# Patient Record
Sex: Female | Born: 1950
Health system: Southern US, Community
[De-identification: ages and names within clinical notes are randomized; demographics above are authoritative.]

## PROBLEM LIST (undated history)

## (undated) DIAGNOSIS — H269 Unspecified cataract: Secondary | ICD-10-CM

## (undated) DIAGNOSIS — F419 Anxiety disorder, unspecified: Secondary | ICD-10-CM

## (undated) DIAGNOSIS — Z860100 Personal history of colon polyps, unspecified: Secondary | ICD-10-CM

## (undated) DIAGNOSIS — H9190 Unspecified hearing loss, unspecified ear: Secondary | ICD-10-CM

## (undated) DIAGNOSIS — H9319 Tinnitus, unspecified ear: Secondary | ICD-10-CM

## (undated) DIAGNOSIS — G4700 Insomnia, unspecified: Secondary | ICD-10-CM

## (undated) DIAGNOSIS — T8859XA Other complications of anesthesia, initial encounter: Secondary | ICD-10-CM

## (undated) DIAGNOSIS — M2540 Effusion, unspecified joint: Secondary | ICD-10-CM

## (undated) DIAGNOSIS — H409 Unspecified glaucoma: Secondary | ICD-10-CM

## (undated) DIAGNOSIS — Z8719 Personal history of other diseases of the digestive system: Secondary | ICD-10-CM

## (undated) DIAGNOSIS — IMO0002 Reserved for concepts with insufficient information to code with codable children: Secondary | ICD-10-CM

## (undated) DIAGNOSIS — K219 Gastro-esophageal reflux disease without esophagitis: Secondary | ICD-10-CM

## (undated) DIAGNOSIS — Z9889 Other specified postprocedural states: Secondary | ICD-10-CM

## (undated) DIAGNOSIS — Z8709 Personal history of other diseases of the respiratory system: Secondary | ICD-10-CM

## (undated) DIAGNOSIS — R42 Dizziness and giddiness: Secondary | ICD-10-CM

## (undated) DIAGNOSIS — T4145XA Adverse effect of unspecified anesthetic, initial encounter: Secondary | ICD-10-CM

## (undated) DIAGNOSIS — M1990 Unspecified osteoarthritis, unspecified site: Secondary | ICD-10-CM

## (undated) DIAGNOSIS — M2550 Pain in unspecified joint: Secondary | ICD-10-CM

## (undated) DIAGNOSIS — D649 Anemia, unspecified: Secondary | ICD-10-CM

## (undated) DIAGNOSIS — R112 Nausea with vomiting, unspecified: Secondary | ICD-10-CM

## (undated) DIAGNOSIS — K5790 Diverticulosis of intestine, part unspecified, without perforation or abscess without bleeding: Secondary | ICD-10-CM

## (undated) DIAGNOSIS — F5104 Psychophysiologic insomnia: Secondary | ICD-10-CM

## (undated) DIAGNOSIS — I639 Cerebral infarction, unspecified: Secondary | ICD-10-CM

## (undated) DIAGNOSIS — K5900 Constipation, unspecified: Secondary | ICD-10-CM

## (undated) DIAGNOSIS — I10 Essential (primary) hypertension: Secondary | ICD-10-CM

## (undated) HISTORY — PX: OTHER SURGICAL HISTORY: SHX169

## (undated) HISTORY — PX: COLONOSCOPY: SHX174

## (undated) HISTORY — PX: ABDOMINAL HYSTERECTOMY: SHX81

## (undated) HISTORY — PX: APPENDECTOMY: SHX54

## (undated) HISTORY — DX: Psychophysiologic insomnia: F51.04

## (undated) HISTORY — PX: TUBAL LIGATION: SHX77

## (undated) HISTORY — DX: Reserved for concepts with insufficient information to code with codable children: IMO0002

## (undated) HISTORY — PX: TONSILLECTOMY: SUR1361

---

## 2002-07-11 ENCOUNTER — Encounter (INDEPENDENT_AMBULATORY_CARE_PROVIDER_SITE_OTHER): Payer: Self-pay | Admitting: Specialist

## 2002-07-11 ENCOUNTER — Ambulatory Visit (HOSPITAL_COMMUNITY): Admission: RE | Admit: 2002-07-11 | Discharge: 2002-07-11 | Payer: Self-pay | Admitting: Gastroenterology

## 2003-11-22 ENCOUNTER — Emergency Department (HOSPITAL_COMMUNITY): Admission: EM | Admit: 2003-11-22 | Discharge: 2003-11-22 | Payer: Self-pay | Admitting: Emergency Medicine

## 2007-01-23 ENCOUNTER — Encounter: Admission: RE | Admit: 2007-01-23 | Discharge: 2007-01-23 | Payer: Self-pay | Admitting: Sports Medicine

## 2008-10-25 ENCOUNTER — Encounter: Admission: RE | Admit: 2008-10-25 | Discharge: 2008-10-25 | Payer: Self-pay | Admitting: Internal Medicine

## 2009-07-29 ENCOUNTER — Emergency Department (HOSPITAL_COMMUNITY): Admission: EM | Admit: 2009-07-29 | Discharge: 2009-07-29 | Payer: Self-pay | Admitting: Emergency Medicine

## 2009-07-29 ENCOUNTER — Emergency Department (HOSPITAL_COMMUNITY): Admission: EM | Admit: 2009-07-29 | Discharge: 2009-07-30 | Payer: Self-pay | Admitting: Emergency Medicine

## 2010-02-15 ENCOUNTER — Emergency Department (HOSPITAL_COMMUNITY): Admission: EM | Admit: 2010-02-15 | Discharge: 2010-02-15 | Payer: Self-pay | Admitting: Emergency Medicine

## 2011-03-19 LAB — POCT I-STAT, CHEM 8
BUN: 12 mg/dL (ref 6–23)
Calcium, Ion: 1.14 mmol/L (ref 1.12–1.32)
Chloride: 106 mEq/L (ref 96–112)
Creatinine, Ser: 1 mg/dL (ref 0.4–1.2)
Glucose, Bld: 90 mg/dL (ref 70–99)
HCT: 39 % (ref 36.0–46.0)
Hemoglobin: 13.3 g/dL (ref 12.0–15.0)
Potassium: 4.1 mEq/L (ref 3.5–5.1)
Sodium: 139 mEq/L (ref 135–145)
TCO2: 28 mmol/L (ref 0–100)

## 2011-04-03 LAB — CBC
HCT: 37.9 % (ref 36.0–46.0)
Hemoglobin: 13 g/dL (ref 12.0–15.0)
MCHC: 34.2 g/dL (ref 30.0–36.0)
MCV: 86.4 fL (ref 78.0–100.0)
Platelets: 112 10*3/uL — ABNORMAL LOW (ref 150–400)
RBC: 4.39 MIL/uL (ref 3.87–5.11)
RDW: 13.1 % (ref 11.5–15.5)
WBC: 6.6 10*3/uL (ref 4.0–10.5)

## 2011-04-03 LAB — COMPREHENSIVE METABOLIC PANEL
ALT: 15 U/L (ref 0–35)
AST: 20 U/L (ref 0–37)
Albumin: 3.3 g/dL — ABNORMAL LOW (ref 3.5–5.2)
Alkaline Phosphatase: 88 U/L (ref 39–117)
BUN: 13 mg/dL (ref 6–23)
CO2: 27 mEq/L (ref 19–32)
Calcium: 9 mg/dL (ref 8.4–10.5)
Chloride: 109 mEq/L (ref 96–112)
Creatinine, Ser: 0.88 mg/dL (ref 0.4–1.2)
GFR calc Af Amer: >60 mL/min (ref >60)
GFR calc non Af Amer: >60 mL/min (ref >60)
Glucose, Bld: 97 mg/dL (ref 70–99)
Potassium: 3.8 mEq/L (ref 3.5–5.1)
Sodium: 141 mEq/L (ref 135–145)
Total Bilirubin: 0.4 mg/dL (ref 0.3–1.2)
Total Protein: 6.6 g/dL (ref 6.0–8.3)

## 2011-04-03 LAB — DIFFERENTIAL
Basophils Absolute: 0 10*3/uL (ref 0.0–0.1)
Basophils Relative: 1 % (ref 0–1)
Eosinophils Absolute: 0.1 10*3/uL (ref 0.0–0.7)
Eosinophils Relative: 2 % (ref 0–5)
Lymphocytes Relative: 32 % (ref 12–46)
Lymphs Abs: 2.1 10*3/uL (ref 0.7–4.0)
Monocytes Absolute: 0.5 10*3/uL (ref 0.1–1.0)
Monocytes Relative: 7 % (ref 3–12)
Neutro Abs: 3.9 10*3/uL (ref 1.7–7.7)
Neutrophils Relative %: 59 % (ref 43–77)

## 2011-04-03 LAB — POCT CARDIAC MARKERS
CKMB, poc: <1 ng/mL — ABNORMAL LOW (ref 1.0–8.0)
Myoglobin, poc: 60.2 ng/mL (ref 12–200)
Troponin i, poc: <0.05 ng/mL (ref 0.00–0.09)

## 2011-05-14 NOTE — Op Note (Signed)
[place]  Patient:    Gabrielle [name], Gabrielle [name] Visit Number: [id number] MRN: [id number]          Service Type: END Location: ENDO Attending Physician:  Charna Elizabeth Dictated by:   Anselmo Rod, M.D. Proc. Date: 07/11/02 Admit Date:  07/11/2002 Discharge Date: 07/11/2002   CC:         Cala Bradford R. Renae Gloss, M.D.   Operative Report  DATE OF BIRTH:  03-13-1951  PROCEDURE PERFORMED:  Colonoscopy with snare polypectomy x 2.  ENDOSCOPIST:  Anselmo Rod, M.D.  INSTRUMENT:  Olympus video colonoscope.  INDICATION FOR PROCEDURE:  A 60 year old African-American female undergoing screening colonoscopy.  The patients father has a history of colon cancer, and sister has had breast cancer.  Rule out colonic polyps.  PREPROCEDURE PREPARATION:  Informed consent was procured from the patient. The patient was fasting for eight hours prior to the procedure and prepped with a bottle of magnesium citrate and a gallon of NuLytely the night prior to the procedure.  PREPROCEDURE PHYSICAL:  VITAL SIGNS:  Stable.  NECK:  Supple.  CHEST:  Clear to auscultation.  HEART:  S1, S2 regular.  ABDOMEN:  Soft with normal bowel sounds.  DESCRIPTION OF THE PROCEDURE:  The patient was placed in the left lateral decubitus position and sedated with 100 mg of Demerol and 15 mg of Versed intravenously.  Once the patient was adequately sedated and maintained on low-flow oxygen and continuous cardiac monitoring, the Olympus video colonoscope was advanced from the rectum to the cecum with difficulty secondary to the patients large body habitus.  A 5 to 6 mm sessile polyp was snared at 10 cm.  Another polyp was snared from the same site which measured about 54 mm in size.  There were a few hyperplastic appearing polyps at 10 cm ablated with the tip of the snare.  Small internal hemorrhoids were seen on retroflexion in the rectum.  The patient tolerated the procedure  well without complication.  The procedure was completed at the cecum.  The appendical orifice and the cecal valve were clearly visualized and photographed.  IMPRESSION: 1. Small nonbleeding internal hemorrhoids. 2. A 5 to 6 mm sessile polyp snared at 10 cm. 3. Small sessile polyp measuring 2 to 4 mm snared at 10 cm as well. 4. A few hyperplastic appearing polyps ablated between 10 to 15 cm.  RECOMMENDATIONS: 1. Avoid all nonsteroidals including aspirin for now. 2. Await pathology results. 3. Outpatient followup in the next 7 to 10 days. Dictated by:   Anselmo Rod, M.D. Attending Physician:  Charna Elizabeth DD:  07/11/02 TD:  07/16/02 Job: [id number]

## 2012-02-05 ENCOUNTER — Telehealth: Payer: Self-pay | Admitting: Internal Medicine

## 2012-02-05 NOTE — Telephone Encounter (Signed)
Abd cramping last night after dinner, better after BM.  Initial stool was normal in color. Cramps continued throughout the night, then this am bright red per rectum About 3 episodes since this am. Has dicyclomine but she hasn't used it  No def hx of colitis.   ? Ischemic event diverticular bleeding, less likely IBD given the quick onset  I have advised ED visit today.  She can use dicyclomine for cramping. She voiced understanding and thanked me for the call.  I will fax note to Dr. Loreta Ave her primary GI MD

## 2012-02-07 NOTE — Telephone Encounter (Addendum)
Faxed note to Dr Kenna Gilbert ofc.

## 2012-06-13 ENCOUNTER — Emergency Department (HOSPITAL_COMMUNITY): Payer: No Typology Code available for payment source

## 2012-06-13 ENCOUNTER — Encounter (HOSPITAL_COMMUNITY): Payer: Self-pay | Admitting: Emergency Medicine

## 2012-06-13 ENCOUNTER — Emergency Department (HOSPITAL_COMMUNITY)
Admission: EM | Admit: 2012-06-13 | Discharge: 2012-06-14 | Disposition: A | Discharge status: Home / Self Care | Payer: No Typology Code available for payment source | Attending: Emergency Medicine | Admitting: Emergency Medicine

## 2012-06-13 DIAGNOSIS — S298XXA Other specified injuries of thorax, initial encounter: Secondary | ICD-10-CM | POA: Insufficient documentation

## 2012-06-13 DIAGNOSIS — S8000XA Contusion of unspecified knee, initial encounter: Secondary | ICD-10-CM | POA: Insufficient documentation

## 2012-06-13 DIAGNOSIS — Y998 Other external cause status: Secondary | ICD-10-CM | POA: Insufficient documentation

## 2012-06-13 DIAGNOSIS — S8001XA Contusion of right knee, initial encounter: Secondary | ICD-10-CM

## 2012-06-13 DIAGNOSIS — S8002XA Contusion of left knee, initial encounter: Secondary | ICD-10-CM

## 2012-06-13 DIAGNOSIS — R0789 Other chest pain: Secondary | ICD-10-CM

## 2012-06-13 DIAGNOSIS — Y93I9 Activity, other involving external motion: Secondary | ICD-10-CM | POA: Insufficient documentation

## 2012-06-13 HISTORY — DX: Essential (primary) hypertension: I10

## 2012-06-13 MED ORDER — CYCLOBENZAPRINE HCL 10 MG PO TABS: 10.0000 mg | ORAL_TABLET | Freq: Two times a day (BID) | ORAL | Status: AC | PRN

## 2012-06-13 MED ORDER — DIAZEPAM 5 MG PO TABS
5.0000 mg | ORAL_TABLET | Freq: Once | ORAL | Status: DC
  Filled 2012-06-13 (×2): qty 1

## 2012-06-13 MED ORDER — HYDROCODONE-ACETAMINOPHEN 5-325 MG PO TABS
1.0000 | ORAL_TABLET | Freq: Once | ORAL | Status: AC
  Administered 2012-06-13: 1 via ORAL
  Filled 2012-06-13 (×2): qty 1

## 2012-06-13 MED ORDER — HYDROCODONE-ACETAMINOPHEN 5-325 MG PO TABS: 1.0000 | ORAL_TABLET | ORAL | Status: AC | PRN
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
## 2012-06-13 NOTE — ED Notes (Signed)
Upon entering room for RN assessment, pt had c-collar on bed. Pt asked who removed c-collar, pt states "i did, i have panic attacks and it was bothering me". Pt informed of risk and benefits of having c-collar in place, pt verbalized understanding and c-collar was placed back on pt neck by RN. Family at bedside and plan of care is updated with verbal understanding.

## 2012-06-13 NOTE — Discharge Instructions (Signed)
Chest Wall Pain Chest wall pain is pain in or around the bones and muscles of your chest. It may take up to 6 weeks to get better. It may take longer if you must stay physically active in your work and activities.  CAUSES  Chest wall pain may happen on its own. However, it may be caused by:  A viral illness like the flu.   Injury.   Coughing.   Exercise.   Arthritis.   Fibromyalgia.   Shingles.  HOME CARE INSTRUCTIONS   Avoid overtiring physical activity. Try not to strain or perform activities that cause pain. This includes any activities using your chest or your abdominal and side muscles, especially if heavy weights are used.   Put ice on the sore area.   Put ice in a plastic bag.   Place a towel between your skin and the bag.   Leave the ice on for 15 to 20 minutes per hour while awake for the first 2 days.   Only take over-the-counter or prescription medicines for pain, discomfort, or fever as directed by your caregiver.  SEEK IMMEDIATE MEDICAL CARE IF:   Your pain increases, or you are very uncomfortable.   You have a fever.   Your chest pain becomes worse.   You have new, unexplained symptoms.   You have nausea or vomiting.   You feel sweaty or lightheaded.   You have a cough with phlegm (sputum), or you cough up blood.  MAKE SURE YOU:   Understand these instructions.   Will watch your condition.   Will get help right away if you are not doing well or get worse.  Document Released: 12/13/2005 Document Revised: 12/02/2011 Document Reviewed: 08/09/2011 [facility] Patient Information 2012 [place], [place].Contusion A contusion is a deep bruise. Contusions are the result of an injury that caused bleeding under the skin. The contusion may turn blue, purple, or yellow. Minor injuries will give you a painless contusion, but more severe contusions may stay painful and swollen for a few weeks.  CAUSES  A contusion is usually caused by a blow, trauma, or direct  force to an area of the body. SYMPTOMS   Swelling and redness of the injured area.   Bruising of the injured area.   Tenderness and soreness of the injured area.   Pain.  DIAGNOSIS  The diagnosis can be made by taking a history and physical exam. An X-ray, CT scan, or MRI may be needed to determine if there were any associated injuries, such as fractures. TREATMENT  Specific treatment will depend on what area of the body was injured. In general, the best treatment for a contusion is resting, icing, elevating, and applying cold compresses to the injured area. Over-the-counter medicines may also be recommended for pain control. Ask your caregiver what the best treatment is for your contusion. HOME CARE INSTRUCTIONS   Put ice on the injured area.   Put ice in a plastic bag.   Place a towel between your skin and the bag.   Leave the ice on for 15 to 20 minutes, 3 to 4 times a day.   Only take over-the-counter or prescription medicines for pain, discomfort, or fever as directed by your caregiver. Your caregiver may recommend avoiding anti-inflammatory medicines (aspirin, ibuprofen, and naproxen) for 48 hours because these medicines may increase bruising.   Rest the injured area.   If possible, elevate the injured area to reduce swelling.  SEEK IMMEDIATE MEDICAL CARE IF:   You have  increased bruising or swelling.   You have pain that is getting worse.   Your swelling or pain is not relieved with medicines.  MAKE SURE YOU:   Understand these instructions.   Will watch your condition.   Will get help right away if you are not doing well or get worse.  Document Released: 09/22/2005 Document Revised: 12/02/2011 Document Reviewed: 10/18/2011 [employer] Patient Information 2012 [place], [place].
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
## 2012-06-16 NOTE — ED Provider Notes (Signed)
Medical screening examination/treatment/procedure(s) were performed by non-physician practitioner and as supervising physician I was immediately available for consultation/collaboration.   Akylah Hascall, MD 06/16/12 1613 

## 2012-11-22 ENCOUNTER — Other Ambulatory Visit: Payer: Self-pay | Admitting: Internal Medicine

## 2012-11-22 DIAGNOSIS — H9319 Tinnitus, unspecified ear: Secondary | ICD-10-CM

## 2012-11-28 ENCOUNTER — Ambulatory Visit
Admission: RE | Admit: 2012-11-28 | Discharge: 2012-11-28 | Disposition: A | Discharge status: Home / Self Care | Payer: BC Managed Care – PPO | Source: Ambulatory Visit | Attending: Internal Medicine | Admitting: Internal Medicine

## 2012-11-28 DIAGNOSIS — H9319 Tinnitus, unspecified ear: Secondary | ICD-10-CM

## 2013-02-19 ENCOUNTER — Other Ambulatory Visit: Payer: Self-pay | Admitting: Otolaryngology

## 2013-02-19 DIAGNOSIS — H9311 Tinnitus, right ear: Secondary | ICD-10-CM

## 2013-02-20 ENCOUNTER — Ambulatory Visit
Admission: RE | Admit: 2013-02-20 | Discharge: 2013-02-20 | Disposition: A | Discharge status: Home / Self Care | Payer: BC Managed Care – PPO | Source: Ambulatory Visit | Attending: Otolaryngology | Admitting: Otolaryngology

## 2013-02-20 DIAGNOSIS — H9311 Tinnitus, right ear: Secondary | ICD-10-CM

## 2013-05-29 ENCOUNTER — Encounter (HOSPITAL_COMMUNITY): Payer: Self-pay | Admitting: Emergency Medicine

## 2013-05-29 ENCOUNTER — Emergency Department (HOSPITAL_COMMUNITY)
Admission: EM | Admit: 2013-05-29 | Discharge: 2013-05-29 | Disposition: A | Discharge status: Home / Self Care | Payer: BC Managed Care – PPO | Attending: Emergency Medicine | Admitting: Emergency Medicine

## 2013-05-29 ENCOUNTER — Emergency Department (HOSPITAL_COMMUNITY): Payer: BC Managed Care – PPO

## 2013-05-29 DIAGNOSIS — I10 Essential (primary) hypertension: Secondary | ICD-10-CM | POA: Insufficient documentation

## 2013-05-29 DIAGNOSIS — Y9289 Other specified places as the place of occurrence of the external cause: Secondary | ICD-10-CM | POA: Insufficient documentation

## 2013-05-29 DIAGNOSIS — M25561 Pain in right knee: Secondary | ICD-10-CM

## 2013-05-29 DIAGNOSIS — X500XXA Overexertion from strenuous movement or load, initial encounter: Secondary | ICD-10-CM | POA: Insufficient documentation

## 2013-05-29 DIAGNOSIS — Z79899 Other long term (current) drug therapy: Secondary | ICD-10-CM | POA: Insufficient documentation

## 2013-05-29 DIAGNOSIS — M129 Arthropathy, unspecified: Secondary | ICD-10-CM | POA: Insufficient documentation

## 2013-05-29 DIAGNOSIS — S8990XA Unspecified injury of unspecified lower leg, initial encounter: Secondary | ICD-10-CM | POA: Insufficient documentation

## 2013-05-29 DIAGNOSIS — Y9389 Activity, other specified: Secondary | ICD-10-CM | POA: Insufficient documentation

## 2013-05-29 DIAGNOSIS — M25559 Pain in unspecified hip: Secondary | ICD-10-CM | POA: Insufficient documentation

## 2013-05-29 DIAGNOSIS — G8929 Other chronic pain: Secondary | ICD-10-CM | POA: Insufficient documentation

## 2013-05-29 MED ORDER — DIAZEPAM 5 MG PO TABS: 5.0000 mg | ORAL_TABLET | Freq: Two times a day (BID) | ORAL | Status: DC

## 2013-05-29 MED ORDER — IBUPROFEN 800 MG PO TABS
800.0000 mg | ORAL_TABLET | Freq: Once | ORAL | Status: AC
  Administered 2013-05-29: 800 mg via ORAL
  Filled 2013-05-29: qty 1

## 2013-05-29 MED ORDER — TRAMADOL HCL 50 MG PO TABS: 50.0000 mg | ORAL_TABLET | Freq: Three times a day (TID) | ORAL | Status: DC | PRN

## 2013-05-29 NOTE — ED Notes (Addendum)
Pt states hx of arthritis in her knees and hips.  C/o right knee and left hip pain x 1 month.  States that she has gotten cortisone shots before.  Denies injury.

## 2013-05-29 NOTE — ED Provider Notes (Signed)
History     CSN: [id number]  Arrival date & time 05/29/13  0803   First MD Initiated Contact with Patient 05/29/13 0831      Chief Complaint  Patient presents with  . Arthritis  . Knee Pain  . Hip Pain    (Consider location/radiation/quality/duration/timing/severity/associated sxs/prior treatment) HPI  Patient presents with concern of pain in her right knee, left hip. She has a lengthy history of chronic pain in both knees, as well as recent in the left hip.  The left hip pain is lateral, superior, worse with ambulation, sore, improved with ibuprofen. The bilateral knee pain has been chronic for years, improved with interval steroid shots. Yesterday, the patient had an episode of the right knee giving way while she was standing.  Since that time she said pain diffusely about the knee, worse with weightbearing or ambulation.  The pain is marginally improved with ibuprofen. No distal dysesthesia or weakness.   Past Medical History  Diagnosis Date  . Hypertension     Past Surgical History  Procedure Laterality Date  . Abdominal hysterectomy      History reviewed. No pertinent family history.  History  Substance Use Topics  . Smoking status: Never Smoker   . Smokeless tobacco: Not on file  . Alcohol Use: No    OB History   Grav Para Term Preterm Abortions TAB SAB Ect Mult Living                  Review of Systems  Constitutional:       Per HPI, otherwise negative  HENT:       Patient has right ear tinnitus, for which she has seen multiple ENT physicians, is currently in process of receiving additional evaluation for this entity  Respiratory:       Per HPI, otherwise negative  Cardiovascular:       Per HPI, otherwise negative  Gastrointestinal: Negative for nausea.  Genitourinary:       No incontinence  Musculoskeletal:       Per HPI, otherwise negative  Skin: Negative.   Neurological: Negative for weakness.    Allergies  Contrast media  Home  Medications   Current Outpatient Rx  Name  Route  Sig  Dispense  Refill  . lisinopril (PRINIVIL,ZESTRIL) 20 MG tablet   Oral   Take 20 mg by mouth daily.           BP 125/61  Pulse 66  Temp(Src) 98.4 F (36.9 C) (Oral)  Resp 16  SpO2 100%  Physical Exam  Nursing note and vitals reviewed. Constitutional: She is oriented to person, place, and time. She appears well-developed and well-nourished. No distress.  HENT:  Head: Normocephalic and atraumatic.  Eyes: Conjunctivae and EOM are normal.  Cardiovascular: Normal rate, regular rhythm, intact distal pulses and normal pulses.   Pulmonary/Chest: Effort normal and breath sounds normal. No stridor. No respiratory distress.  Abdominal: She exhibits no distension.  Musculoskeletal: She exhibits no edema.       Right hip: Normal.       Left hip: She exhibits tenderness and bony tenderness. She exhibits normal range of motion, normal strength, no swelling, no crepitus, no deformity and no laceration.       Right knee: She exhibits decreased range of motion, swelling, effusion and bony tenderness. She exhibits no ecchymosis, no deformity, no laceration, no erythema, normal alignment, no LCL laxity, normal patellar mobility, normal meniscus and no MCL laxity. Tenderness found. Medial joint  line and lateral joint line tenderness noted. No MCL, no LCL and no patellar tendon tenderness noted.       Left knee: Normal.       Right ankle: Normal.       Left ankle: Normal.  Neurological: She is alert and oriented to person, place, and time. No cranial nerve deficit.  Skin: Skin is warm and dry.  Psychiatric: She has a normal mood and affect.    ED Course  Procedures (including critical care time)  Labs Reviewed - No data to display No results found.   No diagnosis found.  We evaluated the x-rays together, interpreted.  No acute fracture, but degenerative changes. MDM  Patient presents with ongoing left hip pain, worsening right knee  pain following the knee giving way yesterday. On exam the patient is neurovascularly intact, though she is tenderness to palpation about the knee and hip.  X-rays are largely reassuring.  The patient has an orthopedist with whom she may follow up. The patient was discharged in stable condition with initiation of an analgesic regimen for a short time while she follows up with her physicians.    Gerhard Munch, MD 05/29/13 [phone]

## 2013-09-21 ENCOUNTER — Other Ambulatory Visit: Payer: Self-pay | Admitting: Sports Medicine

## 2013-09-21 DIAGNOSIS — M25561 Pain in right knee: Secondary | ICD-10-CM

## 2013-09-30 ENCOUNTER — Ambulatory Visit
Admission: RE | Admit: 2013-09-30 | Discharge: 2013-09-30 | Disposition: A | Discharge status: Home / Self Care | Payer: BC Managed Care – PPO | Source: Ambulatory Visit | Attending: Sports Medicine | Admitting: Sports Medicine

## 2013-09-30 DIAGNOSIS — M25561 Pain in right knee: Secondary | ICD-10-CM

## 2013-10-03 LAB — HM COLONOSCOPY

## 2013-10-04 ENCOUNTER — Other Ambulatory Visit: Payer: BC Managed Care – PPO

## 2013-11-27 DIAGNOSIS — H903 Sensorineural hearing loss, bilateral: Secondary | ICD-10-CM | POA: Insufficient documentation

## 2014-01-28 ENCOUNTER — Telehealth: Payer: Self-pay | Admitting: Diagnostic Neuroimaging

## 2014-01-28 NOTE — Telephone Encounter (Signed)
Spoke with patient and she said that she has had the tinnitus surgery(01/04/12 in [place]) did not help with the clicking in the ears-made it worse, now  having a buzzing in her forehead since surgery. What would be his recommendations at this point?  LOV was 01/30/13

## 2014-01-28 NOTE — Telephone Encounter (Signed)
NEEDS TO DISCUSS MRI RESULTS

## 2014-02-12 ENCOUNTER — Emergency Department (HOSPITAL_COMMUNITY)
Admission: EM | Admit: 2014-02-12 | Discharge: 2014-02-12 | Disposition: A | Discharge status: Home / Self Care | Payer: BC Managed Care – PPO | Source: Home / Self Care

## 2014-02-12 ENCOUNTER — Encounter (HOSPITAL_COMMUNITY): Payer: Self-pay | Admitting: Emergency Medicine

## 2014-02-12 DIAGNOSIS — H6980 Other specified disorders of Eustachian tube, unspecified ear: Secondary | ICD-10-CM

## 2014-02-12 DIAGNOSIS — J029 Acute pharyngitis, unspecified: Secondary | ICD-10-CM

## 2014-02-12 DIAGNOSIS — J069 Acute upper respiratory infection, unspecified: Secondary | ICD-10-CM

## 2014-02-12 LAB — POCT RAPID STREP A: Streptococcus, Group A Screen (Direct): NEGATIVE

## 2014-02-12 NOTE — ED Notes (Signed)
pT  HAS  SYMPTOMS  OF  SORETHROAT  WITH  BODY  ACHES  /  CHILLS        FOR SEV  DAYS

## 2014-02-12 NOTE — Discharge Instructions (Signed)
Pharyngitis °Pharyngitis is redness, pain, and swelling (inflammation) of your pharynx.  °CAUSES  °Pharyngitis is usually caused by infection. Most of the time, these infections are from viruses (viral) and are part of a cold. However, sometimes pharyngitis is caused by bacteria (bacterial). Pharyngitis can also be caused by allergies. Viral pharyngitis may be spread from person to person by coughing, sneezing, and personal items or utensils (cups, forks, spoons, toothbrushes). Bacterial pharyngitis may be spread from person to person by more intimate contact, such as kissing.  °SIGNS AND SYMPTOMS  °Symptoms of pharyngitis include:   °· Sore throat.   °· Tiredness (fatigue).   °· Low-grade fever.   °· Headache. °· Joint pain and muscle aches. °· Skin rashes. °· Swollen lymph nodes. °· Plaque-like film on throat or tonsils (often seen with bacterial pharyngitis). °DIAGNOSIS  °Your health care provider will ask you questions about your illness and your symptoms. Your medical history, along with a physical exam, is often all that is needed to diagnose pharyngitis. Sometimes, a rapid strep test is done. Other lab tests may also be done, depending on the suspected cause.  °TREATMENT  °Viral pharyngitis will usually get better in 3 4 days without the use of medicine. Bacterial pharyngitis is treated with medicines that kill germs (antibiotics).  °HOME CARE INSTRUCTIONS  °· Drink enough water and fluids to keep your urine clear or pale yellow.   °· Only take over-the-counter or prescription medicines as directed by your health care provider:   °· If you are prescribed antibiotics, make sure you finish them even if you start to feel better.   °· Do not take aspirin.   °· Get lots of rest.   °· Gargle with 8 oz of salt water (½ tsp of salt per 1 qt of water) as often as every 1 2 hours to soothe your throat.   °· Throat lozenges (if you are not at risk for choking) or sprays may be used to soothe your throat. °SEEK MEDICAL  CARE IF:  °· You have large, tender lumps in your neck. °· You have a rash. °· You cough up green, yellow-brown, or bloody spit. °SEEK IMMEDIATE MEDICAL CARE IF:  °· Your neck becomes stiff. °· You drool or are unable to swallow liquids. °· You vomit or are unable to keep medicines or liquids down. °· You have severe pain that does not go away with the use of recommended medicines. °· You have trouble breathing (not caused by a stuffy nose). °MAKE SURE YOU:  °· Understand these instructions. °· Will watch your condition. °· Will get help right away if you are not doing well or get worse. °Document Released: 12/13/2005 Document Revised: 10/03/2013 Document Reviewed: 08/20/2013 °[employer]® Patient Information ©2014 [employer]. ° °Sore Throat °A sore throat is pain, burning, irritation, or scratchiness of the throat. There is often pain or tenderness when swallowing or talking. A sore throat may be accompanied by other symptoms, such as coughing, sneezing, fever, and swollen neck glands. A sore throat is often the first sign of another sickness, such as a cold, flu, strep throat, or mononucleosis (commonly known as mono). Most sore throats go away without medical treatment. °CAUSES  °The most common causes of a sore throat include: °· A viral infection, such as a cold, flu, or mono. °· A bacterial infection, such as strep throat, tonsillitis, or whooping cough. °· Seasonal allergies. °· Dryness in the air. °· Irritants, such as smoke or pollution. °· Gastroesophageal reflux disease (GERD). °HOME CARE INSTRUCTIONS  °· Only take over-the-counter   medicines as directed by your caregiver.  Drink enough fluids to keep your urine clear or pale yellow.  Rest as needed.  Try using throat sprays, lozenges, or sucking on hard candy to ease any pain (if older than 4 years or as directed).  Sip warm liquids, such as broth, herbal tea, or warm water with honey to relieve pain temporarily. You may also eat or drink cold or  frozen liquids such as frozen ice pops.  Gargle with salt water (mix 1 tsp salt with 8 oz of water).  Do not smoke and avoid secondhand smoke.  Put a cool-mist humidifier in your bedroom at night to moisten the air. You can also turn on a hot shower and sit in the bathroom with the door closed for 5 10 minutes. SEEK IMMEDIATE MEDICAL CARE IF:  You have difficulty breathing.  You are unable to swallow fluids, soft foods, or your saliva.  You have increased swelling in the throat.  Your sore throat does not get better in 7 days.  You have nausea and vomiting.  You have a fever or persistent symptoms for more than 2 3 days.  You have a fever and your symptoms suddenly get worse. MAKE SURE YOU:   Understand these instructions.  Will watch your condition.  Will get help right away if you are not doing well or get worse. Document Released: 01/20/2005 Document Revised: 11/29/2012 Document Reviewed: 08/20/2012 [facility] Patient Information 2014 [place], [place].  Upper Respiratory Infection, Adult An upper respiratory infection (URI) is also sometimes known as the common cold. The upper respiratory tract includes the nose, sinuses, throat, trachea, and bronchi. Bronchi are the airways leading to the lungs. Most people improve within 1 week, but symptoms can last up to 2 weeks. A residual cough may last even longer.  CAUSES Many different viruses can infect the tissues lining the upper respiratory tract. The tissues become irritated and inflamed and often become very moist. Mucus production is also common. A cold is contagious. You can easily spread the virus to others by oral contact. This includes kissing, sharing a glass, coughing, or sneezing. Touching your mouth or nose and then touching a surface, which is then touched by another person, can also spread the virus. SYMPTOMS  Symptoms typically develop 1 to 3 days after you come in contact with a cold virus. Symptoms vary from person to  person. They may include:  Runny nose.  Sneezing.  Nasal congestion.  Sinus irritation.  Sore throat.  Loss of voice (laryngitis).  Cough.  Fatigue.  Muscle aches.  Loss of appetite.  Headache.  Low-grade fever. DIAGNOSIS  You might diagnose your own cold based on familiar symptoms, since most people get a cold 2 to 3 times a year. Your caregiver can confirm this based on your exam. Most importantly, your caregiver can check that your symptoms are not due to another disease such as strep throat, sinusitis, pneumonia, asthma, or epiglottitis. Blood tests, throat tests, and X-rays are not necessary to diagnose a common cold, but they may sometimes be helpful in excluding other more serious diseases. Your caregiver will decide if any further tests are required. RISKS AND COMPLICATIONS  You may be at risk for a more severe case of the common cold if you smoke cigarettes, have chronic heart disease (such as heart failure) or lung disease (such as asthma), or if you have a weakened immune system. The very young and very old are also at risk for more serious infections.  Bacterial sinusitis, middle ear infections, and bacterial pneumonia can complicate the common cold. The common cold can worsen asthma and chronic obstructive pulmonary disease (COPD). Sometimes, these complications can require emergency medical care and may be life-threatening. PREVENTION  The best way to protect against getting a cold is to practice good hygiene. Avoid oral or hand contact with people with cold symptoms. Wash your hands often if contact occurs. There is no clear evidence that vitamin C, vitamin E, echinacea, or exercise reduces the chance of developing a cold. However, it is always recommended to get plenty of rest and practice good nutrition. TREATMENT  Treatment is directed at relieving symptoms. There is no cure. Antibiotics are not effective, because the infection is caused by a virus, not by bacteria.  Treatment may include:  Increased fluid intake. Sports drinks offer valuable electrolytes, sugars, and fluids.  Breathing heated mist or steam (vaporizer or shower).  Eating chicken soup or other clear broths, and maintaining good nutrition.  Getting plenty of rest.  Using gargles or lozenges for comfort.  Controlling fevers with ibuprofen or acetaminophen as directed by your caregiver.  Increasing usage of your inhaler if you have asthma. Zinc gel and zinc lozenges, taken in the first 24 hours of the common cold, can shorten the duration and lessen the severity of symptoms. Pain medicines may help with fever, muscle aches, and throat pain. A variety of non-prescription medicines are available to treat congestion and runny nose. Your caregiver can make recommendations and may suggest nasal or lung inhalers for other symptoms.  HOME CARE INSTRUCTIONS   Only take over-the-counter or prescription medicines for pain, discomfort, or fever as directed by your caregiver.  Use a warm mist humidifier or inhale steam from a shower to increase air moisture. This may keep secretions moist and make it easier to breathe.  Drink enough water and fluids to keep your urine clear or pale yellow.  Rest as needed.  Return to work when your temperature has returned to normal or as your caregiver advises. You may need to stay home longer to avoid infecting others. You can also use a face mask and careful hand washing to prevent spread of the virus. SEEK MEDICAL CARE IF:   After the first few days, you feel you are getting worse rather than better.  You need your caregiver's advice about medicines to control symptoms.  You develop chills, worsening shortness of breath, or brown or red sputum. These may be signs of pneumonia.  You develop yellow or brown nasal discharge or pain in the face, especially when you bend forward. These may be signs of sinusitis.  You develop a fever, swollen neck glands, pain  with swallowing, or white areas in the back of your throat. These may be signs of strep throat. SEEK IMMEDIATE MEDICAL CARE IF:   You have a fever.  You develop severe or persistent headache, ear pain, sinus pain, or chest pain.  You develop wheezing, a prolonged cough, cough up blood, or have a change in your usual mucus (if you have chronic lung disease).  You develop sore muscles or a stiff neck. Document Released: 06/08/2001 Document Revised: 03/06/2012 Document Reviewed: 04/16/2011 [facility] Patient Information 2014 [place], [place].

## 2014-02-12 NOTE — ED Provider Notes (Signed)
CSN: [id number]     Arrival date & time 02/12/14  1238 History   First MD Initiated Contact with Patient 02/12/14 1333     Chief Complaint  Patient presents with  . Sore Throat     (Consider location/radiation/quality/duration/timing/severity/associated sxs/prior Treatment) HPI Comments: 63 y o f with sore throat, body aches, general weakness, PND, stuffy nose. No fever. Not taking meds for sx's    Past Medical History  Diagnosis Date  . Hypertension    Past Surgical History  Procedure Laterality Date  . Abdominal hysterectomy     History reviewed. No pertinent family history. History  Substance Use Topics  . Smoking status: Never Smoker   . Smokeless tobacco: Not on file  . Alcohol Use: No   OB History   Grav Para Term Preterm Abortions TAB SAB Ect Mult Living                 Review of Systems  Constitutional: Positive for activity change and fatigue. Negative for fever.  HENT: Positive for congestion, postnasal drip, rhinorrhea and sore throat. Negative for ear pain.   Respiratory: Positive for cough. Negative for shortness of breath.   Gastrointestinal: Negative.   Genitourinary: Negative.   Musculoskeletal: Negative.   Skin: Negative for rash.      Allergies  Contrast media  Home Medications   Current Outpatient Rx  Name  Route  Sig  Dispense  Refill  . diazepam (VALIUM) 5 MG tablet   Oral   Take 1 tablet (5 mg total) by mouth 2 (two) times daily.   6 tablet   0   . lisinopril (PRINIVIL,ZESTRIL) 20 MG tablet   Oral   Take 20 mg by mouth daily.         . traMADol (ULTRAM) 50 MG tablet   Oral   Take 1 tablet (50 mg total) by mouth every 8 (eight) hours as needed for pain.   15 tablet   0    BP 128/68  Pulse 78  Temp(Src) 98.6 F (37 C) (Oral)  Resp 16  SpO2 100% Physical Exam  Nursing note and vitals reviewed. Constitutional: She is oriented to person, place, and time. She appears well-developed and well-nourished. No distress.  HENT:   L TM retracted R tm obscured with cerumen. Op with minor erythema  Eyes: Conjunctivae and EOM are normal.  Neck: Normal range of motion. Neck supple.  Cardiovascular: Normal rate, regular rhythm and normal heart sounds.   Pulmonary/Chest: Effort normal and breath sounds normal. No respiratory distress. She has no wheezes.  Lymphadenopathy:    She has no cervical adenopathy.  Neurological: She is alert and oriented to person, place, and time. She exhibits normal muscle tone.  Skin: Skin is warm and dry.  Psychiatric: She has a normal mood and affect.    ED Course  Procedures (including critical care time) Labs Review Labs Reviewed  POCT RAPID STREP A ([facility] URG CARE ONLY)   Imaging Review No results found.    MDM   Final diagnoses:  URI (upper respiratory infection)  ETD (eustachian tube dysfunction)  Pharyngitis      OTC meds, ibuprofen, allegra , sudafed PE 10 mg. Fluids, rest.  Janne Napoleon, NP 02/12/14 1357

## 2014-02-12 NOTE — Telephone Encounter (Signed)
MRI from 2014 results were reviewed at that time. If she wants to discuss new issues, may offer follow up visit with me or Jeani Hawking. Otherwise, follow up with ENT and PCP. See notes in [facility] EMR. -VRP

## 2014-02-12 NOTE — Telephone Encounter (Signed)
Left detailed message per Dr. Gladstone Lighter previsous note on vmail that ID's patient's first and last name.

## 2014-02-13 NOTE — ED Provider Notes (Signed)
Medical screening examination/treatment/procedure(s) were performed by resident physician or non-physician practitioner and as supervising physician I was immediately available for consultation/collaboration.   Pauline Good MD.   Billy Fischer, MD 02/13/14 2002

## 2014-02-14 LAB — CULTURE, GROUP A STREP

## 2014-02-26 ENCOUNTER — Other Ambulatory Visit: Payer: Self-pay | Admitting: Physician Assistant

## 2014-02-26 NOTE — H&P (Signed)
TOTAL KNEE ADMISSION H&P  Patient is being admitted for right total knee arthroplasty.  Subjective:  Chief Complaint:right knee pain.  HPI: Gabrielle [name], 63 y.o. female, has a history of pain and functional disability in the right knee due to arthritis and has failed non-surgical conservative treatments for greater than 12 weeks to includeNSAID's and/or analgesics, corticosteriod injections, viscosupplementation injections and activity modification.  Onset of symptoms was gradual, starting 4 years ago with gradually worsening course since that time. The patient noted no past surgery on the right knee(s).  Patient currently rates pain in the right knee(s) at 2 out of 10 with activity. Patient has worsening of pain with activity and weight bearing, pain that interferes with activities of daily living and joint swelling.  Patient has evidence of periarticular osteophytes and joint space narrowing by imaging studies. There is no active infection.  There are no active problems to display for this patient.  Past Medical History  Diagnosis Date  . Hypertension     Past Surgical History  Procedure Laterality Date  . Abdominal hysterectomy       (Not in a hospital admission) Allergies  Allergen Reactions  . Contrast Media [Iodinated Diagnostic Agents] Hives    History  Substance Use Topics  . Smoking status: Never Smoker   . Smokeless tobacco: Not on file  . Alcohol Use: No    No family history on file.   Review of Systems  Constitutional: Negative.   HENT: Positive for hearing loss and tinnitus. Negative for nosebleeds.   Eyes: Negative.   Respiratory: Negative.   Cardiovascular: Negative.   Gastrointestinal: Negative.   Genitourinary: Negative.   Musculoskeletal: Positive for joint pain.  Skin: Negative.   Neurological: Positive for dizziness. Negative for tingling, tremors and headaches.  Endo/Heme/Allergies: Bruises/bleeds easily.  Psychiatric/Behavioral: Positive for  depression. Negative for suicidal ideas. The patient is nervous/anxious and has insomnia.     Objective:  Physical Exam  Constitutional: She is oriented to person, place, and time. She appears well-developed and well-nourished.  HENT:  Head: Normocephalic and atraumatic.  Eyes: EOM are normal. Pupils are equal, round, and reactive to light.  Neck: Normal range of motion. Neck supple.  Cardiovascular: Normal rate and regular rhythm.  Exam reveals no gallop and no friction rub.   No murmur heard. Respiratory: Effort normal and breath sounds normal. No respiratory distress. She has no wheezes. She has no rales.  GI: Soft. Bowel sounds are normal. She exhibits no distension. There is no tenderness.  Musculoskeletal:  antalgic gait on the right where she has a little bit of varus.  She lacks full extension by 5 degrees, relatively abrupt end point.  Flexion to about 100.  Grade IV crepitus patellofemoral joint, a little bit lesser extent medial compartment.  Stable ligaments.  Some atrophy in the leg, not too extreme.  Neurovascularly intact distally.  Neurological: She is alert and oriented to person, place, and time.  Skin: Skin is warm and dry.  Psychiatric: She has a normal mood and affect. Her behavior is normal. Judgment and thought content normal.    Vital signs in last 24 hours: @VSRANGES@  Labs:   There is no height or weight on file to calculate BMI.   Imaging Review Plain radiographs demonstrate severe degenerative joint disease of the right knee(s). The overall alignment ismild varus. The bone quality appears to be fair for age and reported activity level.  Assessment/Plan:  End stage arthritis, right knee   The patient   history, physical examination, clinical judgment of the provider and imaging studies are consistent with end stage degenerative joint disease of the right knee(s) and total knee arthroplasty is deemed medically necessary. The treatment options including  medical management, injection therapy arthroscopy and arthroplasty were discussed at length. The risks and benefits of total knee arthroplasty were presented and reviewed. The risks due to aseptic loosening, infection, stiffness, patella tracking problems, thromboembolic complications and other imponderables were discussed. The patient acknowledged the explanation, agreed to proceed with the plan and consent was signed. Patient is being admitted for inpatient treatment for surgery, pain control, PT, OT, prophylactic antibiotics, VTE prophylaxis, progressive ambulation and ADL's and discharge planning. The patient is planning to be discharged to skilled nursing facility

## 2014-03-04 NOTE — Pre-Procedure Instructions (Signed)
Gabrielle [name]  03/04/2014   Your procedure is scheduled on:  Wed, Mar 18 @ 11:15 AM  Report to [facility] Stay Entrance A  at 8:15 AM.  Call this number if you have problems the morning of surgery: [phone]   Remember:   Do not eat food or drink liquids after midnight.   Take these medicines the morning of surgery with A SIP OF WATER: Alprazolam(Xanax),Omeprazole(Prilosec),and Phenergan(Promethazine-if needed)               Stop taking your Naproxen. No Goody's,BC's,Aspirin,Fish Oil,or any Herbal Medications   Do not wear jewelry, make-up or nail polish.  Do not wear lotions, powders, or perfumes. You may wear deodorant.  Do not shave 48 hours prior to surgery.   Do not bring valuables to the hospital.  [facility] is not responsible                  for any belongings or valuables.               Contacts, dentures or bridgework may not be worn into surgery.  Leave suitcase in the car. After surgery it may be brought to your room.  For patients admitted to the hospital, discharge time is determined by your                treatment team.               Special Instructions:  [place] - Preparing for Surgery  Before surgery, you can play an important role.  Because skin is not sterile, your skin needs to be as free of germs as possible.  You can reduce the number of germs on you skin by washing with CHG (chlorahexidine gluconate) soap before surgery.  CHG is an antiseptic cleaner which kills germs and bonds with the skin to continue killing germs even after washing.  Please DO NOT use if you have an allergy to CHG or antibacterial soaps.  If your skin becomes reddened/irritated stop using the CHG and inform your nurse when you arrive at Short Stay.  Do not shave (including legs and underarms) for at least 48 hours prior to the first CHG shower.  You may shave your face.  Please follow these instructions carefully:   1.  Shower with CHG Soap the night before surgery and the                                 morning of Surgery.  2.  If you choose to wash your hair, wash your hair first as usual with your       normal shampoo.  3.  After you shampoo, rinse your hair and body thoroughly to remove the                      Shampoo.  4.  Use CHG as you would any other liquid soap.  You can apply chg directly       to the skin and wash gently with scrungie or a clean washcloth.  5.  Apply the CHG Soap to your body ONLY FROM THE NECK DOWN.        Do not use on open wounds or open sores.  Avoid contact with your eyes,       ears, mouth and genitals (private parts).  Wash genitals (private parts)  with your normal soap.  6.  Wash thoroughly, paying special attention to the area where your surgery        will be performed.  7.  Thoroughly rinse your body with warm water from the neck down.  8.  DO NOT shower/wash with your normal soap after using and rinsing off       the CHG Soap.  9.  Pat yourself dry with a clean towel.            10.  Wear clean pajamas.            11.  Place clean sheets on your bed the night of your first shower and do not        sleep with pets.  Day of Surgery  Do not apply any lotions/deoderants the morning of surgery.  Please wear clean clothes to the hospital/surgery center.     Please read over the following fact sheets that you were given: Pain Booklet, Coughing and Deep Breathing, Blood Transfusion Information, MRSA Information and Surgical Site Infection Prevention

## 2014-03-05 ENCOUNTER — Encounter (HOSPITAL_COMMUNITY)
Admission: RE | Admit: 2014-03-05 | Discharge: 2014-03-05 | Disposition: A | Discharge status: Home / Self Care | Payer: BC Managed Care – PPO | Source: Ambulatory Visit | Attending: Orthopedic Surgery | Admitting: Orthopedic Surgery

## 2014-03-05 ENCOUNTER — Ambulatory Visit (HOSPITAL_COMMUNITY)
Admission: RE | Admit: 2014-03-05 | Discharge: 2014-03-05 | Disposition: A | Discharge status: Home / Self Care | Payer: BC Managed Care – PPO | Source: Ambulatory Visit | Attending: Physician Assistant | Admitting: Physician Assistant

## 2014-03-05 ENCOUNTER — Encounter (HOSPITAL_COMMUNITY): Payer: Self-pay

## 2014-03-05 DIAGNOSIS — Z87891 Personal history of nicotine dependence: Secondary | ICD-10-CM | POA: Insufficient documentation

## 2014-03-05 DIAGNOSIS — Z01812 Encounter for preprocedural laboratory examination: Secondary | ICD-10-CM | POA: Insufficient documentation

## 2014-03-05 DIAGNOSIS — Z01818 Encounter for other preprocedural examination: Secondary | ICD-10-CM | POA: Insufficient documentation

## 2014-03-05 DIAGNOSIS — I10 Essential (primary) hypertension: Secondary | ICD-10-CM | POA: Insufficient documentation

## 2014-03-05 DIAGNOSIS — J40 Bronchitis, not specified as acute or chronic: Secondary | ICD-10-CM | POA: Insufficient documentation

## 2014-03-05 HISTORY — DX: Dizziness and giddiness: R42

## 2014-03-05 HISTORY — DX: Personal history of other diseases of the digestive system: Z87.19

## 2014-03-05 HISTORY — DX: Effusion, unspecified joint: M25.40

## 2014-03-05 HISTORY — DX: Unspecified glaucoma: H40.9

## 2014-03-05 HISTORY — DX: Gastro-esophageal reflux disease without esophagitis: K21.9

## 2014-03-05 HISTORY — DX: Unspecified hearing loss, unspecified ear: H91.90

## 2014-03-05 HISTORY — DX: Constipation, unspecified: K59.00

## 2014-03-05 HISTORY — DX: Personal history of other diseases of the respiratory system: Z87.09

## 2014-03-05 HISTORY — DX: Anxiety disorder, unspecified: F41.9

## 2014-03-05 HISTORY — DX: Unspecified cataract: H26.9

## 2014-03-05 HISTORY — DX: Personal history of colon polyps, unspecified: Z86.0100

## 2014-03-05 HISTORY — DX: Insomnia, unspecified: G47.00

## 2014-03-05 HISTORY — DX: Pain in unspecified joint: M25.50

## 2014-03-05 HISTORY — DX: Tinnitus, unspecified ear: H93.19

## 2014-03-05 HISTORY — DX: Personal history of colonic polyps: Z86.010

## 2014-03-05 HISTORY — DX: Anemia, unspecified: D64.9

## 2014-03-05 HISTORY — DX: Unspecified osteoarthritis, unspecified site: M19.90

## 2014-03-05 HISTORY — DX: Diverticulosis of intestine, part unspecified, without perforation or abscess without bleeding: K57.90

## 2014-03-05 LAB — CBC WITH DIFFERENTIAL/PLATELET
BASOS PCT: 1 % (ref 0–1)
Basophils Absolute: 0 10*3/uL (ref 0.0–0.1)
EOS ABS: 0.1 10*3/uL (ref 0.0–0.7)
EOS PCT: 2 % (ref 0–5)
HCT: 38.9 % (ref 36.0–46.0)
HEMOGLOBIN: 13.2 g/dL (ref 12.0–15.0)
Lymphocytes Relative: 35 % (ref 12–46)
Lymphs Abs: 2 10*3/uL (ref 0.7–4.0)
MCH: 29.9 pg (ref 26.0–34.0)
MCHC: 33.9 g/dL (ref 30.0–36.0)
MCV: 88 fL (ref 78.0–100.0)
MONO ABS: 0.4 10*3/uL (ref 0.1–1.0)
MONOS PCT: 7 % (ref 3–12)
NEUTROS ABS: 3.2 10*3/uL (ref 1.7–7.7)
Neutrophils Relative %: 56 % (ref 43–77)
Platelets: 113 10*3/uL — ABNORMAL LOW (ref 150–400)
RBC: 4.42 MIL/uL (ref 3.87–5.11)
RDW: 13.4 % (ref 11.5–15.5)
WBC: 5.8 10*3/uL (ref 4.0–10.5)

## 2014-03-05 LAB — TYPE AND SCREEN
ABO/RH(D): O POS
ANTIBODY SCREEN: NEGATIVE

## 2014-03-05 LAB — URINALYSIS, ROUTINE W REFLEX MICROSCOPIC
Bilirubin Urine: NEGATIVE
GLUCOSE, UA: NEGATIVE mg/dL
Hgb urine dipstick: NEGATIVE
Ketones, ur: NEGATIVE mg/dL
Nitrite: NEGATIVE
Protein, ur: NEGATIVE mg/dL
SPECIFIC GRAVITY, URINE: 1.023 (ref 1.005–1.030)
Urobilinogen, UA: 0.2 mg/dL (ref 0.0–1.0)
pH: 5.5 (ref 5.0–8.0)

## 2014-03-05 LAB — SURGICAL PCR SCREEN
MRSA, PCR: NEGATIVE
Staphylococcus aureus: NEGATIVE

## 2014-03-05 LAB — COMPREHENSIVE METABOLIC PANEL
ALBUMIN: 3.3 g/dL — AB (ref 3.5–5.2)
ALT: 43 U/L — ABNORMAL HIGH (ref 0–35)
AST: 32 U/L (ref 0–37)
Alkaline Phosphatase: 121 U/L — ABNORMAL HIGH (ref 39–117)
BUN: 13 mg/dL (ref 6–23)
CALCIUM: 9 mg/dL (ref 8.4–10.5)
CO2: 26 mEq/L (ref 19–32)
CREATININE: 0.82 mg/dL (ref 0.50–1.10)
Chloride: 105 mEq/L (ref 96–112)
GFR calc Af Amer: 87 mL/min — ABNORMAL LOW (ref >90)
GFR calc non Af Amer: 75 mL/min — ABNORMAL LOW (ref >90)
Glucose, Bld: 76 mg/dL (ref 70–99)
Potassium: 4 mEq/L (ref 3.7–5.3)
Sodium: 144 mEq/L (ref 137–147)
TOTAL PROTEIN: 6.6 g/dL (ref 6.0–8.3)
Total Bilirubin: 0.3 mg/dL (ref 0.3–1.2)

## 2014-03-05 LAB — ABO/RH: ABO/RH(D): O POS

## 2014-03-05 LAB — URINE MICROSCOPIC-ADD ON

## 2014-03-05 LAB — PROTIME-INR
INR: 1.03 (ref 0.00–1.49)
PROTHROMBIN TIME: 13.3 s (ref 11.6–15.2)

## 2014-03-05 LAB — APTT: APTT: 32 s (ref 24–37)

## 2014-03-05 MED ORDER — CHLORHEXIDINE GLUCONATE 4 % EX LIQD: 60.0000 mL | Freq: Once | CUTANEOUS | Status: DC

## 2014-03-05 NOTE — Progress Notes (Addendum)
  Pt doesn't have a cardiologist  Denies ever having an echo/stress test/heart cath  Denies EKG or CXR in past yr   Medical Md is Dr. Willey Blade

## 2014-03-06 LAB — URINE CULTURE: Colony Count: 1000

## 2014-03-06 NOTE — Progress Notes (Signed)
Anesthesia Chart Review: Patient is a 63 year old female scheduled for right TKR by Dr. Kathryne Hitch on 03/13/2014. History reviewed and includes hypertension, former smoker, anemia, glaucoma, anxiety, GERD, hearing loss. PCP is Dr. Willey Blade. Preoperative EKG, chest x-ray, and labs noted. Anticipate patient can proceed as planned.  George Hugh [facility] Short Stay Center/Anesthesiology Phone [phone] 03/06/2014 1:45 PM

## 2014-03-12 MED ORDER — CEFAZOLIN SODIUM-DEXTROSE 2-3 GM-% IV SOLR
2.0000 g | INTRAVENOUS | Status: AC
  Administered 2014-03-13: 2 g via INTRAVENOUS

## 2014-03-13 ENCOUNTER — Encounter (HOSPITAL_COMMUNITY): Payer: BC Managed Care – PPO | Admitting: Vascular Surgery

## 2014-03-13 ENCOUNTER — Inpatient Hospital Stay (HOSPITAL_COMMUNITY)
Admission: RE | Admit: 2014-03-13 | Discharge: 2014-03-17 | DRG: 470 | Disposition: A | Discharge status: Home Health | Payer: BC Managed Care – PPO | Source: Ambulatory Visit | Attending: Orthopedic Surgery | Admitting: Orthopedic Surgery

## 2014-03-13 ENCOUNTER — Encounter (HOSPITAL_COMMUNITY)
Admission: RE | Disposition: A | Discharge status: Home Health | Payer: Self-pay | Source: Ambulatory Visit | Attending: Orthopedic Surgery

## 2014-03-13 ENCOUNTER — Ambulatory Visit (HOSPITAL_COMMUNITY): Payer: BC Managed Care – PPO | Admitting: Anesthesiology

## 2014-03-13 ENCOUNTER — Inpatient Hospital Stay (HOSPITAL_COMMUNITY): Payer: BC Managed Care – PPO

## 2014-03-13 ENCOUNTER — Encounter (HOSPITAL_COMMUNITY): Payer: Self-pay | Admitting: *Deleted

## 2014-03-13 DIAGNOSIS — G4700 Insomnia, unspecified: Secondary | ICD-10-CM | POA: Diagnosis present

## 2014-03-13 DIAGNOSIS — Z7982 Long term (current) use of aspirin: Secondary | ICD-10-CM

## 2014-03-13 DIAGNOSIS — Z79899 Other long term (current) drug therapy: Secondary | ICD-10-CM

## 2014-03-13 DIAGNOSIS — M1710 Unilateral primary osteoarthritis, unspecified knee: Principal | ICD-10-CM | POA: Diagnosis present

## 2014-03-13 DIAGNOSIS — M179 Osteoarthritis of knee, unspecified: Secondary | ICD-10-CM | POA: Diagnosis present

## 2014-03-13 DIAGNOSIS — I959 Hypotension, unspecified: Secondary | ICD-10-CM | POA: Diagnosis not present

## 2014-03-13 DIAGNOSIS — H9319 Tinnitus, unspecified ear: Secondary | ICD-10-CM | POA: Diagnosis present

## 2014-03-13 DIAGNOSIS — Z87891 Personal history of nicotine dependence: Secondary | ICD-10-CM

## 2014-03-13 DIAGNOSIS — H9190 Unspecified hearing loss, unspecified ear: Secondary | ICD-10-CM | POA: Diagnosis present

## 2014-03-13 DIAGNOSIS — K219 Gastro-esophageal reflux disease without esophagitis: Secondary | ICD-10-CM | POA: Diagnosis present

## 2014-03-13 DIAGNOSIS — I10 Essential (primary) hypertension: Secondary | ICD-10-CM | POA: Diagnosis present

## 2014-03-13 DIAGNOSIS — F411 Generalized anxiety disorder: Secondary | ICD-10-CM | POA: Diagnosis present

## 2014-03-13 DIAGNOSIS — H269 Unspecified cataract: Secondary | ICD-10-CM | POA: Diagnosis present

## 2014-03-13 HISTORY — DX: Adverse effect of unspecified anesthetic, initial encounter: T41.45XA

## 2014-03-13 HISTORY — DX: Other specified postprocedural states: Z98.890

## 2014-03-13 HISTORY — DX: Other complications of anesthesia, initial encounter: T88.59XA

## 2014-03-13 HISTORY — PX: TOTAL KNEE ARTHROPLASTY: SHX125

## 2014-03-13 HISTORY — DX: Nausea with vomiting, unspecified: R11.2

## 2014-03-13 SURGERY — ARTHROPLASTY, KNEE, TOTAL
Anesthesia: General | Laterality: Right

## 2014-03-13 MED ORDER — METHOCARBAMOL 500 MG PO TABS
500.0000 mg | ORAL_TABLET | Freq: Four times a day (QID) | ORAL | Status: DC | PRN
  Administered 2014-03-13 – 2014-03-15 (×2): 500 mg via ORAL
  Filled 2014-03-13 (×2): qty 1

## 2014-03-13 MED ORDER — ONDANSETRON HCL 4 MG/2ML IJ SOLN
4.0000 mg | Freq: Four times a day (QID) | INTRAMUSCULAR | Status: DC | PRN
  Administered 2014-03-13 – 2014-03-14 (×2): 4 mg via INTRAVENOUS
  Filled 2014-03-13 (×2): qty 2

## 2014-03-13 MED ORDER — DEXTROSE 5 % IV SOLN
500.0000 mg | Freq: Four times a day (QID) | INTRAVENOUS | Status: DC | PRN
  Filled 2014-03-13: qty 5

## 2014-03-13 MED ORDER — METHOCARBAMOL 500 MG PO TABS: 500.0000 mg | ORAL_TABLET | Freq: Four times a day (QID) | ORAL | Status: DC

## 2014-03-13 MED ORDER — ACETAMINOPHEN 325 MG PO TABS: 650.0000 mg | ORAL_TABLET | Freq: Four times a day (QID) | ORAL | Status: DC | PRN

## 2014-03-13 MED ORDER — CELECOXIB 200 MG PO CAPS
200.0000 mg | ORAL_CAPSULE | Freq: Two times a day (BID) | ORAL | Status: DC
  Administered 2014-03-13 – 2014-03-17 (×8): 200 mg via ORAL
  Filled 2014-03-13 (×9): qty 1

## 2014-03-13 MED ORDER — ONDANSETRON HCL 4 MG/2ML IJ SOLN
INTRAMUSCULAR | Status: DC | PRN
  Administered 2014-03-13: 4 mg via INTRAVENOUS

## 2014-03-13 MED ORDER — ASPIRIN EC 325 MG PO TBEC
325.0000 mg | DELAYED_RELEASE_TABLET | Freq: Every day | ORAL | Status: DC
  Administered 2014-03-14 – 2014-03-17 (×4): 325 mg via ORAL
  Filled 2014-03-13 (×5): qty 1

## 2014-03-13 MED ORDER — CEFAZOLIN SODIUM-DEXTROSE 2-3 GM-% IV SOLR
2.0000 g | Freq: Four times a day (QID) | INTRAVENOUS | Status: AC
  Administered 2014-03-13 – 2014-03-14 (×2): 2 g via INTRAVENOUS
  Filled 2014-03-13 (×3): qty 50

## 2014-03-13 MED ORDER — BISACODYL 5 MG PO TBEC: 5.0000 mg | DELAYED_RELEASE_TABLET | Freq: Every day | ORAL | Status: DC | PRN

## 2014-03-13 MED ORDER — DIPHENHYDRAMINE HCL 12.5 MG/5ML PO ELIX
12.5000 mg | ORAL_SOLUTION | ORAL | Status: DC | PRN
  Administered 2014-03-15: 25 mg via ORAL
  Filled 2014-03-13: qty 10

## 2014-03-13 MED ORDER — SUFENTANIL CITRATE 50 MCG/ML IV SOLN
INTRAVENOUS | Status: AC
  Filled 2014-03-13: qty 1

## 2014-03-13 MED ORDER — MIDAZOLAM HCL 2 MG/2ML IJ SOLN
INTRAMUSCULAR | Status: AC
  Filled 2014-03-13: qty 2

## 2014-03-13 MED ORDER — SODIUM CHLORIDE 0.9 % IR SOLN
Status: DC | PRN
  Administered 2014-03-13: 1000 mL

## 2014-03-13 MED ORDER — TRAZODONE HCL 100 MG PO TABS
100.0000 mg | ORAL_TABLET | Freq: Every evening | ORAL | Status: DC | PRN
  Filled 2014-03-13: qty 1

## 2014-03-13 MED ORDER — HYDROCODONE-ACETAMINOPHEN 5-325 MG PO TABS
ORAL_TABLET | ORAL | Status: AC
  Administered 2014-03-13: 2 via ORAL
  Filled 2014-03-13: qty 2

## 2014-03-13 MED ORDER — POTASSIUM CHLORIDE IN NACL 20-0.9 MEQ/L-% IV SOLN
INTRAVENOUS | Status: DC
  Administered 2014-03-13: 23:00:00 via INTRAVENOUS
  Filled 2014-03-13 (×3): qty 1000

## 2014-03-13 MED ORDER — DOCUSATE SODIUM 100 MG PO CAPS
100.0000 mg | ORAL_CAPSULE | Freq: Two times a day (BID) | ORAL | Status: DC
  Administered 2014-03-14 – 2014-03-15 (×4): 100 mg via ORAL
  Filled 2014-03-13 (×9): qty 1

## 2014-03-13 MED ORDER — DEXMEDETOMIDINE HCL IN NACL 200 MCG/50ML IV SOLN
INTRAVENOUS | Status: DC | PRN
  Administered 2014-03-13: .7 ug/kg/h via INTRAVENOUS

## 2014-03-13 MED ORDER — ACETAMINOPHEN 650 MG RE SUPP: 650.0000 mg | Freq: Four times a day (QID) | RECTAL | Status: DC | PRN

## 2014-03-13 MED ORDER — BUPIVACAINE LIPOSOME 1.3 % IJ SUSP
20.0000 mL | Freq: Once | INTRAMUSCULAR | Status: AC
  Administered 2014-03-13: 20 mL
  Filled 2014-03-13: qty 20

## 2014-03-13 MED ORDER — LIDOCAINE HCL (CARDIAC) 20 MG/ML IV SOLN
INTRAVENOUS | Status: DC | PRN
  Administered 2014-03-13: 40 mg via INTRAVENOUS

## 2014-03-13 MED ORDER — SUFENTANIL CITRATE 50 MCG/ML IV SOLN
INTRAVENOUS | Status: DC | PRN
  Administered 2014-03-13: 5 ug via INTRAVENOUS
  Administered 2014-03-13: 20 ug via INTRAVENOUS
  Administered 2014-03-13: 5 ug via INTRAVENOUS

## 2014-03-13 MED ORDER — ASPIRIN EC 325 MG PO TBEC: 325.0000 mg | DELAYED_RELEASE_TABLET | Freq: Every day | ORAL | Status: DC

## 2014-03-13 MED ORDER — SODIUM CHLORIDE 0.9 % IJ SOLN
INTRAMUSCULAR | Status: DC | PRN
  Administered 2014-03-13: 40 mL via INTRAVENOUS

## 2014-03-13 MED ORDER — DEXAMETHASONE 6 MG PO TABS
10.0000 mg | ORAL_TABLET | Freq: Three times a day (TID) | ORAL | Status: AC
  Administered 2014-03-14: 10 mg via ORAL
  Filled 2014-03-13 (×3): qty 1

## 2014-03-13 MED ORDER — HYDROMORPHONE HCL PF 1 MG/ML IJ SOLN
0.5000 mg | INTRAMUSCULAR | Status: DC | PRN
  Administered 2014-03-13 – 2014-03-14 (×9): 1 mg via INTRAVENOUS
  Filled 2014-03-13 (×10): qty 1

## 2014-03-13 MED ORDER — LACTATED RINGERS IV SOLN
INTRAVENOUS | Status: DC | PRN
  Administered 2014-03-13 (×2): via INTRAVENOUS

## 2014-03-13 MED ORDER — 0.9 % SODIUM CHLORIDE (POUR BTL) OPTIME
TOPICAL | Status: DC | PRN
  Administered 2014-03-13: 1000 mL

## 2014-03-13 MED ORDER — LACTATED RINGERS IV SOLN: INTRAVENOUS | Status: DC

## 2014-03-13 MED ORDER — OMEPRAZOLE MAGNESIUM 20 MG PO TBEC: 20.0000 mg | DELAYED_RELEASE_TABLET | ORAL | Status: DC

## 2014-03-13 MED ORDER — ONDANSETRON HCL 4 MG PO TABS: 4.0000 mg | ORAL_TABLET | Freq: Four times a day (QID) | ORAL | Status: DC | PRN

## 2014-03-13 MED ORDER — HYDROCHLOROTHIAZIDE 25 MG PO TABS
25.0000 mg | ORAL_TABLET | Freq: Every day | ORAL | Status: DC
  Filled 2014-03-13 (×5): qty 1

## 2014-03-13 MED ORDER — ZOLPIDEM TARTRATE 5 MG PO TABS
5.0000 mg | ORAL_TABLET | Freq: Every day | ORAL | Status: DC
  Administered 2014-03-13 – 2014-03-16 (×4): 5 mg via ORAL
  Filled 2014-03-13 (×5): qty 1

## 2014-03-13 MED ORDER — PHENOL 1.4 % MT LIQD: 1.0000 | OROMUCOSAL | Status: DC | PRN

## 2014-03-13 MED ORDER — HYDROMORPHONE HCL 4 MG PO TABS: 4.0000 mg | ORAL_TABLET | ORAL | Status: DC | PRN

## 2014-03-13 MED ORDER — MIDAZOLAM HCL 5 MG/5ML IJ SOLN
INTRAMUSCULAR | Status: DC | PRN
  Administered 2014-03-13: 2 mg via INTRAVENOUS

## 2014-03-13 MED ORDER — ALUM HYDROXIDE-MAG TRISILICATE 80-20 MG PO CHEW
1.0000 | CHEWABLE_TABLET | ORAL | Status: DC
  Filled 2014-03-13 (×2): qty 1

## 2014-03-13 MED ORDER — ONDANSETRON HCL 4 MG/2ML IJ SOLN: 4.0000 mg | Freq: Once | INTRAMUSCULAR | Status: DC | PRN

## 2014-03-13 MED ORDER — METOCLOPRAMIDE HCL 10 MG PO TABS: 5.0000 mg | ORAL_TABLET | Freq: Three times a day (TID) | ORAL | Status: DC | PRN

## 2014-03-13 MED ORDER — DEXAMETHASONE SODIUM PHOSPHATE 10 MG/ML IJ SOLN
10.0000 mg | Freq: Three times a day (TID) | INTRAMUSCULAR | Status: AC
  Administered 2014-03-13 – 2014-03-14 (×2): 10 mg via INTRAVENOUS
  Filled 2014-03-13 (×3): qty 1

## 2014-03-13 MED ORDER — DEXMEDETOMIDINE HCL IN NACL 200 MCG/50ML IV SOLN
INTRAVENOUS | Status: AC
  Filled 2014-03-13: qty 50

## 2014-03-13 MED ORDER — METOCLOPRAMIDE HCL 5 MG/ML IJ SOLN
5.0000 mg | Freq: Three times a day (TID) | INTRAMUSCULAR | Status: DC | PRN
  Administered 2014-03-13: 5 mg via INTRAVENOUS
  Filled 2014-03-13: qty 2

## 2014-03-13 MED ORDER — LIDOCAINE HCL (CARDIAC) 20 MG/ML IV SOLN
INTRAVENOUS | Status: AC
  Filled 2014-03-13: qty 5

## 2014-03-13 MED ORDER — MENTHOL 3 MG MT LOZG: 1.0000 | LOZENGE | OROMUCOSAL | Status: DC | PRN

## 2014-03-13 MED ORDER — SODIUM CHLORIDE 0.9 % IJ SOLN
INTRAMUSCULAR | Status: AC
  Filled 2014-03-13: qty 10

## 2014-03-13 MED ORDER — FENTANYL CITRATE 0.05 MG/ML IJ SOLN
INTRAMUSCULAR | Status: AC
  Administered 2014-03-13: 50 ug via INTRAVENOUS
  Filled 2014-03-13: qty 2

## 2014-03-13 MED ORDER — ONDANSETRON HCL 4 MG PO TABS: 4.0000 mg | ORAL_TABLET | Freq: Three times a day (TID) | ORAL | Status: DC | PRN

## 2014-03-13 MED ORDER — PROPOFOL 10 MG/ML IV BOLUS
INTRAVENOUS | Status: DC | PRN
  Administered 2014-03-13: 180 mg via INTRAVENOUS

## 2014-03-13 MED ORDER — ALUM HYDROXIDE-MAG CARBONATE 160-105 MG PO CHEW: 1.0000 | CHEWABLE_TABLET | ORAL | Status: DC

## 2014-03-13 MED ORDER — HYDROCODONE-ACETAMINOPHEN 5-325 MG PO TABS
1.0000 | ORAL_TABLET | ORAL | Status: DC | PRN
  Administered 2014-03-13: 2 via ORAL
  Filled 2014-03-13 (×3): qty 2

## 2014-03-13 MED ORDER — PROPOFOL 10 MG/ML IV BOLUS
INTRAVENOUS | Status: AC
  Filled 2014-03-13: qty 20

## 2014-03-13 MED ORDER — ALPRAZOLAM 0.5 MG PO TABS
1.0000 mg | ORAL_TABLET | Freq: Three times a day (TID) | ORAL | Status: DC | PRN
  Administered 2014-03-13 – 2014-03-15 (×3): 1 mg via ORAL
  Filled 2014-03-13 (×3): qty 2

## 2014-03-13 MED ORDER — METHOCARBAMOL 500 MG PO TABS
ORAL_TABLET | ORAL | Status: AC
  Administered 2014-03-13: 500 mg via ORAL
  Filled 2014-03-13: qty 1

## 2014-03-13 MED ORDER — FENTANYL CITRATE 0.05 MG/ML IJ SOLN
25.0000 ug | INTRAMUSCULAR | Status: DC | PRN
  Administered 2014-03-13: 50 ug via INTRAVENOUS

## 2014-03-13 SURGICAL SUPPLY — 63 items
APL SKNCLS STERI-STRIP NONHPOA (GAUZE/BANDAGES/DRESSINGS) ×1
BANDAGE ELASTIC 6 VELCRO ST LF (GAUZE/BANDAGES/DRESSINGS) ×2 IMPLANT
BANDAGE ESMARK 6X9 LF (GAUZE/BANDAGES/DRESSINGS) ×1 IMPLANT
BENZOIN TINCTURE PRP APPL 2/3 (GAUZE/BANDAGES/DRESSINGS) ×2 IMPLANT
BLADE SAG 18X100X1.27 (BLADE) ×4 IMPLANT
BNDG CMPR 9X6 STRL LF SNTH (GAUZE/BANDAGES/DRESSINGS) ×1
BNDG ESMARK 6X9 LF (GAUZE/BANDAGES/DRESSINGS) ×2
BOWL SMART MIX CTS (DISPOSABLE) ×2 IMPLANT
CEMENT BONE SIMPLEX SPEEDSET (Cement) ×4 IMPLANT
COVER SURGICAL LIGHT HANDLE (MISCELLANEOUS) ×2 IMPLANT
CUFF TOURNIQUET SINGLE 34IN LL (TOURNIQUET CUFF) ×2 IMPLANT
DRAPE EXTREMITY T 121X128X90 (DRAPE) ×2 IMPLANT
DRAPE PROXIMA HALF (DRAPES) ×2 IMPLANT
DRAPE U-SHAPE 47X51 STRL (DRAPES) ×2 IMPLANT
DRSG PAD ABDOMINAL 8X10 ST (GAUZE/BANDAGES/DRESSINGS) ×2 IMPLANT
DURAPREP 26ML APPLICATOR (WOUND CARE) ×4 IMPLANT
ELECT CAUTERY BLADE 6.4 (BLADE) ×3 IMPLANT
ELECT REM PT RETURN 9FT ADLT (ELECTROSURGICAL) ×2
ELECTRODE REM PT RTRN 9FT ADLT (ELECTROSURGICAL) ×1 IMPLANT
EVACUATOR 1/8 PVC DRAIN (DRAIN) ×2 IMPLANT
FACESHIELD LNG OPTICON STERILE (SAFETY) ×4 IMPLANT
GLOVE BIOGEL PI IND STRL 7.0 (GLOVE) ×2 IMPLANT
GLOVE BIOGEL PI INDICATOR 7.0 (GLOVE) ×2
GLOVE ECLIPSE 6.5 STRL STRAW (GLOVE) ×4 IMPLANT
GLOVE ORTHO TXT STRL SZ7.5 (GLOVE) ×2 IMPLANT
GOWN STRL REUS W/ TWL LRG LVL3 (GOWN DISPOSABLE) ×1 IMPLANT
GOWN STRL REUS W/ TWL XL LVL3 (GOWN DISPOSABLE) ×1 IMPLANT
GOWN STRL REUS W/TWL LRG LVL3 (GOWN DISPOSABLE) ×2
GOWN STRL REUS W/TWL XL LVL3 (GOWN DISPOSABLE) ×2
HANDPIECE INTERPULSE COAX TIP (DISPOSABLE) ×2
IMMOBILIZER KNEE 22 UNIV (SOFTGOODS) ×2 IMPLANT
IMMOBILIZER KNEE 24 THIGH 36 (MISCELLANEOUS) IMPLANT
IMMOBILIZER KNEE 24 UNIV (MISCELLANEOUS)
KIT BASIN OR (CUSTOM PROCEDURE TRAY) ×2 IMPLANT
KIT ROOM TURNOVER OR (KITS) ×2 IMPLANT
KNEE/VIT E POLY LINER LEVEL 1B ×1 IMPLANT
MANIFOLD NEPTUNE II (INSTRUMENTS) ×2 IMPLANT
NDL 18GX1X1/2 (RX/OR ONLY) (NEEDLE) ×1 IMPLANT
NDL 25GX 5/8IN NON SAFETY (NEEDLE) ×1 IMPLANT
NEEDLE 18GX1X1/2 (RX/OR ONLY) (NEEDLE) ×2 IMPLANT
NEEDLE 25GX 5/8IN NON SAFETY (NEEDLE) ×2 IMPLANT
NS IRRIG 1000ML POUR BTL (IV SOLUTION) ×2 IMPLANT
PACK TOTAL JOINT (CUSTOM PROCEDURE TRAY) ×2 IMPLANT
PAD ARMBOARD 7.5X6 YLW CONV (MISCELLANEOUS) ×4 IMPLANT
PAD CAST 4YDX4 CTTN HI CHSV (CAST SUPPLIES) ×1 IMPLANT
PADDING CAST COTTON 4X4 STRL (CAST SUPPLIES) ×2
PADDING CAST COTTON 6X4 STRL (CAST SUPPLIES) ×2 IMPLANT
PENCIL BUTTON HOLSTER BLD 10FT (ELECTRODE) ×1 IMPLANT
SET HNDPC FAN SPRY TIP SCT (DISPOSABLE) ×1 IMPLANT
SPONGE GAUZE 4X4 12PLY (GAUZE/BANDAGES/DRESSINGS) ×2 IMPLANT
STRIP CLOSURE SKIN 1/2X4 (GAUZE/BANDAGES/DRESSINGS) ×2 IMPLANT
SUCTION FRAZIER TIP 10 FR DISP (SUCTIONS) ×2 IMPLANT
SUT MNCRL AB 4-0 PS2 18 (SUTURE) ×2 IMPLANT
SUT MON AB 2-0 CT1 36 (SUTURE) ×2 IMPLANT
SUT VIC AB 0 CT1 27 (SUTURE) ×2
SUT VIC AB 0 CT1 27XBRD ANBCTR (SUTURE) ×1 IMPLANT
SUT VIC AB 2-0 CT1 27 (SUTURE) ×2
SUT VIC AB 2-0 CT1 TAPERPNT 27 (SUTURE) ×1 IMPLANT
SYR 50ML LL SCALE MARK (SYRINGE) ×2 IMPLANT
SYR CONTROL 10ML LL (SYRINGE) ×2 IMPLANT
TOWEL OR 17X24 6PK STRL BLUE (TOWEL DISPOSABLE) ×2 IMPLANT
TOWEL OR 17X26 10 PK STRL BLUE (TOWEL DISPOSABLE) ×2 IMPLANT
WATER STERILE IRR 1000ML POUR (IV SOLUTION) ×4 IMPLANT

## 2014-03-13 NOTE — Discharge Instructions (Signed)
Total Knee Replacement °Care After °Refer to this sheet in the next few weeks. These instructions provide you with information on caring for yourself after your procedure. Your caregiver also may give you specific instructions. Your treatment has been planned according to the most current medical practices, but problems sometimes occur. Call your caregiver if you have any problems or questions after your procedure. °HOME CARE INSTRUCTIONS  °Weight bearing as tolerated.  Take Aspirin 1 tab a day for the next 30 days to prevent blood clots.  May change dressing daily starting on Saturday.  May shower on Monday, but do not soak incision.  May apply ice for up to 20 minutes at a time for pain and swelling.  Follow up appointment in our office in two weeks.  ° °· See a physical therapist as directed by your caregiver. °· Take over-the-counter or prescription medicines for pain, discomfort, or fever only as directed by your caregiver. °· Avoid lifting or driving until you are instructed otherwise. °· If you have been sent home with a continuous passive motion machine, use it as directed by your caregiver. °SEEK MEDICAL CARE IF: °· You have difficulty breathing. °· Your wound is red, swollen, or has become increasingly painful. °· You have pus draining from your wound. °· You have a bad smell coming from your wound. °· You have persistent bleeding from your wound. °· Your wound breaks open after sutures (stitches) or staples have been removed. °SEEK IMMEDIATE MEDICAL CARE IF:  °· You have a fever. °· You have a rash. °· You have pain or swelling in your calf or thigh. °· You have shortness of breath or chest pain. °· Your range of motion in your knee is decreasing rather than increasing. °MAKE SURE YOU:  °· Understand these instructions. °· Will watch your condition. °· Will get help right away if you are not doing well or get worse. °Document Released: 07/02/2005 Document Revised: 06/13/2012 Document Reviewed:  02/01/2012 °[employer]® Patient Information ©2014 [employer]. ° °

## 2014-03-13 NOTE — Addendum Note (Signed)
Addendum created 03/13/14 1853 by Izora Gala, CRNA   Modules edited: Anesthesia Medication Administration

## 2014-03-13 NOTE — Transfer of Care (Signed)
Immediate Anesthesia Transfer of Care Note  Patient: Gabrielle [name]  Procedure(s) Performed: Procedure(s): TOTAL KNEE ARTHROPLASTY (Right)  Patient Location: PACU  Anesthesia Type:General  Level of Consciousness: awake, sedated and patient cooperative  Airway & Oxygen Therapy: Patient Spontanous Breathing and Patient connected to nasal cannula oxygen  Post-op Assessment: Report given to PACU RN, Post -op Vital signs reviewed and stable and Patient moving all extremities  Post vital signs: Reviewed and stable  Complications: No apparent anesthesia complications

## 2014-03-13 NOTE — H&P (View-Only) (Signed)
TOTAL KNEE ADMISSION H&P  Patient is being admitted for right total knee arthroplasty.  Subjective:  Chief Complaint:right knee pain.  HPI: Gabrielle [name], 63 y.o. female, has a history of pain and functional disability in the right knee due to arthritis and has failed non-surgical conservative treatments for greater than 12 weeks to includeNSAID's and/or analgesics, corticosteriod injections, viscosupplementation injections and activity modification.  Onset of symptoms was gradual, starting 4 years ago with gradually worsening course since that time. The patient noted no past surgery on the right knee(s).  Patient currently rates pain in the right knee(s) at 2 out of 10 with activity. Patient has worsening of pain with activity and weight bearing, pain that interferes with activities of daily living and joint swelling.  Patient has evidence of periarticular osteophytes and joint space narrowing by imaging studies. There is no active infection.  There are no active problems to display for this patient.  Past Medical History  Diagnosis Date  . Hypertension     Past Surgical History  Procedure Laterality Date  . Abdominal hysterectomy       (Not in a hospital admission) Allergies  Allergen Reactions  . Contrast Media [Iodinated Diagnostic Agents] Hives    History  Substance Use Topics  . Smoking status: Never Smoker   . Smokeless tobacco: Not on file  . Alcohol Use: No    No family history on file.   Review of Systems  Constitutional: Negative.   HENT: Positive for hearing loss and tinnitus. Negative for nosebleeds.   Eyes: Negative.   Respiratory: Negative.   Cardiovascular: Negative.   Gastrointestinal: Negative.   Genitourinary: Negative.   Musculoskeletal: Positive for joint pain.  Skin: Negative.   Neurological: Positive for dizziness. Negative for tingling, tremors and headaches.  Endo/Heme/Allergies: Bruises/bleeds easily.  Psychiatric/Behavioral: Positive for  depression. Negative for suicidal ideas. The patient is nervous/anxious and has insomnia.     Objective:  Physical Exam  Constitutional: She is oriented to person, place, and time. She appears well-developed and well-nourished.  HENT:  Head: Normocephalic and atraumatic.  Eyes: EOM are normal. Pupils are equal, round, and reactive to light.  Neck: Normal range of motion. Neck supple.  Cardiovascular: Normal rate and regular rhythm.  Exam reveals no gallop and no friction rub.   No murmur heard. Respiratory: Effort normal and breath sounds normal. No respiratory distress. She has no wheezes. She has no rales.  GI: Soft. Bowel sounds are normal. She exhibits no distension. There is no tenderness.  Musculoskeletal:  antalgic gait on the right where she has a little bit of varus.  She lacks full extension by 5 degrees, relatively abrupt end point.  Flexion to about 100.  Grade IV crepitus patellofemoral joint, a little bit lesser extent medial compartment.  Stable ligaments.  Some atrophy in the leg, not too extreme.  Neurovascularly intact distally.  Neurological: She is alert and oriented to person, place, and time.  Skin: Skin is warm and dry.  Psychiatric: She has a normal mood and affect. Her behavior is normal. Judgment and thought content normal.    Vital signs in last 24 hours: @VSRANGES @  Labs:   There is no height or weight on file to calculate BMI.   Imaging Review Plain radiographs demonstrate severe degenerative joint disease of the right knee(s). The overall alignment ismild varus. The bone quality appears to be fair for age and reported activity level.  Assessment/Plan:  End stage arthritis, right knee   The patient  history, physical examination, clinical judgment of the provider and imaging studies are consistent with end stage degenerative joint disease of the right knee(s) and total knee arthroplasty is deemed medically necessary. The treatment options including  medical management, injection therapy arthroscopy and arthroplasty were discussed at length. The risks and benefits of total knee arthroplasty were presented and reviewed. The risks due to aseptic loosening, infection, stiffness, patella tracking problems, thromboembolic complications and other imponderables were discussed. The patient acknowledged the explanation, agreed to proceed with the plan and consent was signed. Patient is being admitted for inpatient treatment for surgery, pain control, PT, OT, prophylactic antibiotics, VTE prophylaxis, progressive ambulation and ADL's and discharge planning. The patient is planning to be discharged to skilled nursing facility

## 2014-03-13 NOTE — Anesthesia Postprocedure Evaluation (Signed)
  Anesthesia Post-op Note  Patient: Gabrielle [name]  Procedure(s) Performed: Procedure(s): TOTAL KNEE ARTHROPLASTY (Right)  Patient Location: PACU  Anesthesia Type:General  Level of Consciousness: awake and oriented  Airway and Oxygen Therapy: Patient Spontanous Breathing and Patient connected to nasal cannula oxygen  Post-op Pain: mild  Post-op Assessment: Post-op Vital signs reviewed, Patient's Cardiovascular Status Stable, Respiratory Function Stable, Patent Airway and Pain level controlled  Post-op Vital Signs: stable  Complications: No apparent anesthesia complications

## 2014-03-13 NOTE — Anesthesia Preprocedure Evaluation (Signed)
Anesthesia Evaluation  Patient identified by MRN, date of birth, ID band Patient awake    Reviewed: Allergy & Precautions, H&P , NPO status , Patient's Chart, lab work & pertinent test results  Airway Mallampati: II TM Distance: >3 FB Neck ROM: Full    Dental  (+) Teeth Intact, Dental Advisory Given   Pulmonary former smoker,  breath sounds clear to auscultation        Cardiovascular hypertension, Rhythm:Regular Rate:Normal     Neuro/Psych    GI/Hepatic   Endo/Other    Renal/GU      Musculoskeletal   Abdominal (+) + obese,   Peds  Hematology   Anesthesia Other Findings   Reproductive/Obstetrics                           Anesthesia Physical Anesthesia Plan  ASA: II  Anesthesia Plan: General   Post-op Pain Management:    Induction: Intravenous  Airway Management Planned: LMA  Additional Equipment:   Intra-op Plan:   Post-operative Plan:   Informed Consent: I have reviewed the patients History and Physical, chart, labs and discussed the procedure including the risks, benefits and alternatives for the proposed anesthesia with the patient or authorized representative who has indicated his/her understanding and acceptance.   Dental advisory given  Plan Discussed with: CRNA and Anesthesiologist  Anesthesia Plan Comments: (Htn Anxiety Mild obesity  Plan GA with LMA\  Roberts Gaudy, MD)        Anesthesia Quick Evaluation

## 2014-03-13 NOTE — Progress Notes (Signed)
Orthopedic Tech Progress Note Patient Details:  Gabrielle [name] [birth year] [id number]  CPM Right Knee CPM Right Knee: On Right Knee Flexion (Degrees): 60 Right Knee Extension (Degrees): 0 Additional Comments: Trapeze bar   Cammer, Theodoro Parma 03/13/2014, 2:39 PM

## 2014-03-13 NOTE — Interval H&P Note (Signed)
History and Physical Interval Note:  03/13/2014 8:26 AM  Gabrielle [name]  has presented today for surgery, with the diagnosis of DJD RIGHT KNEE  The various methods of treatment have been discussed with the patient and family. After consideration of risks, benefits and other options for treatment, the patient has consented to  Procedure(s): TOTAL KNEE ARTHROPLASTY (Right) as a surgical intervention .  The patient's history has been reviewed, patient examined, no change in status, stable for surgery.  I have reviewed the patient's chart and labs.  Questions were answered to the patient's satisfaction.     Tiffanee Mcnee F

## 2014-03-13 NOTE — Anesthesia Procedure Notes (Signed)
Procedure Name: LMA Insertion Date/Time: 03/13/2014 11:19 AM Performed by: Izora Gala Pre-anesthesia Checklist: Patient identified, Emergency Drugs available, Suction available and Patient being monitored Patient Re-evaluated:Patient Re-evaluated prior to inductionOxygen Delivery Method: Circle system utilized Preoxygenation: Pre-oxygenation with 100% oxygen Intubation Type: IV induction Ventilation: Mask ventilation without difficulty LMA: LMA inserted LMA Size: 4.0 Number of attempts: 1 Placement Confirmation: positive ETCO2

## 2014-03-13 NOTE — Preoperative (Signed)
Beta Blockers   Reason not to administer Beta Blockers:Not Applicable 

## 2014-03-13 NOTE — Discharge Summary (Addendum)
Patient ID: Gabrielle [name] MRN: [id number] DOB/AGE: 04/02/1951 63 y.o.  Admit date: 03/13/2014 Discharge date: 03/15/2014  Admission Diagnoses:  Active Problems:   DJD (degenerative joint disease) of knee   Discharge Diagnoses:  Same  Past Medical History  Diagnosis Date  . Insomnia     takes Trazodone nightly as needed and Ambien nightly   . Tinnitus     takes HCTZ daily to decrease pressure in ears  . Hearing loss     but doesn't have hearing aids  . Hypertension     hasn't been on meds for the past 38yrs   . History of bronchitis     many many yrs ago  . Dizziness     rarely  . Arthritis   . Joint pain   . Joint swelling   . GERD (gastroesophageal reflux disease)     takes Omeprazole every other day  . Constipation     will occasionally take Milk of Mag  . History of colon polyps   . History of colitis   . Diverticulosis   . Anemia     as a child  . Cataracts, bilateral     immature  . Glaucoma     borderline and no drops required  . Anxiety     takes Xanax daily  . Complication of anesthesia   . PONV (postoperative nausea and vomiting)     Surgeries: Procedure(s): TOTAL KNEE ARTHROPLASTY on 03/13/2014   Consultants:    Discharged Condition: Improved  Hospital Course: Gabrielle [name] is an 63 y.o. female who was admitted 03/13/2014 for operative treatment of<principal problem not specified>. Patient has severe unremitting pain that affects sleep, daily activities, and work/hobbies. After pre-op clearance the patient was taken to the operating room on 03/13/2014 and underwent  Procedure(s): TOTAL KNEE ARTHROPLASTY.    Patient was given perioperative antibiotics:     Anti-infectives   Start     Dose/Rate Route Frequency Ordered Stop   03/13/14 1930  ceFAZolin (ANCEF) IVPB 2 g/50 mL premix     2 g 100 mL/hr over 30 Minutes Intravenous Every 6 hours 03/13/14 1621 03/14/14 0147   03/13/14 0600  ceFAZolin (ANCEF) IVPB 2 g/50 mL premix     2 g 100 mL/hr over  30 Minutes Intravenous On call to O.R. 03/12/14 1349 03/13/14 1146       Patient was given sequential compression devices, early ambulation, and chemoprophylaxis to prevent DVT.  Patient benefited maximally from hospital stay and there were no complications.    Recent vital signs:  Patient Vitals for the past 24 hrs:  BP Temp Temp src Pulse Resp SpO2  03/15/14 0538 96/49 mmHg 98.3 F (36.8 C) Oral 73 16 99 %  03/15/14 0000 - - - - 18 96 %  03/14/14 2148 140/59 mmHg 99.5 F (37.5 C) Oral 91 18 96 %  03/14/14 2000 - - - - 18 98 %  03/14/14 1345 137/59 mmHg 98.7 F (37.1 C) Oral 85 18 100 %  03/14/14 0931 120/51 mmHg 98 F (36.7 C) Oral 70 18 100 %     Recent laboratory studies:   Recent Labs  03/14/14 0635 03/15/14 0449  WBC 8.3 15.5*  HGB 11.2* 9.4*  HCT 33.5* 27.2*  PLT 106* 116*  NA 141 142  K 4.2 4.5  CL 104 104  CO2 26 26  BUN 17 18  CREATININE 0.84 0.97  GLUCOSE 149* 129*  CALCIUM 8.6 8.5     Discharge  Medications:     Medication List         ALPRAZolam 1 MG tablet  Commonly known as:  XANAX  Take 1 mg by mouth 3 (three) times daily as needed (tinnitus).     aspirin EC 325 MG tablet  Take 1 tablet (325 mg total) by mouth daily.     b complex vitamins tablet  Take 1 tablet by mouth daily.     bisacodyl 5 MG EC tablet  Commonly known as:  DULCOLAX  Take 1 tablet (5 mg total) by mouth daily as needed for moderate constipation.     GAVISCON EXTRA STRENGTH 160-105 MG Chew  Generic drug:  Alum Hydroxide-Mag Carbonate  Chew 1 tablet by mouth 2 (two) times a week.     hydrochlorothiazide 25 MG tablet  Commonly known as:  HYDRODIURIL  Take 25 mg by mouth daily.     HYDROmorphone 4 MG tablet  Commonly known as:  DILAUDID  Take 1 tablet (4 mg total) by mouth every 4 (four) hours as needed for severe pain.     ICY HOT EX  Apply 1 application topically daily as needed (knee pain).     methocarbamol 500 MG tablet  Commonly known as:  ROBAXIN  Take  1 tablet (500 mg total) by mouth 4 (four) times daily.     naproxen sodium 220 MG tablet  Commonly known as:  ANAPROX  Take 440 mg by mouth daily as needed (knee pain).     omeprazole 20 MG tablet  Commonly known as:  PRILOSEC OTC  Take 20 mg by mouth every other day.     ondansetron 4 MG tablet  Commonly known as:  ZOFRAN  Take 1 tablet (4 mg total) by mouth every 8 (eight) hours as needed for nausea or vomiting.     promethazine 25 MG tablet  Commonly known as:  PHENERGAN  Take 12.5 mg by mouth every morning.     traZODone 50 MG tablet  Commonly known as:  DESYREL  Take 100 mg by mouth at bedtime as needed for sleep.     Vitamin D3 2000 UNITS Tabs  Take 2,000 Units by mouth daily.     zolpidem 10 MG tablet  Commonly known as:  AMBIEN  Take 20 mg by mouth at bedtime.        Diagnostic Studies: Dg Chest 2 View  03/05/2014   CLINICAL DATA:  Preoperative examination, history of hypertension, former smoker  EXAM: CHEST  2 VIEW  COMPARISON:  DG CHEST 2 VIEW dated 06/13/2012; DG CHEST 1V PORT dated 07/29/2009  FINDINGS: Grossly unchanged cardiac silhouette and mediastinal contours with atherosclerotic plaque within a mildly tortuous thoracic aorta. Evaluation of the retrosternal clear space obscured secondary to overlying soft tissues. The lungs appear mildly hyperexpanded with mild diffuse slightly nodular thickening of the pulmonary interstitium. Grossly unchanged bibasilar opacities, left greater than right, likely atelectasis. There is mild eventration of the right hemidiaphragm. No pleural effusion or pneumothorax. No definite evidence of edema. No acute osseus abnormalities.  IMPRESSION: Mild lung hyperexpansion and bronchitic change without acute cardiopulmonary disease.   Electronically Signed   By: Sandi Mariscal M.D.   On: 03/05/2014 12:32   Dg Knee Right Port  03/13/2014   CLINICAL DATA:  Post total knee arthroplasty  EXAM: PORTABLE RIGHT KNEE - 1-2 VIEW  COMPARISON:  09/30/2013   FINDINGS: Two views of the right knee submitted. There is right knee prosthesis in anatomic alignment. Postsurgical changes with  surgical drain and periarticular soft tissue air.  IMPRESSION: Right knee prosthesis in anatomic alignment.   Electronically Signed   By: Lahoma Crocker M.D.   On: 03/13/2014 15:43    Disposition: 01-Home or Self Care  Discharge Orders   Future Orders Complete By Expires   Call MD / Call 911  As directed    Comments:     If you experience chest pain or shortness of breath, CALL 911 and be transported to the hospital emergency room.  If you develope a fever above 101 F, pus (white drainage) or increased drainage or redness at the wound, or calf pain, call your surgeon's office.   Change dressing  As directed    Comments:     Change dressing on Saturday, then change the dressing daily with sterile 4 x 4 inch gauze dressing and apply TED hose.  You may clean the incision with alcohol prior to redressing.   Constipation Prevention  As directed    Comments:     Drink plenty of fluids.  Prune juice may be helpful.  You may use a stool softener, such as Colace (over the counter) 100 mg twice a day.  Use MiraLax (over the counter) for constipation as needed.   CPM  As directed    Comments:     Continuous passive motion machine (CPM):      Use the CPM from 0- to 60 for 6 hours per day.      You may increase by 10 per day.  You may break it up into 2 or 3 sessions per day.      Use CPM for 2-3 weeks or until you are told to stop.   Diet - low sodium heart healthy  As directed    Discharge instructions  As directed    Comments:     Weight bearing as tolerated.  Take Aspirin 1 tab a day for the next 30 days to prevent blood clots.  May change dressing daily starting on Saturday.  May shower on Monday, but do not soak incision.  May apply ice for up to 20 minutes at a time for pain and swelling.  Follow up appointment in our office in two weeks.   Do not put a pillow under the  knee. Place it under the heel.  As directed    Comments:     Place gray foam under operative heel when in bed or in a chair to work on extension   Increase activity slowly as tolerated  As directed    TED hose  As directed    Comments:     Use stockings (TED hose) for 2 weeks on both leg(s).  You may remove them at night for sleeping.      Follow-up Information   Follow up with [facility] F, MD In 2 weeks.   Specialty:  Orthopedic Surgery   Contact information:   [place] [id number] [phone]       Follow up with [facility]. (Someone will contact you from [facility] with start date and time for physical therapy.)    Contact information:   [address] [place] [address] [phone]        Signed: Larae Grooms 03/15/2014, 8:27 AM

## 2014-03-14 ENCOUNTER — Encounter (HOSPITAL_COMMUNITY): Payer: Self-pay | Admitting: General Practice

## 2014-03-14 LAB — BASIC METABOLIC PANEL
BUN: 17 mg/dL (ref 6–23)
CO2: 26 meq/L (ref 19–32)
Calcium: 8.6 mg/dL (ref 8.4–10.5)
Chloride: 104 mEq/L (ref 96–112)
Creatinine, Ser: 0.84 mg/dL (ref 0.50–1.10)
GFR calc Af Amer: 85 mL/min — ABNORMAL LOW (ref >90)
GFR calc non Af Amer: 73 mL/min — ABNORMAL LOW (ref >90)
GLUCOSE: 149 mg/dL — AB (ref 70–99)
POTASSIUM: 4.2 meq/L (ref 3.7–5.3)
Sodium: 141 mEq/L (ref 137–147)

## 2014-03-14 LAB — CBC
HEMATOCRIT: 33.5 % — AB (ref 36.0–46.0)
HEMOGLOBIN: 11.2 g/dL — AB (ref 12.0–15.0)
MCH: 29.3 pg (ref 26.0–34.0)
MCHC: 33.4 g/dL (ref 30.0–36.0)
MCV: 87.7 fL (ref 78.0–100.0)
Platelets: 106 10*3/uL — ABNORMAL LOW (ref 150–400)
RBC: 3.82 MIL/uL — ABNORMAL LOW (ref 3.87–5.11)
RDW: 13.5 % (ref 11.5–15.5)
WBC: 8.3 10*3/uL (ref 4.0–10.5)

## 2014-03-14 MED ORDER — PANTOPRAZOLE SODIUM 40 MG PO TBEC
40.0000 mg | DELAYED_RELEASE_TABLET | Freq: Every day | ORAL | Status: DC | PRN
  Administered 2014-03-14: 40 mg via ORAL
  Filled 2014-03-14: qty 1

## 2014-03-14 MED ORDER — OXYCODONE-ACETAMINOPHEN 5-325 MG PO TABS
1.0000 | ORAL_TABLET | ORAL | Status: DC | PRN
  Administered 2014-03-14: 2 via ORAL
  Filled 2014-03-14: qty 2

## 2014-03-14 NOTE — Evaluation (Signed)
Physical Therapy Evaluation Patient Details Name: Gabrielle [name] MRN: [id number] DOB: 26-Feb-1951 Today's Date: 03/14/2014 Time: 0940-1009 PT Time Calculation (min): 29 min  PT Assessment / Plan / Recommendation History of Present Illness  Pt s/p R TKA for DJD  Clinical Impression  Pt is s/p TKA resulting in the deficits listed below (see PT Problem List). Pt will benefit from skilled PT to increase their independence and safety with mobility to allow discharge to the venue listed below. Pt will need stair training prior to d/c home as she lives on 2nd floor.  Pt wants to go home and not to rehab.     PT Assessment  Patient needs continued PT services    Follow Up Recommendations  Home health PT    Does the patient have the potential to tolerate intense rehabilitation      Barriers to Discharge Decreased caregiver support;Inaccessible home environment Lives alone on 2nd level    Equipment Recommendations  Rolling walker with 5" wheels;3in1 (PT)    Recommendations for Other Services     Frequency 7X/week    Precautions / Restrictions Precautions Precautions: Knee Required Braces or Orthoses: Knee Immobilizer - Right Knee Immobilizer - Right: On when out of bed or walking Restrictions Weight Bearing Restrictions: Yes RLE Weight Bearing: Weight bearing as tolerated   Pertinent Vitals/Pain 5/10 R knee      Mobility  Bed Mobility Overal bed mobility: Needs Assistance Bed Mobility: Supine to Sit Supine to sit: Min assist Sit to supine: Min assist (for RLE management) General bed mobility comments: cues for use of rail and technique Transfers Overall transfer level: Needs assistance Equipment used: Rolling walker (2 wheeled) Transfers: Sit to/from [facility] Sit to Stand: Min guard Stand pivot transfers: Min guard (bed>BSC) General transfer comment: cues for hand placement Ambulation/Gait Ambulation/Gait assistance: Min guard Ambulation Distance  (Feet): 50 Feet Assistive device: Rolling walker (2 wheeled) Gait Pattern/deviations: Step-to pattern;Decreased step length - left;Antalgic General Gait Details: Cueing for proper gait pattern    Exercises Total Joint Exercises Quad Sets: Strengthening;5 reps;Both Heel Slides: AAROM;Right;5 reps   PT Diagnosis: Difficulty walking  PT Problem List: Decreased strength;Decreased range of motion;Decreased balance;Decreased mobility PT Treatment Interventions: DME instruction;Gait training;Stair training;Functional mobility training;Therapeutic activities;Therapeutic exercise     PT Goals(Current goals can be found in the care plan section) Acute Rehab PT Goals Patient Stated Goal: to go home PT Goal Formulation: With patient Time For Goal Achievement: 03/14/14 Potential to Achieve Goals: Good  Visit Information  Last PT Received On: 03/14/14 Assistance Needed: +1 History of Present Illness: Pt s/p R TKA for DJD       Prior Functioning  Home Living Family/patient expects to be discharged to:: Private residence Living Arrangements: Alone Available Help at Discharge: Family;Available 24 hours/day Type of Home: Apartment Home Access: Stairs to enter [employer] of Steps: flight (~12) Entrance Stairs-Rails: Right Home Layout: One level Home Equipment: Tub bench (Tub bench is her aunt's that she is borrowing) Additional Comments: BSC, RW, CPM have been ordered Prior Function Level of Independence: Independent Comments: occasional use of cane Communication Communication: No difficulties    Cognition  Cognition Arousal/Alertness: Awake/alert Behavior During Therapy: WFL for tasks assessed/performed Overall Cognitive Status: Within Functional Limits for tasks assessed    Extremity/Trunk Assessment Upper Extremity Assessment Upper Extremity Assessment: Overall WFL for tasks assessed Lower Extremity Assessment Lower Extremity Assessment: RLE deficits/detail RLE  Deficits / Details: Limited R knee AROM due to pain and dressing.  Fair(-)  quad set. RLE: Unable to fully assess due to pain Cervical / Trunk Assessment Cervical / Trunk Assessment: Normal   Balance Balance Overall balance assessment: Needs assistance Standing balance-Leahy Scale: Poor  End of Session PT - End of Session Equipment Utilized During Treatment: Gait belt Activity Tolerance: Patient tolerated treatment well Patient left: in chair;with family/visitor present Nurse Communication: Mobility status CPM Right Knee CPM Right Knee: On Right Knee Flexion (Degrees): 45 (pt expressed anxiety and pain at 40 degrees. Slowly increase) Right Knee Extension (Degrees): 0 Additional Comments: trapeze bar  GP     Gabrielle [name] 03/14/2014, 12:38 PM

## 2014-03-14 NOTE — Progress Notes (Signed)
Physical Therapy Treatment Patient Details Name: Gabrielle [name] MRN: [id number] DOB: Sep 11, 1951 Today's Date: 03/14/2014 Time: 1191-4782 PT Time Calculation (min): 28 min  PT Assessment / Plan / Recommendation  History of Present Illness Pt s/p R TKA for DJD   PT Comments   Pt progressing well with gait and mobility.  Follow Up Recommendations  Home health PT     Does the patient have the potential to tolerate intense rehabilitation     Barriers to Discharge        Equipment Recommendations  Rolling walker with 5" wheels;3in1 (PT)    Recommendations for Other Services    Frequency 7X/week   Progress towards PT Goals Progress towards PT goals: Progressing toward goals  Plan Current plan remains appropriate    Precautions / Restrictions Precautions Precautions: Knee Required Braces or Orthoses: Knee Immobilizer - Right Knee Immobilizer - Right: On when out of bed or walking Restrictions RLE Weight Bearing: Weight bearing as tolerated   Pertinent Vitals/Pain Pt reports "not muchpain" after gait    Mobility  Bed Mobility Supine to sit: Min assist Sit to supine: Min assist (for R LE) Transfers Transfers: Sit to/from Stand Sit to Stand: Min guard General transfer comment:  (cues for safety with standing from bed and toilet) Ambulation/Gait Ambulation/Gait assistance: Min guard Ambulation Distance (Feet): 95 Feet Assistive device: Rolling walker (2 wheeled) Gait Pattern/deviations: Decreased step length - right;Decreased step length - left General Gait Details:  (cues for posture and RW placement)    Exercises Total Joint Exercises Ankle Circles/Pumps: AROM;Both;10 reps;Supine Quad Sets: Strengthening;10 reps;Supine Heel Slides: AROM;Right;10 reps;Supine Straight Leg Raises: AAROM;Strengthening;10 reps;Supine   PT Diagnosis:    PT Problem List:   PT Treatment Interventions:     PT Goals (current goals can now be found in the care plan section) Acute Rehab PT  Goals Patient Stated Goal: to go home PT Goal Formulation: With patient Time For Goal Achievement: 03/21/14 Potential to Achieve Goals: Good  Visit Information  Last PT Received On: 03/14/14 Assistance Needed: +1 History of Present Illness: Pt s/p R TKA for DJD    Subjective Data  Subjective: "I don't want to go to rehab. I'll do those stairs." Patient Stated Goal: to go home   Cognition  Cognition Arousal/Alertness: Awake/alert Behavior During Therapy: WFL for tasks assessed/performed Overall Cognitive Status: Within Functional Limits for tasks assessed    Balance     End of Session PT - End of Session Equipment Utilized During Treatment: Gait belt Activity Tolerance: Patient tolerated treatment well Patient left: in bed;with call bell/phone within reach;with nursing/sitter in room Nurse Communication: Mobility status   GP     Kayti Poss LUBECK 03/14/2014, 5:12 PM

## 2014-03-14 NOTE — Progress Notes (Signed)
Subjective: 1 Day Post-Op Procedure(s) (LRB): TOTAL KNEE ARTHROPLASTY (Right) Patient reports pain as 3 on 0-10 scale.  Patient was in a fair amount of pain for the first several hours post-op, but she is now resting comfortably.  Some nausea throughout the night but no vomiting.  Positive flatus, but no bm as of yet.  Some lightheadedness/dizziness, with standing but nothing at rest.  Ready to get up with PT today!  Objective: Vital signs in last 24 hours: Temp:  [97.3 F (36.3 C)-98.2 F (36.8 C)] 98.1 F (36.7 C) (03/19 0500) Pulse Rate:  [56-76] 69 (03/19 0500) Resp:  [12-19] 16 (03/19 0500) BP: (89-168)/(49-74) 118/61 mmHg (03/19 0500) SpO2:  [97 %-100 %] 100 % (03/19 0500)  Intake/Output from previous day: 03/18 0701 - 03/19 0700 In: 1120 [P.O.:120; I.V.:1000] Out: 155 [Drains:155] Intake/Output this shift: Total I/O In: 120 [P.O.:120] Out: 105 [Drains:105]  No results found for this basename: HGB,  in the last 72 hours No results found for this basename: WBC, RBC, HCT, PLT,  in the last 72 hours No results found for this basename: NA, K, CL, CO2, BUN, CREATININE, GLUCOSE, CALCIUM,  in the last 72 hours No results found for this basename: LABPT, INR,  in the last 72 hours  Neurologically intact ABD soft Neurovascular intact Sensation intact distally Intact pulses distally Dorsiflexion/Plantar flexion intact Compartment soft No drainage through ace bandage hemovac drain pulled by me today  Assessment/Plan: 1 Day Post-Op Procedure(s) (LRB): TOTAL KNEE ARTHROPLASTY (Right) Advance diet Up with therapy D/C IV fluids Plan for discharge tomorrow.  Most likely home but possibility of SNF.  Dawes, M. Mendel Ryder 03/14/2014, 6:01 AM

## 2014-03-14 NOTE — Progress Notes (Signed)
Occupational Therapy Evaluation Patient Details Name: Gabrielle [name] MRN: [id number] DOB: 1951-05-14 Today's Date: 03/14/2014 Time: 1110-1208 OT Time Calculation (min): 58 min  OT Assessment / Plan / Recommendation History of present illness Pt s/p R TKA for DJD   Clinical Impression   PTA pt lived alone and was independent in ADLs and mobility (however she used a SPC occasionally to walk). Pt presents with anxiety related to pain and ability to move R knee. Provided pt with encouragement and reinforcement to participate in therapies to improve recovery. Pt has access to tub transfer bench from aunt and education and training was provided with return demonstration of tub transfer using tub transfer bench. Pt with improved confidence for ambulation following practice in the rehab gym and chose to ambulate back to her room. Pt required encouragement and training for use of CPM. Pt would benefit from continued OT to address LB dressing, safety, and ADLs.     OT Assessment  Patient needs continued OT Services    Follow Up Recommendations  Supervision/Assistance - 24 hour       Equipment Recommendations  None recommended by OT       Frequency  Min 2X/week    Precautions / Restrictions Precautions Precautions: Knee Required Braces or Orthoses: Knee Immobilizer - Right Knee Immobilizer - Right: On when out of bed or walking Restrictions Weight Bearing Restrictions: Yes RLE Weight Bearing: Weight bearing as tolerated   Pertinent Vitals/Pain 2/10 at beginning of session, 4.5/10 at end of session; RN notified and administered pain medication.     ADL  Eating/Feeding: Independent Where Assessed - Eating/Feeding: Chair Grooming: Supervision/safety;Set up Where Assessed - Grooming: Supported standing Upper Body Bathing: Supervision/safety;Set up Where Assessed - Upper Body Bathing: Unsupported sitting Lower Body Bathing: Moderate assistance Where Assessed - Lower Body Bathing:  Supported sit to stand Upper Body Dressing: Supervision/safety;Set up Where Assessed - Upper Body Dressing: Unsupported sitting Lower Body Dressing: Maximal assistance Where Assessed - Lower Body Dressing: Supported sit to stand Toilet Transfer: Magazine features editor Method: Sit to Loss adjuster, chartered: Other (comment) (sit<> stand recliner to bed) Tub/Shower Transfer: Min guard Tub/Shower Transfer Method: Therapist, art: IT consultant Used: Gait belt;Rolling walker Transfers/Ambulation Related to ADLs: Pt expresses anxiety but with increased confidence for ambulation following practice with tub transfer in gym. Ambulated back to room with min guard.     OT Diagnosis: Generalized weakness;Acute pain  OT Problem List: Decreased strength;Decreased range of motion;Decreased activity tolerance;Impaired balance (sitting and/or standing);Decreased knowledge of use of DME or AE;Decreased knowledge of precautions;Pain OT Treatment Interventions: Self-care/ADL training;Energy conservation;DME and/or AE instruction;Therapeutic activities;Patient/family education;Balance training   OT Goals(Current goals can be found in the care plan section) Acute Rehab OT Goals Patient Stated Goal: to go home OT Goal Formulation: With patient Time For Goal Achievement: 03/21/14 Potential to Achieve Goals: Good  Visit Information  Last OT Received On: 03/14/14 Assistance Needed: +1 History of Present Illness: Pt s/p R TKA for DJD       Prior Functioning     Home Living Family/patient expects to be discharged to:: Private residence Living Arrangements: Alone Available Help at Discharge: Family;Available 24 hours/day Type of Home: Apartment Home Access: Stairs to enter [employer] of Steps: flight (~12) Entrance Stairs-Rails: Right Home Layout: One level Home Equipment: Tub bench (Tub bench is her aunt's that she is  borrowing) Additional Comments: BSC, RW, CPM have been ordered Prior Function Level of Independence: Independent Comments: occasional  use of cane Communication Communication: No difficulties         Vision/Perception Vision - History Patient Visual Report: No change from baseline   Cognition  Cognition Arousal/Alertness: Awake/alert Behavior During Therapy: WFL for tasks assessed/performed Overall Cognitive Status: Within Functional Limits for tasks assessed    Extremity/Trunk Assessment Upper Extremity Assessment Upper Extremity Assessment: Overall WFL for tasks assessed Lower Extremity Assessment Lower Extremity Assessment: Defer to PT evaluation     Mobility Bed Mobility Overal bed mobility: Needs Assistance Bed Mobility: Sit to Supine Sit to supine: Min assist (for RLE management) Transfers Overall transfer level: Needs assistance Equipment used: Rolling walker (2 wheeled) Transfers: Sit to/from Stand Sit to Stand: Min guard General transfer comment: Pt with increased confidence after practicing a few times.           End of Session OT - End of Session Equipment Utilized During Treatment: Gait belt;Rolling walker Activity Tolerance: Patient tolerated treatment well Patient left: in bed;in CPM;with call bell/phone within reach;with family/visitor present Nurse Communication: Patient requests pain meds CPM Right Knee CPM Right Knee: On Right Knee Flexion (Degrees): 45 (pt expressed anxiety and pain at 40 degrees. Slowly increased to 45 degrees and educated pt on increasing every 15 minutes until reaching 60 as tolerated) Right Knee Extension (Degrees): 0 Additional Comments: trapeze bar       Juluis Rainier [phone] 03/14/2014, 12:11 PM

## 2014-03-14 NOTE — Plan of Care (Signed)
Problem: Consults Goal: Diagnosis- Total Joint Replacement Outcome: Completed/Met Date Met:  03/14/14 Primary Total Knee Right

## 2014-03-14 NOTE — Op Note (Signed)
Gabrielle [name], GERMER NO.:  [id number]  MEDICAL RECORD NO.:  [id number]  LOCATION:  [id number]                        FACILITY:  [facility]  PHYSICIAN:  [name], M.D. DATE OF BIRTH:  [birth year]  DATE OF PROCEDURE:  03/13/2014 DATE OF DISCHARGE:                              OPERATIVE REPORT   PREOPERATIVE DIAGNOSES:  Right knee end-stage degenerative arthritis. Most marked patellofemoral joint.  Loose bodies with flexion contracture.  POSTOPERATIVE DIAGNOSES:  Right knee end-stage degenerative arthritis. Most marked patellofemoral joint.  Loose bodies with flexion contracture.  PROCEDURE:  Right knee modified minimally invasive total knee replacement with Stryker triathlon prosthesis.  Soft tissue balancing. Removal of 2 large and numerous small loose bodies.  Cemented, pegged, posterior stabilized #4 femoral component.  Cemented #5 tibial component.  The 9 mm insert.  Cemented resurfacing medial offset 32-mm patellar component.  SURGEON:  [name], M.D.  ASSISTANT:  Eula Listen, PA, present throughout the entire case, necessary for timely completion of procedure.  ANESTHESIA:  General.  BLOOD LOSS:  Minimal.  SPECIMEN:  None.  CULTURES:  None.  COMPLICATIONS:  None.  DRESSINGS:  Soft compressive, knee immobilizer.  DRAINS:  Hemovac x1.  TOURNIQUET TIME:  50 minutes.  DESCRIPTION OF PROCEDURE:  The patient was brought to the operating room, placed on the operating table in supine position.  After adequate anesthesia had been obtained, tourniquet applied, prepped and draped in usual sterile fashion.  Exsanguinated with elevation of Esmarch. Tourniquet inflated to 350 mmHg.  Straight incision above the patella down to tibial tubercle.  Medial arthrotomy, vastus splitting, preserving quad tendon.  Hemostasis with cautery.  Knee exposed.  Two large loose bodies __________removed.  Numerous smaller ones are removed.  Remnants of menisci,  periarticular spurs, loose bodies removed.  Distal femur exposed.  Flexible intramedullary guide.  An 8-mm resection, 5 degrees of valgus.  Using epicondylar axis, the femur was sized, cut, and fitted for a pegged posterior stabilized #4 component. Proximal tibial resection, extramedullary guide, a 3-degree posterior slope cut.  Sized with #5 component.  Patella exposed, posterior 10 mm removed, drilled, sized, and fitted for a 32-mm component.  Debris cleared throughout the knee in flexion, extension, and in the posterior recess.  Trials put in place.  With the 9 mm insert, I was very pleased with biomechanical __________balance in flexion, extension, patellar tracking.  Tibia was marked for rotation and hand reamed.  All trials removed.  Copious irrigation with a pulse irrigating device.  Cement prepared, placed on all components, firmly seated.  Polyethylene attached to tibia, knee reduced.  Patella __________clamp.  Once cement hardened, the knee was irrigated once again.  Soft tissues injected with Exparel.  Hemovac was placed and brought out through a separate stab wound.  Arthrotomy closed with #1 Vicryl.  Skin and subcutaneous tissue with Vicryl, subcutaneous subcuticular closure.  Margins were injected with Marcaine.  Sterile compressive dressing applied.  Tourniquet deflated and removed.  Knee immobilizer applied.  Anesthesia reversed. Brought to the recovery room.  Tolerated the surgery well.  No complications.     [name], M.D.     DFM/MEDQ  D:  03/13/2014  T:  03/13/2014  Job:  [id number]

## 2014-03-14 NOTE — Care Management Note (Addendum)
CARE MANAGEMENT NOTE 03/14/2014  Patient:  Gabrielle [name], Gabrielle [name]   Account Number:  [id number]  Date Initiated:  03/14/2014  Documentation initiated by:  Ricki Miller  Subjective/Objective Assessment:   63 yr old female s/p right total knee arthroplasty.     Action/Plan:   Case Manager spoke with patient concerning home health and DME needs at discharge.Patient preoperatively setup with [facility], no changes.   Anticipated DC Date:  03/15/2014   Anticipated DC Plan:  [place]  CM consult      [facility] Choice  HOME HEALTH  DURABLE MEDICAL EQUIPMENT   Choice offered to / List presented to:  C-1 Patient   DME arranged  [facility]  3-N-1  CPM      DME agency  [employer]     [place] arranged  HH-2 PT     [place]   Status of service:  Completed, signed off Medicare Important Message given?   (If response is "NO", the following Medicare IM given date fields will be blank) Date Medicare IM given:   Date Additional Medicare IM given:    Discharge Disposition:  [place]

## 2014-03-15 ENCOUNTER — Encounter (HOSPITAL_COMMUNITY): Payer: Self-pay | Admitting: Orthopedic Surgery

## 2014-03-15 LAB — CBC
HCT: 27.2 % — ABNORMAL LOW (ref 36.0–46.0)
HEMOGLOBIN: 9.4 g/dL — AB (ref 12.0–15.0)
MCH: 30.1 pg (ref 26.0–34.0)
MCHC: 34.6 g/dL (ref 30.0–36.0)
MCV: 87.2 fL (ref 78.0–100.0)
Platelets: 116 10*3/uL — ABNORMAL LOW (ref 150–400)
RBC: 3.12 MIL/uL — ABNORMAL LOW (ref 3.87–5.11)
RDW: 13.9 % (ref 11.5–15.5)
WBC: 15.5 10*3/uL — ABNORMAL HIGH (ref 4.0–10.5)

## 2014-03-15 LAB — BASIC METABOLIC PANEL
BUN: 18 mg/dL (ref 6–23)
CO2: 26 mEq/L (ref 19–32)
CREATININE: 0.97 mg/dL (ref 0.50–1.10)
Calcium: 8.5 mg/dL (ref 8.4–10.5)
Chloride: 104 mEq/L (ref 96–112)
GFR calc Af Amer: 71 mL/min — ABNORMAL LOW (ref >90)
GFR, EST NON AFRICAN AMERICAN: 61 mL/min — AB (ref >90)
Glucose, Bld: 129 mg/dL — ABNORMAL HIGH (ref 70–99)
Potassium: 4.5 mEq/L (ref 3.7–5.3)
Sodium: 142 mEq/L (ref 137–147)

## 2014-03-15 MED ORDER — HYDROMORPHONE HCL 2 MG PO TABS
2.0000 mg | ORAL_TABLET | ORAL | Status: DC | PRN
  Administered 2014-03-15 (×3): 4 mg via ORAL
  Administered 2014-03-16 (×2): 2 mg via ORAL
  Administered 2014-03-16: 4 mg via ORAL
  Administered 2014-03-17 (×2): 2 mg via ORAL
  Filled 2014-03-15 (×3): qty 1
  Filled 2014-03-15 (×3): qty 2
  Filled 2014-03-15: qty 1
  Filled 2014-03-15 (×2): qty 2

## 2014-03-15 MED ORDER — ALPRAZOLAM 0.5 MG PO TABS
1.0000 mg | ORAL_TABLET | Freq: Three times a day (TID) | ORAL | Status: DC
  Administered 2014-03-15 – 2014-03-17 (×7): 1 mg via ORAL
  Filled 2014-03-15 (×7): qty 2

## 2014-03-15 MED ORDER — ALPRAZOLAM 0.5 MG PO TABS: 1.0000 mg | ORAL_TABLET | Freq: Two times a day (BID) | ORAL | Status: DC

## 2014-03-15 MED ORDER — SODIUM CHLORIDE 0.9 % IV BOLUS (SEPSIS)
500.0000 mL | Freq: Once | INTRAVENOUS | Status: AC
  Administered 2014-03-15: 500 mL via INTRAVENOUS

## 2014-03-15 NOTE — Progress Notes (Signed)
Brief Nutrition Note   RD drawn to pt for positive MST score   Ht: 5'5" Wt: 211 lb  BMI: 35.3  Diet order: Heart   63 y.o. female, has a history of pain and functional disability in the right knee due to arthritis. Pt had right knee joint replacement surgery on 03/14/14. Pt explained that she has lost about 50 lbs in the last year, but this is due to diet changes. She explained that she uses no salt, and doesn't eat a lot of sugar. She also has a good appetite. Reinforced eating protein at each meal to help heal from her surgery. Pt had no dietary concerns at this time.   Labs and medications reviewed.   No interventions needed at this time.   Avel Peace, [facility] Nutrition Intern    Intern note/chart reviewed. Revisions made.  [place], [place], [place] Pager [phone] After Hours Pager

## 2014-03-15 NOTE — Progress Notes (Signed)
Physical Therapy Treatment Patient Details Name: SHERRISE LIBERTO MRN: [id number] DOB: November 14, 1951 Today's Date: 03/15/2014 Time: 4098-1191 PT Time Calculation (min): 28 min  PT Assessment / Plan / Recommendation  History of Present Illness Pt s/p R TKA for DJD   PT Comments   Pt progressing well with all aspects of mobility.  Follow Up Recommendations  Home health PT     Does the patient have the potential to tolerate intense rehabilitation     Barriers to Discharge        Equipment Recommendations  Rolling walker with 5" wheels;3in1 (PT)    Recommendations for Other Services    Frequency 7X/week   Progress towards PT Goals Progress towards PT goals: Progressing toward goals  Plan Current plan remains appropriate    Precautions / Restrictions Precautions Precautions: Knee Required Braces or Orthoses: Knee Immobilizer - Right Knee Immobilizer - Right: On when out of bed or walking Restrictions Weight Bearing Restrictions: Yes RLE Weight Bearing: Weight bearing as tolerated   Pertinent Vitals/Pain 4/78GNFAO thigh with application of CPM. Nursing notified.    Mobility  Bed Mobility Overal bed mobility: Modified Independent (with HOB elevated) Sit to supine: Min assist (A for R LE) Transfers Overall transfer level: Needs assistance Equipment used: Rolling walker (2 wheeled) Transfers: Sit to/from Stand Sit to Stand: Supervision General transfer comment:  (Cues for safety) Ambulation/Gait Ambulation/Gait assistance: Supervision Ambulation Distance (Feet): 150 Feet Assistive device: Rolling walker (2 wheeled) Gait Pattern/deviations: Decreased step length - right;Step-through pattern;Trunk flexed General Gait Details: Pt with flexed trunk which family reports is longstanding.  Worked on fluency of gait. Stairs: Yes Stairs assistance: Min guard Stair Management: One rail Right;Sideways;Step to pattern Number of Stairs: 6 General stair comments: Practiced on stairs  with 1 rail sideways technique.  Also trained on curb step with RW due to home environment set-up.     Exercises Total Joint Exercises Ankle Circles/Pumps: AROM;Both;10 reps;Supine Quad Sets: Strengthening;10 reps;Supine Short Arc Quad: Strengthening;Right;10 reps;Supine Heel Slides: AROM;Right;10 reps;Supine Straight Leg Raises: AAROM;Strengthening;10 reps;Supine   PT Diagnosis:    PT Problem List:   PT Treatment Interventions:     PT Goals (current goals can now be found in the care plan section) Acute Rehab PT Goals Patient Stated Goal: to go home Potential to Achieve Goals: Good  Visit Information  Last PT Received On: 03/15/14 Assistance Needed: +1 History of Present Illness: Pt s/p R TKA for DJD    Subjective Data  Patient Stated Goal: to go home   Cognition  Cognition Arousal/Alertness: Awake/alert Behavior During Therapy: WFL for tasks assessed/performed Overall Cognitive Status: Within Functional Limits for tasks assessed    Balance  Balance Standing balance-Leahy Scale: Fair  End of Session PT - End of Session Equipment Utilized During Treatment: Gait belt Activity Tolerance: Patient tolerated treatment well Patient left: in bed;in CPM;with call bell/phone within reach (CPM 0-55 degrees) Nurse Communication: Mobility status   GP     Cleaven Demario LUBECK 03/15/2014, 1:46 PM

## 2014-03-15 NOTE — Progress Notes (Signed)
Occupational Therapy Treatment and Discharge Patient Details Name: Gabrielle [name] MRN: [id number] DOB: Aug 12, 1951 Today's Date: 03/15/2014 Time: 5456-2563 OT Time Calculation (min): 15 min  OT Assessment / Plan / Recommendation  History of present illness Pt s/p R TKA for DJD   OT comments  Pt seen today for reinforcement of ADLs to maximize Independence. Pt was overall supervision/setup for transfers and grooming at sink. Education and training provided and reinforced for tub transfer and LB dressing and pt feels that she is prepared enough, with the assistance of her family 24/7. Pt has no further concerns. Acute OT to sign off.   Follow Up Recommendations  Supervision/Assistance - 24 hour       Equipment Recommendations  None recommended by OT          Progress towards OT Goals Progress towards OT goals: Goals met/education completed, patient discharged from [place] All goals met and education completed, patient discharged from OT services    Precautions / Restrictions Precautions Precautions: Knee Required Braces or Orthoses: Knee Immobilizer - Right Knee Immobilizer - Right: On when out of bed or walking Restrictions Weight Bearing Restrictions: Yes RLE Weight Bearing: Weight bearing as tolerated   Pertinent Vitals/Pain No c/o pain.    ADL  Grooming: Supervision/safety;Set up Where Assessed - Grooming: Supported standing Upper Body Bathing: Supervision/safety;Set up Where Assessed - Upper Body Bathing: Supported standing Lower Body Bathing: Minimal assistance Where Assessed - Lower Body Bathing: Supported sit to Lobbyist: Supervision/safety Armed forces technical officer Method: Sit to Loss adjuster, chartered: Raised toilet seat with arms (or 3-in-1 over toilet) Toileting - Clothing Manipulation and Hygiene: Supervision/safety Where Assessed - Toileting Clothing Manipulation and Hygiene: Sit to stand from 3-in-1 or toilet Equipment Used: Gait belt;Rolling  walker      Visit Information  Last OT Received On: 03/15/14 Assistance Needed: +1 History of Present Illness: Pt s/p R TKA for DJD          Cognition  Cognition Arousal/Alertness: Awake/alert Behavior During Therapy: WFL for tasks assessed/performed Overall Cognitive Status: Within Functional Limits for tasks assessed    Mobility  Bed Mobility Overal bed mobility: Modified Independent (with HOB elevated) Transfers Overall transfer level: Needs assistance Equipment used: Rolling walker (2 wheeled) Transfers: Sit to/from Stand Sit to Stand: Supervision General transfer comment: Pt with significantly more confidence today and overall Supervision level          End of Session OT - End of Session Equipment Utilized During Treatment: Gait belt;Rolling walker;Right knee immobilizer Activity Tolerance: Patient tolerated treatment well Patient left: in chair;with call bell/phone within reach;with family/visitor present       Juluis Rainier [phone] 03/15/2014, 12:59 PM

## 2014-03-15 NOTE — Progress Notes (Signed)
Physical Therapy Treatment Patient Details Name: Gabrielle [name] MRN: [id number] DOB: [birth year] Today's Date: 03/15/2014 Time: 3235-5732 PT Time Calculation (min): 31 min  PT Assessment / Plan / Recommendation  History of Present Illness Pt s/p R TKA for DJD   PT Comments   Initiated stair training this treatment.  Pt able to do 4 with MIN/guard.  Con't to recommend home with HHPT.  Follow Up Recommendations  Home health PT     Does the patient have the potential to tolerate intense rehabilitation     Barriers to Discharge        Equipment Recommendations  Rolling walker with 5" wheels;3in1 (PT)    Recommendations for Other Services    Frequency 7X/week   Progress towards PT Goals Progress towards PT goals: Progressing toward goals  Plan Current plan remains appropriate    Precautions / Restrictions Precautions Precautions: Knee Required Braces or Orthoses: Knee Immobilizer - Right Knee Immobilizer - Right: On when out of bed or walking Restrictions Weight Bearing Restrictions: Yes RLE Weight Bearing: Weight bearing as tolerated   Pertinent Vitals/Pain "minimal"    Mobility  Bed Mobility Overal bed mobility: Modified Independent (with HOB elevated) Transfers Overall transfer level: Needs assistance Equipment used: Rolling walker (2 wheeled) Sit to Stand: Supervision Ambulation/Gait Ambulation/Gait assistance: Supervision;Min guard Ambulation Distance (Feet): 150 Feet Assistive device: Rolling walker (2 wheeled) Gait Pattern/deviations: Step-to pattern General Gait Details:  (Cues for posture & increasing step length for smooth gait) Stairs: Yes Stairs assistance: Min guard Stair Management: One rail Right;Step to pattern;Sideways Number of Stairs: 4    Exercises Total Joint Exercises Ankle Circles/Pumps: AROM;Both;10 reps;Supine Quad Sets: Strengthening;10 reps;Supine Short Arc Quad: Strengthening;Right;10 reps;Supine Heel Slides: AROM;Right;10  reps;Supine Straight Leg Raises: AAROM;Strengthening;10 reps;Supine   PT Diagnosis:    PT Problem List:   PT Treatment Interventions:     PT Goals (current goals can now be found in the care plan section) Acute Rehab PT Goals Patient Stated Goal: to go home Potential to Achieve Goals: Good  Visit Information  Last PT Received On: 03/15/14 Assistance Needed: +1 History of Present Illness: Pt s/p R TKA for DJD    Subjective Data  Patient Stated Goal: to go home   Cognition  Cognition Arousal/Alertness: Awake/alert Behavior During Therapy: WFL for tasks assessed/performed Overall Cognitive Status: Within Functional Limits for tasks assessed    Balance  Balance Standing balance-Leahy Scale: Fair  End of Session PT - End of Session Equipment Utilized During Treatment: Gait belt Activity Tolerance: Patient tolerated treatment well Patient left: in chair;with call bell/phone within reach;with family/visitor present Nurse Communication: Mobility status   GP     Michaeljoseph Revolorio LUBECK 03/15/2014, 11:36 AM

## 2014-03-15 NOTE — Progress Notes (Signed)
Subjective: 2 Days Post-Op Procedure(s) (LRB): TOTAL KNEE ARTHROPLASTY (Right) Patient reports pain as 3 on 0-10 scale.  Patient remains a little anxious about ringing and "clicking" in her ears.  This has been an ongoing issue for a while now.  Blood pressure a little low this morning.  Denies any lightheadedness/dizziness.  No nausea/vomiting.  Positive flatus, but no bm as of yet.  Good appetite.  Objective: Vital signs in last 24 hours: Temp:  [98 F (36.7 C)-99.5 F (37.5 C)] 98.3 F (36.8 C) (03/20 0538) Pulse Rate:  [70-91] 73 (03/20 0538) Resp:  [16-18] 16 (03/20 0538) BP: (96-140)/(49-59) 96/49 mmHg (03/20 0538) SpO2:  [96 %-100 %] 99 % (03/20 0538)  Intake/Output from previous day: 03/19 0701 - 03/20 0700 In: 240 [P.O.:240] Out: 350 [Urine:350] Intake/Output this shift:     Recent Labs  03/14/14 0635 03/15/14 0449  HGB 11.2* 9.4*    Recent Labs  03/14/14 0635 03/15/14 0449  WBC 8.3 15.5*  RBC 3.82* 3.12*  HCT 33.5* 27.2*  PLT 106* 116*    Recent Labs  03/14/14 0635 03/15/14 0449  NA 141 142  K 4.2 4.5  CL 104 104  CO2 26 26  BUN 17 18  CREATININE 0.84 0.97  GLUCOSE 149* 129*  CALCIUM 8.6 8.5   No results found for this basename: LABPT, INR,  in the last 72 hours  Neurologically intact ABD soft Neurovascular intact Sensation intact distally Intact pulses distally Dorsiflexion/Plantar flexion intact Compartment soft Scant drainage Dressing changed by me today  Assessment/Plan: 2 Days Post-Op Procedure(s) (LRB): TOTAL KNEE ARTHROPLASTY (Right) Advance diet Up with therapy Changed Xanax to 1mg  TID D/C percocet and resume Dilaudid  IV fluid bolus today Resume HCTZ when appropriate Plan for D/C to home on Sunday with home health PT  [place], M. LINDSEY 03/15/2014, 8:02 AM

## 2014-03-16 DIAGNOSIS — I959 Hypotension, unspecified: Secondary | ICD-10-CM

## 2014-03-16 LAB — BASIC METABOLIC PANEL
BUN: 18 mg/dL (ref 6–23)
CO2: 29 mEq/L (ref 19–32)
CREATININE: 0.88 mg/dL (ref 0.50–1.10)
Calcium: 8.2 mg/dL — ABNORMAL LOW (ref 8.4–10.5)
Chloride: 103 mEq/L (ref 96–112)
GFR, EST AFRICAN AMERICAN: 80 mL/min — AB (ref >90)
GFR, EST NON AFRICAN AMERICAN: 69 mL/min — AB (ref >90)
Glucose, Bld: 95 mg/dL (ref 70–99)
POTASSIUM: 4.1 meq/L (ref 3.7–5.3)
Sodium: 139 mEq/L (ref 137–147)

## 2014-03-16 LAB — CBC
HEMATOCRIT: 25.6 % — AB (ref 36.0–46.0)
Hemoglobin: 8.5 g/dL — ABNORMAL LOW (ref 12.0–15.0)
MCH: 29.3 pg (ref 26.0–34.0)
MCHC: 33.2 g/dL (ref 30.0–36.0)
MCV: 88.3 fL (ref 78.0–100.0)
PLATELETS: 98 10*3/uL — AB (ref 150–400)
RBC: 2.9 MIL/uL — ABNORMAL LOW (ref 3.87–5.11)
RDW: 14.1 % (ref 11.5–15.5)
WBC: 5.4 10*3/uL (ref 4.0–10.5)

## 2014-03-16 MED ORDER — SODIUM CHLORIDE 0.9 % IV BOLUS (SEPSIS)
500.0000 mL | Freq: Once | INTRAVENOUS | Status: AC
  Administered 2014-03-16: 500 mL via INTRAVENOUS

## 2014-03-16 NOTE — Progress Notes (Signed)
Pt BP = 111/46 following 500 cc bolus of NS and Hgb =8.5.  Luna Glasgow, P.A. Notified.  No new orders at this time.

## 2014-03-16 NOTE — Progress Notes (Signed)
On call provider contacted because pt BP= 96/40. Repeat BP = 94/48.  Orders to give 500 cc bolus of NS.  CBC and BMP drawn at 0630.  Patient asymptomatic.  Will continue to monitor patient and VS.

## 2014-03-16 NOTE — Progress Notes (Signed)
Physical Therapy Treatment Patient Details Name: Gabrielle [name] MRN: [id number] DOB: 04/12/1951 Today's Date: 03/16/2014 Time: 1443-1540 PT Time Calculation (min): 25 min  PT Assessment / Plan / Recommendation  History of Present Illness Pt s/p R TKA for DJD   PT Comments   Pt making great progress with mobility. Stair education completed today.  Follow Up Recommendations  Home health PT     Equipment Recommendations  Rolling walker with 5" wheels;3in1 (PT)       Frequency 7X/week   Progress towards PT Goals Progress towards PT goals: Progressing toward goals  Plan Current plan remains appropriate    Precautions / Restrictions Precautions Precautions: Knee Required Braces or Orthoses: Knee Immobilizer - Right Knee Immobilizer - Right: On when out of bed or walking Restrictions RLE Weight Bearing: Weight bearing as tolerated       Mobility  Bed Mobility General bed mobility comments: in recliner before session and to edge of bed for lunch after session. pt to call for nurse tech if needed assist to lie down after eating. Transfers Overall transfer level: Needs assistance Equipment used: Rolling walker (2 wheeled) Transfers: Sit to/from Stand Sit to Stand: Supervision General transfer comment: cues for hand and right leg placement with transfers. Ambulation/Gait Ambulation/Gait assistance: Min guard;Supervision Ambulation Distance (Feet): 500 Feet Assistive device: Rolling walker (2 wheeled) Gait Pattern/deviations: Step-through pattern;Decreased stride length Gait velocity interpretation: at or above normal speed for age/gender General Gait Details: cues for upright posture with gait. Stairs assistance: Min guard Stair Management: One rail Right;Step to pattern;Sideways Number of Stairs: 12 General stair comments: pt able to recall sequence from yesterday's session.     Exercises Total Joint Exercises Ankle Circles/Pumps: AROM;Both;10 reps;Seated Quad Sets:  AROM;Strengthening;Both;10 reps;Seated Heel Slides: AAROM;Strengthening;Right;10 reps;Seated Straight Leg Raises: AAROM;Strengthening;Right;10 reps;Seated Knee Flexion: AROM;Strengthening;Right;10 reps;Seated Goniometric ROM: seated with foot on floor: right knee flexion 60 degrees active, 70 degress AArom     PT Goals (current goals can now be found in the care plan section) Acute Rehab PT Goals Patient Stated Goal: to go home PT Goal Formulation: With patient Time For Goal Achievement: 03/21/14 Potential to Achieve Goals: Good  Visit Information  Last PT Received On: 03/16/14 Assistance Needed: +1 History of Present Illness: Pt s/p R TKA for DJD    Subjective Data  Patient Stated Goal: to go home   Cognition  Cognition Arousal/Alertness: Awake/alert Behavior During Therapy: WFL for tasks assessed/performed Overall Cognitive Status: Within Functional Limits for tasks assessed       End of Session PT - End of Session Equipment Utilized During Treatment: Gait belt Activity Tolerance: Patient tolerated treatment well Patient left: in bed;with call bell/phone within reach;Other (comment) (seated edge of bed eating lunch)   GP     Willow Ora 03/16/2014, 2:07 PM  Willow Ora, PTA Office- [phone]

## 2014-03-16 NOTE — Progress Notes (Signed)
Subjective: 3 Days Post-Op Procedure(s) (LRB): TOTAL KNEE ARTHROPLASTY (Right) Patient reports pain as 3 on 0-10 scale.    Objective: Vital signs in last 24 hours: Temp:  [97.8 F (36.6 C)-98.9 F (37.2 C)] 97.8 F (36.6 C) (03/21 0400) Pulse Rate:  [74-88] 81 (03/21 0744) Resp:  [16-18] 18 (03/21 0744) BP: (94-112)/(40-64) 111/46 mmHg (03/21 0744) SpO2:  [96 %-99 %] 99 % (03/21 0744)  Intake/Output from previous day: 03/20 0701 - 03/21 0700 In: 1200 [P.O.:1200] Out: 2 [Urine:2] Intake/Output this shift:     Recent Labs  03/14/14 0635 03/15/14 0449 03/16/14 0628  HGB 11.2* 9.4* 8.5*    Recent Labs  03/15/14 0449 03/16/14 0628  WBC 15.5* 5.4  RBC 3.12* 2.90*  HCT 27.2* 25.6*  PLT 116* 98*    Recent Labs  03/15/14 0449 03/16/14 0628  NA 142 139  K 4.5 4.1  CL 104 103  CO2 26 29  BUN 18 18  CREATININE 0.97 0.88  GLUCOSE 129* 95  CALCIUM 8.5 8.2*   No results found for this basename: LABPT, INR,  in the last 72 hours  Neurologically intact ABD soft Neurovascular intact Sensation intact distally Intact pulses distally Dorsiflexion/Plantar flexion intact Incision: scant drainage Difficulty with hypotension   Will follow  Given fluid bolus today    Pressure improved Assessment/Plan: 3 Days Post-Op Procedure(s) (LRB): TOTAL KNEE ARTHROPLASTY (Right) Advance diet Up with therapy Plan for discharge tomorrow  Linda Hedges 03/16/2014, 9:53 AM

## 2014-03-16 NOTE — Progress Notes (Signed)
Clinical Education officer, museum (CSW) received referral for SNF placement. Per RN case manager's note patient is going home with home health services. Please reconsult if future social work needs arise. CSW signing off.   Blima Rich, [place] Weekend CSW [phone]

## 2014-03-16 NOTE — Progress Notes (Signed)
Physical Therapy Treatment Patient Details Name: Gabrielle [name] MRN: [id number] DOB: 06/05/1951 Today's Date: 03/16/2014 Time: 0160-1093 PT Time Calculation (min): 25 min  PT Assessment / Plan / Recommendation  History of Present Illness Pt s/p R TKA for DJD   PT Comments   Pt making steady progress. Exercises only this pm session due to pt has been up to bathroom multiple times and just to back to bed.   Follow Up Recommendations  Home health PT     Equipment Recommendations  Rolling walker with 5" wheels;3in1 (PT)       Frequency 7X/week   Progress towards PT Goals Progress towards PT goals: Progressing toward goals  Plan Current plan remains appropriate    Precautions / Restrictions Precautions Precautions: Knee Required Braces or Orthoses: Knee Immobilizer - Right Knee Immobilizer - Right: On when out of bed or walking Restrictions RLE Weight Bearing: Weight bearing as tolerated       Exercises Total Joint Exercises Ankle Circles/Pumps: AROM;Both;10 reps;Supine Quad Sets: AROM;Strengthening;Both;10 reps;Supine Short Arc Quad: AAROM;Strengthening;Right;10 reps;Supine Heel Slides: AAROM;Strengthening;Right;10 reps;Supine Hip ABduction/ADduction: AROM;Strengthening;Right;10 reps;Supine Straight Leg Raises: AAROM;Strengthening;Right;10 reps;Supine Knee Flexion: AROM;Strengthening;Right;10 reps;Seated Goniometric ROM: seated with foot on floor: right knee flexion 60 degrees active, 70 degress AArom     PT Goals (current goals can now be found in the care plan section) Acute Rehab PT Goals Patient Stated Goal: to go home PT Goal Formulation: With patient Time For Goal Achievement: 03/21/14 Potential to Achieve Goals: Good  Visit Information  Last PT Received On: 03/16/14 Assistance Needed: +1 History of Present Illness: Pt s/p R TKA for DJD    Subjective Data  Patient Stated Goal: to go home   Cognition  Cognition Arousal/Alertness: Awake/alert Behavior  During Therapy: WFL for tasks assessed/performed Overall Cognitive Status: Within Functional Limits for tasks assessed    End of Session PT - End of Session Equipment Utilized During Treatment: Gait belt Activity Tolerance: Patient tolerated treatment well Patient left: in bed;in CPM;with call bell/phone within reach;with family/visitor present Nurse Communication: Mobility status;Patient requests pain meds CPM Right Knee CPM Right Knee: On Right Knee Flexion (Degrees): 65 Right Knee Extension (Degrees): 0   GP     Willow Ora 03/16/2014, 3:34 PM  Willow Ora, PTA Office- [phone]

## 2014-03-17 NOTE — Progress Notes (Signed)
Discharge instructions and prescriptions given and explained to patient. Patient denies questions or concerns at this time. IV removed. Vital signs stable. Patient discharged via wheelchair with personal belongings, prescriptions, and discharge packet.

## 2014-03-17 NOTE — Discharge Summary (Signed)
Patient ID: Gabrielle [name] MRN: [id number] DOB/AGE: 63/08/1951 63 y.o.  Admit date: 03/13/2014 Discharge date: 03/17/2014  Admission Diagnoses:  Active Problems:   DJD (degenerative joint disease) of knee   Hypotension, unspecified   Discharge Diagnoses:  Same  Past Medical History  Diagnosis Date  . Insomnia     takes Trazodone nightly as needed and Ambien nightly   . Tinnitus     takes HCTZ daily to decrease pressure in ears  . Hearing loss     but doesn't have hearing aids  . Hypertension     hasn't been on meds for the past 87yrs   . History of bronchitis     many many yrs ago  . Dizziness     rarely  . Arthritis   . Joint pain   . Joint swelling   . GERD (gastroesophageal reflux disease)     takes Omeprazole every other day  . Constipation     will occasionally take Milk of Mag  . History of colon polyps   . History of colitis   . Diverticulosis   . Anemia     as a child  . Cataracts, bilateral     immature  . Glaucoma     borderline and no drops required  . Anxiety     takes Xanax daily  . Complication of anesthesia   . PONV (postoperative nausea and vomiting)     Surgeries: Procedure(s): TOTAL KNEE ARTHROPLASTY on 03/13/2014   Consultants:    Discharged Condition: Improved  Hospital Course: Gabrielle [name] is an 63 y.o. female who was admitted 03/13/2014 for operative treatment of<principal problem not specified>. Patient has severe unremitting pain that affects sleep, daily activities, and work/hobbies. After pre-op clearance the patient was taken to the operating room on 03/13/2014 and underwent  Procedure(s): TOTAL KNEE ARTHROPLASTY.    Patient was given perioperative antibiotics: Anti-infectives   Start     Dose/Rate Route Frequency Ordered Stop   03/13/14 1930  ceFAZolin (ANCEF) IVPB 2 g/50 mL premix     2 g 100 mL/hr over 30 Minutes Intravenous Every 6 hours 03/13/14 1621 03/14/14 0147   03/13/14 0600  ceFAZolin (ANCEF) IVPB 2 g/50 mL premix      2 g 100 mL/hr over 30 Minutes Intravenous On call to O.R. 03/12/14 1349 03/13/14 1146       Patient was given sequential compression devices, early ambulation, and chemoprophylaxis to prevent DVT.  Post op day one and day 2 this patient had difficulty with hypotension.  She received fluid boluses of 500cc of NS each day.  Post op day 3 this patient hypotension has resolved.  She has ambulated well and ascended and descended stairs without difficulty.  Patient benefited maximally from hospital stay and there were no other complications.    Recent vital signs: Patient Vitals for the past 24 hrs:  BP Temp Temp src Pulse Resp SpO2  03/17/14 0539 124/50 mmHg 98.1 F (36.7 C) Oral 77 18 100 %  03/16/14 2034 115/49 mmHg 98.5 F (36.9 C) Oral 88 - 100 %  03/16/14 1715 116/47 mmHg 98.3 F (36.8 C) Oral 99 16 100 %     Recent laboratory studies:  Recent Labs  03/15/14 0449 03/16/14 0628  WBC 15.5* 5.4  HGB 9.4* 8.5*  HCT 27.2* 25.6*  PLT 116* 98*  NA 142 139  K 4.5 4.1  CL 104 103  CO2 26 29  BUN 18 18  CREATININE 0.97 0.88  GLUCOSE 129* 95  CALCIUM 8.5 8.2*     Discharge Medications:     Medication List         ALPRAZolam 1 MG tablet  Commonly known as:  XANAX  Take 1 mg by mouth 3 (three) times daily as needed (tinnitus).     aspirin EC 325 MG tablet  Take 1 tablet (325 mg total) by mouth daily.     b complex vitamins tablet  Take 1 tablet by mouth daily.     bisacodyl 5 MG EC tablet  Commonly known as:  DULCOLAX  Take 1 tablet (5 mg total) by mouth daily as needed for moderate constipation.     GAVISCON EXTRA STRENGTH 160-105 MG Chew  Generic drug:  Alum Hydroxide-Mag Carbonate  Chew 1 tablet by mouth 2 (two) times a week.     hydrochlorothiazide 25 MG tablet  Commonly known as:  HYDRODIURIL  Take 25 mg by mouth daily.     HYDROmorphone 4 MG tablet  Commonly known as:  DILAUDID  Take 1 tablet (4 mg total) by mouth every 4 (four) hours as needed for  severe pain.     ICY HOT EX  Apply 1 application topically daily as needed (knee pain).     methocarbamol 500 MG tablet  Commonly known as:  ROBAXIN  Take 1 tablet (500 mg total) by mouth 4 (four) times daily.     naproxen sodium 220 MG tablet  Commonly known as:  ANAPROX  Take 440 mg by mouth daily as needed (knee pain).     omeprazole 20 MG tablet  Commonly known as:  PRILOSEC OTC  Take 20 mg by mouth every other day.     ondansetron 4 MG tablet  Commonly known as:  ZOFRAN  Take 1 tablet (4 mg total) by mouth every 8 (eight) hours as needed for nausea or vomiting.     promethazine 25 MG tablet  Commonly known as:  PHENERGAN  Take 12.5 mg by mouth every morning.     traZODone 50 MG tablet  Commonly known as:  DESYREL  Take 100 mg by mouth at bedtime as needed for sleep.     Vitamin D3 2000 UNITS Tabs  Take 2,000 Units by mouth daily.     zolpidem 10 MG tablet  Commonly known as:  AMBIEN  Take 20 mg by mouth at bedtime.        Diagnostic Studies: Dg Chest 2 View  03/05/2014   CLINICAL DATA:  Preoperative examination, history of hypertension, former smoker  EXAM: CHEST  2 VIEW  COMPARISON:  DG CHEST 2 VIEW dated 06/13/2012; DG CHEST 1V PORT dated 07/29/2009  FINDINGS: Grossly unchanged cardiac silhouette and mediastinal contours with atherosclerotic plaque within a mildly tortuous thoracic aorta. Evaluation of the retrosternal clear space obscured secondary to overlying soft tissues. The lungs appear mildly hyperexpanded with mild diffuse slightly nodular thickening of the pulmonary interstitium. Grossly unchanged bibasilar opacities, left greater than right, likely atelectasis. There is mild eventration of the right hemidiaphragm. No pleural effusion or pneumothorax. No definite evidence of edema. No acute osseus abnormalities.  IMPRESSION: Mild lung hyperexpansion and bronchitic change without acute cardiopulmonary disease.   Electronically Signed   By: Sandi Mariscal M.D.   On:  03/05/2014 12:32   Dg Knee Right Port  03/13/2014   CLINICAL DATA:  Post total knee arthroplasty  EXAM: PORTABLE RIGHT KNEE - 1-2 VIEW  COMPARISON:  09/30/2013  FINDINGS: Two views of the right knee  submitted. There is right knee prosthesis in anatomic alignment. Postsurgical changes with surgical drain and periarticular soft tissue air.  IMPRESSION: Right knee prosthesis in anatomic alignment.   Electronically Signed   By: Natasha Mead M.D.   On: 03/13/2014 15:43    Disposition: 01-Home or Self Care      Discharge Orders   Future Orders Complete By Expires   Call MD / Call 911  As directed    Comments:     If you experience chest pain or shortness of breath, CALL 911 and be transported to the hospital emergency room.  If you develope a fever above 101 F, pus (white drainage) or increased drainage or redness at the wound, or calf pain, call your surgeon's office.   Call MD / Call 911  As directed    Comments:     If you experience chest pain or shortness of breath, CALL 911 and be transported to the hospital emergency room.  If you develope a fever above 101 F, pus (white drainage) or increased drainage or redness at the wound, or calf pain, call your surgeon's office.   Change dressing  As directed    Comments:     Change dressing on Saturday, then change the dressing daily with sterile 4 x 4 inch gauze dressing and apply TED hose.  You may clean the incision with alcohol prior to redressing.   Change dressing  As directed    Comments:     Change the dressing daily with sterile 4 x 4 inch gauze dressing and apply TED hose.  You may clean the incision with alcohol prior to redressing.   Constipation Prevention  As directed    Comments:     Drink plenty of fluids.  Prune juice may be helpful.  You may use a stool softener, such as Colace (over the counter) 100 mg twice a day.  Use MiraLax (over the counter) for constipation as needed.   Constipation Prevention  As directed    Comments:      Drink plenty of fluids.  Prune juice may be helpful.  You may use a stool softener, such as Colace (over the counter) 100 mg twice a day.  Use MiraLax (over the counter) for constipation as needed.   CPM  As directed    Comments:     Continuous passive motion machine (CPM):      Use the CPM from 0- to 60 for 6 hours per day.      You may increase by 10 per day.  You may break it up into 2 or 3 sessions per day.      Use CPM for 2-3 weeks or until you are told to stop.   CPM  As directed    Comments:     Continuous passive motion machine (CPM):      Use the CPM from 0 to 90 for 6 hours per day.       You may break it up into 2 or 3 sessions per day.      Use CPM for 2 weeks or until you are told to stop.   Diet - low sodium heart healthy  As directed    Diet - low sodium heart healthy  As directed    Discharge instructions  As directed    Comments:     Weight bearing as tolerated.  Take Aspirin 1 tab a day for the next 30 days to prevent blood clots.  May change  dressing daily starting on Saturday.  May shower on Monday, but do not soak incision.  May apply ice for up to 20 minutes at a time for pain and swelling.  Follow up appointment in our office in two weeks.   Discharge instructions  As directed    Comments:     Total Knee Replacement Care After Refer to this sheet in the next few weeks. These discharge instructions provide you with general information on caring for yourself after you leave the hospital. Your caregiver may also give you specific instructions. Your treatment has been planned according to the most current medical practices available, but unavoidable complications sometimes occur. If you have any problems or questions after discharge, please call your caregiver. Regaining a near full range of motion of your knee within the first 3 to 6 weeks after surgery is critical. [place] may resume a normal diet and activities as directed.  Perform exercises as  directed.  Place gray foam block, curve side up under heel at all times except when in CPM or when walking.  DO NOT modify, tear, cut, or change in any way the gray foam block. You will receive physical therapy daily  Take showers instead of baths until informed otherwise.  You may shower on Sunday.  Please wash whole leg including wound with soap and water  Change bandages (dressings)daily It is OK to take over-the-counter tylenol in addition to the oxycodone for pain, discomfort, or fever. Oxycodone is VERY constipating.  Please take stool softener twice a day and laxatives daily until bowels are regular Eat a well-balanced diet.  Avoid lifting or driving until you are instructed otherwise.  Make an appointment to see your caregiver for stitches (suture) or staple removal as directed.  If you have been sent home with a continuous passive motion machine (CPM machine), 0-90 degrees 6 hrs a day   2 hrs a shift SEEK MEDICAL CARE IF: You have swelling of your calf or leg.  You develop shortness of breath or chest pain.  You have redness, swelling, or increasing pain in the wound.  There is pus or any unusual drainage coming from the surgical site.  You notice a bad smell coming from the surgical site or dressing.  The surgical site breaks open after sutures or staples have been removed.  There is persistent bleeding from the suture or staple line.  You are getting worse or are not improving.  You have any other questions or concerns.  SEEK IMMEDIATE MEDICAL CARE IF:  You have a fever.  You develop a rash.  You have difficulty breathing.  You develop any reaction or side effects to medicines given.  Your knee motion is decreasing rather than improving.  MAKE SURE YOU:  Understand these instructions.  Will watch your condition.  Will get help right away if you are not doing well or get worse.   Do not put a pillow under the knee. Place it under the heel.  As directed    Comments:      Place gray foam under operative heel when in bed or in a chair to work on extension   Do not put a pillow under the knee. Place it under the heel.  As directed    Comments:     Place gray foam block, curve side up under heel at all times except when in CPM or when walking.  DO NOT modify, tear, cut, or change in any way the  gray foam block.   Increase activity slowly as tolerated  As directed    Increase activity slowly as tolerated  As directed    TED hose  As directed    Comments:     Use stockings (TED hose) for 2 weeks on both leg(s).  You may remove them at night for sleeping.   TED hose  As directed    Comments:     Use stockings (TED hose) for 2 weeks on both leg(s).  You may remove them at night for sleeping.      Follow-up Information   Follow up with [facility], MD In 2 weeks.   Specialty:  Orthopedic Surgery   Contact information:   [place] [address] [phone]       Follow up with [facility]. (Someone will contact you from [facility] with start date and time for physical therapy.)    Contact information:   [address] [place] [address] [phone]       Follow up with [name], MD On 03/26/2014. (appt time 2pm)    Specialty:  Orthopedic Surgery   Contact information:   [place] [address] [phone]        Signed: Linda Hedges 03/17/2014, 10:35 AM

## 2014-03-17 NOTE — Progress Notes (Signed)
Physical Therapy Treatment Patient Details Name: Gabrielle [name] MRN: [id number] DOB: 1951/11/29 Today's Date: 03/17/2014 Time: 4166-0630 PT Time Calculation (min): 20 min  PT Assessment / Plan / Recommendation  History of Present Illness Pt s/p R TKA for DJD   PT Comments   Overall moving quite well; Able to return demo safe technique for stairs; OK for dc home from PT standpoint  Follow Up Recommendations  Home health PT     Does the patient have the potential to tolerate intense rehabilitation     Barriers to Discharge        Equipment Recommendations  Rolling walker with 5" wheels;3in1 (PT)    Recommendations for Other Services    Frequency 7X/week   Progress towards PT Goals Progress towards PT goals: Progressing toward goals  Plan Current plan remains appropriate    Precautions / Restrictions Precautions Precautions: Knee Required Braces or Orthoses: Knee Immobilizer - Right Knee Immobilizer - Right: On when out of bed or walking Restrictions RLE Weight Bearing: Weight bearing as tolerated   Pertinent Vitals/Pain 6/10 pain with knee flexion patient repositioned for comfort and Optimal knee extension     Mobility  Bed Mobility Overal bed mobility: Modified Independent Transfers Overall transfer level: Needs assistance Equipment used: Rolling walker (2 wheeled) Transfers: Sit to/from Stand Sit to Stand: Supervision General transfer comment: cues for hand and right leg placement with transfers. Ambulation/Gait Ambulation/Gait assistance: Supervision Ambulation Distance (Feet): 500 Feet Assistive device: Rolling walker (2 wheeled) Gait Pattern/deviations: Step-through pattern General Gait Details: cues for upright posture with gait. Stairs: Yes Stairs assistance: Min guard Stair Management: One rail Right;Step to pattern;Sideways Number of Stairs: 12 General stair comments: pt able to recall sequence from yesterday's session. Managed steps well     Exercises Total Joint Exercises Heel Slides: AAROM;Strengthening;Right;10 reps;Supine Straight Leg Raises: AROM;Right;10 reps;Strengthening Goniometric ROM: performing hell slide in bed 55deg; limited by pain; extension: near zero   PT Diagnosis:    PT Problem List:   PT Treatment Interventions:     PT Goals (current goals can now be found in the care plan section) Acute Rehab PT Goals Patient Stated Goal: to go home PT Goal Formulation: With patient Time For Goal Achievement: 03/21/14 Potential to Achieve Goals: Good  Visit Information  Last PT Received On: 03/17/14 Assistance Needed: +1 History of Present Illness: Pt s/p R TKA for DJD    Subjective Data  Subjective: Hopoing to go home today Patient Stated Goal: to go home   Cognition  Cognition Arousal/Alertness: Awake/alert Behavior During Therapy: WFL for tasks assessed/performed Overall Cognitive Status: Within Functional Limits for tasks assessed    Balance     End of Session PT - End of Session Activity Tolerance: Patient tolerated treatment well Patient left: Other (comment) (walking back to room with rehab tech, Larene Beach) Nurse Communication: Mobility status   GP     Roney Marion [facility] 03/17/2014, 11:39 AM Roney Marion, [place] Pager [phone] Office [phone]

## 2014-03-30 ENCOUNTER — Emergency Department (HOSPITAL_COMMUNITY)
Admission: EM | Admit: 2014-03-30 | Discharge: 2014-03-30 | Disposition: A | Discharge status: Home / Self Care | Payer: BC Managed Care – PPO | Attending: Emergency Medicine | Admitting: Emergency Medicine

## 2014-03-30 ENCOUNTER — Encounter (HOSPITAL_COMMUNITY): Payer: Self-pay | Admitting: Emergency Medicine

## 2014-03-30 ENCOUNTER — Emergency Department (HOSPITAL_COMMUNITY): Payer: BC Managed Care – PPO

## 2014-03-30 DIAGNOSIS — Z87891 Personal history of nicotine dependence: Secondary | ICD-10-CM | POA: Insufficient documentation

## 2014-03-30 DIAGNOSIS — Z860100 Personal history of colon polyps, unspecified: Secondary | ICD-10-CM | POA: Insufficient documentation

## 2014-03-30 DIAGNOSIS — H9319 Tinnitus, unspecified ear: Secondary | ICD-10-CM | POA: Insufficient documentation

## 2014-03-30 DIAGNOSIS — Z79899 Other long term (current) drug therapy: Secondary | ICD-10-CM | POA: Insufficient documentation

## 2014-03-30 DIAGNOSIS — Z8709 Personal history of other diseases of the respiratory system: Secondary | ICD-10-CM | POA: Insufficient documentation

## 2014-03-30 DIAGNOSIS — Z862 Personal history of diseases of the blood and blood-forming organs and certain disorders involving the immune mechanism: Secondary | ICD-10-CM | POA: Insufficient documentation

## 2014-03-30 DIAGNOSIS — G4700 Insomnia, unspecified: Secondary | ICD-10-CM | POA: Insufficient documentation

## 2014-03-30 DIAGNOSIS — I10 Essential (primary) hypertension: Secondary | ICD-10-CM | POA: Insufficient documentation

## 2014-03-30 DIAGNOSIS — F411 Generalized anxiety disorder: Secondary | ICD-10-CM | POA: Insufficient documentation

## 2014-03-30 DIAGNOSIS — Z7982 Long term (current) use of aspirin: Secondary | ICD-10-CM | POA: Insufficient documentation

## 2014-03-30 DIAGNOSIS — Z9889 Other specified postprocedural states: Secondary | ICD-10-CM | POA: Insufficient documentation

## 2014-03-30 DIAGNOSIS — R109 Unspecified abdominal pain: Secondary | ICD-10-CM

## 2014-03-30 DIAGNOSIS — M129 Arthropathy, unspecified: Secondary | ICD-10-CM | POA: Insufficient documentation

## 2014-03-30 DIAGNOSIS — K5900 Constipation, unspecified: Secondary | ICD-10-CM | POA: Insufficient documentation

## 2014-03-30 DIAGNOSIS — K219 Gastro-esophageal reflux disease without esophagitis: Secondary | ICD-10-CM | POA: Insufficient documentation

## 2014-03-30 LAB — CBC WITH DIFFERENTIAL/PLATELET
BASOS ABS: 0 10*3/uL (ref 0.0–0.1)
BASOS PCT: 1 % (ref 0–1)
Eosinophils Absolute: 0 10*3/uL (ref 0.0–0.7)
Eosinophils Relative: 0 % (ref 0–5)
HCT: 31.7 % — ABNORMAL LOW (ref 36.0–46.0)
Hemoglobin: 10.4 g/dL — ABNORMAL LOW (ref 12.0–15.0)
Lymphocytes Relative: 37 % (ref 12–46)
Lymphs Abs: 2.2 10*3/uL (ref 0.7–4.0)
MCH: 28.3 pg (ref 26.0–34.0)
MCHC: 32.8 g/dL (ref 30.0–36.0)
MCV: 86.4 fL (ref 78.0–100.0)
Monocytes Absolute: 0.3 10*3/uL (ref 0.1–1.0)
Monocytes Relative: 5 % (ref 3–12)
NEUTROS ABS: 3.5 10*3/uL (ref 1.7–7.7)
NEUTROS PCT: 57 % (ref 43–77)
PLATELETS: 146 10*3/uL — AB (ref 150–400)
RBC: 3.67 MIL/uL — ABNORMAL LOW (ref 3.87–5.11)
RDW: 14.6 % (ref 11.5–15.5)
WBC: 6 10*3/uL (ref 4.0–10.5)

## 2014-03-30 LAB — URINALYSIS, ROUTINE W REFLEX MICROSCOPIC
BILIRUBIN URINE: NEGATIVE
Glucose, UA: NEGATIVE mg/dL
Hgb urine dipstick: NEGATIVE
Ketones, ur: 40 mg/dL — AB
Nitrite: NEGATIVE
Protein, ur: NEGATIVE mg/dL
SPECIFIC GRAVITY, URINE: 1.009 (ref 1.005–1.030)
UROBILINOGEN UA: 0.2 mg/dL (ref 0.0–1.0)
pH: 8 (ref 5.0–8.0)

## 2014-03-30 LAB — COMPREHENSIVE METABOLIC PANEL
ALT: 12 U/L (ref 0–35)
AST: 21 U/L (ref 0–37)
Albumin: 3.2 g/dL — ABNORMAL LOW (ref 3.5–5.2)
Alkaline Phosphatase: 110 U/L (ref 39–117)
BUN: 5 mg/dL — AB (ref 6–23)
CO2: 22 mEq/L (ref 19–32)
Calcium: 8.8 mg/dL (ref 8.4–10.5)
Chloride: 104 mEq/L (ref 96–112)
Creatinine, Ser: 0.75 mg/dL (ref 0.50–1.10)
GFR calc Af Amer: >90 mL/min (ref >90)
GFR calc non Af Amer: 89 mL/min — ABNORMAL LOW (ref >90)
Glucose, Bld: 87 mg/dL (ref 70–99)
Potassium: 3.6 mEq/L — ABNORMAL LOW (ref 3.7–5.3)
SODIUM: 141 meq/L (ref 137–147)
Total Bilirubin: 0.7 mg/dL (ref 0.3–1.2)
Total Protein: 6.9 g/dL (ref 6.0–8.3)

## 2014-03-30 LAB — URINE MICROSCOPIC-ADD ON

## 2014-03-30 MED ORDER — DICYCLOMINE HCL 10 MG PO CAPS
20.0000 mg | ORAL_CAPSULE | Freq: Once | ORAL | Status: AC
  Administered 2014-03-30: 20 mg via ORAL
  Filled 2014-03-30: qty 2

## 2014-03-30 MED ORDER — SODIUM CHLORIDE 0.9 % IV BOLUS (SEPSIS): 1000.0000 mL | Freq: Once | INTRAVENOUS | Status: DC

## 2014-03-30 MED ORDER — GI COCKTAIL ~~LOC~~
30.0000 mL | Freq: Once | ORAL | Status: AC
  Administered 2014-03-30: 30 mL via ORAL
  Filled 2014-03-30: qty 30

## 2014-03-30 MED ORDER — ONDANSETRON HCL 4 MG/2ML IJ SOLN
4.0000 mg | Freq: Once | INTRAMUSCULAR | Status: AC
  Administered 2014-03-30: 4 mg via INTRAVENOUS
  Filled 2014-03-30: qty 2

## 2014-03-30 MED ORDER — DEXTROSE-NACL 5-0.45 % IV SOLN
INTRAVENOUS | Status: DC
  Administered 2014-03-30: 13:00:00 via INTRAVENOUS

## 2014-03-30 MED ORDER — DICYCLOMINE HCL 20 MG PO TABS: 20.0000 mg | ORAL_TABLET | Freq: Two times a day (BID) | ORAL | Status: DC

## 2014-03-30 NOTE — ED Provider Notes (Signed)
CSN: [id number]     Arrival date & time 03/30/14  1200 History   First MD Initiated Contact with Patient 03/30/14 1241     Chief Complaint  Patient presents with  . Abdominal Pain     (Consider location/radiation/quality/duration/timing/severity/associated sxs/prior Treatment) Patient is a 63 y.o. female presenting with abdominal pain.  Abdominal Pain  Pt with recent knee replacement states she got constipated a few days ago due to pain medications. She took some colace and and milk-of-magnesia and has been passing stool for the last several days. She woke up this morning with some cramping diffuse abdominal pain, nausea, dry heaves, and poor PO intake. She feels weak and dehydrated. No bloody or melanic stool.  Past Medical History  Diagnosis Date  . Insomnia     takes Trazodone nightly as needed and Ambien nightly   . Tinnitus     takes HCTZ daily to decrease pressure in ears  . Hearing loss     but doesn't have hearing aids  . Hypertension     hasn't been on meds for the past 53yrs   . History of bronchitis     many many yrs ago  . Dizziness     rarely  . Arthritis   . Joint pain   . Joint swelling   . GERD (gastroesophageal reflux disease)     takes Omeprazole every other day  . Constipation     will occasionally take Milk of Mag  . History of colon polyps   . History of colitis   . Diverticulosis   . Anemia     as a child  . Cataracts, bilateral     immature  . Glaucoma     borderline and no drops required  . Anxiety     takes Xanax daily  . Complication of anesthesia   . PONV (postoperative nausea and vomiting)    Past Surgical History  Procedure Laterality Date  . Abdominal hysterectomy    . Tonsillectomy    . Appendectomy    . Tubal ligation    . Colonoscopy    . Total knee arthroplasty Right 03/13/2014    DR MURPHY  . Total knee arthroplasty Right 03/13/2014    Procedure: TOTAL KNEE ARTHROPLASTY;  Surgeon: [name], MD;  Location: [place];   Service: Orthopedics;  Laterality: Right;   No family history on file. History  Substance Use Topics  . Smoking status: Former Research scientist (life sciences)  . Smokeless tobacco: Never Used     Comment: quit smoking 19yrs ago  . Alcohol Use: No   OB History   Grav Para Term Preterm Abortions TAB SAB Ect Mult Living                 Review of Systems  Gastrointestinal: Positive for abdominal pain.   All other systems reviewed and are negative except as noted in HPI.     Allergies  Contrast media and Hydrocodone  Home Medications   Current Outpatient Rx  Name  Route  Sig  Dispense  Refill  . ALPRAZolam (XANAX) 1 MG tablet   Oral   Take 5 mg by mouth 3 (three) times daily as needed for anxiety or sleep (tinnitus).          . Alum Hydroxide-Mag Carbonate (GAVISCON EXTRA STRENGTH) 160-105 MG CHEW   Oral   Chew 1 tablet by mouth 2 (two) times a week.         Marland Kitchen aspirin EC 325 MG tablet  Oral   Take 1 tablet (325 mg total) by mouth daily.   30 tablet   0   . b complex vitamins tablet   Oral   Take 1 tablet by mouth daily.         . bisacodyl (DULCOLAX) 5 MG EC tablet   Oral   Take 1 tablet (5 mg total) by mouth daily as needed for moderate constipation.   30 tablet   0   . Cholecalciferol (VITAMIN D3) 2000 UNITS TABS   Oral   Take 2,000 Units by mouth daily.         . hydrochlorothiazide (HYDRODIURIL) 25 MG tablet   Oral   Take 25 mg by mouth daily.         Marland Kitchen HYDROmorphone (DILAUDID) 4 MG tablet   Oral   Take 1 tablet (4 mg total) by mouth every 4 (four) hours as needed for severe pain.   60 tablet   0   . Menthol, Topical Analgesic, (ICY HOT EX)   Apply externally   Apply 1 application topically daily as needed (knee pain).         . methocarbamol (ROBAXIN) 500 MG tablet   Oral   Take 1 tablet (500 mg total) by mouth 4 (four) times daily.   90 tablet   0   . naproxen sodium (ANAPROX) 220 MG tablet   Oral   Take 440 mg by mouth daily as needed (knee  pain).         Marland Kitchen omeprazole (PRILOSEC OTC) 20 MG tablet   Oral   Take 20 mg by mouth every other day.         . ondansetron (ZOFRAN) 4 MG tablet   Oral   Take 1 tablet (4 mg total) by mouth every 8 (eight) hours as needed for nausea or vomiting.   40 tablet   0   . promethazine (PHENERGAN) 25 MG tablet   Oral   Take 12.5 mg by mouth every morning.         . traZODone (DESYREL) 50 MG tablet   Oral   Take 100 mg by mouth at bedtime as needed for sleep.          BP 124/68  Pulse 74  Temp(Src) 98.3 F (36.8 C) (Oral)  Resp 18  SpO2 100% Physical Exam  Nursing note and vitals reviewed. Constitutional: She is oriented to person, place, and time. She appears well-developed and well-nourished.  HENT:  Head: Normocephalic and atraumatic.  Eyes: EOM are normal. Pupils are equal, round, and reactive to light.  Neck: Normal range of motion. Neck supple.  Cardiovascular: Normal rate, normal heart sounds and intact distal pulses.   Pulmonary/Chest: Effort normal and breath sounds normal. She has no wheezes. She has no rales.  Abdominal: Bowel sounds are normal. She exhibits no distension. There is no tenderness. There is no rebound and no guarding.  Musculoskeletal: She exhibits no edema and no tenderness.  Healing scar R knee, no signs of infection, decreased ROM due to recent surgery  Neurological: She is alert and oriented to person, place, and time. She has normal strength. No cranial nerve deficit or sensory deficit.  Skin: Skin is warm and dry. No rash noted.  Psychiatric: She has a normal mood and affect.    ED Course  Procedures (including critical care time) Labs Review Labs Reviewed  CBC WITH DIFFERENTIAL - Abnormal; Notable for the following:    RBC 3.67 (*)  Hemoglobin 10.4 (*)    HCT 31.7 (*)    Platelets 146 (*)    All other components within normal limits  URINALYSIS, ROUTINE W REFLEX MICROSCOPIC - Abnormal; Notable for the following:    Ketones, ur  40 (*)    Leukocytes, UA TRACE (*)    All other components within normal limits  COMPREHENSIVE METABOLIC PANEL - Abnormal; Notable for the following:    Potassium 3.6 (*)    BUN 5 (*)    Albumin 3.2 (*)    GFR calc non Af Amer 89 (*)    All other components within normal limits  URINE MICROSCOPIC-ADD ON - Abnormal; Notable for the following:    Squamous Epithelial / LPF FEW (*)    All other components within normal limits   Imaging Review Dg Abd Acute W/chest  03/30/2014   CLINICAL DATA:  Constipation  or small bowel obstruction.  EXAM: ACUTE ABDOMEN SERIES (ABDOMEN 2 VIEW & CHEST 1 VIEW)  COMPARISON:  03/05/2014  FINDINGS: Cardiomediastinal silhouette is stable. No acute infiltrate or pleural effusion. No pulmonary edema. There is nonspecific nonobstructive bowel gas pattern. No free abdominal air. No significant colonic stool.  IMPRESSION: Negative abdominal radiographs.  No acute cardiopulmonary disease.   Electronically Signed   By: Lahoma Crocker M.D.   On: 03/30/2014 14:26     EKG Interpretation None      MDM   Final diagnoses:  Abdominal cramping  Constipation    Note patient states saline exacerbates her tinnitus and requests D5 1/2NS.   4:36 PM Labs and imaging reviewed and unremarkable. Pt's abdomen remains benign, but complaining of cramping pain. Given bentyl and GI cocktail with some improvement and wants to go home. She was advised to avoid narcotic pain medications if possible. Follow up with Ortho as planned, and return to the ER for any other concerns.   Charles B. Karle Starch, MD 03/30/14 [phone]

## 2014-03-30 NOTE — ED Notes (Signed)
Pt reports when belching abd pain decreases.

## 2014-03-30 NOTE — Discharge Instructions (Signed)
Abdominal Pain, Women °Abdominal (stomach, pelvic, or belly) pain can be caused by many things. It is important to tell your doctor: °· The location of the pain. °· Does it come and go or is it present all the time? °· Are there things that start the pain (eating certain foods, exercise)? °· Are there other symptoms associated with the pain (fever, nausea, vomiting, diarrhea)? °All of this is helpful to know when trying to find the cause of the pain. °CAUSES  °· Stomach: virus or bacteria infection, or ulcer. °· Intestine: appendicitis (inflamed appendix), regional ileitis (Crohn's disease), ulcerative colitis (inflamed colon), irritable bowel syndrome, diverticulitis (inflamed diverticulum of the colon), or cancer of the stomach or intestine. °· Gallbladder disease or stones in the gallbladder. °· Kidney disease, kidney stones, or infection. °· Pancreas infection or cancer. °· Fibromyalgia (pain disorder). °· Diseases of the female organs: °· Uterus: fibroid (non-cancerous) tumors or infection. °· Fallopian tubes: infection or tubal pregnancy. °· Ovary: cysts or tumors. °· Pelvic adhesions (scar tissue). °· Endometriosis (uterus lining tissue growing in the pelvis and on the pelvic organs). °· Pelvic congestion syndrome (female organs filling up with blood just before the menstrual period). °· Pain with the menstrual period. °· Pain with ovulation (producing an egg). °· Pain with an IUD (intrauterine device, birth control) in the uterus. °· Cancer of the female organs. °· Functional pain (pain not caused by a disease, may improve without treatment). °· Psychological pain. °· Depression. °DIAGNOSIS  °Your doctor will decide the seriousness of your pain by doing an examination. °· Blood tests. °· X-rays. °· Ultrasound. °· CT scan (computed tomography, special type of X-ray). °· MRI (magnetic resonance imaging). °· Cultures, for infection. °· Barium enema (dye inserted in the large intestine, to better view it with  X-rays). °· Colonoscopy (looking in intestine with a lighted tube). °· Laparoscopy (minor surgery, looking in abdomen with a lighted tube). °· Major abdominal exploratory surgery (looking in abdomen with a large incision). °TREATMENT  °The treatment will depend on the cause of the pain.  °· Many cases can be observed and treated at home. °· Over-the-counter medicines recommended by your caregiver. °· Prescription medicine. °· Antibiotics, for infection. °· Birth control pills, for painful periods or for ovulation pain. °· Hormone treatment, for endometriosis. °· Nerve blocking injections. °· Physical therapy. °· Antidepressants. °· Counseling with a psychologist or psychiatrist. °· Minor or major surgery. °HOME CARE INSTRUCTIONS  °· Do not take laxatives, unless directed by your caregiver. °· Take over-the-counter pain medicine only if ordered by your caregiver. Do not take aspirin because it can cause an upset stomach or bleeding. °· Try a clear liquid diet (broth or water) as ordered by your caregiver. Slowly move to a bland diet, as tolerated, if the pain is related to the stomach or intestine. °· Have a thermometer and take your temperature several times a day, and record it. °· Bed rest and sleep, if it helps the pain. °· Avoid sexual intercourse, if it causes pain. °· Avoid stressful situations. °· Keep your follow-up appointments and tests, as your caregiver orders. °· If the pain does not go away with medicine or surgery, you may try: °· Acupuncture. °· Relaxation exercises (yoga, meditation). °· Group therapy. °· Counseling. °SEEK MEDICAL CARE IF:  °· You notice certain foods cause stomach pain. °· Your home care treatment is not helping your pain. °· You need stronger pain medicine. °· You want your IUD removed. °· You feel faint or   lightheaded.  You develop nausea and vomiting.  You develop a rash.  You are having side effects or an allergy to your medicine. SEEK IMMEDIATE MEDICAL CARE IF:   Your  pain does not go away or gets worse.  You have a fever.  Your pain is felt only in portions of the abdomen. The right side could possibly be appendicitis. The left lower portion of the abdomen could be colitis or diverticulitis.  You are passing blood in your stools (bright red or black tarry stools, with or without vomiting).  You have blood in your urine.  You develop chills, with or without a fever.  You pass out. MAKE SURE YOU:   Understand these instructions.  Will watch your condition.  Will get help right away if you are not doing well or get worse. Document Released: 10/10/2007 Document Revised: 03/06/2012 Document Reviewed: 10/30/2009 [facility] Patient Information 2014 [place], [place].  Constipation, Adult Constipation is when a person has fewer than 3 bowel movements a week; has difficulty having a bowel movement; or has stools that are dry, hard, or larger than normal. As people grow older, constipation is more common. If you try to fix constipation with medicines that make you have a bowel movement (laxatives), the problem may get worse. Long-term laxative use may cause the muscles of the colon to become weak. A low-fiber diet, not taking in enough fluids, and taking certain medicines may make constipation worse. CAUSES   Certain medicines, such as antidepressants, pain medicine, iron supplements, antacids, and water pills.   Certain diseases, such as diabetes, irritable bowel syndrome (IBS), thyroid disease, or depression.   Not drinking enough water.   Not eating enough fiber-rich foods.   Stress or travel.  Lack of physical activity or exercise.  Not going to the restroom when there is the urge to have a bowel movement.  Ignoring the urge to have a bowel movement.  Using laxatives too much. SYMPTOMS   Having fewer than 3 bowel movements a week.   Straining to have a bowel movement.   Having hard, dry, or larger than normal stools.   Feeling  full or bloated.   Pain in the lower abdomen.  Not feeling relief after having a bowel movement. DIAGNOSIS  Your caregiver will take a medical history and perform a physical exam. Further testing may be done for severe constipation. Some tests may include:   A barium enema X-ray to examine your rectum, colon, and sometimes, your small intestine.  A sigmoidoscopy to examine your lower colon.  A colonoscopy to examine your entire colon. TREATMENT  Treatment will depend on the severity of your constipation and what is causing it. Some dietary treatments include drinking more fluids and eating more fiber-rich foods. Lifestyle treatments may include regular exercise. If these diet and lifestyle recommendations do not help, your caregiver may recommend taking over-the-counter laxative medicines to help you have bowel movements. Prescription medicines may be prescribed if over-the-counter medicines do not work.  HOME CARE INSTRUCTIONS   Increase dietary fiber in your diet, such as fruits, vegetables, whole grains, and beans. Limit high-fat and processed sugars in your diet, such as [place] fries, hamburgers, cookies, candies, and soda.   A fiber supplement may be added to your diet if you cannot get enough fiber from foods.   Drink enough fluids to keep your urine clear or pale yellow.   Exercise regularly or as directed by your caregiver.   Go to the restroom when you have  the urge to go. Do not hold it.  Only take medicines as directed by your caregiver. Do not take other medicines for constipation without talking to your caregiver first. [place] IF:   You have bright red blood in your stool.   Your constipation lasts for more than 4 days or gets worse.   You have abdominal or rectal pain.   You have thin, pencil-like stools.  You have unexplained weight loss. MAKE SURE YOU:   Understand these instructions.  Will watch your condition.  Will get help  right away if you are not doing well or get worse. Document Released: 09/10/2004 Document Revised: 03/06/2012 Document Reviewed: 09/24/2013 [facility] Patient Information 2014 [place], [place].

## 2014-03-30 NOTE — ED Notes (Signed)
Pt states abd cramping has decreased.

## 2014-03-30 NOTE — ED Notes (Signed)
Several steri strips left over right knee wound.  No redness, edges approximated.

## 2014-03-30 NOTE — ED Notes (Signed)
Pt discharged 03-18-14 from hospital for right TKR.  Pt had BM's until last weekend.  Pt called [facility] Thursday night to report constipation, was told to take MOM and colace.  Pt had BM after taking MOM.  Took Colace x 3 yesterday.  Started feeling weak, nauseated, dizzy and decreased appetite.  Onset this am pt started having abd cramping.  Pt took Xanax and Robaxin @ 5am.  Pt did not take anything for nausea.  Pt ate several pieces of grapes and pineapple yesterday and few sips of water.

## 2014-03-30 NOTE — ED Notes (Signed)
Pt. Stated, I had a knee replacement 2 weeks and I got impacted a week ago and I finally got out of that by taking colace and MOM but I think Im dehydrated and I have no appetite and I can't take any of my medicines.

## 2014-03-30 NOTE — ED Notes (Signed)
Pt c/o pain at IV site.  Tegadern and tape taken off, replaced tegaderm and tape pain has stopped.  IV infusing.

## 2014-04-08 ENCOUNTER — Ambulatory Visit: Payer: BC Managed Care – PPO | Attending: Orthopedic Surgery | Admitting: Physical Therapy

## 2014-04-08 DIAGNOSIS — IMO0001 Reserved for inherently not codable concepts without codable children: Secondary | ICD-10-CM | POA: Insufficient documentation

## 2014-04-08 DIAGNOSIS — M25669 Stiffness of unspecified knee, not elsewhere classified: Secondary | ICD-10-CM | POA: Insufficient documentation

## 2014-04-08 DIAGNOSIS — M25569 Pain in unspecified knee: Secondary | ICD-10-CM | POA: Insufficient documentation

## 2014-04-15 ENCOUNTER — Ambulatory Visit: Payer: BC Managed Care – PPO | Admitting: Physical Therapy

## 2014-04-18 ENCOUNTER — Ambulatory Visit: Payer: BC Managed Care – PPO | Admitting: Physical Therapy

## 2014-04-22 ENCOUNTER — Ambulatory Visit: Payer: BC Managed Care – PPO | Admitting: Physical Therapy

## 2014-04-25 ENCOUNTER — Ambulatory Visit: Payer: BC Managed Care – PPO | Admitting: Physical Therapy

## 2014-04-29 ENCOUNTER — Ambulatory Visit: Payer: BC Managed Care – PPO | Attending: Internal Medicine | Admitting: Physical Therapy

## 2014-04-29 DIAGNOSIS — M25669 Stiffness of unspecified knee, not elsewhere classified: Secondary | ICD-10-CM | POA: Insufficient documentation

## 2014-04-29 DIAGNOSIS — Z5189 Encounter for other specified aftercare: Secondary | ICD-10-CM | POA: Insufficient documentation

## 2014-04-29 DIAGNOSIS — M25569 Pain in unspecified knee: Secondary | ICD-10-CM | POA: Insufficient documentation

## 2014-05-02 ENCOUNTER — Ambulatory Visit: Payer: BC Managed Care – PPO | Admitting: Physical Therapy

## 2014-05-06 ENCOUNTER — Ambulatory Visit: Payer: BC Managed Care – PPO | Admitting: Physical Therapy

## 2014-05-09 ENCOUNTER — Ambulatory Visit: Payer: BC Managed Care – PPO | Admitting: Physical Therapy

## 2014-05-14 ENCOUNTER — Ambulatory Visit: Payer: BC Managed Care – PPO

## 2014-05-14 ENCOUNTER — Encounter: Payer: BC Managed Care – PPO | Admitting: Physical Therapy

## 2014-05-16 ENCOUNTER — Ambulatory Visit: Payer: BC Managed Care – PPO | Admitting: Physical Therapy

## 2014-05-21 ENCOUNTER — Ambulatory Visit: Payer: BC Managed Care – PPO | Admitting: Physical Therapy

## 2014-05-23 ENCOUNTER — Ambulatory Visit: Payer: BC Managed Care – PPO | Admitting: Physical Therapy

## 2014-05-27 ENCOUNTER — Ambulatory Visit: Payer: BC Managed Care – PPO | Attending: Internal Medicine | Admitting: Physical Therapy

## 2014-05-27 DIAGNOSIS — M25669 Stiffness of unspecified knee, not elsewhere classified: Secondary | ICD-10-CM | POA: Insufficient documentation

## 2014-05-27 DIAGNOSIS — Z5189 Encounter for other specified aftercare: Secondary | ICD-10-CM | POA: Insufficient documentation

## 2014-05-27 DIAGNOSIS — M25569 Pain in unspecified knee: Secondary | ICD-10-CM | POA: Insufficient documentation

## 2014-05-28 ENCOUNTER — Encounter: Payer: BC Managed Care – PPO | Admitting: Physical Therapy

## 2014-05-30 ENCOUNTER — Ambulatory Visit: Payer: BC Managed Care – PPO | Admitting: Physical Therapy

## 2014-06-03 ENCOUNTER — Ambulatory Visit: Payer: BC Managed Care – PPO | Admitting: Physical Therapy

## 2014-06-06 ENCOUNTER — Ambulatory Visit: Payer: BC Managed Care – PPO | Admitting: Physical Therapy

## 2014-06-10 ENCOUNTER — Ambulatory Visit: Payer: BC Managed Care – PPO | Admitting: Physical Therapy

## 2014-06-13 ENCOUNTER — Ambulatory Visit: Payer: BC Managed Care – PPO | Admitting: Physical Therapy

## 2014-06-17 ENCOUNTER — Ambulatory Visit: Payer: BC Managed Care – PPO | Admitting: Physical Therapy

## 2014-06-24 ENCOUNTER — Ambulatory Visit: Payer: BC Managed Care – PPO | Admitting: Physical Therapy

## 2014-07-01 ENCOUNTER — Ambulatory Visit: Payer: BC Managed Care – PPO | Attending: Internal Medicine | Admitting: Physical Therapy

## 2014-07-01 DIAGNOSIS — M25569 Pain in unspecified knee: Secondary | ICD-10-CM | POA: Insufficient documentation

## 2014-07-01 DIAGNOSIS — M25669 Stiffness of unspecified knee, not elsewhere classified: Secondary | ICD-10-CM | POA: Insufficient documentation

## 2014-07-01 DIAGNOSIS — Z5189 Encounter for other specified aftercare: Secondary | ICD-10-CM | POA: Insufficient documentation

## 2014-07-04 ENCOUNTER — Encounter: Payer: BC Managed Care – PPO | Admitting: Rehabilitation

## 2014-07-05 ENCOUNTER — Ambulatory Visit: Payer: BC Managed Care – PPO | Admitting: Physical Therapy

## 2014-07-08 ENCOUNTER — Ambulatory Visit: Payer: BC Managed Care – PPO | Admitting: Physical Therapy

## 2014-07-12 ENCOUNTER — Ambulatory Visit: Payer: BC Managed Care – PPO | Admitting: Physical Therapy

## 2014-07-16 ENCOUNTER — Ambulatory Visit: Payer: BC Managed Care – PPO | Admitting: Physical Therapy

## 2014-07-18 ENCOUNTER — Ambulatory Visit: Payer: BC Managed Care – PPO | Admitting: Physical Therapy

## 2014-07-23 ENCOUNTER — Ambulatory Visit: Payer: BC Managed Care – PPO | Admitting: Physical Therapy

## 2014-07-26 ENCOUNTER — Ambulatory Visit: Payer: BC Managed Care – PPO | Admitting: Physical Therapy

## 2014-09-26 ENCOUNTER — Encounter: Payer: Self-pay | Admitting: Hematology & Oncology

## 2014-09-30 ENCOUNTER — Telehealth: Payer: Self-pay | Admitting: Diagnostic Neuroimaging

## 2014-09-30 ENCOUNTER — Telehealth: Payer: Self-pay | Admitting: *Deleted

## 2014-09-30 NOTE — Telephone Encounter (Signed)
Pt called saw Dr Leta Baptist 2014 for tinnitus still having same problem but also having sleep issues and wants to see Dr Brett Fairy would pt need to f/u with Dr Leta Baptist for tinnitus and then he could refer her to Dr Brett Fairy for sleep issues please call pt dg

## 2014-09-30 NOTE — Telephone Encounter (Signed)
Dr. Brett Fairy there is a letter in your pod from the patient. I told her she would need a referral from either Dr. Tish Frederickson or her ENT.

## 2014-09-30 NOTE — Telephone Encounter (Signed)
Left detailed message: MRI from 2014 results were reviewed at that time. If she wants to discuss new issues, may offer follow up visit with me or Jeani Hawking. Otherwise, follow up with ENT and PCP. See notes in [facility] EMR. -VRP.   Patient can have PCP send new referral re: sleep.

## 2014-09-30 NOTE — Telephone Encounter (Signed)
Patient calling requesting an appointment with Dr Brett Fairy, she is a patient of Dr Leta Baptist, offered follow up with [facility] or NP, patient refused informed patient that she would have to write a letter stating why she request change in doctors. Patient states that she will be in today with the letter to switch doctors.

## 2014-10-02 NOTE — Telephone Encounter (Signed)
I saw patient for tinnitus 1 time in 2014, and she has not followed up with. If she wants a sleep consultation, then she should discuss with her PCP to refer her for evaluation. -VRP

## 2014-10-03 NOTE — Telephone Encounter (Signed)
Returned patient's call. She wanted to know if Dr. Brett Fairy would be able to help her get off sleep meds when she comes to visit with her. Advised patient to speak with who is referring her for sleep (ENT) as far as including patient's goal in referring notes. Patient agreed.

## 2014-10-03 NOTE — Telephone Encounter (Signed)
Patient returning call to [facility], please call and advise.

## 2014-10-03 NOTE — Telephone Encounter (Signed)
Spoke to patient. Went over referral process. Patient says she has had ENT to send referral for sleep.

## 2014-10-03 NOTE — Telephone Encounter (Signed)
Called patient. Left  a detailed message, per Dr. Gladstone Lighter previous note, on patient's vmail that ID's patient first and last name.

## 2014-10-03 NOTE — Telephone Encounter (Signed)
Patient returning call to [facility], please call her back and advise.

## 2014-10-07 ENCOUNTER — Telehealth: Payer: Self-pay | Admitting: Hematology & Oncology

## 2014-10-07 ENCOUNTER — Telehealth: Payer: Self-pay | Admitting: Neurology

## 2014-10-07 NOTE — Telephone Encounter (Signed)
I spoke w NEW PATIENT today to remind them of their appointment with Dr. Ennever. Also, advised them to bring all medication bottles and insurance card information. ° °

## 2014-10-07 NOTE — Telephone Encounter (Signed)
Patient was told to call back today if she hadn't heard from our office.  Patient was trying to get appointment with Dr. Brett Fairy.

## 2014-10-08 ENCOUNTER — Ambulatory Visit: Payer: BC Managed Care – PPO

## 2014-10-08 ENCOUNTER — Encounter: Payer: Self-pay | Admitting: Hematology & Oncology

## 2014-10-08 ENCOUNTER — Ambulatory Visit (HOSPITAL_BASED_OUTPATIENT_CLINIC_OR_DEPARTMENT_OTHER): Payer: BC Managed Care – PPO | Admitting: Hematology & Oncology

## 2014-10-08 ENCOUNTER — Other Ambulatory Visit (HOSPITAL_BASED_OUTPATIENT_CLINIC_OR_DEPARTMENT_OTHER): Payer: BC Managed Care – PPO | Admitting: Lab

## 2014-10-08 VITALS — BP 152/58 | HR 71 | Temp 97.5°F | Resp 14 | Ht 64.0 in | Wt 184.0 lb

## 2014-10-08 DIAGNOSIS — H9313 Tinnitus, bilateral: Secondary | ICD-10-CM

## 2014-10-08 DIAGNOSIS — D696 Thrombocytopenia, unspecified: Secondary | ICD-10-CM

## 2014-10-08 DIAGNOSIS — D693 Immune thrombocytopenic purpura: Secondary | ICD-10-CM

## 2014-10-08 LAB — CBC WITH DIFFERENTIAL (CANCER CENTER ONLY)
BASO#: 0 10*3/uL (ref 0.0–0.2)
BASO%: 0.3 % (ref 0.0–2.0)
EOS ABS: 0.1 10*3/uL (ref 0.0–0.5)
EOS%: 1.1 % (ref 0.0–7.0)
HCT: 40.1 % (ref 34.8–46.6)
HGB: 13.7 g/dL (ref 11.6–15.9)
LYMPH#: 1.7 10*3/uL (ref 0.9–3.3)
LYMPH%: 26.9 % (ref 14.0–48.0)
MCH: 30 pg (ref 26.0–34.0)
MCHC: 34.2 g/dL (ref 32.0–36.0)
MCV: 88 fL (ref 81–101)
MONO#: 0.3 10*3/uL (ref 0.1–0.9)
MONO%: 5.4 % (ref 0.0–13.0)
NEUT#: 4.1 10*3/uL (ref 1.5–6.5)
NEUT%: 66.3 % (ref 39.6–80.0)
RBC: 4.56 10*6/uL (ref 3.70–5.32)
RDW: 13.9 % (ref 11.1–15.7)
WBC: 6.1 10*3/uL (ref 3.9–10.0)

## 2014-10-08 LAB — CHCC SATELLITE - SMEAR

## 2014-10-08 MED ORDER — INFLUENZA VAC SPLIT QUAD 0.5 ML IM SUSY
0.5000 mL | PREFILLED_SYRINGE | Freq: Once | INTRAMUSCULAR | Status: DC
  Filled 2014-10-08: qty 0.5

## 2014-10-08 NOTE — Progress Notes (Signed)
Referral MD  Reason for Referral: Thrombocytopenia   Chief Complaint  Patient presents with  . NEW PATIENT  : My platelets are low.  HPI: Gabrielle [name] is a very charming 63 year old Afro-American female. She is the sister of one of my patients.  She's been having problems with tinnitus. She is seeing different doctors. She's had procedures for this.  She is followed by Dr. Karlton Lemon. Unfortunately, the [facility] has left practice.  Gabrielle [name] has noted that her platelets have been low.  She's had no bleeding or bruising.  She's lost probably 100 pounds. She says when she is nervous and anxious, she does not eat. The tinnitus has made her incredibly anxious.   She had knee surgery on the right knee back in the spring time. She did well. I think that preop, her platelet count was probably 113. Postoperative went down to 98. That went back up to 146. Some of that increase with polyp bullae acute phase reactant and also some iron deficiency.  Going back to 2010, her platelet count was 112,000. She was not anemic or leukopenic.  She's had no abdominal pain. There's been no swollen nodes. She gets her mammograms yearly. She's had no sweats. She's had no leg swelling. She's had no rashes. She's on occasion, she may note some small pinpoint rash on her lower legs. She is not a vegetarian. She's had no change in medications. She does not like taking medications. She does take supplements.                  Past Medical History  Diagnosis Date  . Insomnia     takes Trazodone nightly as needed and Ambien nightly   . Tinnitus     takes HCTZ daily to decrease pressure in ears  . Hearing loss     but doesn't have hearing aids  . Hypertension     hasn't been on meds for the past 89yr   . History of bronchitis     many many yrs ago  . Dizziness     rarely  . Arthritis   . Joint pain   . Joint swelling   . GERD (gastroesophageal reflux disease)     takes Omeprazole every  other day  . Constipation     will occasionally take Milk of Mag  . History of colon polyps   . History of colitis   . Diverticulosis   . Anemia     as a child  . Cataracts, bilateral     immature  . Glaucoma     borderline and no drops required  . Anxiety     takes Xanax daily  . Complication of anesthesia   . PONV (postoperative nausea and vomiting)   :  Past Surgical History  Procedure Laterality Date  . Abdominal hysterectomy    . Tonsillectomy    . Appendectomy    . Tubal ligation    . Colonoscopy    . Total knee arthroplasty Right 03/13/2014    DR MURPHY  . Total knee arthroplasty Right 03/13/2014    Procedure: TOTAL KNEE ARTHROPLASTY;  Surgeon: DNinetta Lights MD;  Location[facility]  Service: Orthopedics;  Laterality: Right;  :  Current outpatient prescriptions:ALPRAZolam (XANAX) 1 MG tablet, Take 5 mg by mouth 2 (two) times daily as needed for anxiety or sleep (tinnitus). , Disp: , Rfl: ;  omeprazole (PRILOSEC OTC) 20 MG tablet, Take 20 mg by mouth as needed. , Disp: , Rfl: ;  polyethylene glycol (MIRALAX) packet, Take 17 g by mouth daily., Disp: , Rfl: ;  promethazine (PHENERGAN) 25 MG tablet, Take 12.5 mg by mouth every morning., Disp: , Rfl:  zolpidem (AMBIEN) 10 MG tablet, Take 10 mg by mouth at bedtime. Takes 1 1/2 tab at bedtime, Disp: , Rfl:  Current facility-administered medications:Influenza vac split quadrivalent PF (FLUARIX) injection 0.5 mL, 0.5 mL, Intramuscular, Once, [name], MD:  . Influenza vac split quadrivalent PF  0.5 mL Intramuscular Once  :  Allergies  Allergen Reactions  . Contrast Media [Iodinated Diagnostic Agents] Hives  . Hydrocodone Other (See Comments)    insomnia  :  No family history on file.:  History   Social History  . Marital Status: Divorced    Spouse Name: N/A    Number of Children: N/A  . Years of Education: N/A   Occupational History  . Not on file.   Social History Main Topics  . Smoking status: Former  Smoker -- 1.00 packs/day for 22 years    Types: Cigarettes    Start date: 03/08/1972    Quit date: 01/08/1994  . Smokeless tobacco: Never Used     Comment: quit smoking 37yr ago  . Alcohol Use: No  . Drug Use: No  . Sexual Activity: Not on file   Other Topics Concern  . Not on file   Social History Narrative  . No narrative on file  :  Pertinent items are noted in HPI.  Exam: _0 @  well-developed and well-nourished African female. Her vital signs show a temperature of 97.5. Pulse 71. Blood pressure 150/50. Weight is 184 pounds. Head exam shows a normal normocephalic atraumatic still. She has no adenopathy in the neck. There is no sclera icterus. There is no intraoral lesions. Thyroid is not palpable. Lungs are clear. Cardiac exam regular in rhythm with no murmurs, rubs or bruits. Abdomen is soft. She has good bowel sounds. She has no fluid wave. There is no palpable liver and spleen tip. Extremities shows no clubbing, cyanosis or edema. She has good range of motion of her joints. There is no joint swelling. Back exam no tenderness over the spine, ribs or hips. Skin exam no rashes, ecchymosis or petechia. Neurological exam is nonfocal.    Recent Labs  10/08/14 1154  WBC 6.1  HGB 13.7  HCT 40.1  PLT 107 Platelet count consistent in citrate*   No results found for this basename: NA, K, CL, CO2, GLUCOSE, BUN, CREATININE, CALCIUM,  in the last 72 hours  Blood smear review: Normochromic and normocytic red blood cells. There is no target cells. There is no nucleated red blood cells. There is no teardrop cells. She has no schistocytes or spherocytes. White cells are normal in morphology and maturation. There is no immature myeloid or lymphoid forms. There is no hypersegmented polys. Platelets are minimally decreased number. Platelets are well granulated. Platelets are somewhat large in size.  Pathology: None     Assessment and Plan: 63year old African female. She is  thrombocytopenia. This obviously has been going on for 5 years. Lab work back in 2010 showed some mild thrombocytopenia.  Her blood smear is relatively benign. I don't see anything on the blood smear to look suspicious.  I have to suspect that she probably has mild thrombocytopenia that is immune-based. This could be mild ITP.  I'll see any significant bone marrow disorder. I don't see any obvious malignant bone marrow problem.  She does not need a bone marrow biopsy.  I don't see that we have to do any scans on her.  Is no risk factors for hepatitis B or hepatitis C or HIV. I don't think these have to be checked.  I think that we can just watch her. I don't see that any medications that she is on would be a problem.  I spent about one hour with she and her brother. I answered all of her questions. I reviewed her lab work with her.  She definitely needs a family doctor. We will see about referring her to a family doctor for regular in general medical problems.

## 2014-10-09 ENCOUNTER — Telehealth: Payer: Self-pay | Admitting: Hematology & Oncology

## 2014-10-09 NOTE — Telephone Encounter (Signed)
Mailed December schedule °

## 2014-10-11 ENCOUNTER — Encounter: Payer: Self-pay | Admitting: Neurology

## 2014-10-11 ENCOUNTER — Ambulatory Visit (INDEPENDENT_AMBULATORY_CARE_PROVIDER_SITE_OTHER): Payer: BC Managed Care – PPO | Admitting: Neurology

## 2014-10-11 VITALS — BP 118/73 | HR 72 | Temp 98.1°F | Resp 12 | Ht 64.0 in | Wt 189.0 lb

## 2014-10-11 DIAGNOSIS — H9311 Tinnitus, right ear: Secondary | ICD-10-CM

## 2014-10-11 DIAGNOSIS — F5104 Psychophysiologic insomnia: Secondary | ICD-10-CM

## 2014-10-11 DIAGNOSIS — F419 Anxiety disorder, unspecified: Secondary | ICD-10-CM

## 2014-10-11 DIAGNOSIS — F19982 Other psychoactive substance use, unspecified with psychoactive substance-induced sleep disorder: Secondary | ICD-10-CM | POA: Insufficient documentation

## 2014-10-11 DIAGNOSIS — M2619 Other specified anomalies of jaw-cranial base relationship: Secondary | ICD-10-CM

## 2014-10-11 DIAGNOSIS — IMO0002 Reserved for concepts with insufficient information to code with codable children: Secondary | ICD-10-CM

## 2014-10-11 DIAGNOSIS — G4700 Insomnia, unspecified: Secondary | ICD-10-CM

## 2014-10-11 DIAGNOSIS — F5105 Insomnia due to other mental disorder: Secondary | ICD-10-CM

## 2014-10-11 DIAGNOSIS — R0683 Snoring: Secondary | ICD-10-CM

## 2014-10-11 HISTORY — DX: Psychophysiologic insomnia: F51.04

## 2014-10-11 HISTORY — DX: Reserved for concepts with insufficient information to code with codable children: IMO0002

## 2014-10-11 NOTE — Progress Notes (Addendum)
Provider:  Larey [name], M D  Referring Provider: Willey Blade, MD Primary Care Physician:  Gabrielle Greenland, MD  Chief Complaint  Patient presents with  . NP Gabrielle [name]    Sleep, ROom 11, alone    HPI:  Gabrielle [name] is a 63 y.o. female, who is seen here as a referral  from Dr. Constance [name], Dr.  Karlton [name] , Dr Laurann [place].     [name] has seen my colleague Dr. Leta [name] for tinnitus, and spoken to him several times by phone in the last year.  She is mainly bothered by tinnitus. She retired meanwhile and has "ringing " in her ear for a long time. 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  The patient reports that she has been using Ambien and Xanax as sleep aids for several years. Over the last 2 years her Ambien use went from 10 mg at night to 20 mg at night and she couldn't medical decision to reduce the Ambien to 1-1/2 tablets just recently by herself. She also takes a second dorsal Xanax 1 mg and a half Ambien of 5 mg at about 3 AM in the morning for the second half of the night. Her medication list does not in include any medications nor to cause insomnia. The insomnia has been chronic and the medications don't work anymore. .  She is using now but recheck receptor medication and Zoloft Ambien and benzodiazepines device at night she takes 1 mg of Xanax at the beginning of the night, with 15 mg Ambien and 1 mg xanax and 5 mg Ambien in AM.    She is certainly addicted at this time, and a reduction in sleep aids lets to insomnia, rebound. She attributes all her insomnia to the tinnitus. She was even taking Dilaudid at one point.   She highly anxious and the tinnitus seems to be what caused the anxiety and the first place she also said that she has lost appetite he lost over 60 pounds. She needs a referral to behavior  therapy, cognitive approach to deal with pulsatile tinnitus and insomnia.      Review of Systems: Out of a complete 14 system review, the patient complains of only the following symptoms, and all other reviewed systems are negative. Insomnia, memory loss, sleep aid and benzodiazepine addiction.    History   Social History  . Marital Status: Divorced    Spouse Name: N/A    Number of Children: N/A  . Years of Education: N/A   Occupational History  . Not on file.   Social History Main Topics  . Smoking status: Former Smoker -- 1.00 packs/day for 22 years    Types: Cigarettes    Start date: 03/08/1972    Quit date: 01/08/1994  . Smokeless tobacco: Never Used     Comment: quit smoking 44yrs ago  . Alcohol Use: No  . Drug Use: No  . Sexual Activity: Not on file   Other Topics Concern  . 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  Past Medical History  Diagnosis Date  . Insomnia     takes Trazodone nightly as needed and Ambien  nightly   . Tinnitus     takes HCTZ daily to decrease pressure in ears  . Hearing loss     but doesn't have hearing aids  . Hypertension     hasn't been on meds for the past 40yrs   . History of bronchitis     many many yrs ago  . Dizziness     rarely  . Arthritis   . Joint pain   . Joint swelling   . GERD (gastroesophageal reflux disease)     takes Omeprazole every other day  . Constipation     will occasionally take Milk of Mag  . History of colon polyps   . History of colitis   . Diverticulosis   . Anemia     as a child  . Cataracts, bilateral     immature  . Glaucoma     borderline and no drops required  . Anxiety     takes Xanax daily  . Complication of anesthesia   . PONV (postoperative nausea and vomiting)     Past Surgical History  Procedure Laterality Date  . Abdominal hysterectomy    .  Tonsillectomy    . Appendectomy    . Tubal ligation    . Colonoscopy    . Total knee arthroplasty Right 03/13/2014    DR MURPHY  . Total knee arthroplasty Right 03/13/2014    Procedure: TOTAL KNEE ARTHROPLASTY;  Surgeon: [name], MD;  Location: [facility];  Service: Orthopedics;  Laterality: Right;    Current Outpatient Prescriptions  Medication Sig Dispense Refill  . ALPRAZolam (XANAX) 1 MG tablet Take 5 mg by mouth 2 (two) times daily as needed for anxiety or sleep (tinnitus).       . cholecalciferol (VITAMIN D) 1000 UNITS tablet Take 2,000 Units by mouth daily.      Marland Kitchen omeprazole (PRILOSEC OTC) 20 MG tablet Take 20 mg by mouth as needed.       . polyethylene glycol (MIRALAX) packet Take 17 g by mouth daily as needed.       . promethazine (PHENERGAN) 25 MG tablet Take 12.5 mg by mouth every morning.      . zolpidem (AMBIEN) 10 MG tablet Take 10 mg by mouth at bedtime. Takes 1 1/2 tab at bedtime       No current facility-administered medications for this visit.    Allergies as of 10/11/2014 - Review Complete 10/11/2014  Allergen Reaction Noted  . Contrast media [iodinated diagnostic agents] Hives 06/13/2012  . Hydrocodone Other (See Comments) 02/27/2014    Vitals: BP 118/73  Pulse 72  Temp(Src) 98.1 F (36.7 C) (Oral)  Resp 12  Ht 5\' 4"  (1.626 m)  Wt 189 lb (85.73 kg)  BMI 32.43 kg/m2 Last Weight:  Wt Readings from Last 1 Encounters:  10/11/14 189 lb (85.73 kg)   Last Height:   Ht Readings from Last 1 Encounters:  10/11/14 5\' 4"  (1.626 m)    Physical exam:  General: The patient is awake, alert and appears not in acute distress. The patient is well groomed. Head: Normocephalic, atraumatic.  Neck is supple. Mallampati  2, neck circumference: 14 inches.  Retrognathia. Lots of phlegm . Cardiovascular:  Regular rate and rhythm , without  murmurs or carotid bruit, and without distended neck veins. Respiratory: Lungs are clear to auscultation. Skin:  Without evidence  of edema, or rash Trunk: BMI is  elevated and patient  has normal posture.  Neurologic exam : The patient  is awake and alert,  Talkative, logorrheic oriented to place and time.   Memory subjective  described as impaired - I believe this is medication related.  There is a normal attention span & concentration ability. Speech is fluent without dysarthria, dysphonia or aphasia. Mood and affect are anxious, slightly agitated.   Cranial nerves: Pupils are equal and briskly reactive to light. Funduscopic exam without evidence of pallor or edema. Extraocular movements  in vertical and horizontal planes intact and without nystagmus.  The patient has a left lazy eye, with hyper adduction.  Visual fields by finger perimetry are intact. Hearing to finger rub intact.  Facial sensation intact to fine touch. Facial motor strength is symmetric and tongue and uvula move midline. Tongue protrusion into either cheek is normal. Shoulder shrug is normal.   Motor exam:   Normal tone, muscle bulk and symmetric  strength in all extremities.  Sensory:  Fine touch, pinprick and vibration were tested in all extremities. Proprioception was normal.  Coordination: Rapid alternating movements in the fingers/hands were normal. Finger-to-nose maneuver normal without evidence of ataxia, dysmetria or tremor.  Gait and station: Patient walks without assistive device and is able unassisted to climb up to the exam table.  Strength within normal limits. Stance is stable and normal. Tandem gait is unfragmented. Romberg testing is  negative   Deep tendon reflexes: in the  upper and lower extremities are symmetric and intact. Babinski maneuver response is  downgoing.   Assessment:  After physical and neurologic examination, review of laboratory studies, imaging, neurophysiology testing and pre-existing records, assessment is that of :  1) the patient clearly stated that her tinnitus, subjective tinnitus with clicking and now  recently with pulsation - have left her unable to sleep and she started medicating for this condition, a secondary Insomnia.  She is now dependent on 2 drugs. "to block out the tinnitus " . 2) She has to be referred to behaviour therapy - this needs a psychiatrist to wean her off the medication and not provoke anxiety, panic or withdraw. 3) She has no need for a sleep neurologist physcian and has an established neurologist in Dr. Leta [name].   She needs a psychiatrist .   Plan:  Treatment plan and additional workup : I can not order a PSG based on the clinical symptoms. Insomnia will prevent finding valid data  In a PSG study. I discussed with her to obtain a PSG on medications, to see if she has an organic reason to wake up at 3-4 AM nightly, if not she will follow up with  The referring physician.  Her long time  PCP left the practice and she is not happy with her current PCP.  She is unwilling to use Paxil or Zoloft as replacements, and i explained that I do not wean patients or maintain medication I have not initiated.         Asencion Partridge Kayleena Eke MD 10/11/2014

## 2014-10-11 NOTE — Patient Instructions (Signed)
insomniaInsomnia Insomnia is frequent trouble falling and/or staying asleep. Insomnia can be a long term problem or a short term problem. Both are common. Insomnia can be a short term problem when the wakefulness is related to a certain stress or worry. Long term insomnia is often related to ongoing stress during waking hours and/or poor sleeping habits. Overtime, sleep deprivation itself can make the problem worse. Every little thing feels more severe because you are overtired and your ability to cope is decreased. CAUSES   Stress, anxiety, and depression.  Poor sleeping habits.  Distractions such as TV in the bedroom.  Naps close to bedtime.  Engaging in emotionally charged conversations before bed.  Technical reading before sleep.  Alcohol and other sedatives. They may make the problem worse. They can hurt normal sleep patterns and normal dream activity.  Stimulants such as caffeine for several hours prior to bedtime.  Pain syndromes and shortness of breath can cause insomnia.  Exercise late at night.  Changing time zones may cause sleeping problems (jet lag). It is sometimes helpful to have someone observe your sleeping patterns. They should look for periods of not breathing during the night (sleep apnea). They should also look to see how long those periods last. If you live alone or observers are uncertain, you can also be observed at a sleep clinic where your sleep patterns will be professionally monitored. Sleep apnea requires a checkup and treatment. Give your caregivers your medical history. Give your caregivers observations your family has made about your sleep.  SYMPTOMS   Not feeling rested in the morning.  Anxiety and restlessness at bedtime.  Difficulty falling and staying asleep. TREATMENT   Your caregiver may prescribe treatment for an underlying medical disorders. Your caregiver can give advice or help if you are using alcohol or other drugs for self-medication.  Treatment of underlying problems will usually eliminate insomnia problems.  Medications can be prescribed for short time use. They are generally not recommended for lengthy use.  Over-the-counter sleep medicines are not recommended for lengthy use. They can be habit forming.  You can promote easier sleeping by making lifestyle changes such as:  Using relaxation techniques that help with breathing and reduce muscle tension.  Exercising earlier in the day.  Changing your diet and the time of your last meal. No night time snacks.  Establish a regular time to go to bed.  Counseling can help with stressful problems and worry.  Soothing music and white noise may be helpful if there are background noises you cannot remove.  Stop tedious detailed work at least one hour before bedtime. HOME CARE INSTRUCTIONS   Keep a diary. Inform your caregiver about your progress. This includes any medication side effects. See your caregiver regularly. Take note of:  Times when you are asleep.  Times when you are awake during the night.  The quality of your sleep.  How you feel the next day. This information will help your caregiver care for you.  Get out of bed if you are still awake after 15 minutes. Read or do some quiet activity. Keep the lights down. Wait until you feel sleepy and go back to bed.  Keep regular sleeping and waking hours. Avoid naps.  Exercise regularly.  Avoid distractions at bedtime. Distractions include watching television or engaging in any intense or detailed activity like attempting to balance the household checkbook.  Develop a bedtime ritual. Keep a familiar routine of bathing, brushing your teeth, climbing into bed at the same  time each night, listening to soothing music. Routines increase the success of falling to sleep faster.  Use relaxation techniques. This can be using breathing and muscle tension release routines. It can also include visualizing peaceful scenes.  You can also help control troubling or intruding thoughts by keeping your mind occupied with boring or repetitive thoughts like the old concept of counting sheep. You can make it more creative like imagining planting one beautiful flower after another in your backyard garden.  During your day, work to eliminate stress. When this is not possible use some of the previous suggestions to help reduce the anxiety that accompanies stressful situations. MAKE SURE YOU:   Understand these instructions.  Will watch your condition.  Will get help right away if you are not doing well or get worse. Document Released: 12/10/2000 Document Revised: 03/06/2012 Document Reviewed: 01/10/2008 [facility] Patient Information 2015 [place], [place]. This information is not intended to replace advice given to you by your health care provider. Make sure you discuss any questions you have with your health care provider.

## 2014-10-28 ENCOUNTER — Ambulatory Visit (HOSPITAL_COMMUNITY): Payer: BC Managed Care – PPO | Admitting: Psychiatry

## 2014-11-05 ENCOUNTER — Ambulatory Visit (INDEPENDENT_AMBULATORY_CARE_PROVIDER_SITE_OTHER): Payer: BC Managed Care – PPO | Admitting: Psychiatry

## 2014-11-05 ENCOUNTER — Encounter (HOSPITAL_COMMUNITY): Payer: Self-pay | Admitting: Psychiatry

## 2014-11-05 VITALS — BP 105/65 | HR 72 | Ht 64.0 in | Wt 193.6 lb

## 2014-11-05 DIAGNOSIS — F411 Generalized anxiety disorder: Secondary | ICD-10-CM

## 2014-11-05 DIAGNOSIS — F329 Major depressive disorder, single episode, unspecified: Secondary | ICD-10-CM

## 2014-11-05 DIAGNOSIS — G4700 Insomnia, unspecified: Secondary | ICD-10-CM

## 2014-11-05 MED ORDER — MIRTAZAPINE 15 MG PO TBDP: 15.0000 mg | ORAL_TABLET | Freq: Every day | ORAL | Status: DC

## 2014-11-05 MED ORDER — CLONAZEPAM 1 MG PO TABS: ORAL_TABLET | ORAL | Status: DC

## 2014-11-05 NOTE — Patient Instructions (Signed)
Stop taking Xanax and Ambien.  Start Klonopin 1 mg half tablet at bedtime and repeat again if needed and other half tablet. Start Remeron 15 mg at bedtime which helps depression and anxiety and insomnia.  Please call us if you have any question or any concern.  I will see her again in 2-3 weeks.

## 2014-11-05 NOTE — Progress Notes (Signed)
[facility] Initial Assessment Note  KATRYNA TSCHIRHART [id number] 63 y.o.  11/05/2014 10:00 AM  Chief Complaint:  I'm taking Xanax and Ambien and I cannot sleep.  I have tinnitus who took over my life.  History of Present Illness:  Patient is 63 year old African-American divorced employed female came for her initial appointment.  She was referred from her primary care physician and her mother who is also a patient in this office.  Patient reported that for past 2 years she has insomnia, anxiety and depression.  She remember having depression and anxiety for a long time but symptoms started to get worse when she developed tinnitus in 2013.   She developed insomnia, anxiety, racing thoughts and depression.  She had tried multiple medication to help him of symptoms but did not get better and her ENT specialist started to giving her Xanax which she initially took only as needed but the past 2 years she's been taking in a higher dose and it is not working.  She is taking Xanax 1 mg which she takes at 7:30 before go to bed and then she takes another one at 2:30 because she wakes up.  She is also taking Ambien and she had tried 25 mg Ambien together but sat next.  Despite taking higher dose of Ambien and Xanax she is only sleeping 2-4 hours.  In March she has right knee replacement and that also causes worsening of her anxiety and depression.  She endorse crying spells, decreased social life, feeling hopeless helpless and worthless.  She is unable to do multitasking and sometime feels burden to other people.  She consider her knee replacement was failure because she still have a lot of pain.  She decided to retire after tinnitus however recently she picked up part-time work to cut she wants to keep her busy.  She admitted difficulty doing her work because she get irritable and angry.  She endorse crying spells and racing thoughts.  She endorsed low self-esteem, sadness, discouragement and some time  and decisiveness.  She is not happy because her daughter who lives close by does not involved in her social life.  Patient admitted loss of appetite and an past 2 years she has lost more than 60 pounds.  She denies any active or passive suicidal thoughts or homicidal thoughts but admitted anhedonia and feeling of hopelessness.  She is angry on her doctors because despite ENT surgery and knee replacement she still have symptoms and she is not getting better.  Patient admitted irritability and anger however denies any violence, aggression.  She denies any paranoia, hallucination, mania or any panic attacks.  She denies any nightmares or any flashback.  She wants to try a different medication to help insomnia, anxiety and depression.  Suicidal Ideation: No Plan Formed: No Patient has means to carry out plan: No  Homicidal Ideation: No Plan Formed: No Patient has means to carry out plan: No  Past Psychiatric History/Hospitalization(s) Patient denies any previous history of psychiatric inpatient treatment.  She was given Paxil by a physician assistant however she took only 2 doses and to stop.  She is taking Xanax and Ambien which is prescribed by her ENT specialist.  Patient denies any history of mania, psychosis, hallucination or any aggressive behavior.  She denies any history of PTSD, obsessive-compulsive thoughts or any panic attacks.  She denies any history of suicidal attempt. Anxiety: Yes Bipolar Disorder: No Depression: Yes Mania: No Psychosis: No Schizophrenia: No Personality Disorder: No Hospitalization  for psychiatric illness: No History of Electroconvulsive Shock Therapy: No Prior Suicide Attempts: No  Medical History; Patient has tinnitus, hearing loss, hypertension, osteoarthritis, GERD, glaucoma, tonsillectomy, right total knee replacement, arthroplasty and appendectomy.  Her primary care physician is Dr. Shirlean Mylar at [facility].  Traumatic brain injury: Patient denies any  history of traumatic brain injury.  Family History; Patient endorse mother has depression.  Her mother is a patient in this office.  Education and Work History; Patient is a high Printmaker.  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  She was retired 2 years ago but recently picked up part-time job.  Psychosocial History; Patient born and raised in [place].  She is divorced from her husband because her husband was involved in extramarital affairs.  She has 2 daughters she raised by herself.  One of her daughter lives in [place] and other daughter lives close by.  Patient lives by herself.  Legal History; Patient denies any legal issues.  History Of Abuse; Patient denies any history of abuse.  Substance Abuse History; Patient denies any history of drinking alcohol or any substance use.   Review of Systems: Psychiatric: Agitation: No Hallucination: No Depressed Mood: Yes Insomnia: Yes Hypersomnia: No Altered Concentration: No Feels Worthless: Yes Grandiose Ideas: No Belief In Special Powers: No New/Increased Substance Abuse: No Compulsions: No  Neurologic: Headache: No Seizure: No Paresthesias: No   Musculoskeletal: Strength & Muscle Tone: within normal limits Gait & Station: unsteady, due to tinnitus and right knee painpatient has difficulty walking Patient leans: N/A   Outpatient Encounter Prescriptions as of 11/05/2014  Medication Sig  . meloxicam (MOBIC) 7.5 MG tablet Take 7.5 mg by mouth daily.  . traMADol (ULTRAM) 50 MG tablet Take by mouth every 6 (six) hours as needed.  . cholecalciferol (VITAMIN D) 1000 UNITS tablet Take 2,000 Units by mouth daily.  . clonazePAM (KLONOPIN) 1 MG tablet Take 1/2 tab at bed time and 1/2 needed again if needed  . mirtazapine (REMERON SOL-TAB) 15 MG disintegrating tablet Take 1 tablet (15 mg total) by mouth at bedtime.  Marland Kitchen omeprazole (PRILOSEC OTC) 20 MG tablet Take 20 mg by mouth as  needed.   . polyethylene glycol (MIRALAX) packet Take 17 g by mouth daily as needed.   . [DISCONTINUED] ALPRAZolam (XANAX) 1 MG tablet Take 5 mg by mouth 2 (two) times daily as needed for anxiety or sleep (tinnitus).   . [DISCONTINUED] promethazine (PHENERGAN) 25 MG tablet Take 12.5 mg by mouth every morning.  . [DISCONTINUED] zolpidem (AMBIEN) 10 MG tablet Take 10 mg by mouth at bedtime. Takes 1 1/2 tab at bedtime    Recent Results (from the past 2160 hour(s))  CBC with Differential [facility])     Status: Abnormal   Collection Time: 10/08/14 11:54 AM  Result Value Ref Range   WBC 6.1 3.9 - 10.0 10e3/uL   RBC 4.56 3.70 - 5.32 10e6/uL   HGB 13.7 11.6 - 15.9 g/dL   HCT 40.1 34.8 - 46.6 %   MCV 88 81 - 101 fL   MCH 30.0 26.0 - 34.0 pg   MCHC 34.2 32.0 - 36.0 g/dL   RDW 13.9 11.1 - 15.7 %   Platelets 107 Platelet count consistent in citrate (L) 145 - 400 10e3/uL   NEUT# 4.1 1.5 - 6.5 10e3/uL   LYMPH# 1.7 0.9 - 3.3 10e3/uL   MONO# 0.3 0.1 - 0.9 10e3/uL   Eosinophils Absolute 0.1 0.0 - 0.5  10e3/uL   BASO# 0.0 0.0 - 0.2 10e3/uL   NEUT% 66.3 39.6 - 80.0 %   LYMPH% 26.9 14.0 - 48.0 %   MONO% 5.4 0.0 - 13.0 %   EOS% 1.1 0.0 - 7.0 %   BASO% 0.3 0.0 - 2.0 %  [facility] - Smear     Status: None   Collection Time: 10/08/14 11:54 AM  Result Value Ref Range   Smear Result Smear Available       Constitutional:  BP 105/65 mmHg  Pulse 72  Ht 5\' 4"  (1.626 m)  Wt 193 lb 9.6 oz (87.816 kg)  BMI 33.21 kg/m2   Mental Status Examination;  Patient is casually dressed and fairly groomed.  She is using wheelchair because she has unsteady gait because of pain and tinnitus.  She is cooperative.  She maintained fair eye contact.  She described her mood sad depressed and anxious.  Her affect is constricted.  She denies any auditory or visual hallucination.  She denies any active or passive suicidal thoughts or homicidal thought.  Her attention concentration is fair.  Her thought process is  slow but logical and goal-directed.  There were no paranoia, delusion or any obsessive thoughts.  Her psychomotor activity is slow.  Her fund of knowledge is average.  There were no tremors or any shakes.  There were no flight of ideas or any loose association.  Her cognition is intact.  She is alert and oriented 3.  Her insight judgment and impulse control is okay.   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 Insomnia     takes Trazodone nightly as needed and Ambien nightly   . Tinnitus     takes HCTZ daily to decrease pressure in ears  . Hearing loss     but doesn't have hearing aids  . Hypertension     hasn't been on meds for the past 51yrs   . History of bronchitis     many many yrs ago  . Dizziness     rarely  . Arthritis   . Joint pain   . Joint swelling   . GERD (gastroesophageal reflux disease)     takes Omeprazole every other day  . Constipation     will occasionally take Milk of Mag  . History of colon polyps   . History of colitis   . Diverticulosis   . Anemia     as a child  . Cataracts, bilateral     immature  . Glaucoma     borderline and no drops required  . Anxiety     takes Xanax daily  . Complication of anesthesia   . PONV (postoperative nausea and vomiting)   . 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 Chronic insomnia 10/11/2014    Axis IV: moderate   Plan:  I review her symptoms, collateral information, current medication and her recent blood work.  Patient has a lot of  symptoms of anxiety and depression and insomnia.  She wants to come off from  Xanax and Ambien however she is very reluctant to try any new medication without any side effects.  I had a long discussion with the medication about the side effects and efficacy.  I recommended to discontinue Ambien and Xanax and tried Klonopin 1 mg half tablet at bedtime and repeat half tablet if needed.  I will start Remeron 15 mg at bedtime to help insomnia, anxiety and depression.  I had a long discussion with the patient about education side effects and benefits.  I discuss metabolic side effects of Remeron.  Recommended to call us back if she has any question or any concern.  I will consider counseling on her next appointment to help coping and social skills.  Follow-up in 2-3 weeks. Time spent 55 minutes.  More than 50% of the time spent in psychoeducation, counseling and coordination of care.  Discuss safety plan that anytime having active suicidal thoughts or homicidal thoughts then patient need to call 911 or go to the local emergency room.   Kaesen Rodriguez T., MD 11/05/2014

## 2014-11-06 ENCOUNTER — Telehealth (HOSPITAL_COMMUNITY): Payer: Self-pay | Admitting: *Deleted

## 2014-11-06 NOTE — Telephone Encounter (Signed)
Patient left VM: Saw Dr. Adele Schilder yesterday 11/05/14.He changed her Xanax and Ambien to Klonopin and Remeron. Took new medications last night for first time. Has some questions about how she is feeling.  Phoned pt at 1451: left message to named VM - Calling her back. Will try again later today or in the am.

## 2014-11-07 ENCOUNTER — Telehealth (HOSPITAL_COMMUNITY): Payer: Self-pay | Admitting: Psychiatry

## 2014-11-07 ENCOUNTER — Telehealth (HOSPITAL_COMMUNITY): Payer: Self-pay

## 2014-11-07 NOTE — Telephone Encounter (Signed)
I returned patient's phone call.  She took Remeron but felt tinnitus and drinking in the year.  She felt groggy next day.  She called the pharmacy and she was told to stop the Remeron.  She is taking Ambien and Xanax.  I recommended to try Klonopin and Ambien which was given on her last appointment.  Patient is also waiting for her saliva test which will be available in 30 days.  She will try Klonopin and will take Ambien if she cannot sleep.  Patient will call us back about the response.

## 2014-11-07 NOTE — Telephone Encounter (Signed)
Had left message with patient 11/11 @ 1452 that call would be returned 11/12 AM. Patient states she did not receive this message Contacted patient 11/12 @ 1015--Patient stated took Klonopin and Remeron as ordered starting on 11/10 - day of appt. Slept poorly with wild dreams all night.Woke with extreme tinnitus, ears rang all day and was feeling drugged and tired. She contacted pharmacist to ask about med side effects and if she could restart her usual Xanax during day and Ambien at night. Pharmacist advised this would be okay. She took half a Xanax at 4:30 pm [on 11/11] and then took her Ambien at usual time. She was able to sleep last night and has less ringing in ears today.  Wanted Dr. Adele Schilder to know what happened. She would like to talk to him about her meds. She is restarting her previous medications until she hears from him.

## 2014-11-07 NOTE — Telephone Encounter (Signed)
Contacted patient 11/12 @ 1015--Patient stated took Klonopin and Remeron as ordered starting on 11/10 - day of appt. Slept poorly with wild dreams all night.Woke with extreme tinnitus, ears rang all day and was feeling drugged and tired. She contacted pharmacist to ask about med side effects and if she could restart her usual Xanax during day and Ambien at night. Pharmacist advised this would be okay. She took half a Xanax at 4:30 pm [on 11/11] and then took her Ambien at usual time. She was able to sleep last night and has less ringing in ears today.   Wanted Dr. Adele Schilder to know what happened. She would like to talk to him about her meds. She is restarting her previous medications until she hears from him.

## 2014-11-13 ENCOUNTER — Telehealth: Payer: Self-pay | Admitting: Hematology & Oncology

## 2014-11-13 NOTE — Telephone Encounter (Signed)
Pt left message had to have appointment after 2 pm. I left her message moved 12-9 to 12-23 at 2:30 pm

## 2014-11-19 ENCOUNTER — Encounter (HOSPITAL_COMMUNITY): Payer: Self-pay | Admitting: Psychiatry

## 2014-11-19 ENCOUNTER — Ambulatory Visit (INDEPENDENT_AMBULATORY_CARE_PROVIDER_SITE_OTHER): Payer: BC Managed Care – PPO | Admitting: Psychiatry

## 2014-11-19 VITALS — BP 127/71 | HR 67 | Ht 64.0 in | Wt 198.8 lb

## 2014-11-19 DIAGNOSIS — F411 Generalized anxiety disorder: Secondary | ICD-10-CM

## 2014-11-19 DIAGNOSIS — G4700 Insomnia, unspecified: Secondary | ICD-10-CM

## 2014-11-19 DIAGNOSIS — F329 Major depressive disorder, single episode, unspecified: Secondary | ICD-10-CM

## 2014-11-19 MED ORDER — ESCITALOPRAM OXALATE 10 MG PO TABS: ORAL_TABLET | ORAL | Status: DC

## 2014-11-19 NOTE — Progress Notes (Signed)
[facility] [phone] Progress Note   Gabrielle [name] [id number] 63 y.o.  11/19/2014 4:19 PM  Chief Complaint: I stopped taking Remeron and Klonopin.  I was having tinnitus with the medication.  I cannot sleep.  I have a lot of anxiety.  History of Present Illness:   Gabrielle [name] is 62 year old African-American divorced employed female  who was seen on November 10 as initial evaluation for the management of insomnia, anxiety and depression.  She was taking Xanax and Ambien and she wanted to come off from the medication.  She was given Remeron and Klonopin however patient developed medication reaction.  Her tinnitus get worst and she called [place] and she was told to stop Remeron but continue Klonopin.  However patient was scared and she stopped both medication.  She started again Xanax 1 mg twice a day and Ambien 25 mg at bedtime.  She wants to come off from the medication but she is very concerned and anxious because she does not want to go through withdrawals .  Despite recommendation to continue Klonopin she was afraid.  She still have crying spells, decreased social life and sometime feeling hopeless and helpless.  She has decrease self-esteem because she does not leave her house unless it is important.  She still have a lot of resentment about her previous treatment for her knee surgery and tinnitus.  She is complaining of a lot of knee pain and requires a walker when she walks.  She started part-time work trying to keep herself busy.  She wants to try a different medication.  Patient denies any paranoia, hallucination, active or passive suicidal thoughts.  She denies any aggression or violence.  She also endorsed not happy with her daughter who lives close by but got involved in her social life.  Patient denies drinking or using any illegal substances.  Suicidal Ideation: No Plan Formed: No Patient has means to carry out plan: No  Homicidal Ideation: No Plan Formed: No Patient has means to carry  out plan: No  Past Psychiatric History/Hospitalization(s) Patient denies any previous history of psychiatric inpatient treatment.  She was given Paxil by a physician assistant however she took only 2 doses and to stop.  She is taking Xanax and Ambien which is prescribed by her ENT specialist.  Patient denies any history of mania, psychosis, hallucination or any aggressive behavior.  She denies any history of PTSD, obsessive-compulsive thoughts or any panic attacks.  She denies any history of suicidal attempt. Anxiety: Yes Bipolar Disorder: No Depression: Yes Mania: No Psychosis: No Schizophrenia: No Personality Disorder: No Hospitalization for psychiatric illness: No History of Electroconvulsive Shock Therapy: No Prior Suicide Attempts: No  Medical History; Patient has tinnitus, hearing loss, hypertension, osteoarthritis, GERD, glaucoma, tonsillectomy, right total knee replacement, arthroplasty and appendectomy.  Her primary care physician is Dr. Shirlean Mylar at [facility].  Psychosocial History; Patient born and raised in [place].  She is divorced from her husband because her husband was involved in extramarital affairs.  She has 2 daughters she raised by herself.  One of her daughter lives in [place] and other daughter lives close by.  Patient lives by herself.  Review of Systems  HENT: Positive for tinnitus.   Musculoskeletal: Positive for joint pain.  Skin: Negative.   Psychiatric/Behavioral: Positive for depression. The patient is nervous/anxious and has insomnia.      Psychiatric: Agitation: No Hallucination: No Depressed Mood: Yes Insomnia: Yes Hypersomnia: No Altered Concentration: No Feels Worthless: Yes Grandiose Ideas:  No Belief In Special Powers: No New/Increased Substance Abuse: No Compulsions: No  Neurologic: Headache: No Seizure: No Paresthesias: No   Musculoskeletal: Strength & Muscle Tone: within normal limits Gait & Station: unsteady,  due to tinnitus and right knee painpatient has difficulty walking Patient leans: N/A   Outpatient Encounter Prescriptions as of 11/19/2014  Medication Sig  . cholecalciferol (VITAMIN D) 1000 UNITS tablet Take 2,000 Units by mouth daily.  . clonazePAM (KLONOPIN) 1 MG tablet Take 1/2 tab at bed time and 1/2 needed again if needed  . escitalopram (LEXAPRO) 10 MG tablet Take 1 tab for 2 weeks and than 2 tab daily  . meloxicam (MOBIC) 7.5 MG tablet Take 7.5 mg by mouth daily.  Marland Kitchen omeprazole (PRILOSEC OTC) 20 MG tablet Take 20 mg by mouth as needed.   . polyethylene glycol (MIRALAX) packet Take 17 g by mouth daily as needed.   . traMADol (ULTRAM) 50 MG tablet Take by mouth every 6 (six) hours as needed.  . [DISCONTINUED] mirtazapine (REMERON SOL-TAB) 15 MG disintegrating tablet Take 1 tablet (15 mg total) by mouth at bedtime.    Recent Results (from the past 2160 hour(s))  CBC with Differential [facility])     Status: Abnormal   Collection Time: 10/08/14 11:54 AM  Result Value Ref Range   WBC 6.1 3.9 - 10.0 10e3/uL   RBC 4.56 3.70 - 5.32 10e6/uL   HGB 13.7 11.6 - 15.9 g/dL   HCT 40.1 34.8 - 46.6 %   MCV 88 81 - 101 fL   MCH 30.0 26.0 - 34.0 pg   MCHC 34.2 32.0 - 36.0 g/dL   RDW 13.9 11.1 - 15.7 %   Platelets 107 Platelet count consistent in citrate (L) 145 - 400 10e3/uL   NEUT# 4.1 1.5 - 6.5 10e3/uL   LYMPH# 1.7 0.9 - 3.3 10e3/uL   MONO# 0.3 0.1 - 0.9 10e3/uL   Eosinophils Absolute 0.1 0.0 - 0.5 10e3/uL   BASO# 0.0 0.0 - 0.2 10e3/uL   NEUT% 66.3 39.6 - 80.0 %   LYMPH% 26.9 14.0 - 48.0 %   MONO% 5.4 0.0 - 13.0 %   EOS% 1.1 0.0 - 7.0 %   BASO% 0.3 0.0 - 2.0 %  [facility] - Smear     Status: None   Collection Time: 10/08/14 11:54 AM  Result Value Ref Range   Smear Result Smear Available       Constitutional:  BP 127/71 mmHg  Pulse 67  Ht 5\' 4"  (1.626 m)  Wt 198 lb 12.8 oz (90.175 kg)  BMI 34.11 kg/m2   Mental Status Examination;  Patient is casually dressed and  fairly groomed.  She is using wheelchair because of pain and tinnitus.  She is cooperative.  She maintained fair eye contact.  She described her mood anxious and her affect is constricted.  She denies any auditory or visual hallucination.  She denies any active or passive suicidal thoughts or homicidal thought.  Her attention concentration is fair.  Her thought process is slow but logical and goal-directed.  There were no paranoia, delusion or any obsessive thoughts.  Her psychomotor activity is slow.  Her fund of knowledge is average.  There were no tremors or any shakes.  There were no flight of ideas or any loose association.  Her cognition is intact.  She is alert and oriented 3.  Her insight judgment and impulse control is okay.   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 Insomnia     takes Trazodone nightly as needed and Ambien nightly   . Tinnitus     takes HCTZ daily to decrease pressure in ears  . Hearing loss     but doesn't have hearing aids  . Hypertension     hasn't been on meds for the past 1yrs   . History of bronchitis     many many yrs ago  . Dizziness     rarely  . Arthritis   . Joint pain   . Joint swelling   . GERD (gastroesophageal reflux disease)     takes Omeprazole every other day  . Constipation     will occasionally take Milk of Mag  . History of colon polyps   . History of colitis   . Diverticulosis   . Anemia     as a child  . Cataracts, bilateral     immature  . Glaucoma     borderline and no drops required  . Anxiety     takes  Xanax daily  . Complication of anesthesia   . PONV (postoperative nausea and vomiting)   . 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 Chronic insomnia 10/11/2014    Axis IV: moderate   Plan:  I had a long discussion with the patient about side effects and benefits of antianxiety medication.  She wants to come off from benzodiazepine.  I recommended to try Klonopin again since she felt some improvement but also developed worsening of tinnitus which could be due to Remeron.  I will discontinue Remeron.  Recommended to take Klonopin half tablet and if she cannot sleep and she can take another tablet.  Discontinue Xanax and Ambien.  We will try Lexapro 5 mg however if she does not have any side effects she can take 10 mg) 10 days.  I also believe she should see a therapist for coping and social skills.  We will schedule appointment with Tharon Aquas in this office.  I recommended to call us back if she has any question or any concern.  I will see her again in 3 weeks.  Time spent 25 minutes.  More than 50% of the time spent in psychoeducation, counseling and coordination of care.  Discuss safety plan that anytime having active suicidal thoughts or homicidal thoughts then patient need to call 911 or go to the local emergency room.   Samiyyah Moffa T., MD 11/19/2014

## 2014-11-29 ENCOUNTER — Other Ambulatory Visit (HOSPITAL_COMMUNITY): Payer: Self-pay | Admitting: Psychiatry

## 2014-12-02 ENCOUNTER — Other Ambulatory Visit (HOSPITAL_COMMUNITY): Payer: Self-pay | Admitting: Psychiatry

## 2014-12-02 NOTE — Telephone Encounter (Signed)
It was discontinued on last visit.

## 2014-12-03 ENCOUNTER — Other Ambulatory Visit: Payer: Self-pay | Admitting: Nurse Practitioner

## 2014-12-03 DIAGNOSIS — D696 Thrombocytopenia, unspecified: Secondary | ICD-10-CM

## 2014-12-04 ENCOUNTER — Other Ambulatory Visit: Payer: BC Managed Care – PPO | Admitting: Lab

## 2014-12-04 ENCOUNTER — Ambulatory Visit (HOSPITAL_BASED_OUTPATIENT_CLINIC_OR_DEPARTMENT_OTHER): Payer: BC Managed Care – PPO | Admitting: Hematology & Oncology

## 2014-12-04 ENCOUNTER — Other Ambulatory Visit (HOSPITAL_BASED_OUTPATIENT_CLINIC_OR_DEPARTMENT_OTHER): Payer: BC Managed Care – PPO | Admitting: Lab

## 2014-12-04 ENCOUNTER — Encounter: Payer: Self-pay | Admitting: Hematology & Oncology

## 2014-12-04 ENCOUNTER — Ambulatory Visit: Payer: BC Managed Care – PPO | Admitting: Hematology & Oncology

## 2014-12-04 ENCOUNTER — Telehealth: Payer: Self-pay | Admitting: Hematology & Oncology

## 2014-12-04 VITALS — BP 140/67 | HR 74 | Temp 98.0°F | Resp 14 | Ht 64.0 in | Wt 201.0 lb

## 2014-12-04 DIAGNOSIS — D696 Thrombocytopenia, unspecified: Secondary | ICD-10-CM

## 2014-12-04 LAB — CBC WITH DIFFERENTIAL (CANCER CENTER ONLY)
BASO#: 0 10*3/uL (ref 0.0–0.2)
BASO%: 0.4 % (ref 0.0–2.0)
EOS%: 1.4 % (ref 0.0–7.0)
Eosinophils Absolute: 0.1 10*3/uL (ref 0.0–0.5)
HCT: 38.8 % (ref 34.8–46.6)
HGB: 12.8 g/dL (ref 11.6–15.9)
LYMPH#: 1.6 10*3/uL (ref 0.9–3.3)
LYMPH%: 30.8 % (ref 14.0–48.0)
MCH: 29.6 pg (ref 26.0–34.0)
MCHC: 33 g/dL (ref 32.0–36.0)
MCV: 90 fL (ref 81–101)
MONO#: 0.3 10*3/uL (ref 0.1–0.9)
MONO%: 5.9 % (ref 0.0–13.0)
NEUT#: 3.1 10*3/uL (ref 1.5–6.5)
NEUT%: 61.5 % (ref 39.6–80.0)
Platelets: 108 10*3/uL — ABNORMAL LOW (ref 145–400)
RBC: 4.32 10*6/uL (ref 3.70–5.32)
RDW: 13.4 % (ref 11.1–15.7)
WBC: 5.1 10*3/uL (ref 3.9–10.0)

## 2014-12-04 LAB — CHCC SATELLITE - SMEAR

## 2014-12-04 NOTE — Progress Notes (Signed)
Hematology and Oncology Follow Up Visit  Gabrielle [name] [id number] 1951-12-11 63 y.o. 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 We saw her back in October. At that point in time, all of her studies came out good. There is no obvious reason for the thrombocytopenia. As such, I thought that she probably has mild immune-based disease.  Her big problem is the tinnitus. This is really cause her a lot of problems. She may see an acupuncturist for this.  She's had no bruising. She's had no bleeding. She's had no change in bowel or bladder habits. She's had no nausea or vomiting. There's been no rashes on her legs. She's had no joint issues.  Overall, her performance status is ECOG 0.    Medications: Current outpatient prescriptions: ALPRAZolam (XANAX) 1 MG tablet, Take 1 mg by mouth 2 (two) times daily., Disp: , Rfl: ;  cholecalciferol (VITAMIN D) 1000 UNITS tablet, Take 2,000 Units by mouth daily., Disp: , Rfl: ;  omeprazole (PRILOSEC OTC) 20 MG tablet, Take 20 mg by mouth as needed. , Disp: , Rfl: ;  polyethylene glycol (MIRALAX) packet, Take 17 g by mouth daily as needed. , Disp: , Rfl:  traMADol (ULTRAM) 50 MG tablet, Take by mouth every 6 (six) hours as needed., Disp: , Rfl: ;  zolpidem (AMBIEN) 10 MG tablet, Take 10 mg by mouth at bedtime as needed for sleep. May take 1 and 1/2 tab = 15 mg, Disp: , Rfl: ;  clonazePAM (KLONOPIN) 1 MG tablet, Take 1/2 tab at bed time and 1/2 needed again if needed (Patient not taking: Reported on 12/04/2014), Disp: 30 tablet, Rfl: 0 escitalopram (LEXAPRO) 10 MG tablet, Take 1 tab for 2 weeks and than 2 tab daily (Patient not taking: Reported on 12/04/2014), Disp: 60 tablet, Rfl: 0;  meloxicam (MOBIC) 7.5 MG tablet, Take 7.5 mg by mouth daily., Disp: , Rfl:   Allergies:  Allergies  Allergen Reactions  . Contrast Media  [Iodinated Diagnostic Agents] Hives  . Hydrocodone Other (See Comments)    insomnia    Past Medical History, Surgical history, Social history, and Family History were reviewed and updated.  Review of Systems: As above  Physical Exam:  height is 5' 4" (1.626 m) and weight is 201 lb (91.173 kg). Her oral temperature is 98 F (36.7 C). Her blood pressure is 140/67 and her pulse is 74. Her respiration is 14.   Well-developed and well-nourished African-American female. Her head and neck exam shows no ocular or oral lesions. She has no palpable cervical or supraclavicular lymph nodes. Lungs are clear. Cardiac exam regular rate and rhythm with a normal S1 and S2. There are no murmurs, rubs or bruits. Abdomen is soft. She has good bowel sounds. There is no fluid wave. There is no palpable liver or spleen tip. Extremities shows no clubbing, cyanosis or edema. She has surgical scar on the left knee from past surgery. Skin exam shows no rashes, ecchymoses or petechia. Neurological exam is nonfocal.  Lab Results  Component Value Date   WBC 5.1 12/04/2014   HGB 12.8 12/04/2014   HCT 38.8 12/04/2014   MCV 90 12/04/2014   PLT 108 Platelet count consistent in citrate* 12/04/2014     Chemistry      Component Value Date/Time   NA 141 03/30/2014 1305   K 3.6* 03/30/2014  1305   CL 104 03/30/2014 1305   CO2 22 03/30/2014 1305   BUN 5* 03/30/2014 1305   CREATININE 0.75 03/30/2014 1305      Component Value Date/Time   CALCIUM 8.8 03/30/2014 1305   ALKPHOS 110 03/30/2014 1305   AST 21 03/30/2014 1305   ALT 12 03/30/2014 1305   BILITOT 0.7 03/30/2014 1305         Impression and Plan: Ms. Kommer is a 63 year old Afro-American female. She has mild thrombocytopenia. She is asymptomatic. Her platelet count has not changed since we first saw her back in October.  Her blood smear is pretty much bland. I didn't do not see anything on the blood smear that would be suggestive of a bone marrow  disorder.  I think we can get her back in 4 months now. I do not see need for a bone marrow biopsy.   [name], MD 12/9/201511:00 AM

## 2014-12-04 NOTE — Telephone Encounter (Signed)
Mailed 4-6 schedule

## 2014-12-09 ENCOUNTER — Telehealth: Payer: Self-pay | Admitting: *Deleted

## 2014-12-09 NOTE — Telephone Encounter (Signed)
Patient called and asked if there was a chance that her tinnitus was related to her low platelet count. I spoke with Dr Marin Olp who doubted any connection. I relayed this to the patient. She will follow through with the suggestions given to her by Dr Marin Olp at her last visit.

## 2014-12-10 ENCOUNTER — Ambulatory Visit (HOSPITAL_COMMUNITY): Payer: Self-pay | Admitting: Psychiatry

## 2014-12-11 ENCOUNTER — Other Ambulatory Visit: Payer: BC Managed Care – PPO | Admitting: Lab

## 2014-12-11 ENCOUNTER — Ambulatory Visit: Payer: BC Managed Care – PPO | Admitting: Hematology & Oncology

## 2014-12-18 ENCOUNTER — Other Ambulatory Visit: Payer: BC Managed Care – PPO | Admitting: Lab

## 2014-12-18 ENCOUNTER — Ambulatory Visit: Payer: BC Managed Care – PPO | Admitting: Hematology & Oncology

## 2014-12-25 ENCOUNTER — Ambulatory Visit (HOSPITAL_COMMUNITY): Payer: Self-pay | Admitting: Clinical

## 2015-01-03 ENCOUNTER — Telehealth (HOSPITAL_COMMUNITY): Payer: Self-pay | Admitting: *Deleted

## 2015-01-03 NOTE — Telephone Encounter (Signed)
Pt called stating her medications were changed around and she needs to speak with Dr. Adele Schilder about them. She was supposed to stop taking Ambien and Xanax and take Lexapro. She has never started the Lexapro because she was afraid too after the reaction she had with Remeron. Was still taking Ambien and Xanax. States she had to sleep. Asking if Lorrin Mais and Bobbye Charleston will work together until she gets something else and if so how much should she take. Does not want to start the Lexapro, feels she does not need antidepressant just something to help her sleep.

## 2015-01-06 ENCOUNTER — Other Ambulatory Visit (HOSPITAL_COMMUNITY): Payer: Self-pay | Admitting: Psychiatry

## 2015-01-06 ENCOUNTER — Telehealth (HOSPITAL_COMMUNITY): Payer: Self-pay | Admitting: Psychiatry

## 2015-01-06 NOTE — Telephone Encounter (Signed)
I returned patient's phone call.  I explained to start Lexapro should which she has not started yet.  Recommended to start Lexapro 5 mg daily and then gradually increase to 10 mg if she is worried about the side effects.  Recommended not to take standing sent Ambien together.  Continue Lexapro and Klonopin and if she cannot sleep then she can take half Ambien.  Patient is scheduled to see on 18th.  Recommended to call us back if she has any further question.

## 2015-01-10 ENCOUNTER — Ambulatory Visit (HOSPITAL_COMMUNITY): Payer: Self-pay | Admitting: Clinical

## 2015-01-10 ENCOUNTER — Telehealth (HOSPITAL_COMMUNITY): Payer: Self-pay

## 2015-01-10 NOTE — Telephone Encounter (Signed)
Patient left a message requesting a call back from clinic nurse or Dr. Adele Schilder.  Message left on patient's voicemail to attempt call back.  Requested patient call clinic today to follow up on any concerns.  Patient's original message left no detail as to why she was requesting call back.

## 2015-01-13 ENCOUNTER — Ambulatory Visit (INDEPENDENT_AMBULATORY_CARE_PROVIDER_SITE_OTHER): Payer: BLUE CROSS/BLUE SHIELD | Admitting: Psychiatry

## 2015-01-13 ENCOUNTER — Other Ambulatory Visit (HOSPITAL_COMMUNITY): Payer: Self-pay | Admitting: Psychiatry

## 2015-01-13 ENCOUNTER — Encounter (HOSPITAL_COMMUNITY): Payer: Self-pay | Admitting: Psychiatry

## 2015-01-13 DIAGNOSIS — F329 Major depressive disorder, single episode, unspecified: Secondary | ICD-10-CM

## 2015-01-13 DIAGNOSIS — F411 Generalized anxiety disorder: Secondary | ICD-10-CM

## 2015-01-13 NOTE — Progress Notes (Signed)
[facility] [phone] Progress Note   Gabrielle [name] [id number] 64 y.o.  01/13/2015 4:43 PM  Chief Complaint:  I have not start taking Lexapro.  I'm afraid to start the medication.    History of Present Illness:  Gabrielle [name] came for her follow-up appointment.  She has not started Lexapro because she is concerned about tinnitus.  She continues to take Ambien and now she wants to try Klonopin for insomnia.  She is not interested in counseling.  She believe most of her depression and anxiety is coming from tinnitus.  She endorse is still crying spells, decreased social life and sometime feeling hopeless and helpless.  We had tried Remeron in the past but she mentioned worsening of tinnitus.  Patient told she wants somebody to be around so she can take the Lexapro just in case if her tinnitus get worse.  She also brought her saliva test which shows that Remeron is appropriate to try however it has been unhelpful.  Patient denies drinking or using any illegal substances.  Patient continued to endorse that she's not happy with her daughter who live close by but does not involve in her social life.  Her appetite is okay.    Suicidal Ideation: No Plan Formed: No Patient has means to carry out plan: No  Homicidal Ideation: No Plan Formed: No Patient has means to carry out plan: No  Past Psychiatric History/Hospitalization(s) Patient denies any previous history of psychiatric inpatient treatment.  She was given Paxil by a physician assistant however she took only 2 doses and to stop.  She is taking Xanax and Ambien which is prescribed by her ENT specialist.  Patient denies any history of mania, psychosis, hallucination or any aggressive behavior.  She denies any history of PTSD, obsessive-compulsive thoughts or any panic attacks.  She denies any history of suicidal attempt. Anxiety: Yes Bipolar Disorder: No Depression: Yes Mania: No Psychosis: No Schizophrenia: No Personality Disorder:  No Hospitalization for psychiatric illness: No History of Electroconvulsive Shock Therapy: No Prior Suicide Attempts: No  Medical History; Patient has tinnitus, hearing loss, hypertension, osteoarthritis, GERD, glaucoma, tonsillectomy, right total knee replacement, arthroplasty and appendectomy.  Her primary care physician is Dr. Shirlean Mylar at [facility].  Psychosocial History; Patient born and raised in [place].  She is divorced from her husband because her husband was involved in extramarital affairs.  She has 2 daughters she raised by herself.  One of her daughter lives in [place] and other daughter lives close by.  Patient lives by herself.  ROS Psychiatric: Agitation: No Hallucination: No Depressed Mood: Yes Insomnia: Yes Hypersomnia: No Altered Concentration: No Feels Worthless: Yes Grandiose Ideas: No Belief In Special Powers: No New/Increased Substance Abuse: No Compulsions: No  Neurologic: Headache: No Seizure: No Paresthesias: No   Musculoskeletal: Strength & Muscle Tone: within normal limits Gait & Station: unsteady, due to tinnitus and right knee painpatient has difficulty walking Patient leans: N/A   Outpatient Encounter Prescriptions as of 01/13/2015  Medication Sig  . ALPRAZolam (XANAX) 1 MG tablet Take 1 mg by mouth 2 (two) times daily.  . cholecalciferol (VITAMIN D) 1000 UNITS tablet Take 2,000 Units by mouth daily.  . clonazePAM (KLONOPIN) 1 MG tablet Take 1/2 tab at bed time and 1/2 needed again if needed (Patient not taking: Reported on 12/04/2014)  . escitalopram (LEXAPRO) 10 MG tablet Take 1 tab for 2 weeks and than 2 tab daily (Patient not taking: Reported on 12/04/2014)  . meloxicam (MOBIC) 7.5  MG tablet Take 7.5 mg by mouth daily.  Marland Kitchen omeprazole (PRILOSEC OTC) 20 MG tablet Take 20 mg by mouth as needed.   . polyethylene glycol (MIRALAX) packet Take 17 g by mouth daily as needed.   . traMADol (ULTRAM) 50 MG tablet Take by mouth every 6  (six) hours as needed.  . zolpidem (AMBIEN) 10 MG tablet Take 10 mg by mouth at bedtime as needed for sleep. May take 1 and 1/2 tab = 15 mg    Recent Results (from the past 2160 hour(s))  CBC with Differential [facility])     Status: Abnormal   Collection Time: 12/04/14 10:08 AM  Result Value Ref Range   WBC 5.1 3.9 - 10.0 10e3/uL   RBC 4.32 3.70 - 5.32 10e6/uL   HGB 12.8 11.6 - 15.9 g/dL   HCT 38.8 34.8 - 46.6 %   MCV 90 81 - 101 fL   MCH 29.6 26.0 - 34.0 pg   MCHC 33.0 32.0 - 36.0 g/dL   RDW 13.4 11.1 - 15.7 %   Platelets 108 Platelet count consistent in citrate (L) 145 - 400 10e3/uL   NEUT# 3.1 1.5 - 6.5 10e3/uL   LYMPH# 1.6 0.9 - 3.3 10e3/uL   MONO# 0.3 0.1 - 0.9 10e3/uL   Eosinophils Absolute 0.1 0.0 - 0.5 10e3/uL   BASO# 0.0 0.0 - 0.2 10e3/uL   NEUT% 61.5 39.6 - 80.0 %   LYMPH% 30.8 14.0 - 48.0 %   MONO% 5.9 0.0 - 13.0 %   EOS% 1.4 0.0 - 7.0 %   BASO% 0.4 0.0 - 2.0 %  [facility] - Smear     Status: None   Collection Time: 12/04/14 10:08 AM  Result Value Ref Range   Smear Result Smear Available       Constitutional:  There were no vitals taken for this visit.   Mental Status Examination;  Patient is casually dressed and fairly groomed.  She is superficially cooperative.  She maintained fair eye contact.  She described her mood anxious and her affect is constricted.  She denies any auditory or visual hallucination.  She denies any active or passive suicidal thoughts or homicidal thought.  Her attention concentration is fair.  Her thought process is slow but logical and goal-directed.  There were no paranoia, delusion or any obsessive thoughts.  Her psychomotor activity is slow.  Her fund of knowledge is average.  There were no tremors or any shakes.  There were no flight of ideas or any loose association.  Her cognition is intact.  She is alert and oriented 3.  Her insight judgment and impulse control is okay.   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 Insomnia     takes Trazodone nightly as needed and Ambien nightly   . Tinnitus     takes HCTZ daily to decrease pressure in ears  . Hearing loss     but doesn't have hearing aids  . Hypertension     hasn't been on meds for the past 85yrs   . History of bronchitis     many many yrs ago  . Dizziness     rarely  . Arthritis   . Joint pain   .  Joint swelling   . GERD (gastroesophageal reflux disease)     takes Omeprazole every other day  . Constipation     will occasionally take Milk of Mag  . History of colon polyps   . History of colitis   . Diverticulosis   . Anemia     as a child  . Cataracts, bilateral     immature  . Glaucoma     borderline and no drops required  . Anxiety     takes Xanax daily  . Complication of anesthesia   . PONV (postoperative nausea and vomiting)   . 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 Chronic insomnia 10/11/2014    Axis IV: moderate   Plan:  I reinforced to try a medication to see the response.  Recommended to take Lexapro 5 mg for at least 2-3 weeks and continue Klonopin half to one tablet at bedtime for insomnia.  Recommended not to take Xanax and Ambien.  One more time I offered counseling but patient declined.  Follow-up in 4 weeks.  Caydin Yeatts T., MD 01/13/2015

## 2015-02-03 ENCOUNTER — Ambulatory Visit (HOSPITAL_COMMUNITY): Payer: Self-pay | Admitting: Psychiatry

## 2015-03-10 ENCOUNTER — Ambulatory Visit: Payer: BC Managed Care – PPO | Admitting: Internal Medicine

## 2015-04-02 ENCOUNTER — Other Ambulatory Visit: Payer: Self-pay

## 2015-04-02 ENCOUNTER — Ambulatory Visit: Payer: Self-pay | Admitting: Family

## 2015-06-02 ENCOUNTER — Encounter: Payer: Self-pay | Admitting: Family Medicine

## 2015-06-02 ENCOUNTER — Ambulatory Visit (INDEPENDENT_AMBULATORY_CARE_PROVIDER_SITE_OTHER): Payer: BLUE CROSS/BLUE SHIELD | Admitting: Family Medicine

## 2015-06-02 VITALS — BP 125/50 | HR 67 | Ht 64.0 in | Wt 200.0 lb

## 2015-06-02 DIAGNOSIS — Z96651 Presence of right artificial knee joint: Secondary | ICD-10-CM

## 2015-06-02 DIAGNOSIS — M170 Bilateral primary osteoarthritis of knee: Secondary | ICD-10-CM | POA: Diagnosis not present

## 2015-06-02 DIAGNOSIS — R269 Unspecified abnormalities of gait and mobility: Secondary | ICD-10-CM | POA: Diagnosis not present

## 2015-06-02 DIAGNOSIS — M533 Sacrococcygeal disorders, not elsewhere classified: Secondary | ICD-10-CM | POA: Diagnosis not present

## 2015-06-02 DIAGNOSIS — G8929 Other chronic pain: Secondary | ICD-10-CM

## 2015-06-03 DIAGNOSIS — M533 Sacrococcygeal disorders, not elsewhere classified: Secondary | ICD-10-CM

## 2015-06-03 DIAGNOSIS — Z96659 Presence of unspecified artificial knee joint: Secondary | ICD-10-CM | POA: Insufficient documentation

## 2015-06-03 DIAGNOSIS — G8929 Other chronic pain: Secondary | ICD-10-CM | POA: Insufficient documentation

## 2015-06-03 DIAGNOSIS — R269 Unspecified abnormalities of gait and mobility: Secondary | ICD-10-CM | POA: Insufficient documentation

## 2015-06-03 NOTE — Assessment & Plan Note (Signed)
She's here today for discussion about both her knees but seems like her right one is the one that is giving her the most pain. I suspect, for watching her gait, it is because her right knee is reporting 75% of her weight when she walks. Her gait is extremely dysfunctional. She has some history of left SI joint dysfunction and that may be contributing as well as her truncal posture.  I spent greater than 50% of our 35 minute office visit in counseling and education about her knee issues, I reviewed her MRI images both before and after her surgery and reviewed her films with her. She seems very unhappy and dis-satisfied and it seems to be making her life pretty miserable. She does not want to return to the care of the surgeon who did her knee replacement.   I think the only thing I would recommend at this point is a very aggressive rehabilitation program to get her right knee to maximal function and to get her gait straightened out so that she's not overworking the right knee. After reviewing her MRI report of her left knee, it looks like at some point she's going to have to have an intervention there as she has severe issues similar to what she had in the right knee prior to knee replacement. She is adamantly opposed to taking any type of pain medicine. I think she would need a very focused rehabilitation program by someone who can focus on the whole picture rather than a single joint. I gave her a recommendation of somebody here in town whom I think could probably do an adequate job  She's going to check with her insurance and see if she can get more physical therapy approved and if so will let me know. I  Also wonder if she would benefit from a corticosteroidinjection into the left knee which might give her some pain relief and allow her to make some progress in her gait retraining. She wasn't initially to excited about that, at the end of our fairly long visit she had just about changed her mind, but I want  her to really be sure about this so I told her if she wants to do that she have to come back for second visit. She also might benefit from some low-dose tramadol which I briefly mentioned but she's adamantly opposed to any type of pain medication. I'll be happy to see her back when necessary.

## 2015-06-03 NOTE — Progress Notes (Signed)
Patient ID: Gabrielle [name], female   DOB: 1951/01/09, 64 y.o.   MRN: [id number]  Gabrielle [name] - 64 y.o. female MRN [id number]  Date of birth: 1951-08-07    SUBJECTIVE:     Right knee pain. Had knee replacement about 15 months ago. Prior to that intervention she had catching, locking and giving way of her knee as well as pain and stiffness. After the knee surgery she had extensive physical therapy, she reports 47 separate PT sessions. Does not feel like she ever totally regained normal use of her knee has it is quite stiff. She also has a lot of discomfort or pain with walking. She's generally unhappy about the outcome of her surgery and says she does not want to consider any other type of surgical or procedural intervention for that knee and likely would not consider anything for her left knee worried ever needed. She wants to know what options she has other than medicine for pain management which she does not want. She brings with her some medical records including an MRI report of her left knee that was done at an outside facility. She recently saw 2 different surgeons about both of her knees, 1 recommended surgical intervention on the left knee and the other one recommended against that. She's quite confused and wants an opinion from a non-surgeon which is why she is here today.  ROS:     She has pain I lateral knees, left hip pain in these areas is long-standing for months to years. Chronic history of ringing in her ears. She has lost quite a bit await that was intentional weight loss in the last couple of years previously weighing greater than 210 pounds by her report. She's noted no numbness or tingling in her legs, no specific leg weakness, no incontinence of bowel or bladder.  PERTINENT  PMH / PSH FH / / SH:  Past Medical, Surgical, Social, and Family History Reviewed & Updated in the EMR.  Pertinent findings include:  History total knee replacement, March 2015, Dr. Melton Krebs triathlon  prosthesis History of chronic tinnitus and chronic insomnia  OBJECTIVE: BP 125/50 mmHg  Pulse 67  Ht 5\' 4"  (1.626 m)  Wt 200 lb (90.719 kg)  BMI 34.31 kg/m2  Physical Exam:  Vital signs are reviewed. GEN.: Well-developed overweight female no acute distress KNEE: Right. Full flexion and extension to about 110. Well-healed midline scar. Calf is soft. Popliteal space is benign. There is no tenderness to palpation around the knee, no ecchymoses, no effusion, no erythema or warmth. The left knee has crepitus on extension, full range of motion. Medial and lateral joint line tenderness present. No effusion noted. GAIT: Very antalgic. She walks with a stooped posture at the hips, places most of her weight on her right leg during stance phase with a shortened swing phase on that side, which causes some hip rotation during the stance phase of her right leg.   IMAGING: I reviewed the images and report of her right knee MRI prior to her surgery in the report of her left knee MRI that was done fairly recently. Also looked at her knee films from October 2014. The plain films showed the prosthesis inappropriate place without any sign of lucency or abnormality I have scanned a copy of the left knee MRI to the chart the right knee MRI is available in the electronic medical record. ASSESSMENT & PLAN:  See problem based charting & AVS for pt instructions.

## 2015-06-27 ENCOUNTER — Telehealth: Payer: Self-pay | Admitting: Internal Medicine

## 2015-06-27 NOTE — Telephone Encounter (Signed)
Call patient regarding the referral for her to an physical therapy practice.

## 2015-07-02 ENCOUNTER — Telehealth: Payer: Self-pay | Admitting: Family Medicine

## 2015-07-02 NOTE — Telephone Encounter (Signed)
Gabrielle [name] called [facility] asking about a referral to PT. I saw her at [facility]. Make sure [name] knows to call me there for any future issues. I had talked w her about PT at [facility]. If [name] needs referral I would be happy to do it. Last we talked [name] was checking w her insurance to see if they covered it---did [name] find out? Referral would be for knee pain, lack of extension and associated gait abnormalities. THANKS! Dorcas Mcmurray

## 2015-07-03 ENCOUNTER — Other Ambulatory Visit: Payer: Self-pay | Admitting: *Deleted

## 2015-07-03 DIAGNOSIS — M25561 Pain in right knee: Secondary | ICD-10-CM

## 2015-07-03 DIAGNOSIS — M25562 Pain in left knee: Principal | ICD-10-CM

## 2015-07-03 NOTE — Telephone Encounter (Signed)
Faxed over the notes and referral to [facility]

## 2015-12-08 ENCOUNTER — Ambulatory Visit: Payer: Self-pay | Admitting: Internal Medicine

## 2015-12-09 ENCOUNTER — Other Ambulatory Visit: Payer: Self-pay | Admitting: Physician Assistant

## 2015-12-09 DIAGNOSIS — S0990XS Unspecified injury of head, sequela: Secondary | ICD-10-CM

## 2015-12-30 ENCOUNTER — Ambulatory Visit: Payer: Non-veteran care

## 2015-12-31 ENCOUNTER — Ambulatory Visit: Payer: No Typology Code available for payment source | Attending: Physician Assistant | Admitting: Physical Therapy

## 2015-12-31 DIAGNOSIS — R262 Difficulty in walking, not elsewhere classified: Secondary | ICD-10-CM | POA: Insufficient documentation

## 2015-12-31 DIAGNOSIS — G8929 Other chronic pain: Secondary | ICD-10-CM | POA: Diagnosis present

## 2015-12-31 DIAGNOSIS — M25662 Stiffness of left knee, not elsewhere classified: Secondary | ICD-10-CM | POA: Diagnosis present

## 2015-12-31 DIAGNOSIS — M6289 Other specified disorders of muscle: Secondary | ICD-10-CM | POA: Diagnosis present

## 2015-12-31 DIAGNOSIS — R29898 Other symptoms and signs involving the musculoskeletal system: Secondary | ICD-10-CM

## 2015-12-31 DIAGNOSIS — M25552 Pain in left hip: Secondary | ICD-10-CM | POA: Insufficient documentation

## 2015-12-31 DIAGNOSIS — M545 Low back pain, unspecified: Secondary | ICD-10-CM

## 2015-12-31 DIAGNOSIS — R269 Unspecified abnormalities of gait and mobility: Secondary | ICD-10-CM | POA: Insufficient documentation

## 2015-12-31 DIAGNOSIS — M25661 Stiffness of right knee, not elsewhere classified: Secondary | ICD-10-CM | POA: Insufficient documentation

## 2015-12-31 DIAGNOSIS — M25562 Pain in left knee: Secondary | ICD-10-CM | POA: Insufficient documentation

## 2016-01-01 NOTE — Therapy (Signed)
[place], [place], [address] Phone: [phone]   Fax:  [phone]  Physical Therapy Evaluation  Patient Details  Name: Gabrielle [name] MRN: [id number] Date of Birth: Aug 22, 1951 Referring Provider: Nori Riis  Encounter Date: 12/31/2015      PT End of Session - 01/01/16 1312    Visit Number 1   Number of Visits 12   Date for PT Re-Evaluation 02/25/16  added 2 weeks to ensure appts. available    PT Start Time 1355   PT Stop Time 1430   PT Time Calculation (min) 35 min   Activity Tolerance Patient tolerated treatment well   Behavior During Therapy WFL for tasks assessed/performed      Past Medical History  Diagnosis Date  . Insomnia     takes Trazodone nightly as needed and Ambien nightly   . Tinnitus     takes HCTZ daily to decrease pressure in ears  . Hearing loss     but doesn't have hearing aids  . Hypertension     hasn't been on meds for the past 34yrs   . History of bronchitis     many many yrs ago  . Dizziness     rarely  . Arthritis   . Joint pain   . Joint swelling   . GERD (gastroesophageal reflux disease)     takes Omeprazole every other day  . Constipation     will occasionally take Milk of Mag  . History of colon polyps   . History of colitis   . Diverticulosis   . Anemia     as a child  . Cataracts, bilateral     immature  . Glaucoma     borderline and no drops required  . Anxiety     takes Xanax daily  . Complication of anesthesia   . PONV (postoperative nausea and vomiting)   . 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 Chronic insomnia 10/11/2014    Past Surgical History  Procedure Laterality Date  . Abdominal hysterectomy    . Tonsillectomy    . Appendectomy    . Tubal ligation    . Colonoscopy    . Total knee arthroplasty Right 03/13/2014    DR MURPHY  . Total knee arthroplasty Right 03/13/2014    Procedure: TOTAL KNEE ARTHROPLASTY;  Surgeon: [name], MD;   Location: [facility];  Service: Orthopedics;  Laterality: Right;  . Tinnitus Right     There were no vitals filed for this visit.  Visit Diagnosis:  Abnormality of gait  Difficulty walking  Left knee pain  Stiffness of right knee  Stiffness of knee joint, left  Left-sided low back pain without sciatica  Hip pain, chronic, left  Weakness of both hips      Subjective Assessment - 12/31/15 1358    Subjective Patient presents as a familiar patient to this clinic. She was initially seen after her TKR in March 2015 for several months.  She reports recently completing 12 visits at another clinic, but was not satisfied with her outcome.  She returns to our clinic for a moderate complexity PT eval with continued stiffness in knee, antalgic gait, weakness in LEs.  She is having bilateral knee pain, L hip/back diff walking even short distances.   She is working part time, sedentary.    Pertinent History TKR Lt. LE 02/2014, SIJ pain, tinnitus, dizziness   Limitations Standing;Walking;House hold activities;Other (comment)  stairs, bed mobility  How long can you walk comfortably? <30 min    Diagnostic tests MRI and XR not avail. at this time.  Recently saw Dr. Marlou Sa for 2nd opinion.    Patient Stated Goals wants to be able to walk longer, do exercise and avoid surgery.    Currently in Pain? Yes   Pain Score 0-No pain  tightness    Pain Location Knee   Pain Orientation Left;Right   Pain Descriptors / Indicators Tightness   Pain Onset More than a month ago   Pain Frequency Intermittent   Aggravating Factors  walking, standing    Pain Relieving Factors cream, massage, stretch, walk   Effect of Pain on Daily Activities frustrating    Multiple Pain Sites Yes   Pain Score 2   Pain Location Buttocks   Pain Orientation Left   Pain Type Chronic pain   Pain Onset More than a month ago   Pain Frequency Intermittent   Aggravating Factors  walking and standing    Pain Relieving Factors sit, rest    Effect of Pain on Daily Activities painful for transitons, standing, limits exercise.             [facility] PT Assessment - 12/31/15 1409    Assessment   Medical Diagnosis knee pain    Referring Provider Nori Riis   Onset Date/Surgical Date 02/27/14   Prior Therapy Yes   Precautions   Precautions None   Restrictions   Weight Bearing Restrictions No   Balance Screen   Has the patient fallen in the past 6 months No   Has the patient had a decrease in activity level because of a fear of falling?  Yes  occ cannot get up sometmes in the AM   Is the patient reluctant to leave their home because of a fear of falling?  No   Home Environment   Living Environment Private residence   Living Arrangements Alone   Type of [place] Access Level entry   Prior Function   Level of Independence Independent   Vocation Part time employment   Cognition   Overall Cognitive Status Within Functional Limits for tasks assessed   Observation/Other Assessments   Focus on Therapeutic Outcomes (FOTO)  66%   Sensation   Light Touch Appears Intact   Coordination   Gross Motor Movements are Fluid and Coordinated Not tested   Single Leg Stance   Comments Pos trendelenburg L hip   Posture/Postural Control   Posture/Postural Control Postural limitations   Postural Limitations Anterior pelvic tilt;Right pelvic obliquity;Flexed trunk;Weight shift right   AROM   Right Hip Extension 10   Left Hip Extension 10   Right Knee Extension 5   Right Knee Flexion 102   Left Knee Extension 0   Left Knee Flexion 124   Strength   Right Hip Flexion 4+/5   Right Hip ABduction 3+/5   Left Hip Flexion 4/5   Left Hip ABduction 2+/5   Right Knee Flexion 4/5   Right Knee Extension 3+/5   Left Knee Flexion 4/5   Left Knee Extension 4+/5   Palpation   Patella mobility painful on L knee   SI assessment  pain with palpation to lateral SI border, into glutes, lateral hip and entire L. lateral thigh   Palpation  comment not well tolerated   Bed Mobility   Bed Mobility --  painful    Ambulation/Gait   Ambulation/Gait Yes   Ambulation/Gait Assistance 6: Modified independent (Device/Increase  time)   Ambulation Distance (Feet) 300 Feet   Gait Pattern Decreased stance time - left;Decreased weight shift to left;Left circumduction;Left hip hike   Gait Comments flexed, antalgic             PT Education - 01/01/16 1312    Education provided Yes   Education Details PT/POC, hip strength and importance for gait, posture, reminded to return to previous knee stretches   Person(s) Educated Patient   Methods Explanation   Comprehension Verbalized understanding          PT Short Term Goals - 01/01/16 1322    PT SHORT TERM GOAL #1   Title Pt will be I with initial HEP for ROM and strength   Time 3   Period Weeks   Status New   PT SHORT TERM GOAL #2   Title Pt will complete balance screen and set goal.    Time 3   Period Weeks   Status New   PT SHORT TERM GOAL #3   Title Pt will report less stiffness (15-25%)  in Rt. knee to ease sitting, transitioning.    Time 3   Period Weeks   Status New           PT Long Term Goals - 01/01/16 1325    PT LONG TERM GOAL #1   Title Pt will be I with more advanced HEP and concepts of RICE, posture and lifting.    Time 8   Period Weeks   Status New   PT LONG TERM GOAL #2   Title Pt will be able to stand/walk for 30 min and report only min pain in L hip, knees.    Time 8   Period Weeks   Status New   PT LONG TERM GOAL #3   Title Pt will be able to report improvement in AM mobility, getting out of bed with min difficulty   Time 8   Period Weeks   Status New   PT LONG TERM GOAL #4   Title Pt will be able to return to gym, pool for community fitness   Time 8   Period Weeks   Status New   PT LONG TERM GOAL #5   Title Pt will improve balance score to ______ (TBA)   Time 8   Period Weeks   Status New   Additional Long Term Goals    Additional Long Term Goals Yes   PT LONG TERM GOAL #6   Title Pt will demo strength in bilateral hips to 4/5 in order to normalize gait.    Time 8   Period Weeks   Status New               Plan - 01/01/16 1314    Clinical Impression Statement Patient presents with limitations in functional mobility due to joint stiffness and hip weakness, pain.  Her Rt. knee ROM will likely not improve much. She does have significant weakness in hip abduction, lacks full spinal extension.  She will benefit from PT to ease pain and stiffness in bilateral knees, but focus will be on gait and strength in hips and core to support knees.    Pt will benefit from skilled therapeutic intervention in order to improve on the following deficits Abnormal gait;Decreased range of motion;Difficulty walking;Increased fascial restricitons;Decreased endurance;Obesity;Pain;Decreased activity tolerance;Decreased balance;Impaired flexibility;Improper body mechanics;Decreased mobility;Decreased strength;Postural dysfunction;Increased edema   Rehab Potential Good   PT Frequency 2x / week   PT Duration 6 weeks  allow 8 weeks to ensure appts.    PT Treatment/Interventions Ultrasound;Neuromuscular re-education;Passive range of motion;Patient/family education;Gait training;Dry needling;Cryotherapy;Electrical Stimulation;Iontophoresis 4mg /ml Dexamethasone;Moist Heat;Balance training;Manual techniques;Therapeutic exercise;Therapeutic activities;Vasopneumatic Device;Functional mobility training;Taping   PT Next Visit Plan establish an HEP: hip strength and knee ROM    PT Home Exercise Plan has previous but not doign consistently.    Consulted and Agree with Plan of Care Patient          G-Codes - 01/24/2016 1531    Functional Assessment Tool Used FOTO   Functional Limitation Mobility: Walking and moving around   Mobility: Walking and Moving Around Current Status [phone]) At least 60 percent but less than 80 percent impaired,  limited or restricted   Mobility: Walking and Moving Around Goal Status [phone]) At least 40 percent but less than 60 percent impaired, limited or restricted       Problem List Patient Active Problem List   Diagnosis Date Noted  . Abnormality of gait 06/03/2015  . S/P TKR (total knee replacement) 06/03/2015  . Chronic left SI joint pain 06/03/2015  . 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 Chronic insomnia 10/11/2014  . Hypotension, unspecified 03/16/2014  . DJD (degenerative joint disease) of knee 03/13/2014    Vonzella Althaus 01/01/2016, 1:32 PM  [facility] [address] [place], [place], [address] Phone: [phone]   Fax:  [phone]  Name: JACQUIE BANDO MRN: [id number] Date of Birth: 14-Jul-1951  Raeford Razor, PT 01/01/2016 1:47 PM Phone: [phone] Fax: [phone]

## 2016-01-08 ENCOUNTER — Ambulatory Visit: Payer: No Typology Code available for payment source | Admitting: Physical Therapy

## 2016-01-08 DIAGNOSIS — M25552 Pain in left hip: Secondary | ICD-10-CM

## 2016-01-08 DIAGNOSIS — M25661 Stiffness of right knee, not elsewhere classified: Secondary | ICD-10-CM

## 2016-01-08 DIAGNOSIS — M25662 Stiffness of left knee, not elsewhere classified: Secondary | ICD-10-CM

## 2016-01-08 DIAGNOSIS — G8929 Other chronic pain: Secondary | ICD-10-CM

## 2016-01-08 DIAGNOSIS — R269 Unspecified abnormalities of gait and mobility: Secondary | ICD-10-CM | POA: Diagnosis not present

## 2016-01-08 DIAGNOSIS — M25562 Pain in left knee: Secondary | ICD-10-CM

## 2016-01-08 DIAGNOSIS — M545 Low back pain, unspecified: Secondary | ICD-10-CM

## 2016-01-08 DIAGNOSIS — R29898 Other symptoms and signs involving the musculoskeletal system: Secondary | ICD-10-CM

## 2016-01-08 NOTE — Therapy (Signed)
[place], [place], [address] Phone: [phone]   Fax:  [phone]  Physical Therapy Treatment  Patient Details  Name: Gabrielle [name] MRN: [id number] Date of Birth: February 24, 1951 Referring Provider: Nori Riis  Encounter Date: 01/08/2016      PT End of Session - 01/08/16 1726    Visit Number 2   Number of Visits 12   Date for PT Re-Evaluation 02/25/16   PT Start Time 1418   PT Stop Time 1518   PT Time Calculation (min) 60 min   Activity Tolerance Patient tolerated treatment well;Patient limited by pain   Behavior During Therapy [facility] for tasks assessed/performed      Past Medical History  Diagnosis Date  . Insomnia     takes Trazodone nightly as needed and Ambien nightly   . Tinnitus     takes HCTZ daily to decrease pressure in ears  . Hearing loss     but doesn't have hearing aids  . Hypertension     hasn't been on meds for the past 50yrs   . History of bronchitis     many many yrs ago  . Dizziness     rarely  . Arthritis   . Joint pain   . Joint swelling   . GERD (gastroesophageal reflux disease)     takes Omeprazole every other day  . Constipation     will occasionally take Milk of Mag  . History of colon polyps   . History of colitis   . Diverticulosis   . Anemia     as a child  . Cataracts, bilateral     immature  . Glaucoma     borderline and no drops required  . Anxiety     takes Xanax daily  . Complication of anesthesia   . PONV (postoperative nausea and vomiting)   . 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 Chronic insomnia 10/11/2014    Past Surgical History  Procedure Laterality Date  . Abdominal hysterectomy    . Tonsillectomy    . Appendectomy    . Tubal ligation    . Colonoscopy    . Total knee arthroplasty Right 03/13/2014    DR MURPHY  . Total knee arthroplasty Right 03/13/2014    Procedure: TOTAL KNEE ARTHROPLASTY;  Surgeon: [name], MD;  Location: [facility];   Service: Orthopedics;  Laterality: Right;  . Tinnitus Right     There were no vitals filed for this visit.  Visit Diagnosis:  Left knee pain  Stiffness of right knee  Stiffness of knee joint, left  Left-sided low back pain without sciatica  Hip pain, chronic, left  Weakness of both hips      Subjective Assessment - 01/08/16 1442    Subjective Sore vs pain hip ,    RT knee no pain , stiff  RT knee.  Lt knee pain.  8/10   Currently in Pain? No/denies   Pain Score 8    Pain Location Knee   Pain Orientation Left   Pain Descriptors / Indicators Aching;Stabbing   Pain Frequency Constant   Aggravating Factors  moving leg   Pain Relieving Factors cream ointment with instrument    Multiple Pain Sites Yes   Pain Score 6   Pain Location Buttocks   Pain Orientation Left   Pain Descriptors / Indicators Aching   Pain Type Chronic pain   Pain Frequency Intermittent   Aggravating Factors  walking standing  Pain Relieving Factors rest                         [facility] Adult PT Treatment/Exercise - 01/08/16 1425    Self-Care   Self-Care --  education knee anatomy,  exercise suggestions, pain control   Lumbar Exercises: Stretches   Single Knee to Chest Stretch 10 seconds;3 reps  AA, care taken to avoid Knee pain LT.   Knee/Hip Exercises: Seated   Heel Slides Limitations 10 towel on floor for LY, supine RT knee, added to home   Knee/Hip Exercises: Supine   Quad Sets 1 set;Both   Short Arc [employer] 1 set;10 reps   Heel Slides 1 set;Right   Moist Heat Therapy   Number Minutes Moist Heat 15 Minutes   Moist Heat Location Knee  LT,Knee buttocks                PT Education - 01/08/16 1726    Education provided Yes   Education Details anatomy, pain relieving techniques   Person(s) Educated Patient   Methods Explanation   Comprehension Verbalized understanding          PT Short Term Goals - 01/08/16 1729    PT SHORT TERM GOAL #1   Title Pt will be  I with initial HEP for ROM and strength   Time 3   Period Weeks   Status On-going   PT SHORT TERM GOAL #2   Title Pt will complete balance screen and set goal.    Time 3   Period Weeks   Status Unable to assess   PT SHORT TERM GOAL #3   Title Pt will report less stiffness (15-25%)  in Rt. knee to ease sitting, transitioning.    Time 3   Period Weeks   Status On-going           PT Long Term Goals - 01/01/16 1325    PT LONG TERM GOAL #1   Title Pt will be I with more advanced HEP and concepts of RICE, posture and lifting.    Time 8   Period Weeks   Status New   PT LONG TERM GOAL #2   Title Pt will be able to stand/walk for 30 min and report only min pain in L hip, knees.    Time 8   Period Weeks   Status New   PT LONG TERM GOAL #3   Title Pt will be able to report improvement in AM mobility, getting out of bed with min difficulty   Time 8   Period Weeks   Status New   PT LONG TERM GOAL #4   Title Pt will be able to return to gym, pool for community fitness   Time 8   Period Weeks   Status New   PT LONG TERM GOAL #5   Title Pt will improve balance score to ______ (TBA)   Time 8   Period Weeks   Status New   Additional Long Term Goals   Additional Long Term Goals Yes   PT LONG TERM GOAL #6   Title Pt will demo strength in bilateral hips to 4/5 in order to normalize gait.    Time 8   Period Weeks   Status New               Plan - 01/08/16 1727    Clinical Impression Statement progress toward home exercise goals.  Due to painful Knee LT,  Level 1 issued.     PT Next Visit Plan reviev L1 exercise, instrument saaist soft tissue work.  (patient's request)  hip strengthening, piriformis stretch.Balance screen   PT Home Exercise Plan L1 knee   Consulted and Agree with Plan of Care Patient        Problem List Patient Active Problem List   Diagnosis Date Noted  . Abnormality of gait 06/03/2015  . S/P TKR (total knee replacement) 06/03/2015  . Chronic  left SI joint pain 06/03/2015  . 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 Chronic insomnia 10/11/2014  . Hypotension, unspecified 03/16/2014  . DJD (degenerative joint disease) of knee 03/13/2014    [facility] 01/08/2016, 5:31 PM  [facility] [address] [place], [place], [address] Phone: [phone]   Fax:  [phone]  Name: Gabrielle BLACKBEAR MRN: [id number] Date of Birth: March 23, 1951    Melvenia Needles, PTA 01/08/2016 5:31 PM Phone: [phone] Fax: [phone]

## 2016-01-12 ENCOUNTER — Ambulatory Visit: Payer: No Typology Code available for payment source | Admitting: Physical Therapy

## 2016-01-15 ENCOUNTER — Ambulatory Visit: Payer: No Typology Code available for payment source | Admitting: Physical Therapy

## 2016-01-15 DIAGNOSIS — M25661 Stiffness of right knee, not elsewhere classified: Secondary | ICD-10-CM

## 2016-01-15 DIAGNOSIS — M545 Low back pain, unspecified: Secondary | ICD-10-CM

## 2016-01-15 DIAGNOSIS — R269 Unspecified abnormalities of gait and mobility: Secondary | ICD-10-CM

## 2016-01-15 DIAGNOSIS — R29898 Other symptoms and signs involving the musculoskeletal system: Secondary | ICD-10-CM

## 2016-01-15 DIAGNOSIS — M25562 Pain in left knee: Secondary | ICD-10-CM

## 2016-01-15 DIAGNOSIS — M25552 Pain in left hip: Secondary | ICD-10-CM

## 2016-01-15 DIAGNOSIS — R262 Difficulty in walking, not elsewhere classified: Secondary | ICD-10-CM

## 2016-01-15 DIAGNOSIS — G8929 Other chronic pain: Secondary | ICD-10-CM

## 2016-01-15 DIAGNOSIS — M25662 Stiffness of left knee, not elsewhere classified: Secondary | ICD-10-CM

## 2016-01-15 NOTE — Therapy (Signed)
[place], [place], [address] Phone: [phone]   Fax:  [phone]  Physical Therapy Treatment  Patient Details  Name: Gabrielle [name] MRN: [id number] Date of Birth: 1951/04/08 Referring Provider: Nori Riis  Encounter Date: 01/15/2016      PT End of Session - 01/15/16 1802    Visit Number 3   Number of Visits 12   Date for PT Re-Evaluation 02/25/16   PT Start Time [id number]   PT Stop Time 1530   PT Time Calculation (min) 72 min   Activity Tolerance Patient tolerated treatment well;Patient limited by pain   Behavior During Therapy [facility] for tasks assessed/performed      Past Medical History  Diagnosis Date  . Insomnia     takes Trazodone nightly as needed and Ambien nightly   . Tinnitus     takes HCTZ daily to decrease pressure in ears  . Hearing loss     but doesn't have hearing aids  . Hypertension     hasn't been on meds for the past 72yrs   . History of bronchitis     many many yrs ago  . Dizziness     rarely  . Arthritis   . Joint pain   . Joint swelling   . GERD (gastroesophageal reflux disease)     takes Omeprazole every other day  . Constipation     will occasionally take Milk of Mag  . History of colon polyps   . History of colitis   . Diverticulosis   . Anemia     as a child  . Cataracts, bilateral     immature  . Glaucoma     borderline and no drops required  . Anxiety     takes Xanax daily  . Complication of anesthesia   . PONV (postoperative nausea and vomiting)   . 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 Chronic insomnia 10/11/2014    Past Surgical History  Procedure Laterality Date  . Abdominal hysterectomy    . Tonsillectomy    . Appendectomy    . Tubal ligation    . Colonoscopy    . Total knee arthroplasty Right 03/13/2014    DR MURPHY  . Total knee arthroplasty Right 03/13/2014    Procedure: TOTAL KNEE ARTHROPLASTY;  Surgeon: [name], MD;  Location: [facility];   Service: Orthopedics;  Laterality: Right;  . Tinnitus Right     There were no vitals filed for this visit.  Visit Diagnosis:  Left knee pain  Stiffness of right knee  Stiffness of knee joint, left  Left-sided low back pain without sciatica  Hip pain, chronic, left  Weakness of both hips  Abnormality of gait  Difficulty walking      Subjective Assessment - 01/15/16 1755    Subjective Pain is exactly the same as last visit.  The things that make it worse and better unchanged.  Brace here.  I have not tried it yet.   Currently in Pain? Yes   Pain Score 8    Pain Location Knee   Pain Orientation Left   Pain Descriptors / Indicators Aching;Stabbing   Pain Frequency Constant   Aggravating Factors  miving leg   Pain Relieving Factors cream ointment, heat   Pain Score 6   Pain Location Buttocks   Pain Orientation Left   Pain Descriptors / Indicators Aching   Pain Frequency Intermittent   Aggravating Factors  walking standing   Pain  Relieving Factors rest                         [facility] Adult PT Treatment/Exercise - 01/15/16 1420    Self-Care   Self-Care --  brace application.Vania Rea PT decided it was wrong for Valgus   Knee/Hip Exercises: Stretches   Other Knee/Hip Stretches Standing hip adductor stretch 3 X 10   Knee/Hip Exercises: Seated   Long Arc Quad Weight 10 lbs.   Long [employer] Limitations 8" off floor to start lifting LT,   Heel Slides Right;10 reps   Hamstring Curl Left;Limitations  blue band    Sit to [employer] 10 reps   Knee/Hip Exercises: Supine   Quad Sets 1 set;10 reps   Short Arc Quad Sets 10 reps   Heel Slides 10 reps  both   Heel Slides Limitations 10 RT 112 degrees   Straight Leg Raises 10 reps   Straight Leg Raises Limitations cues RT for quad set   Knee/Hip Exercises: Sidelying   Hip ABduction AROM;Right;Left;1 set   Moist Heat Therapy   Moist Heat Location Knee;Hip  as previous   Manual Therapy   Manual therapy comments  instrument assist hip, lateral thigh, knee LT to help pain , edema.  Not sure if helpful.                 PT Education - 01/15/16 1801    Education provided Yes   Education Details L2 exercises knee from exercise drawer   Methods Explanation;Demonstration;Verbal cues;Handout   Comprehension Verbalized understanding;Returned demonstration          PT Short Term Goals - 01/15/16 1804    PT SHORT TERM GOAL #1   Title Pt will be I with initial HEP for ROM and strength   Time 3   Period Weeks   Status Achieved   PT SHORT TERM GOAL #2   Title Pt will complete balance screen and set goal.    Time 3   Period Weeks   Status On-going   PT SHORT TERM GOAL #3   Title Pt will report less stiffness (15-25%)  in Rt. knee to ease sitting, transitioning.    Time 3   Period Weeks   Status On-going           PT Long Term Goals - 01/01/16 1325    PT LONG TERM GOAL #1   Title Pt will be I with more advanced HEP and concepts of RICE, posture and lifting.    Time 8   Period Weeks   Status New   PT LONG TERM GOAL #2   Title Pt will be able to stand/walk for 30 min and report only min pain in L hip, knees.    Time 8   Period Weeks   Status New   PT LONG TERM GOAL #3   Title Pt will be able to report improvement in AM mobility, getting out of bed with min difficulty   Time 8   Period Weeks   Status New   PT LONG TERM GOAL #4   Title Pt will be able to return to gym, pool for community fitness   Time 8   Period Weeks   Status New   PT LONG TERM GOAL #5   Title Pt will improve balance score to ______ (TBA)   Time 8   Period Weeks   Status New   Additional Long Term Goals   Additional Long  Term Goals Yes   PT LONG TERM GOAL #6   Title Pt will demo strength in bilateral hips to 4/5 in order to normalize gait.    Time 8   Period Weeks   Status New               Plan - 01/15/16 1802    Clinical Impression Statement Patient here for longer session due to getting  extra help with brace.  Brace was an unloader not for Valgus.  Pain continues.  She has tried to exercise at home.  Progress toward home exercise goals.   PT Next Visit Plan review L2 exercises,  Try SI strengthening. Needs BERG   PT Home Exercise Plan L2   Consulted and Agree with Plan of Care Patient        Problem List Patient Active Problem List   Diagnosis Date Noted  . Abnormality of gait 06/03/2015  . S/P TKR (total knee replacement) 06/03/2015  . Chronic left SI joint pain 06/03/2015  . 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 Chronic insomnia 10/11/2014  . Hypotension, unspecified 03/16/2014  . DJD (degenerative joint disease) of knee 03/13/2014    [facility] 01/15/2016, 6:06 PM  [facility] [address] [place], [place], [address] Phone: [phone]   Fax:  [phone]  Name: Gabrielle [name] MRN: [id number] Date of Birth: Jun 28, 1951    Melvenia Needles, PTA 01/15/2016 6:06 PM Phone: [phone] Fax: [phone]

## 2016-01-15 NOTE — Patient Instructions (Signed)
L2 knee strengthening 10 X each 0 to 5 second holds 3 - 4 X a week.

## 2016-01-19 ENCOUNTER — Ambulatory Visit: Payer: No Typology Code available for payment source | Admitting: Physical Therapy

## 2016-01-19 DIAGNOSIS — M545 Low back pain, unspecified: Secondary | ICD-10-CM

## 2016-01-19 DIAGNOSIS — M25662 Stiffness of left knee, not elsewhere classified: Secondary | ICD-10-CM

## 2016-01-19 DIAGNOSIS — M25562 Pain in left knee: Secondary | ICD-10-CM

## 2016-01-19 DIAGNOSIS — R269 Unspecified abnormalities of gait and mobility: Secondary | ICD-10-CM

## 2016-01-19 DIAGNOSIS — R29898 Other symptoms and signs involving the musculoskeletal system: Secondary | ICD-10-CM

## 2016-01-19 DIAGNOSIS — G8929 Other chronic pain: Secondary | ICD-10-CM

## 2016-01-19 DIAGNOSIS — M25661 Stiffness of right knee, not elsewhere classified: Secondary | ICD-10-CM

## 2016-01-19 DIAGNOSIS — R262 Difficulty in walking, not elsewhere classified: Secondary | ICD-10-CM

## 2016-01-19 DIAGNOSIS — M25552 Pain in left hip: Secondary | ICD-10-CM

## 2016-01-19 NOTE — Therapy (Signed)
[place], [place], [address] Phone: [phone]   Fax:  [phone]  Physical Therapy Treatment  Patient Details  Name: Gabrielle [name] MRN: [id number] Date of Birth: Jan 15, 1951 Referring Provider: Nori Riis  Encounter Date: 01/19/2016      PT End of Session - 01/19/16 1517    Visit Number 4   Number of Visits 12   Date for PT Re-Evaluation 02/25/16   PT Start Time [id number]   PT Stop Time 1525   PT Time Calculation (min) 65 min   Activity Tolerance Patient tolerated treatment well   Behavior During Therapy [facility] for tasks assessed/performed      Past Medical History  Diagnosis Date  . Insomnia     takes Trazodone nightly as needed and Ambien nightly   . Tinnitus     takes HCTZ daily to decrease pressure in ears  . Hearing loss     but doesn't have hearing aids  . Hypertension     hasn't been on meds for the past 68yrs   . History of bronchitis     many many yrs ago  . Dizziness     rarely  . Arthritis   . Joint pain   . Joint swelling   . GERD (gastroesophageal reflux disease)     takes Omeprazole every other day  . Constipation     will occasionally take Milk of Mag  . History of colon polyps   . History of colitis   . Diverticulosis   . Anemia     as a child  . Cataracts, bilateral     immature  . Glaucoma     borderline and no drops required  . Anxiety     takes Xanax daily  . Complication of anesthesia   . PONV (postoperative nausea and vomiting)   . 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 Chronic insomnia 10/11/2014    Past Surgical History  Procedure Laterality Date  . Abdominal hysterectomy    . Tonsillectomy    . Appendectomy    . Tubal ligation    . Colonoscopy    . Total knee arthroplasty Right 03/13/2014    DR MURPHY  . Total knee arthroplasty Right 03/13/2014    Procedure: TOTAL KNEE ARTHROPLASTY;  Surgeon: [name], MD;  Location: [place];  Service: Orthopedics;   Laterality: Right;  . Tinnitus Right     There were no vitals filed for this visit.  Visit Diagnosis:  Left knee pain  Stiffness of right knee  Stiffness of knee joint, left  Left-sided low back pain without sciatica  Hip pain, chronic, left  Weakness of both hips  Abnormality of gait  Difficulty walking      Subjective Assessment - 01/19/16 1423    Subjective Had to wake early and rub my knee down.  Knees and back are tender. Was able to stand and cook for 10-15 min the other day with min to mod knee pain.    Currently in Pain? Yes   Pain Score 6    Pain Location Knee   Pain Orientation Left   Pain Descriptors / Indicators Aching   Pain Type Chronic pain   Pain Onset More than a month ago   Pain Frequency Constant   Pain Score 1   Pain Location Back   Pain Orientation Lower   Pain Descriptors / Indicators Aching   Pain Type Chronic pain   Pain Onset More than  a month ago   Pain Frequency Intermittent            [facility] PT Assessment - 01/19/16 1445    Berg Balance Test   Sit to Stand Able to stand without using hands and stabilize independently   Standing Unsupported Able to stand safely 2 minutes   Sitting with Back Unsupported but Feet Supported on Floor or Stool Able to sit safely and securely 2 minutes   Stand to Sit Sits safely with minimal use of hands   Transfers Able to transfer safely, minor use of hands   Standing Unsupported with Eyes Closed Able to stand 10 seconds safely   Standing Ubsupported with Feet Together Able to place feet together independently and stand 1 minute safely   From Standing, Reach Forward with Outstretched Arm Can reach forward >12 cm safely (5")   From Standing Position, Pick up Object from Floor Able to pick up shoe safely and easily   From Standing Position, Turn to Look Behind Over each Shoulder Looks behind from both sides and weight shifts well   Turn 360 Degrees Able to turn 360 degrees safely in 4 seconds or less    Standing Unsupported, Alternately Place Feet on Step/Stool Able to stand independently and safely and complete 8 steps in 20 seconds   Standing Unsupported, One Foot in Front Able to plae foot ahead of the other independently and hold 30 seconds   Standing on One Leg Able to lift leg independently and hold > 10 seconds   Total Score 54                     [facility] Adult PT Treatment/Exercise - 01/19/16 1434    Self-Care   Self-Care Scar Mobilizations;Other Self-Care Comments   Scar Mobilizations continue   Other Self-Care Comments  needs for muscle support and light weight training to build muscel mass, walking is not enough to strengthen   Lumbar Exercises: Aerobic   Stationary Bike NuStep 6 min level 1  UE and LE    Knee/Hip Exercises: Supine   Bridges Limitations with strap for abd and ball for add    Bridges with [employer] Strengthening;Both;1 set;10 reps  2 sets , less pain with abd and belt   Straight Leg Raises Both;1 set;10 reps   Straight Leg Raise with External Rotation Strengthening;Both;1 set;10 reps   Moist Heat Therapy   Number Minutes Moist Heat 15 Minutes   Moist Heat Location Knee  bilateral   Manual Therapy   Manual therapy comments IASTM and used hands to reduce pain, edema and tenderness in L> Rt. knee.  L knee did not feel tight.  Rt. knee with more ITB tightness.    tenderness resolves a bit with work to [employer]. knee                PT Education - 01/19/16 1517    Education provided Yes   Education Details see self care    Person(s) Educated Patient   Methods Explanation   Comprehension Verbalized understanding          PT Short Term Goals - 01/19/16 1521    PT SHORT TERM GOAL #1   Title Pt will be I with initial HEP for ROM and strength   Status Achieved   PT SHORT TERM GOAL #2   Title Pt will complete balance screen and set goal.    Status Achieved   PT SHORT TERM GOAL #3  Title Pt will report less stiffness (15-25%)  in Rt.  knee to ease sitting, transitioning.    Status On-going           PT Long Term Goals - 01/19/16 1521    PT LONG TERM GOAL #1   Title Pt will be I with more advanced HEP and concepts of RICE, posture and lifting.    Status On-going   PT LONG TERM GOAL #2   Title Pt will be able to stand/walk for 30 min and report only min pain in L hip, knees.    Status On-going   PT LONG TERM GOAL #3   Title Pt will be able to report improvement in AM mobility, getting out of bed with min difficulty   Status On-going   PT LONG TERM GOAL #4   Title Pt will be able to return to gym, pool for community fitness   Status On-going   PT LONG TERM GOAL #5   Title Pt will improve balance score to ______ (TBA)   Status Deferred               Plan - 01/19/16 1518    Clinical Impression Statement Pt reluctant to exercise but able to do a little bit.  Berg score 54/56 due to lack of confidence in reaching and tandem.  Pt reports being nervous about strengthening, fear she may cause damage.  Spent time during treatment explaining that gradual strengthening is safe and the only way to build knee stability.  Unsure if IASTM made a positive impact today.    PT Next Visit Plan review L2 exercises,  Try SI strengthening.    PT Home Exercise Plan L2 knee   Consulted and Agree with Plan of Care Patient        Problem List Patient Active Problem List   Diagnosis Date Noted  . Abnormality of gait 06/03/2015  . S/P TKR (total knee replacement) 06/03/2015  . Chronic left SI joint pain 06/03/2015  . 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 Chronic insomnia 10/11/2014  . Hypotension, unspecified 03/16/2014  . DJD (degenerative joint disease) of knee 03/13/2014    Revel Stellmach 01/19/2016, 3:25 PM  [facility] [address] [place], [place], [address] Phone: [phone]   Fax:  [phone]  Name: Gabrielle [name] MRN: [id number] Date of Birth:  01/20/1951    Raeford Razor, PT 01/19/2016 3:26 PM Phone: [phone] Fax: [phone]

## 2016-01-22 ENCOUNTER — Ambulatory Visit: Payer: No Typology Code available for payment source | Admitting: Physical Therapy

## 2016-01-22 DIAGNOSIS — R29898 Other symptoms and signs involving the musculoskeletal system: Secondary | ICD-10-CM

## 2016-01-22 DIAGNOSIS — G8929 Other chronic pain: Secondary | ICD-10-CM

## 2016-01-22 DIAGNOSIS — M25661 Stiffness of right knee, not elsewhere classified: Secondary | ICD-10-CM

## 2016-01-22 DIAGNOSIS — M25662 Stiffness of left knee, not elsewhere classified: Secondary | ICD-10-CM

## 2016-01-22 DIAGNOSIS — R269 Unspecified abnormalities of gait and mobility: Secondary | ICD-10-CM

## 2016-01-22 DIAGNOSIS — M25562 Pain in left knee: Secondary | ICD-10-CM

## 2016-01-22 DIAGNOSIS — M25552 Pain in left hip: Secondary | ICD-10-CM

## 2016-01-22 DIAGNOSIS — M545 Low back pain, unspecified: Secondary | ICD-10-CM

## 2016-01-22 DIAGNOSIS — R262 Difficulty in walking, not elsewhere classified: Secondary | ICD-10-CM

## 2016-01-22 NOTE — Therapy (Signed)
[place], [place], [address] Phone: [phone]   Fax:  [phone]  Physical Therapy Treatment  Patient Details  Name: NYILA SALTOS MRN: [id number] Date of Birth: [birth year] Referring Provider: Nori Riis  Encounter Date: 01/22/2016      PT End of Session - 01/22/16 1721    Visit Number 5   Number of Visits 12   Date for PT Re-Evaluation 02/25/16   PT Start Time [id number]   PT Stop Time 1717   PT Time Calculation (min) 42 min   Activity Tolerance Patient tolerated treatment well   Behavior During Therapy [facility] for tasks assessed/performed      Past Medical History  Diagnosis Date  . Insomnia     takes Trazodone nightly as needed and Ambien nightly   . Tinnitus     takes HCTZ daily to decrease pressure in ears  . Hearing loss     but doesn't have hearing aids  . Hypertension     hasn't been on meds for the past 71yrs   . History of bronchitis     many many yrs ago  . Dizziness     rarely  . Arthritis   . Joint pain   . Joint swelling   . GERD (gastroesophageal reflux disease)     takes Omeprazole every other day  . Constipation     will occasionally take Milk of Mag  . History of colon polyps   . History of colitis   . Diverticulosis   . Anemia     as a child  . Cataracts, bilateral     immature  . Glaucoma     borderline and no drops required  . Anxiety     takes Xanax daily  . Complication of anesthesia   . PONV (postoperative nausea and vomiting)   . 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 Chronic insomnia 10/11/2014    Past Surgical History  Procedure Laterality Date  . Abdominal hysterectomy    . Tonsillectomy    . Appendectomy    . Tubal ligation    . Colonoscopy    . Total knee arthroplasty Right 03/13/2014    DR MURPHY  . Total knee arthroplasty Right 03/13/2014    Procedure: TOTAL KNEE ARTHROPLASTY;  Surgeon: [name], MD;  Location: [facility];  Service: Orthopedics;   Laterality: Right;  . Tinnitus Right     There were no vitals filed for this visit.  Visit Diagnosis:  Left knee pain  Stiffness of right knee  Stiffness of knee joint, left  Hip pain, chronic, left  Left-sided low back pain without sciatica  Weakness of both hips  Abnormality of gait  Difficulty walking      Subjective Assessment - 01/22/16 1642    Subjective 6/10 today  Massage last visit helped me walk for the rest of the day.                           [facility] Adult PT Treatment/Exercise - 01/22/16 1645    Knee/Hip Exercises: Standing   Knee Flexion 1 set;10 reps   Hip Extension 10 reps;1 set   Forward Step Up Both;1 set;10 reps;Hand Hold: 2;Step Height: 4"   Knee/Hip Exercises: Supine   Bridges with Diona Foley Squeeze AROM;Strengthening;Both;1 set;10 reps   Bridges with [employer] Strengthening  belt 10 x,  breathing cues.   Straight Leg Raises AROM;Both;1 set;10 reps  Straight Leg Raise with External Rotation AROM;Strengthening;Both;1 set;10 reps   Straight Leg Raise with External Rotation Limitations difficult   Manual Therapy   Manual therapy comments IASTM as previous,  tissue softer and much less tender.  toady  Both.                    PT Short Term Goals - 01/19/16 1521    PT SHORT TERM GOAL #1   Title Pt will be I with initial HEP for ROM and strength   Status Achieved   PT SHORT TERM GOAL #2   Title Pt will complete balance screen and set goal.    Status Achieved   PT SHORT TERM GOAL #3   Title Pt will report less stiffness (15-25%)  in Rt. knee to ease sitting, transitioning.    Status On-going           PT Long Term Goals - 01/19/16 1521    PT LONG TERM GOAL #1   Title Pt will be I with more advanced HEP and concepts of RICE, posture and lifting.    Status On-going   PT LONG TERM GOAL #2   Title Pt will be able to stand/walk for 30 min and report only min pain in L hip, knees.    Status On-going   PT LONG TERM GOAL  #3   Title Pt will be able to report improvement in AM mobility, getting out of bed with min difficulty   Status On-going   PT LONG TERM GOAL #4   Title Pt will be able to return to gym, pool for community fitness   Status On-going   PT LONG TERM GOAL #5   Title Pt will improve balance score to ______ (TBA)   Status Deferred               Plan - 01/22/16 1722    Clinical Impression Statement Gait improving, transistions require less time,  Muscles less painful with manual.  Manual very helpful.  Patient is getting her new brace any day now.    PT Next Visit Plan review L2 exercises,  more closed chain   Consulted and Agree with Plan of Care Patient        Problem List Patient Active Problem List   Diagnosis Date Noted  . Abnormality of gait 06/03/2015  . S/P TKR (total knee replacement) 06/03/2015  . Chronic left SI joint pain 06/03/2015  . 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 Chronic insomnia 10/11/2014  . Hypotension, unspecified 03/16/2014  . DJD (degenerative joint disease) of knee 03/13/2014    [facility] 01/22/2016, 5:30 PM  [facility] [address] [place], [place], [address] Phone: [phone]   Fax:  [phone]  Name: OSHEANNA SEDILLOS MRN: [id number] Date of Birth: [birth year]    Melvenia Needles, PTA 01/22/2016 5:30 PM Phone: [phone] Fax: [phone]

## 2016-01-26 ENCOUNTER — Ambulatory Visit: Payer: No Typology Code available for payment source | Admitting: Physical Therapy

## 2016-01-29 ENCOUNTER — Ambulatory Visit: Payer: BLUE CROSS/BLUE SHIELD | Attending: Physician Assistant | Admitting: Physical Therapy

## 2016-01-29 DIAGNOSIS — M25661 Stiffness of right knee, not elsewhere classified: Secondary | ICD-10-CM | POA: Diagnosis present

## 2016-01-29 DIAGNOSIS — M545 Low back pain, unspecified: Secondary | ICD-10-CM

## 2016-01-29 DIAGNOSIS — M6289 Other specified disorders of muscle: Secondary | ICD-10-CM | POA: Diagnosis present

## 2016-01-29 DIAGNOSIS — R269 Unspecified abnormalities of gait and mobility: Secondary | ICD-10-CM | POA: Diagnosis present

## 2016-01-29 DIAGNOSIS — R262 Difficulty in walking, not elsewhere classified: Secondary | ICD-10-CM | POA: Diagnosis present

## 2016-01-29 DIAGNOSIS — M25552 Pain in left hip: Secondary | ICD-10-CM | POA: Insufficient documentation

## 2016-01-29 DIAGNOSIS — M25662 Stiffness of left knee, not elsewhere classified: Secondary | ICD-10-CM | POA: Insufficient documentation

## 2016-01-29 DIAGNOSIS — G8929 Other chronic pain: Secondary | ICD-10-CM | POA: Diagnosis present

## 2016-01-29 DIAGNOSIS — M25562 Pain in left knee: Secondary | ICD-10-CM | POA: Diagnosis present

## 2016-01-29 NOTE — Therapy (Signed)
[place], [place], [address] Phone: [phone]   Fax:  [phone]  Physical Therapy Treatment  Patient Details  Name: Gabrielle [name] MRN: [id number] Date of Birth: 02/26/1951 Referring Provider: Nori Riis  Encounter Date: 01/29/2016      PT End of Session - 01/29/16 1736    Visit Number 6   Number of Visits 12   Date for PT Re-Evaluation 02/25/16   PT Start Time [id number]   PT Stop Time 1518   PT Time Calculation (min) 60 min   Activity Tolerance Patient tolerated treatment well   Behavior During Therapy [facility] for tasks assessed/performed      Past Medical History  Diagnosis Date  . Insomnia     takes Trazodone nightly as needed and Ambien nightly   . Tinnitus     takes HCTZ daily to decrease pressure in ears  . Hearing loss     but doesn't have hearing aids  . Hypertension     hasn't been on meds for the past 41yrs   . History of bronchitis     many many yrs ago  . Dizziness     rarely  . Arthritis   . Joint pain   . Joint swelling   . GERD (gastroesophageal reflux disease)     takes Omeprazole every other day  . Constipation     will occasionally take Milk of Mag  . History of colon polyps   . History of colitis   . Diverticulosis   . Anemia     as a child  . Cataracts, bilateral     immature  . Glaucoma     borderline and no drops required  . Anxiety     takes Xanax daily  . Complication of anesthesia   . PONV (postoperative nausea and vomiting)   . 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 Chronic insomnia 10/11/2014    Past Surgical History  Procedure Laterality Date  . Abdominal hysterectomy    . Tonsillectomy    . Appendectomy    . Tubal ligation    . Colonoscopy    . Total knee arthroplasty Right 03/13/2014    DR MURPHY  . Total knee arthroplasty Right 03/13/2014    Procedure: TOTAL KNEE ARTHROPLASTY;  Surgeon: [name], MD;  Location: [facility];  Service: Orthopedics;   Laterality: Right;  . Tinnitus Right     There were no vitals filed for this visit.  Visit Diagnosis:  Left knee pain  Stiffness of right knee  Stiffness of knee joint, left  Hip pain, chronic, left  Left-sided low back pain without sciatica  Difficulty walking  Abnormality of gait      Subjective Assessment - 01/29/16 1426    Subjective (p) 8-9/10   Currently in Pain? (p) Yes   Pain Score (p) 9    Pain Location (p) Knee   Pain Orientation (p) Left;Right   Pain Descriptors / Indicators (p) Aching;Shooting   Pain Radiating Towards (p) shins   Pain Frequency (p) Constant                         [facility] Adult PT Treatment/Exercise - 01/29/16 1418    Self-Care   Self-Care --  brace info given.She has Valgus so needs unloader to address   Moist Heat Therapy   Number Minutes Moist Heat 15 Minutes   Moist Heat Location Knee  LT hip heat  too   Manual Therapy   Manual therapy comments IASTM both thighs,  knees, hamstring Medial knee.less pain reported                PT Education - 01/29/16 1735    Education provided Yes   Education Details Information for brace, unloader.  Needs something to address Valgus.  Current brace addersses Varus. For RT knee   Person(s) Educated Patient   Methods Explanation   Comprehension Verbalized understanding          PT Short Term Goals - 01/29/16 1741    PT SHORT TERM GOAL #1   Title Pt will be I with initial HEP for ROM and strength   Time 3   Period Weeks   Status Achieved   PT SHORT TERM GOAL #2   Title Pt will complete balance screen and set goal.    Time 3   Period Weeks   Status Achieved   PT SHORT TERM GOAL #3   Title Pt will report less stiffness (15-25%)  in Rt. knee to ease sitting, transitioning.    Baseline stiffness continues some days unchanged, some days less.   Time 3   Period Weeks   Status On-going           PT Long Term Goals - 01/19/16 1521    PT LONG TERM GOAL #1    Title Pt will be I with more advanced HEP and concepts of RICE, posture and lifting.    Status On-going   PT LONG TERM GOAL #2   Title Pt will be able to stand/walk for 30 min and report only min pain in L hip, knees.    Status On-going   PT LONG TERM GOAL #3   Title Pt will be able to report improvement in AM mobility, getting out of bed with min difficulty   Status On-going   PT LONG TERM GOAL #4   Title Pt will be able to return to gym, pool for community fitness   Status On-going   PT LONG TERM GOAL #5   Title Pt will improve balance score to ______ (TBA)   Status Deferred               Plan - 01/29/16 1737    Clinical Impression Statement Pain flare today.  She is hoping brace will help  gait and pain with exercise.  PT 1 X this week due to having too much pain to attend .   PT Next Visit Plan review L2 exercises,  more closed chain  if able.     PT Home Exercise Plan resume L2 as able   Consulted and Agree with Plan of Care Patient        Problem List Patient Active Problem List   Diagnosis Date Noted  . Abnormality of gait 06/03/2015  . S/P TKR (total knee replacement) 06/03/2015  . Chronic left SI joint pain 06/03/2015  . 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 Chronic insomnia 10/11/2014  . Hypotension, unspecified 03/16/2014  . DJD (degenerative joint disease) of knee 03/13/2014    [facility] 01/29/2016, 5:43 PM  [facility] [address] [place], [place], [address] Phone: [phone]   Fax:  [phone]  Name: Gabrielle [name] MRN: [id number] Date of Birth: 1951/09/06    Melvenia Needles, PTA 01/29/2016 5:43 PM Phone: [phone] Fax: [phone]

## 2016-02-03 ENCOUNTER — Ambulatory Visit: Payer: BLUE CROSS/BLUE SHIELD | Admitting: Physical Therapy

## 2016-02-10 ENCOUNTER — Ambulatory Visit: Payer: BLUE CROSS/BLUE SHIELD | Admitting: Physical Therapy

## 2016-02-10 DIAGNOSIS — R269 Unspecified abnormalities of gait and mobility: Secondary | ICD-10-CM

## 2016-02-10 DIAGNOSIS — R262 Difficulty in walking, not elsewhere classified: Secondary | ICD-10-CM

## 2016-02-10 DIAGNOSIS — M25562 Pain in left knee: Secondary | ICD-10-CM

## 2016-02-10 DIAGNOSIS — M25552 Pain in left hip: Secondary | ICD-10-CM

## 2016-02-10 DIAGNOSIS — M25662 Stiffness of left knee, not elsewhere classified: Secondary | ICD-10-CM

## 2016-02-10 DIAGNOSIS — R29898 Other symptoms and signs involving the musculoskeletal system: Secondary | ICD-10-CM

## 2016-02-10 DIAGNOSIS — M25661 Stiffness of right knee, not elsewhere classified: Secondary | ICD-10-CM

## 2016-02-10 DIAGNOSIS — M545 Low back pain, unspecified: Secondary | ICD-10-CM

## 2016-02-10 DIAGNOSIS — G8929 Other chronic pain: Secondary | ICD-10-CM

## 2016-02-10 NOTE — Therapy (Signed)
[place], [place], [address] Phone: [phone]   Fax:  [phone]  Physical Therapy Treatment  Patient Details  Name: Gabrielle [name] MRN: [id number] Date of Birth: Oct 10, 1951 Referring Provider: Nori Riis  Encounter Date: 02/10/2016      PT End of Session - 02/10/16 1428    Visit Number 7   Number of Visits 12   Date for PT Re-Evaluation 02/25/16   PT Start Time 9371   PT Stop Time 1515   PT Time Calculation (min) 53 min   Activity Tolerance Patient tolerated treatment well   Behavior During Therapy [facility] for tasks assessed/performed      Past Medical History  Diagnosis Date  . Insomnia     takes Trazodone nightly as needed and Ambien nightly   . Tinnitus     takes HCTZ daily to decrease pressure in ears  . Hearing loss     but doesn't have hearing aids  . Hypertension     hasn't been on meds for the past 48yr   . History of bronchitis     many many yrs ago  . Dizziness     rarely  . Arthritis   . Joint pain   . Joint swelling   . GERD (gastroesophageal reflux disease)     takes Omeprazole every other day  . Constipation     will occasionally take Milk of Mag  . History of colon polyps   . History of colitis   . Diverticulosis   . Anemia     as a child  . Cataracts, bilateral     immature  . Glaucoma     borderline and no drops required  . Anxiety     takes Xanax daily  . Complication of anesthesia   . PONV (postoperative nausea and vomiting)   . 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 Chronic insomnia 10/11/2014    Past Surgical History  Procedure Laterality Date  . Abdominal hysterectomy    . Tonsillectomy    . Appendectomy    . Tubal ligation    . Colonoscopy    . Total knee arthroplasty Right 03/13/2014    DR MURPHY  . Total knee arthroplasty Right 03/13/2014    Procedure: TOTAL KNEE ARTHROPLASTY;  Surgeon: DNinetta Lights MD;  [facility]  Service: Orthopedics;   Laterality: Right;  . Tinnitus Right     There were no vitals filed for this visit.  Visit Diagnosis:  Stiffness of right knee  Left knee pain  Stiffness of knee joint, left  Hip pain, chronic, left  Left-sided low back pain without sciatica  Difficulty walking  Abnormality of gait  Weakness of both hips      Subjective Assessment - 02/10/16 1425    Subjective [place] them today.  Has not worn brace at all because she does not know how to put it on.     Currently in Pain? Yes   Pain Score 4    Pain Location Knee   Pain Orientation Left   Pain Type Chronic pain   Pain Onset More than a month ago   Pain Frequency Constant   Pain Score 4   Pain Location Back   Pain Orientation Lower            [facility] PT Assessment - 02/10/16 1447    Strength  Right Hip ABduction 3+/5   Left Hip ABduction 3-/5                     [facility] Adult PT Treatment/Exercise - 02/10/16 1434    Knee/Hip Exercises: Supine   Bridges Limitations x 10    Straight Leg Raises Strengthening;Both;1 set;10 reps   Straight Leg Raise with External Rotation Strengthening;Both;1 set;10 reps   Straight Leg Raise with External Rotation Limitations difficult, encouraged abdominal bracing    Knee/Hip Exercises: Sidelying   Hip ABduction Strengthening;Both;1 set;10 reps   Moist Heat Therapy   Number Minutes Moist Heat 15 Minutes   Moist Heat Location Knee  bilat.    Manual Therapy   Manual therapy comments IASTM L knee vastus lateralis, ITB, large knotty areas distally, used manual as well to increase circulation, rolling, friction                PT Education - 02/10/16 1743    Education provided Yes   Education Details knee alignment   Person(s) Educated Patient   Methods Explanation;Demonstration   Comprehension Verbalized understanding;Returned demonstration;Need further instruction          PT  Short Term Goals - 02/10/16 1442    PT SHORT TERM GOAL #1   Title Pt will be I with initial HEP for ROM and strength   Status Achieved   PT SHORT TERM GOAL #2   Title Pt will complete balance screen and set goal.    Status Achieved   PT SHORT TERM GOAL #3   Title Pt will report less stiffness (15-25%)  in Rt. knee to ease sitting, transitioning.    Baseline stiffness continues some days unchanged, some days less.   Status Partially Met           PT Long Term Goals - 02/10/16 1437    PT LONG TERM GOAL #1   Title Pt will be I with more advanced HEP and concepts of RICE, posture and lifting.    Status On-going   PT LONG TERM GOAL #2   Title Pt will be able to stand/walk for 30 min and report only min pain in L hip, knees.    Baseline mod pain    Status On-going   PT LONG TERM GOAL #3   Title Pt will be able to report improvement in AM mobility, getting out of bed with min difficulty   Baseline improving    Status Partially Met   PT LONG TERM GOAL #4   Title Pt will be able to return to gym, pool for community fitness   Status On-going   PT LONG TERM GOAL #5   Title Pt will improve balance score to ______ (TBA)   Status Deferred   PT LONG TERM GOAL #6   Title Pt will demo strength in bilateral hips to 4/5 in order to normalize gait.    Status On-going               Plan - 02/10/16 1758    Clinical Impression Statement PT from [facility] ensures her brace is the correct one for her.  Pt will bring in the info she wrote down from her phone conversationwith him. She will not wear the brace if it causes her pain.  Low pain tolerance and cont with significant hip weakness.  Not willing to lie in prone supported to address extension strengthening.     PT Next Visit Plan Standing ther ex as tolerated.  Cont with IASTM, give ITB stretch    PT Home Exercise Plan resume L2 as able   Consulted and Agree with Plan of Care Patient        Problem List Patient Active Problem List    Diagnosis Date Noted  . Abnormality of gait 06/03/2015  . S/P TKR (total knee replacement) 06/03/2015  . Chronic left SI joint pain 06/03/2015  . 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 Chronic insomnia 10/11/2014  . Hypotension, unspecified 03/16/2014  . DJD (degenerative joint disease) of knee 03/13/2014    PAA,JENNIFER 02/10/2016, 6:03 PM  [facility] [address] [place], [place], [address] Phone: [phone]   Fax:  [phone]  Name: Gabrielle [name] MRN: [id number] Date of Birth: 03-21-1951    Raeford Razor, PT 02/10/2016 6:03 PM Phone: [phone] Fax: [phone]

## 2016-02-12 ENCOUNTER — Ambulatory Visit: Payer: BLUE CROSS/BLUE SHIELD | Admitting: Physical Therapy

## 2016-02-12 DIAGNOSIS — G8929 Other chronic pain: Secondary | ICD-10-CM

## 2016-02-12 DIAGNOSIS — M545 Low back pain, unspecified: Secondary | ICD-10-CM

## 2016-02-12 DIAGNOSIS — M25661 Stiffness of right knee, not elsewhere classified: Secondary | ICD-10-CM

## 2016-02-12 DIAGNOSIS — R269 Unspecified abnormalities of gait and mobility: Secondary | ICD-10-CM

## 2016-02-12 DIAGNOSIS — M25562 Pain in left knee: Secondary | ICD-10-CM

## 2016-02-12 DIAGNOSIS — R262 Difficulty in walking, not elsewhere classified: Secondary | ICD-10-CM

## 2016-02-12 DIAGNOSIS — M25662 Stiffness of left knee, not elsewhere classified: Secondary | ICD-10-CM

## 2016-02-12 DIAGNOSIS — M25552 Pain in left hip: Secondary | ICD-10-CM

## 2016-02-12 DIAGNOSIS — R29898 Other symptoms and signs involving the musculoskeletal system: Secondary | ICD-10-CM

## 2016-02-12 NOTE — Therapy (Signed)
[place], [place], [address] Phone: [phone]   Fax:  [phone]  Physical Therapy Treatment  Patient Details  Name: Gabrielle [name] MRN: [id number] Date of Birth: [birth year] Referring Provider: Nori Riis  Encounter Date: 02/12/2016      PT End of Session - 02/12/16 1550    Visit Number 8   Number of Visits 12   Date for PT Re-Evaluation 02/25/16   PT Start Time 1505   PT Stop Time 1605   PT Time Calculation (min) 60 min   Activity Tolerance Patient tolerated treatment well   Behavior During Therapy [facility] for tasks assessed/performed      Past Medical History  Diagnosis Date  . Insomnia     takes Trazodone nightly as needed and Ambien nightly   . Tinnitus     takes HCTZ daily to decrease pressure in ears  . Hearing loss     but doesn't have hearing aids  . Hypertension     hasn't been on meds for the past 56yr   . History of bronchitis     many many yrs ago  . Dizziness     rarely  . Arthritis   . Joint pain   . Joint swelling   . GERD (gastroesophageal reflux disease)     takes Omeprazole every other day  . Constipation     will occasionally take Milk of Mag  . History of colon polyps   . History of colitis   . Diverticulosis   . Anemia     as a child  . Cataracts, bilateral     immature  . Glaucoma     borderline and no drops required  . Anxiety     takes Xanax daily  . Complication of anesthesia   . PONV (postoperative nausea and vomiting)   . 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 Chronic insomnia 10/11/2014    Past Surgical History  Procedure Laterality Date  . Abdominal hysterectomy    . Tonsillectomy    . Appendectomy    . Tubal ligation    . Colonoscopy    . Total knee arthroplasty Right 03/13/2014    DR MURPHY  . Total knee arthroplasty Right 03/13/2014    Procedure: TOTAL KNEE ARTHROPLASTY;  Surgeon: DNinetta Lights MD;  [facility]  Service: Orthopedics;   Laterality: Right;  . Tinnitus Right     There were no vitals filed for this visit.  Visit Diagnosis:  Stiffness of right knee  Left knee pain  Stiffness of knee joint, left  Hip pain, chronic, left  Left-sided low back pain without sciatica  Difficulty walking  Abnormality of gait  Weakness of both hips      Subjective Assessment - 02/12/16 1504    Subjective Brought brace with directions today.  I need you to scrape             [facility] - 02/12/16 1542    AROM   Right Knee Extension 5   Right Knee Flexion 115   Left Knee Extension 0   Left Knee Flexion 126              PT Short Term Goals - 02/10/16 1442    PT SHORT TERM GOAL #1   Title Pt will be I with initial HEP for ROM and strength   Status Achieved   PT SHORT TERM GOAL #2   Title Pt will complete balance  screen and set goal.    Status Achieved   PT SHORT TERM GOAL #3   Title Pt will report less stiffness (15-25%)  in Rt. knee to ease sitting, transitioning.    Baseline stiffness continues some days unchanged, some days less.   Status Partially Met           PT Long Term Goals - 02/10/16 1437    PT LONG TERM GOAL #1   Title Pt will be I with more advanced HEP and concepts of RICE, posture and lifting.    Status On-going   PT LONG TERM GOAL #2   Title Pt will be able to stand/walk for 30 min and report only min pain in L hip, knees.    Baseline mod pain    Status On-going   PT LONG TERM GOAL #3   Title Pt will be able to report improvement in AM mobility, getting out of bed with min difficulty   Baseline improving    Status Partially Met   PT LONG TERM GOAL #4   Title Pt will be able to return to gym, pool for community fitness   Status On-going   PT LONG TERM GOAL #5   Title Pt will improve balance score to ______ (TBA)   Status Deferred   PT LONG TERM GOAL #6   Title Pt will demo strength in bilateral hips to 4/5 in order to normalize gait.    Status On-going                Plan - 02/12/16 1550    Clinical Impression Statement Patient requested manual today as she always has relief after this treatment.  She increased AROM after this treatment. Worked with her on donning brace for pain relief and unloading L knee. She needs a correction for valgus, which this brace did not provide. She had less knee pain when brace upside down.  [facility].  She has 4 more visits.  She understands the need to exercise to improve her mobility, hopes to get in the pool.     PT Next Visit Plan Standing ther ex as tolerated.  Cont with IASTM, give ITB stretch    PT Home Exercise Plan resume L2 as able   Consulted and Agree with Plan of Care Patient        Problem List Patient Active Problem List   Diagnosis Date Noted  . Abnormality of gait 06/03/2015  . S/P TKR (total knee replacement) 06/03/2015  . Chronic left SI joint pain 06/03/2015  . 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 Chronic insomnia 10/11/2014  . Hypotension, unspecified 03/16/2014  . DJD (degenerative joint disease) of knee 03/13/2014    Gabrielle [name] 02/12/2016, 4:01 PM  [facility] [address] [place], [place], [address] Phone: [phone]   Fax:  [phone]  Name: Gabrielle [name] MRN: [id number] Date of Birth: May 20, 1951

## 2016-02-17 ENCOUNTER — Ambulatory Visit: Payer: BLUE CROSS/BLUE SHIELD | Admitting: Physical Therapy

## 2016-02-17 DIAGNOSIS — R29898 Other symptoms and signs involving the musculoskeletal system: Secondary | ICD-10-CM

## 2016-02-17 DIAGNOSIS — M25552 Pain in left hip: Secondary | ICD-10-CM

## 2016-02-17 DIAGNOSIS — M545 Low back pain, unspecified: Secondary | ICD-10-CM

## 2016-02-17 DIAGNOSIS — M25562 Pain in left knee: Secondary | ICD-10-CM

## 2016-02-17 DIAGNOSIS — M25661 Stiffness of right knee, not elsewhere classified: Secondary | ICD-10-CM

## 2016-02-17 DIAGNOSIS — R269 Unspecified abnormalities of gait and mobility: Secondary | ICD-10-CM

## 2016-02-17 DIAGNOSIS — G8929 Other chronic pain: Secondary | ICD-10-CM

## 2016-02-17 DIAGNOSIS — R262 Difficulty in walking, not elsewhere classified: Secondary | ICD-10-CM

## 2016-02-17 DIAGNOSIS — M25662 Stiffness of left knee, not elsewhere classified: Secondary | ICD-10-CM

## 2016-02-17 NOTE — Addendum Note (Signed)
Addended by: Raeford Razor L on: 02/17/2016 02:33 PM   Modules accepted: Orders

## 2016-02-17 NOTE — Therapy (Signed)
[place], [place], [address] Phone: [phone]   Fax:  [phone]  Physical Therapy Treatment  Patient Details  Name: Gabrielle [name] MRN: [id number] Date of Birth: 05-23-1951 Referring Provider: Nori Riis  Encounter Date: 02/17/2016      PT End of Session - 02/17/16 1358    Visit Number 9   Number of Visits 12   Date for PT Re-Evaluation 02/25/16   PT Start Time 1330   PT Stop Time 1428   PT Time Calculation (min) 58 min   Activity Tolerance Patient tolerated treatment well   Behavior During Therapy [facility] for tasks assessed/performed      Past Medical History  Diagnosis Date  . Insomnia     takes Trazodone nightly as needed and Ambien nightly   . Tinnitus     takes HCTZ daily to decrease pressure in ears  . Hearing loss     but doesn't have hearing aids  . Hypertension     hasn't been on meds for the past 31yr   . History of bronchitis     many many yrs ago  . Dizziness     rarely  . Arthritis   . Joint pain   . Joint swelling   . GERD (gastroesophageal reflux disease)     takes Omeprazole every other day  . Constipation     will occasionally take Milk of Mag  . History of colon polyps   . History of colitis   . Diverticulosis   . Anemia     as a child  . Cataracts, bilateral     immature  . Glaucoma     borderline and no drops required  . Anxiety     takes Xanax daily  . Complication of anesthesia   . PONV (postoperative nausea and vomiting)   . 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 Chronic insomnia 10/11/2014    Past Surgical History  Procedure Laterality Date  . Abdominal hysterectomy    . Tonsillectomy    . Appendectomy    . Tubal ligation    . Colonoscopy    . Total knee arthroplasty Right 03/13/2014    DR MURPHY  . Total knee arthroplasty Right 03/13/2014    Procedure: TOTAL KNEE ARTHROPLASTY;  Surgeon: DNinetta Lights MD;  [facility]  Service: Orthopedics;   Laterality: Right;  . Tinnitus Right     There were no vitals filed for this visit.  Visit Diagnosis:  Stiffness of right knee  Left knee pain  Stiffness of knee joint, left  Hip pain, chronic, left  Left-sided low back pain without sciatica  Difficulty walking  Abnormality of gait  Weakness of both hips      Subjective Assessment - 02/17/16 1337    Subjective I didn't even bring my brace today.  Did a lot of walking today.  Swelling in Rt. LE this weekend and today and i'm not sure why.    Currently in Pain? Yes   Pain Score 5    Pain Location Knee   Pain Orientation Left   Pain Score 8   Pain Location Hip   Pain Orientation Left   Pain Descriptors / Indicators Aching   Pain Type Chronic pain   Pain Radiating Towards back and into L hip    Pain Onset More than a month ago   Pain Frequency Intermittent  [place] Adult PT Treatment/Exercise - 02/17/16 1342    Lumbar Exercises: Aerobic   Stationary Bike NuStep LEs only L6 for  6 min    Lumbar Exercises: Machines for Strengthening   Leg Press 1 plate x 10, 2 sets   pain throughout, but not increased    Lumbar Exercises: Standing   Other Standing Lumbar Exercises standing ITB stretch    Knee/Hip Exercises: Stretches   Active Hamstring Stretch 2 reps;30 seconds   Other Knee/Hip Stretches ITB stretch 2 x 30 sec each    Knee/Hip Exercises: Standing   SLS with Vectors hip flexion, extension and abduction x 10 each.  Pt needs encouragement to continue.  Bnt forward on elbows,on coutnertop to releive back pain    Moist Heat Therapy   Number Minutes Moist Heat 15 Minutes   Moist Heat Location Knee  ice on Rt knee due to swelling, heat on L hip   Manual Therapy   Manual therapy comments retrograde massage to Rt. knee brief, 5 min                 PT Education - 02/17/16 1417    Education provided Yes   Education Details standing hip ther ex   Person(s) Educated Patient    Methods Explanation;Demonstration   Comprehension Verbalized understanding;Returned demonstration          PT Short Term Goals - 02/10/16 1442    PT SHORT TERM GOAL #1   Title Pt will be I with initial HEP for ROM and strength   Status Achieved   PT SHORT TERM GOAL #2   Title Pt will complete balance screen and set goal.    Status Achieved   PT SHORT TERM GOAL #3   Title Pt will report less stiffness (15-25%)  in Rt. knee to ease sitting, transitioning.    Baseline stiffness continues some days unchanged, some days less.   Status Partially Met           PT Long Term Goals - 02/10/16 1437    PT LONG TERM GOAL #1   Title Pt will be I with more advanced HEP and concepts of RICE, posture and lifting.    Status On-going   PT LONG TERM GOAL #2   Title Pt will be able to stand/walk for 30 min and report only min pain in L hip, knees.    Baseline mod pain    Status On-going   PT LONG TERM GOAL #3   Title Pt will be able to report improvement in AM mobility, getting out of bed with min difficulty   Baseline improving    Status Partially Met   PT LONG TERM GOAL #4   Title Pt will be able to return to gym, pool for community fitness   Status On-going   PT LONG TERM GOAL #5   Title Pt will improve balance score to ______ (TBA)   Status Deferred   PT LONG TERM GOAL #6   Title Pt will demo strength in bilateral hips to 4/5 in order to normalize gait.    Status On-going               Plan - 02/17/16 1358    Clinical Impression Statement Pt with low tolerance for standing ex today.  Pain in L hip with standing for Rt. leg SLR.  Spoke with PT from [facility] [place] in primary care clinic who correlated XR with brace findings. She sees him on 3/15  and she will review donning procedures with him.     PT Next Visit Plan Standing ther ex as tolerated.  Cont with IASTM   PT Home Exercise Plan resume L2 as able, gave her ITB and hamstring flexibility today   Consulted and  Agree with Plan of Care Patient        Problem List Patient Active Problem List   Diagnosis Date Noted  . Abnormality of gait 06/03/2015  . S/P TKR (total knee replacement) 06/03/2015  . Chronic left SI joint pain 06/03/2015  . 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 Chronic insomnia 10/11/2014  . Hypotension, unspecified 03/16/2014  . DJD (degenerative joint disease) of knee 03/13/2014    PAA,JENNIFER 02/17/2016, 2:28 PM  [facility] [address] [place], [place], [address] Phone: [phone]   Fax:  [phone]  Name: Gabrielle [name] MRN: [id number] Date of Birth: [birth year]  Raeford Razor, PT 02/17/2016 2:28 PM Phone: [phone] Fax: [phone]

## 2016-02-17 NOTE — Patient Instructions (Signed)
Iliotibial Band Stretch    Stand with right hip _12___ inches from wall. Cross other leg in front and use it and arm for support. Lean toward wall until a stretch is felt on outside of hip near wall. Keep that leg straight. Hold _30___ seconds. Repeat _3___ times. Do __2__ sessions per day.  http://[email or web address].[email or web address]    Hamstring Stretch, Reclined (Strap, Doorframe)    Lengthen bottom leg on floor. Extend top leg along edge of doorframe or press foot up into yoga strap. THEN CROSS L LEG over midline to stretch L lateral hip  Hold for __30 sec __ breaths. Repeat __3__ times each leg. Copyright  VHI. All rights reserved.   Piriformis Stretch    Lying on back, pull right knee toward opposite shoulder. Hold __30__ seconds. Repeat __2-3__ times. Do ___2_ sessions per day.  http://[email or web address].exer.us/258   Copyright  VHI. All rights reserved.

## 2016-02-24 ENCOUNTER — Ambulatory Visit: Payer: BLUE CROSS/BLUE SHIELD | Admitting: Physical Therapy

## 2016-02-25 ENCOUNTER — Ambulatory Visit: Payer: No Typology Code available for payment source | Attending: Physician Assistant | Admitting: Physical Therapy

## 2016-02-25 DIAGNOSIS — M25552 Pain in left hip: Secondary | ICD-10-CM | POA: Diagnosis present

## 2016-02-25 DIAGNOSIS — R262 Difficulty in walking, not elsewhere classified: Secondary | ICD-10-CM | POA: Insufficient documentation

## 2016-02-25 DIAGNOSIS — R269 Unspecified abnormalities of gait and mobility: Secondary | ICD-10-CM | POA: Diagnosis present

## 2016-02-25 DIAGNOSIS — M6289 Other specified disorders of muscle: Secondary | ICD-10-CM | POA: Diagnosis present

## 2016-02-25 DIAGNOSIS — G8929 Other chronic pain: Secondary | ICD-10-CM | POA: Insufficient documentation

## 2016-02-25 DIAGNOSIS — M545 Low back pain: Secondary | ICD-10-CM | POA: Diagnosis present

## 2016-02-25 DIAGNOSIS — M25562 Pain in left knee: Secondary | ICD-10-CM | POA: Insufficient documentation

## 2016-02-25 DIAGNOSIS — M25661 Stiffness of right knee, not elsewhere classified: Secondary | ICD-10-CM | POA: Insufficient documentation

## 2016-02-25 DIAGNOSIS — M25662 Stiffness of left knee, not elsewhere classified: Secondary | ICD-10-CM | POA: Insufficient documentation

## 2016-02-25 NOTE — Therapy (Signed)
[place], [place], [address] Phone: [phone]   Fax:  [phone]  Physical Therapy Treatment  Patient Details  Name: Gabrielle [name] MRN: [id number] Date of Birth: Nov 21, 1951 Referring Provider: Nori Riis  Encounter Date: 02/25/2016      PT End of Session - 02/25/16 1627    Visit Number 10   Number of Visits 12   Date for PT Re-Evaluation 03/05/16   PT Start Time 1630   PT Stop Time 1722   PT Time Calculation (min) 52 min   Activity Tolerance Patient tolerated treatment well   Behavior During Therapy [facility] for tasks assessed/performed      Past Medical History  Diagnosis Date  . Insomnia     takes Trazodone nightly as needed and Ambien nightly   . Tinnitus     takes HCTZ daily to decrease pressure in ears  . Hearing loss     but doesn't have hearing aids  . Hypertension     hasn't been on meds for the past 36yr   . History of bronchitis     many many yrs ago  . Dizziness     rarely  . Arthritis   . Joint pain   . Joint swelling   . GERD (gastroesophageal reflux disease)     takes Omeprazole every other day  . Constipation     will occasionally take Milk of Mag  . History of colon polyps   . History of colitis   . Diverticulosis   . Anemia     as a child  . Cataracts, bilateral     immature  . Glaucoma     borderline and no drops required  . Anxiety     takes Xanax daily  . Complication of anesthesia   . PONV (postoperative nausea and vomiting)   . 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 Chronic insomnia 10/11/2014    Past Surgical History  Procedure Laterality Date  . Abdominal hysterectomy    . Tonsillectomy    . Appendectomy    . Tubal ligation    . Colonoscopy    . Total knee arthroplasty Right 03/13/2014    DR MURPHY  . Total knee arthroplasty Right 03/13/2014    Procedure: TOTAL KNEE ARTHROPLASTY;  Surgeon: DNinetta Lights MD;  [facility]  Service: Orthopedics;   Laterality: Right;  . Tinnitus Right     There were no vitals filed for this visit.  Visit Diagnosis:  Stiffness of right knee  Left knee pain  Stiffness of knee joint, left  Difficulty walking  Abnormality of gait  Hip pain, chronic, left      Subjective Assessment - 02/25/16 1726    Subjective pt reports having more pain today due to having to walk more over the weekend.    Currently in Pain? Yes   Pain Score 8    Pain Location Knee   Pain Orientation Right;Left   Aggravating Factors  prolonged walking   Pain Relieving Factors manual/ heat   Pain Score 8   Pain Orientation Right;Left   Pain Onset More than a month ago   Pain Frequency Intermittent   Aggravating Factors  prolonged walking/ standing                         [facility] Adult PT Treatment/Exercise - 02/25/16 1629    Self-Care   Other Self-Care Comments  how to perform manual  trigger point release to promote decreased muscle tension   Lumbar Exercises: Aerobic   Stationary Bike NuStep LEs only L6 for  6 min    Knee/Hip Exercises: Stretches   Active Hamstring Stretch 2 reps;30 seconds   Other Knee/Hip Stretches ITB stretch 2 x 30 sec each    Moist Heat Therapy   Number Minutes Moist Heat 10 Minutes   Moist Heat Location Knee  bil  in supine   Manual Therapy   Manual Therapy Soft tissue mobilization;Myofascial release   Soft tissue mobilization IASTM of bil distal quads   Myofascial Release manual trigger point release of bil distal quads with multiple points                PT Education - 02/25/16 1719    Education provided Yes   Education Details manual trigger point education   Person(s) Educated Patient   Methods Explanation   Comprehension Verbalized understanding          PT Short Term Goals - 02/10/16 1442    PT SHORT TERM GOAL #1   Title Pt will be I with initial HEP for ROM and strength   Status Achieved   PT SHORT TERM GOAL #2   Title Pt will complete  balance screen and set goal.    Status Achieved   PT SHORT TERM GOAL #3   Title Pt will report less stiffness (15-25%)  in Rt. knee to ease sitting, transitioning.    Baseline stiffness continues some days unchanged, some days less.   Status Partially Met           PT Long Term Goals - 02/10/16 1437    PT LONG TERM GOAL #1   Title Pt will be I with more advanced HEP and concepts of RICE, posture and lifting.    Status On-going   PT LONG TERM GOAL #2   Title Pt will be able to stand/walk for 30 min and report only min pain in L hip, knees.    Baseline mod pain    Status On-going   PT LONG TERM GOAL #3   Title Pt will be able to report improvement in AM mobility, getting out of bed with min difficulty   Baseline improving    Status Partially Met   PT LONG TERM GOAL #4   Title Pt will be able to return to gym, pool for community fitness   Status On-going   PT LONG TERM GOAL #5   Title Pt will improve balance score to ______ (TBA)   Status Deferred   PT LONG TERM GOAL #6   Title Pt will demo strength in bilateral hips to 4/5 in order to normalize gait.    Status On-going               Plan - 02/25/16 1727    Clinical Impression Statement Keita reports she is having more pain in bil knees and hips today rated at 8/10. focused today on decreasing pain and muscle tension inthe bil knees. following manual trigger point release and IASTM of bil distal quads she reported relief of pain and tightness reported at 4/10. Educated how to perform manual trigger point release with tennis ball so she could do it at home.    PT Next Visit Plan Standing ther ex as tolerated.  Cont with IASTM, manual trigger point release?   Consulted and Agree with Plan of Care Patient        Problem List Patient Active  Problem List   Diagnosis Date Noted  . Abnormality of gait 06/03/2015  . S/P TKR (total knee replacement) 06/03/2015  . Chronic left SI joint pain 06/03/2015  . Insomnia due to  substance 10/11/2014  . Chronic insomnia 10/11/2014  . Hypotension, unspecified 03/16/2014  . DJD (degenerative joint disease) of knee 03/13/2014   Starr Lake PT, DPT, LAT, ATC  02/25/2016  5:34 PM     [facility] [address] [place], [place], [address] Phone: [phone]   Fax:  [phone]  Name: DERHONDA EASTLICK MRN: [id number] Date of Birth: 12-08-1951

## 2016-02-26 ENCOUNTER — Encounter: Payer: Self-pay | Admitting: Physical Therapy

## 2016-03-02 ENCOUNTER — Ambulatory Visit: Payer: No Typology Code available for payment source | Admitting: Physical Therapy

## 2016-03-02 DIAGNOSIS — M25662 Stiffness of left knee, not elsewhere classified: Secondary | ICD-10-CM

## 2016-03-02 DIAGNOSIS — M25562 Pain in left knee: Secondary | ICD-10-CM

## 2016-03-02 DIAGNOSIS — R262 Difficulty in walking, not elsewhere classified: Secondary | ICD-10-CM

## 2016-03-02 DIAGNOSIS — G8929 Other chronic pain: Secondary | ICD-10-CM

## 2016-03-02 DIAGNOSIS — M545 Low back pain, unspecified: Secondary | ICD-10-CM

## 2016-03-02 DIAGNOSIS — M25661 Stiffness of right knee, not elsewhere classified: Secondary | ICD-10-CM | POA: Diagnosis not present

## 2016-03-02 DIAGNOSIS — R269 Unspecified abnormalities of gait and mobility: Secondary | ICD-10-CM

## 2016-03-02 DIAGNOSIS — R29898 Other symptoms and signs involving the musculoskeletal system: Secondary | ICD-10-CM

## 2016-03-02 DIAGNOSIS — M25552 Pain in left hip: Secondary | ICD-10-CM

## 2016-03-02 NOTE — Therapy (Signed)
[place], [place], [address] Phone: [phone]   Fax:  [phone]  Physical Therapy Treatment  Patient Details  Name: Gabrielle [name] MRN: [id number] Date of Birth: [birth year] Referring Provider: Nori Riis  Encounter Date: 03/02/2016      PT End of Session - 03/02/16 1423    Visit Number 11   Number of Visits 12   Date for PT Re-Evaluation 03/05/16   PT Start Time 1638   PT Stop Time 1510   PT Time Calculation (min) 48 min   Activity Tolerance Patient tolerated treatment well   Behavior During Therapy [facility] for tasks assessed/performed      Past Medical History  Diagnosis Date  . Insomnia     takes Trazodone nightly as needed and Ambien nightly   . Tinnitus     takes HCTZ daily to decrease pressure in ears  . Hearing loss     but doesn't have hearing aids  . Hypertension     hasn't been on meds for the past 20yr   . History of bronchitis     many many yrs ago  . Dizziness     rarely  . Arthritis   . Joint pain   . Joint swelling   . GERD (gastroesophageal reflux disease)     takes Omeprazole every other day  . Constipation     will occasionally take Milk of Mag  . History of colon polyps   . History of colitis   . Diverticulosis   . Anemia     as a child  . Cataracts, bilateral     immature  . Glaucoma     borderline and no drops required  . Anxiety     takes Xanax daily  . Complication of anesthesia   . PONV (postoperative nausea and vomiting)   . 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 Chronic insomnia 10/11/2014    Past Surgical History  Procedure Laterality Date  . Abdominal hysterectomy    . Tonsillectomy    . Appendectomy    . Tubal ligation    . Colonoscopy    . Total knee arthroplasty Right 03/13/2014    DR MURPHY  . Total knee arthroplasty Right 03/13/2014    Procedure: TOTAL KNEE ARTHROPLASTY;  Surgeon: DNinetta Lights MD;  [facility]  Service: Orthopedics;   Laterality: Right;  . Tinnitus Right     There were no vitals filed for this visit.  Visit Diagnosis:  Stiffness of right knee  Left knee pain  Stiffness of knee joint, left  Difficulty walking  Abnormality of gait  Hip pain, chronic, left  Left-sided low back pain without sciatica  Weakness of both hips      Subjective Assessment - 03/02/16 1424    [facility] trying to get her last appt in before next week.    Currently in Pain? Yes   Pain Score 6    Pain Location Knee   Pain Orientation Right;Left   Pain Descriptors / Indicators Sore   Pain Type Chronic pain   Pain Radiating Towards pain at night into L shin    Pain Onset More than a month ago   Pain Frequency Constant   Pain Score 6   Pain Location Back   Pain Orientation Left   Pain Descriptors / Indicators Aching   Pain Type Chronic pain             [facility] Adult  PT Treatment/Exercise - 03/02/16 1436    Lumbar Exercises: Stretches   Single Knee to Chest Stretch 3 reps;20 seconds   Knee/Hip Exercises: Stretches   Hip Flexor Stretch Left;1 rep   Hip Flexor Stretch Limitations assessed for tightness preventing full trunk ext, pain in L low lumbar    Other Knee/Hip Stretches modified piriformis stretch    Knee/Hip Exercises: Supine   Bridges Limitations x 10   post pelvic tilt x 10    Straight Leg Raise with External Rotation Strengthening;Both;1 set;10 reps   Knee/Hip Exercises: Sidelying   Hip ABduction Strengthening;Both;1 set;10 reps   Clams x 20    Moist Heat Therapy   Number Minutes Moist Heat 10 Minutes   Moist Heat Location Knee   Manual Therapy   Soft tissue mobilization L low lumbar, glutes, lateral hip   IASTM L knee anterior, quads    Myofascial Release stretch off side of  table (QL stretch position)      heat on knees was actually 5 min due to pt had to leave for appt.            PT Education - 03/02/16 1639    Education provided Yes   Education Details L  hip pain, source likely L5-S1, gait    Person(s) Educated Patient   Methods Explanation;Demonstration   Comprehension Verbalized understanding;Returned demonstration          PT Short Term Goals - 02/10/16 1442    PT SHORT TERM GOAL #1   Title Pt will be I with initial HEP for ROM and strength   Status Achieved   PT SHORT TERM GOAL #2   Title Pt will complete balance screen and set goal.    Status Achieved   PT SHORT TERM GOAL #3   Title Pt will report less stiffness (15-25%)  in Rt. knee to ease sitting, transitioning.    Baseline stiffness continues some days unchanged, some days less.   Status Partially Met           PT Long Term Goals - 02/10/16 1437    PT LONG TERM GOAL #1   Title Pt will be I with more advanced HEP and concepts of RICE, posture and lifting.    Status On-going   PT LONG TERM GOAL #2   Title Pt will be able to stand/walk for 30 min and report only min pain in L hip, knees.    Baseline mod pain    Status On-going   PT LONG TERM GOAL #3   Title Pt will be able to report improvement in AM mobility, getting out of bed with min difficulty   Baseline improving    Status Partially Met   PT LONG TERM GOAL #4   Title Pt will be able to return to gym, pool for community fitness   Status On-going   PT LONG TERM GOAL #5   Title Pt will improve balance score to ______ (TBA)   Status Deferred   PT LONG TERM GOAL #6   Title Pt will demo strength in bilateral hips to 4/5 in order to normalize gait.    Status On-going               Plan - 03/02/16 1640    Clinical Impression Statement Patient with significant pain in L hip with palpation to L5 S1 into lateral post hip.  Addressed that today, patient very anxious today, anticipating pain but not actually felling pain with simple bed mobility  for assessing back and hip. Has 1 more visit.  She is tranferring her care to [place] in [place] and will liley ask for more visits to be completed at a later date.   She has made very little progress, will assess goals formally next and last visit.     PT Next Visit Plan review full HEP, FOTO and DC. Goals    PT Home Exercise Plan resume L2 as able, gave her ITB and hamstring flexibility today   Consulted and Agree with Plan of Care Patient        Problem List Patient Active Problem List   Diagnosis Date Noted  . Abnormality of gait 06/03/2015  . S/P TKR (total knee replacement) 06/03/2015  . Chronic left SI joint pain 06/03/2015  . 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 Chronic insomnia 10/11/2014  . Hypotension, unspecified 03/16/2014  . DJD (degenerative joint disease) of knee 03/13/2014    Marguerita Stapp 03/02/2016, 4:44 PM  [facility] [address] [place], [place], [address] Phone: [phone]   Fax:  [phone]  Name: Gabrielle [name] MRN: [id number] Date of Birth: 04-26-1951    Raeford Razor, PT 03/02/2016 4:45 PM Phone: [phone] Fax: [phone]

## 2016-03-03 ENCOUNTER — Ambulatory Visit: Payer: No Typology Code available for payment source | Admitting: Physical Therapy

## 2016-03-04 ENCOUNTER — Encounter: Payer: Self-pay | Admitting: Physical Therapy

## 2016-03-04 ENCOUNTER — Ambulatory Visit: Payer: No Typology Code available for payment source | Admitting: Physical Therapy

## 2016-03-04 DIAGNOSIS — R29898 Other symptoms and signs involving the musculoskeletal system: Secondary | ICD-10-CM

## 2016-03-04 DIAGNOSIS — M545 Low back pain, unspecified: Secondary | ICD-10-CM

## 2016-03-04 DIAGNOSIS — R269 Unspecified abnormalities of gait and mobility: Secondary | ICD-10-CM

## 2016-03-04 DIAGNOSIS — R262 Difficulty in walking, not elsewhere classified: Secondary | ICD-10-CM

## 2016-03-04 DIAGNOSIS — M25552 Pain in left hip: Secondary | ICD-10-CM

## 2016-03-04 DIAGNOSIS — M25661 Stiffness of right knee, not elsewhere classified: Secondary | ICD-10-CM | POA: Diagnosis not present

## 2016-03-04 DIAGNOSIS — G8929 Other chronic pain: Secondary | ICD-10-CM

## 2016-03-04 DIAGNOSIS — M25662 Stiffness of left knee, not elsewhere classified: Secondary | ICD-10-CM

## 2016-03-04 DIAGNOSIS — M25562 Pain in left knee: Secondary | ICD-10-CM

## 2016-03-04 NOTE — Therapy (Signed)
[place], [place], [address] Phone: [phone]   Fax:  [phone]  Physical Therapy Treatment/DIscharge  Patient Details  Name: Gabrielle [name] MRN: [id number] Date of Birth: Dec 15, 1951 Referring Provider: Nori Riis  Encounter Date: 03/04/2016      PT End of Session - 03/04/16 1601    Visit Number 12   Number of Visits 12   PT Start Time 5035   PT Stop Time 1640   PT Time Calculation (min) 51 min   Activity Tolerance Patient tolerated treatment well   Behavior During Therapy [facility] for tasks assessed/performed      Past Medical History  Diagnosis Date  . Insomnia     takes Trazodone nightly as needed and Ambien nightly   . Tinnitus     takes HCTZ daily to decrease pressure in ears  . Hearing loss     but doesn't have hearing aids  . Hypertension     hasn't been on meds for the past 39yr   . History of bronchitis     many many yrs ago  . Dizziness     rarely  . Arthritis   . Joint pain   . Joint swelling   . GERD (gastroesophageal reflux disease)     takes Omeprazole every other day  . Constipation     will occasionally take Milk of Mag  . History of colon polyps   . History of colitis   . Diverticulosis   . Anemia     as a child  . Cataracts, bilateral     immature  . Glaucoma     borderline and no drops required  . Anxiety     takes Xanax daily  . Complication of anesthesia   . PONV (postoperative nausea and vomiting)   . 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 Chronic insomnia 10/11/2014    Past Surgical History  Procedure Laterality Date  . Abdominal hysterectomy    . Tonsillectomy    . Appendectomy    . Tubal ligation    . Colonoscopy    . Total knee arthroplasty Right 03/13/2014    DR MURPHY  . Total knee arthroplasty Right 03/13/2014    Procedure: TOTAL KNEE ARTHROPLASTY;  Surgeon: DNinetta Lights MD;  [facility]  Service: Orthopedics;  Laterality: Right;  .  Tinnitus Right     There were no vitals filed for this visit.  Visit Diagnosis:  Stiffness of right knee  Left knee pain  Stiffness of knee joint, left  Difficulty walking  Abnormality of gait  Hip pain, chronic, left  Left-sided low back pain without sciatica  Weakness of both hips      Subjective Assessment - 03/04/16 1633    Subjective HIp felt alot better after last time.  Was sore but I walked better.    Currently in Pain? Yes   Pain Score 4    Pain Location Knee   Pain Orientation Left            [facility] PT Assessment - 03/04/16 1623    AROM   Right Knee Flexion 115   Left Knee Extension 0   Left Knee Flexion 124   Strength   Right Hip ABduction 3+/5   Left Hip ABduction 3-/5   Right Knee Flexion 4+/5   Right Knee Extension 4+/5   Left Knee Flexion 5/5   Left Knee Extension 4+/5  [place] Adult PT Treatment/Exercise - March 14, 2016 1607    Self-Care   Other Self-Care Comments  self massage, tools for myofacial release of quads and hip    Knee/Hip Exercises: Stretches   Active Hamstring Stretch 2 reps;30 seconds   Other Knee/Hip Stretches ITB stretch 2 x 30 sec each    Knee/Hip Exercises: Supine   Other Supine Knee/Hip Exercises verbal review of HEP: LAQ, SLR hip flexion and stretching into flexion    Moist Heat Therapy   Moist Heat Location Knee  bil  in supine   Manual Therapy   Manual Therapy Soft tissue mobilization;Myofascial release   Soft tissue mobilization IASTM of bil distal quads   Myofascial Release manual trigger point release of bil distal quads with multiple points                PT Education - March 14, 2016 1633    Education provided Yes   Education Details HEP and ROM MMT improvement    Person(s) Educated Patient   Methods Explanation   Comprehension Verbalized understanding;Verbal cues required          PT Short Term Goals - March 14, 2016 1629    PT SHORT TERM GOAL #1   Title Pt will be I with initial HEP for ROM  and strength   Status Achieved   PT SHORT TERM GOAL #2   Title Pt will complete balance screen and set goal.    Status Achieved   PT SHORT TERM GOAL #3   Title Pt will report less stiffness (15-25%)  in Rt. knee to ease sitting, transitioning.    Baseline stiffness continues some days unchanged, some days less.   Status Partially Met           PT Long Term Goals - 03-14-16 1628    PT LONG TERM GOAL #1   Title Pt will be I with more advanced HEP and concepts of RICE, posture and lifting.    Status Achieved   PT LONG TERM GOAL #2   Title Pt will be able to stand/walk for 30 min and report only min pain in L hip, knees.    Status Achieved   PT LONG TERM GOAL #3   Title Pt will be able to report improvement in AM mobility, getting out of bed with min difficulty   Status Achieved   PT LONG TERM GOAL #4   Title Pt will be able to return to gym, pool for community fitness   Status Not Met   PT LONG TERM GOAL #5   Title Pt will improve balance score to ______ (TBA)   Status Deferred               Plan - 03/14/16 1634    Clinical Impression Statement Pt DC today.  Has met most of her goals, but still very weak in hip abduction bilaterally.  She was given info on self massage tools to "roll" out her quads, glutes.  Pt also very sore in bilateral anterior tibialiis.  Pt hopes to cont PT in [place] at the [facility] [place] once she transfers her medical care to the [place].    PT Next Visit Plan DC   PT Home Exercise Plan L2, hamstring, ITB, bridge and hip abd    Consulted and Agree with Plan of Care Patient          G-Codes - 2016/03/14 1638    Functional Assessment Tool Used FOTO   Functional Limitation Mobility: Walking and moving around  Mobility: Walking and Moving Around Current Status [phone]) At least 40 percent but less than 60 percent impaired, limited or restricted   Mobility: Walking and Moving Around Goal Status [phone]) At least 40 percent but less than 60 percent impaired,  limited or restricted   Mobility: Walking and Moving Around Discharge Status [phone]) At least 40 percent but less than 60 percent impaired, limited or restricted      Problem List Patient Active Problem List   Diagnosis Date Noted  . Abnormality of gait 06/03/2015  . S/P TKR (total knee replacement) 06/03/2015  . Chronic left SI joint pain 06/03/2015  . 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 Chronic insomnia 10/11/2014  . Hypotension, unspecified 03/16/2014  . DJD (degenerative joint disease) of knee 03/13/2014    Gabrielle [name] 03/04/2016, 4:40 PM  [facility] [address] [place], [place], [address] Phone: [phone]   Fax:  [phone]  Name: Gabrielle [name] MRN: [id number] Date of Birth: Aug 27, 1951    Raeford Razor, PT 03/04/2016 4:40 PM Phone: [phone] Fax: [phone]  PHYSICAL THERAPY DISCHARGE SUMMARY  Visits from Start of Care: 12  Current functional level related to goals / functional outcomes: See above for goals met   Remaining deficits: AROM, pain, swelling, persistance muscle soreness (diffuse in LEs) and joint stiffness in Rt. Knee, difficulty walking, hip and core weakness  Education / Equipment: HEP, self care   Plan: Patient agrees to discharge.  Patient goals were partially met. Patient is being discharged due to lack of progress.  ?????   [facility] and plateau of progress, pt needs cues for HEP routine.  No stronger in hips however knees improved in strength.    Raeford Razor, PT 03/04/2016 4:46 PM Phone: [phone] Fax: [phone]

## 2016-03-09 ENCOUNTER — Encounter: Payer: Self-pay | Admitting: Physical Therapy

## 2016-03-09 ENCOUNTER — Ambulatory Visit: Payer: No Typology Code available for payment source

## 2016-03-12 DIAGNOSIS — M542 Cervicalgia: Secondary | ICD-10-CM | POA: Insufficient documentation

## 2016-05-25 ENCOUNTER — Ambulatory Visit: Payer: No Typology Code available for payment source | Admitting: Physical Therapy

## 2016-06-01 ENCOUNTER — Ambulatory Visit: Payer: No Typology Code available for payment source | Attending: Family | Admitting: Physical Therapy

## 2016-06-01 DIAGNOSIS — R262 Difficulty in walking, not elsewhere classified: Secondary | ICD-10-CM | POA: Diagnosis present

## 2016-06-01 DIAGNOSIS — M25562 Pain in left knee: Secondary | ICD-10-CM

## 2016-06-01 DIAGNOSIS — M25552 Pain in left hip: Secondary | ICD-10-CM | POA: Diagnosis present

## 2016-06-01 DIAGNOSIS — M545 Low back pain, unspecified: Secondary | ICD-10-CM

## 2016-06-02 NOTE — Therapy (Signed)
[place], [place], [address] Phone: [phone]   Fax:  [phone]  Physical Therapy Evaluation  Patient Details  Name: Gabrielle [name] MRN: [id number] Date of Birth: Oct 01, 1951 Referring Provider: Dante Gang   Encounter Date: 06/01/2016      PT End of Session - 06/01/16 1509    Visit Number 1   Number of Visits 8   Date for PT Re-Evaluation 07/27/16   PT Start Time 0814   PT Stop Time 1500   PT Time Calculation (min) 45 min   Activity Tolerance Patient tolerated treatment well   Behavior During Therapy [facility] for tasks assessed/performed      Past Medical History  Diagnosis Date  . Insomnia     takes Trazodone nightly as needed and Ambien nightly   . Tinnitus     takes HCTZ daily to decrease pressure in ears  . Hearing loss     but doesn't have hearing aids  . Hypertension     hasn't been on meds for the past 5yr   . History of bronchitis     many many yrs ago  . Dizziness     rarely  . Arthritis   . Joint pain   . Joint swelling   . GERD (gastroesophageal reflux disease)     takes Omeprazole every other day  . Constipation     will occasionally take Milk of Mag  . History of colon polyps   . History of colitis   . Diverticulosis   . Anemia     as a child  . Cataracts, bilateral     immature  . Glaucoma     borderline and no drops required  . Anxiety     takes Xanax daily  . Complication of anesthesia   . PONV (postoperative nausea and vomiting)   . 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 Chronic insomnia 10/11/2014    Past Surgical History  Procedure Laterality Date  . Abdominal hysterectomy    . Tonsillectomy    . Appendectomy    . Tubal ligation    . Colonoscopy    . Total knee arthroplasty Right 03/13/2014    DR MURPHY  . Total knee arthroplasty Right 03/13/2014    Procedure: TOTAL KNEE ARTHROPLASTY;  Surgeon: DNinetta Lights MD;  Location: [facility]  Service: Orthopedics;   Laterality: Right;  . Tinnitus Right     There were no vitals filed for this visit.       Subjective Assessment - 06/01/16 1429    Subjective Patient comes in today with left hip pain, left knee pain, and right knee pain. Her script is for the Left IT band syndorme. Patient has had a cortisone shot. It wore out over time.    Pertinent History TKR Lt. LE 02/2014, SIJ pain, tinnitus, dizziness   Limitations Walking;Standing;Sitting   How long can you sit comfortably? 1 hour before it stiffens up    How long can you stand comfortably? > 15 minutes    How long can you walk comfortably? <30 min    Diagnostic tests Nothing recent    Currently in Pain? Yes   Pain Score 7    Pain Location Hip   Pain Orientation Left;Posterior   Pain Descriptors / Indicators Sore   Pain Type Chronic pain   Pain Onset More than a month ago   Pain Frequency Constant   Aggravating Factors  walking, standing, Sit to stand transfer  Pain Relieving Factors rest,    Effect of Pain on Daily Activities difficulty moving with ADL's    Pain Score 6   Pain Location Knee   Pain Orientation Left   Pain Descriptors / Indicators Aching   Pain Type Chronic pain   Pain Onset More than a month ago   Pain Frequency Intermittent   Aggravating Factors  walking/ standing   Pain Relieving Factors rest            [facility] PT Assessment - 06/02/16 0001    Assessment   Medical Diagnosis L hip and knee pain   Onset Date/Surgical Date --  Hip pain started around the same time as the knee sx   Next MD Visit Next month    Prior Therapy y   Precautions   Precautions None   Restrictions   Weight Bearing Restrictions No   Balance Screen   Has the patient fallen in the past 6 months No   [place] residence   Living Arrangements Alone   Type of [place] Access Level entry   Prior Function   Level of Independence Independent   Vocation Part time employment   Cognition    Overall Cognitive Status Within Functional Limits for tasks assessed   Sensation   Light Touch Appears Intact   Coordination   Gross Motor Movements are Fluid and Coordinated Not tested   ROM / Strength   AROM / PROM / Strength AROM;PROM   AROM   Overall AROM Comments pain with active knee and hip flexion   Strength   Overall Strength Comments Hip extension mesured in supine    Right Hip Flexion 4+/5   Right Hip ABduction 4+/5   Left Hip Flexion 3+/5   Left Hip ABduction 4/5   Right Knee Flexion 4+/5   Right Knee Extension 4+/5   Left Knee Flexion 4+/5   Left Knee Extension 3/5   Palpation   Palpation comment significant tenderness to palpation of the lef t gluteal into the left IT band   Bed Mobility   Bed Mobility --  Pt reports difficulty lying on her back    Transfers   Comments Diffiuclty rolling to her stomach    Ambulation/Gait   Gait Comments Flexed trunk; limited hip rotation; limited left hip flexion; limited left single leg stance time; L sided trendelenburg.                   [facility] Adult PT Treatment/Exercise - 06/02/16 0001    Self-Care   Other Self-Care Comments  Educated patient on preogression of home exercises and symptom mangement.    Lumbar Exercises: Stretches   Passive Hamstring Stretch Limitations 3x20sec    Single Knee to Chest Stretch 3 reps;20 seconds   Lower Trunk Rotation Limitations x10    Piriformis Stretch Limitations 2x20sec hold                   PT Short Term Goals - 06/01/16 1528    PT SHORT TERM GOAL #1   Title Pt will be I with initial HEP for ROM and strength   Time 4   Period Weeks   Status New   PT SHORT TERM GOAL #2   Title Patient will increase left hip flexion and abducation strength by 1 grade    Time 4   Period Weeks   Status New   PT SHORT TERM GOAL #3  Title Patient will report 2/10 pain at worst in her hip    Baseline --   Time 4   Period Weeks   Status New   PT SHORT TERM GOAL #4   Title  Patient will report decreased tenderness to palpation in her lower back and IT band   Time 4   Period Weeks   Status New           PT Long Term Goals - 06/01/16 1532    PT LONG TERM GOAL #1   Title Patient will ambulate around the grocery store without report of pain    Time 8   Period Weeks   Status New   PT LONG TERM GOAL #2   Title Patient will stand for > 30 min in order to perfrom household activitys    Time 8   Period Weeks   Status New   PT LONG TERM GOAL #3   Title Patient will bend to pick items off the gorund without increased pain    Time 8   Period Weeks   Status On-going   PT LONG TERM GOAL #4   Title Fraser Din will demsotrate 4+/5 hip flexion and abduction strength in order to improve ability to perfrom IADL's    Time 8   Period Weeks   Status Not Met               Plan - 06/01/16 1511    Clinical Impression Statement Patient is a 65 year old female with left hip and knee pain. She has weakness in her abductors and flexors of her hip. She has pain when walking and standing. She has decreased single leg stability on the left side. She is hesitant to move her left lower extremity. She  recived extesnive education on the improtance of begining to move and strengthen her left lower extremity. She would benefit from skilled therapy to adress the above deficits. She was seen for a  low complexity evaluation.    Rehab Potential Good   PT Frequency 2x / week   PT Duration 6 weeks   PT Treatment/Interventions Ultrasound;Neuromuscular re-education;Passive range of motion;Patient/family education;Gait training;Dry needling;Cryotherapy;Electrical Stimulation;Iontophoresis 66m/ml Dexamethasone;Moist Heat;Balance training;Manual techniques;Therapeutic exercise;Therapeutic activities;Vasopneumatic Device;Functional mobility training;Taping   PT Next Visit Plan side lying IT band roll out; quad sets, SAQ, LAQ, continue heel slides, side sying glam shell if able, standing  ,march; continue with modalieites PRN.    PT Home Exercise Plan piriformis stretch, hamstring stretch, single knee to chest stretch; supine marching; Supine heel slide.    Consulted and Agree with Plan of Care Patient      Patient will benefit from skilled therapeutic intervention in order to improve the following deficits and impairments:  Abnormal gait, Decreased range of motion, Difficulty walking, Increased fascial restricitons, Decreased endurance, Obesity, Pain, Decreased activity tolerance, Decreased balance, Impaired flexibility, Improper body mechanics, Decreased mobility, Decreased strength, Postural dysfunction, Increased edema  Visit Diagnosis: Pain in left knee  Difficulty walking  Left-sided low back pain without sciatica  Pain in left hip      G-Codes - [id number]    Functional Assessment Tool Used FOTO, clinical decision making    Functional Limitation Mobility: Walking and moving around   Mobility: Walking and Moving Around Current Status (([id number] At least 40 percent but less than 60 percent impaired, limited or restricted   Mobility: Walking and Moving Around Goal Status (([id number] At least 20 percent but less than 40 percent impaired, limited or restricted  Problem List Patient Active Problem List   Diagnosis Date Noted  . Abnormality of gait 06/03/2015  . S/P TKR (total knee replacement) 06/03/2015  . Chronic left SI joint pain 06/03/2015  . 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 Chronic insomnia 10/11/2014  . Hypotension, unspecified 03/16/2014  . DJD (degenerative joint disease) of knee 03/13/2014    Carney Living PT DPT  06/02/2016, 2:52 PM  [facility] [address] [place], [place], [address] Phone: [phone]   Fax:  [phone]  Name: Gabrielle [name] MRN: [id number] Date of Birth: May 13, 1951

## 2016-06-03 DIAGNOSIS — H93293 Other abnormal auditory perceptions, bilateral: Secondary | ICD-10-CM | POA: Insufficient documentation

## 2016-07-05 ENCOUNTER — Ambulatory Visit: Payer: No Typology Code available for payment source | Admitting: Physical Therapy

## 2016-07-07 ENCOUNTER — Ambulatory Visit: Payer: No Typology Code available for payment source | Attending: Family | Admitting: Physical Therapy

## 2016-07-07 DIAGNOSIS — M545 Low back pain, unspecified: Secondary | ICD-10-CM

## 2016-07-07 DIAGNOSIS — R262 Difficulty in walking, not elsewhere classified: Secondary | ICD-10-CM | POA: Insufficient documentation

## 2016-07-07 DIAGNOSIS — M25562 Pain in left knee: Secondary | ICD-10-CM | POA: Insufficient documentation

## 2016-07-07 DIAGNOSIS — M25552 Pain in left hip: Secondary | ICD-10-CM | POA: Insufficient documentation

## 2016-07-07 NOTE — Therapy (Signed)
[place], [place], [address] Phone: [phone]   Fax:  [phone]  Physical Therapy Treatment  Patient Details  Name: Gabrielle [name] MRN: [id number] Date of Birth: [birth year] Referring Provider: Dante Gang   Encounter Date: 07/07/2016      PT End of Session - 07/07/16 1531    Visit Number 2   Number of Visits 8   Date for PT Re-Evaluation 07/27/16   Authorization Type [facility]    Authorization Time Period 8 visits   PT Start Time 1335   PT Stop Time 1424   PT Time Calculation (min) 49 min   Activity Tolerance Patient tolerated treatment well   Behavior During Therapy Gabrielle [name] for tasks assessed/performed      Past Medical History  Diagnosis Date  . Insomnia     takes Trazodone nightly as needed and Ambien nightly   . Tinnitus     takes HCTZ daily to decrease pressure in ears  . Hearing loss     but doesn't have hearing aids  . Hypertension     hasn't been on meds for the past 7yr   . History of bronchitis     many many yrs ago  . Dizziness     rarely  . Arthritis   . Joint pain   . Joint swelling   . GERD (gastroesophageal reflux disease)     takes Omeprazole every other day  . Constipation     will occasionally take Milk of Mag  . History of colon polyps   . History of colitis   . Diverticulosis   . Anemia     as a child  . Cataracts, bilateral     immature  . Glaucoma     borderline and no drops required  . Anxiety     takes Xanax daily  . Complication of anesthesia   . PONV (postoperative nausea and vomiting)   . 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 Chronic insomnia 10/11/2014    Past Surgical History  Procedure Laterality Date  . Abdominal hysterectomy    . Tonsillectomy    . Appendectomy    . Tubal ligation    . Colonoscopy    . Total knee arthroplasty Right 03/13/2014    DR MURPHY  . Total knee arthroplasty Right 03/13/2014    Procedure: TOTAL KNEE ARTHROPLASTY;   Surgeon: DNinetta Lights MD;  [facility]  Service: Orthopedics;  Laterality: Right;  . Tinnitus Right     There were no vitals filed for this visit.      Subjective Assessment - 07/07/16 1343    Subjective Patient reports that her left hip felt better after the last visit  The pain was less for a few days. She comes in today with the pain at about the same level.    Pertinent History TKR Lt. LE 02/2014, SIJ pain, tinnitus, dizziness   Limitations Walking;Standing;Sitting   How long can you sit comfortably? 1 hour before it stiffens up    How long can you stand comfortably? > 15 minutes    How long can you walk comfortably? <30 min    Diagnostic tests Nothing recent    Patient Stated Goals wants to be able to walk longer, do exercise and avoid surgery.    Currently in Pain? Yes   Pain Score 7    Pain Location Hip   Pain Orientation Left;Right;Posterior   Pain Descriptors / Indicators Sore   Pain  Type Chronic pain   Pain Onset More than a month ago   Pain Frequency Constant   Aggravating Factors  walking and standing    Effect of Pain on Daily Activities rest   Multiple Pain Sites Yes   Pain Score 6   Pain Location Knee   Pain Orientation Left   Pain Descriptors / Indicators Aching   Pain Onset More than a month ago   Pain Frequency Intermittent   Aggravating Factors  walking/ standing    Pain Relieving Factors rest   Effect of Pain on Daily Activities painful with standing, limits exercise.                          [facility] Adult PT Treatment/Exercise - 07/07/16 0001    Manual Therapy   Manual Therapy Soft tissue mobilization;Myofascial release   Myofascial Release Manual IT band roll out with wand..                 PT Education - 07/07/16 1527    Education provided Yes   Education Details continue with exercises to improve mobility and strength, discussed gait technqiue wqith patient. Updated HEP    Person(s) Educated Patient   Methods  Explanation   Comprehension Returned demonstration;Verbalized understanding          PT Short Term Goals - 06/01/16 1528    PT SHORT TERM GOAL #1   Title Pt will be I with initial HEP for ROM and strength   Time 4   Period Weeks   Status New   PT SHORT TERM GOAL #2   Title Patient will increase left hip flexion and abducation strength by 1 grade    Time 4   Period Weeks   Status New   PT SHORT TERM GOAL #3   Title Patient will report 2/10 pain at worst in her hip    Baseline --   Time 4   Period Weeks   Status New   PT SHORT TERM GOAL #4   Title Patient will report decreased tenderness to palpation in her lower back and IT band   Time 4   Period Weeks   Status New           PT Long Term Goals - 06/01/16 1532    PT LONG TERM GOAL #1   Title Patient will ambulate around the grocery store without report of pain    Time 8   Period Weeks   Status New   PT LONG TERM GOAL #2   Title Patient will stand for > 30 min in order to perfrom household activitys    Time 8   Period Weeks   Status New   PT LONG TERM GOAL #3   Title Patient will bend to pick items off the gorund without increased pain    Time 8   Period Weeks   Status On-going   PT LONG TERM GOAL #4   Title Fraser Din will demsotrate 4+/5 hip flexion and abduction strength in order to improve ability to perfrom IADL's    Time 8   Period Weeks   Status Not Met               Plan - 07/07/16 1532    Clinical Impression Statement Patient 5 min late for her appoitnment. Pain with basic ther-ex today. She has pain in her left hip and her left knee. Added exercises to her HEP. She was encouraged  to continue with exercises at home. She would like to ride a bike around her parking lot. PT advised her this would likely be too much . She was only able to ride about 3 min on a recumbnet bike. Therapy worked on IT band and glut roll out.    Rehab Potential Good   PT Frequency 2x / week   PT Duration 6 weeks   PT  Treatment/Interventions Ultrasound;Neuromuscular re-education;Passive range of motion;Patient/family education;Gait training;Dry needling;Cryotherapy;Electrical Stimulation;Iontophoresis 5m/ml Dexamethasone;Moist Heat;Balance training;Manual techniques;Therapeutic exercise;Therapeutic activities;Vasopneumatic Device;Functional mobility training;Taping   PT Next Visit Plan side lying IT band roll out; quad sets, SAQ, LAQ, continue heel slides, side sying glam shell if able, standing ,march; continue with modalieites PRN.    PT Home Exercise Plan piriformis stretch, hamstring stretch, single knee to chest stretch; supine marching; Supine heel slide.    Consulted and Agree with Plan of Care Patient      Patient will benefit from skilled therapeutic intervention in order to improve the following deficits and impairments:  Abnormal gait, Decreased range of motion, Difficulty walking, Increased fascial restricitons, Decreased endurance, Obesity, Pain, Decreased activity tolerance, Decreased balance, Impaired flexibility, Improper body mechanics, Decreased mobility, Decreased strength, Postural dysfunction, Increased edema  Visit Diagnosis: Pain in left knee  Difficulty walking  Left-sided low back pain without sciatica  Pain in left hip     Problem List Patient Active Problem List   Diagnosis Date Noted  . Abnormality of gait 06/03/2015  . S/P TKR (total knee replacement) 06/03/2015  . Chronic left SI joint pain 06/03/2015  . 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 Chronic insomnia 10/11/2014  . Hypotension, unspecified 03/16/2014  . DJD (degenerative joint disease) of knee 03/13/2014    DCarney Living PT DPT    07/07/2016, 3:37 PM  [facility] [address]Phone: [phone]  Fax:  [phone] Name: Gabrielle HINKLEYMRN: [id number] of Birth: 7June 29, 1952

## 2016-07-12 ENCOUNTER — Encounter: Payer: Self-pay | Admitting: Physical Therapy

## 2016-07-15 ENCOUNTER — Ambulatory Visit: Payer: No Typology Code available for payment source | Admitting: Physical Therapy

## 2016-07-15 DIAGNOSIS — M25552 Pain in left hip: Secondary | ICD-10-CM

## 2016-07-15 DIAGNOSIS — R262 Difficulty in walking, not elsewhere classified: Secondary | ICD-10-CM

## 2016-07-15 DIAGNOSIS — M545 Low back pain, unspecified: Secondary | ICD-10-CM

## 2016-07-15 DIAGNOSIS — M25562 Pain in left knee: Secondary | ICD-10-CM | POA: Diagnosis not present

## 2016-07-16 NOTE — Therapy (Addendum)
[place], [place], [address] Phone: [phone]   Fax:  [phone]  Physical Therapy Treatment  Patient Details  Name: Gabrielle [name] MRN: [id number] Date of Birth: Feb 12, 1951 Referring Provider: Dante Gang   Encounter Date: 07/15/2016      PT End of Session - 07/15/16 1550    Visit Number 3   Number of Visits 9   Date for PT Re-Evaluation 07/27/16   Authorization Type [facility]    Authorization Time Period 8 visits   PT Start Time 4967   PT Stop Time 1630   PT Time Calculation (min) 45 min   Activity Tolerance Patient tolerated treatment well   Behavior During Therapy [facility] for tasks assessed/performed      Past Medical History  Diagnosis Date  . Insomnia     takes Trazodone nightly as needed and Ambien nightly   . Tinnitus     takes HCTZ daily to decrease pressure in ears  . Hearing loss     but doesn't have hearing aids  . Hypertension     hasn't been on meds for the past 57yr   . History of bronchitis     many many yrs ago  . Dizziness     rarely  . Arthritis   . Joint pain   . Joint swelling   . GERD (gastroesophageal reflux disease)     takes Omeprazole every other day  . Constipation     will occasionally take Milk of Mag  . History of colon polyps   . History of colitis   . Diverticulosis   . Anemia     as a child  . Cataracts, bilateral     immature  . Glaucoma     borderline and no drops required  . Anxiety     takes Xanax daily  . Complication of anesthesia   . PONV (postoperative nausea and vomiting)   . 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 Chronic insomnia 10/11/2014    Past Surgical History  Procedure Laterality Date  . Abdominal hysterectomy    . Tonsillectomy    . Appendectomy    . Tubal ligation    . Colonoscopy    . Total knee arthroplasty Right 03/13/2014    DR MURPHY  . Total knee arthroplasty Right 03/13/2014    Procedure: TOTAL KNEE ARTHROPLASTY;   Surgeon: DNinetta Lights MD;  [place]  Service: Orthopedics;  Laterality: Right;  . Tinnitus Right     There were no vitals filed for this visit.      Subjective Assessment - 07/15/16 1553    Subjective Patient had a fall last Saturday. She hit her right arm and her right hip. She feels soreness but she does not feel like she his her knees. She mistepped onto a stool when she was getting into bed. Upon ispection of her knee it was found she had severe pain in her lateral patella and in her lateral knee.    Pertinent History TKR Lt. LE 02/2014, SIJ pain, tinnitus, dizziness   Limitations Walking;Standing;Sitting   How long can you sit comfortably? 1 hour before it stiffens up    How long can you walk comfortably? <30 min    Diagnostic tests Nothing recent    Patient Stated Goals wants to be able to walk longer, do exercise and avoid surgery.    Currently in Pain? Yes   Pain Score 8    Pain Location Knee  Pain Orientation Left   Pain Type Chronic pain   Pain Onset More than a month ago   Pain Frequency Constant   Aggravating Factors  walking / standing    Pain Relieving Factors rest    Effect of Pain on Daily Activities difficulty walking    Multiple Pain Sites No                         [facility] Adult PT Treatment/Exercise - 07/16/16 0001    Lumbar Exercises: Stretches   Passive Hamstring Stretch Limitations 3x20sec    Single Knee to Chest Stretch 3 reps;20 seconds   Lower Trunk Rotation Limitations x10    Piriformis Stretch Limitations 2x20sec hold needed some assitance with stretch 2nd to pain in her knee.    Lumbar Exercises: Aerobic   Stationary Bike x4 min Nu step    Lumbar Exercises: Standing   Other Standing Lumbar Exercises standing march 2x10 with left; hip abduction with left 2x10                 PT Education - 07/15/16 1552    Education provided Yes   Education Details continue with exercises in the pool , if severe lateral pain  continues contact MD   Person(s) Educated Patient   Methods Explanation   Comprehension Verbalized understanding;Returned demonstration          PT Short Term Goals - 06/01/16 1528    PT SHORT TERM GOAL #1   Title Pt will be I with initial HEP for ROM and strength   Time 4   Period Weeks   Status New   PT SHORT TERM GOAL #2   Title Patient will increase left hip flexion and abducation strength by 1 grade    Time 4   Period Weeks   Status New   PT SHORT TERM GOAL #3   Title Patient will report 2/10 pain at worst in her hip    Baseline --   Time 4   Period Weeks   Status New   PT SHORT TERM GOAL #4   Title Patient will report decreased tenderness to palpation in her lower back and IT band   Time 4   Period Weeks   Status New           PT Long Term Goals - 06/01/16 1532    PT LONG TERM GOAL #1   Title Patient will ambulate around the grocery store without report of pain    Time 8   Period Weeks   Status New   PT LONG TERM GOAL #2   Title Patient will stand for > 30 min in order to perfrom household activitys    Time 8   Period Weeks   Status New   PT LONG TERM GOAL #3   Title Patient will bend to pick items off the gorund without increased pain    Time 8   Period Weeks   Status On-going   PT LONG TERM GOAL #4   Title [name] will demsotrate 4+/5 hip flexion and abduction strength in order to improve ability to perfrom IADL's    Time 8   Period Weeks   Status Not Met               Plan - 07/16/16 0751    Clinical Impression Statement Patient was significantly limited today. She had intense pain with simple stretching. Therapy perfromed manual therapy to to reduce inflammation  in the knee. Therapy also added e-stim to the quad to reduce inflammation.  She was advised if pain continued to  go to  doctor to have her knee looked at. Limited treatment today.    Rehab Potential Good   PT Frequency 2x / week   PT Duration 6 weeks   PT Treatment/Interventions  Ultrasound;Neuromuscular re-education;Passive range of motion;Patient/family education;Gait training;Dry needling;Cryotherapy;Electrical Stimulation;Iontophoresis 9m/ml Dexamethasone;Moist Heat;Balance training;Manual techniques;Therapeutic exercise;Therapeutic activities;Vasopneumatic Device;Functional mobility training;Taping   PT Next Visit Plan side lying IT band roll out; quad sets, SAQ, LAQ, continue heel slides, side sying glam shell if able, standing ,march; continue with modalieites PRN.    PT Home Exercise Plan piriformis stretch, hamstring stretch, single knee to chest stretch; supine marching; Supine heel slide.    Consulted and Agree with Plan of Care Patient      Patient will benefit from skilled therapeutic intervention in order to improve the following deficits and impairments:  Abnormal gait, Decreased range of motion, Difficulty walking, Increased fascial restricitons, Decreased endurance, Obesity, Pain, Decreased activity tolerance, Decreased balance, Impaired flexibility, Improper body mechanics, Decreased mobility, Decreased strength, Postural dysfunction, Increased edema  Visit Diagnosis: Pain in left knee  Difficulty walking  Left-sided low back pain without sciatica  Pain in left hip    PHYSICAL THERAPY DISCHARGE SUMMARY  Visits from Start of Care: 3 Current functional level related to goals / functional outcomes: unknown    Remaining deficits: Unknown   Education / Equipment: unknown  Plan: Patient agrees to discharge.  Patient goals were not met. Patient is being discharged due to not returning since the last visit.  ?????      Problem List Patient Active Problem List   Diagnosis Date Noted  . Abnormality of gait 06/03/2015  . S/P TKR (total knee replacement) 06/03/2015  . Chronic left SI joint pain 06/03/2015  . 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 Chronic insomnia 10/11/2014  . Hypotension, unspecified 03/16/2014  . DJD (degenerative joint  disease) of knee 03/13/2014    DCarney LivingPT DPT  07/16/2016, 8:11 AM  [facility]GNe[address] [place] [address]Phone: [phone]  Fax:  [phone] Name: Gabrielle DEARMANMRN: [id number] of Birth: 709-06-1951

## 2016-07-28 ENCOUNTER — Encounter: Payer: Self-pay | Admitting: Physical Therapy

## 2016-11-08 ENCOUNTER — Ambulatory Visit (INDEPENDENT_AMBULATORY_CARE_PROVIDER_SITE_OTHER): Payer: Medicare Other | Admitting: Neurology

## 2016-11-08 ENCOUNTER — Encounter: Payer: Self-pay | Admitting: Neurology

## 2016-11-08 VITALS — BP 148/76 | HR 68 | Ht 64.0 in | Wt 235.0 lb

## 2016-11-08 DIAGNOSIS — H9313 Tinnitus, bilateral: Secondary | ICD-10-CM

## 2016-11-08 DIAGNOSIS — H903 Sensorineural hearing loss, bilateral: Secondary | ICD-10-CM

## 2016-11-08 NOTE — Progress Notes (Signed)
Chart forwarded.  
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
## 2016-11-30 DIAGNOSIS — H6123 Impacted cerumen, bilateral: Secondary | ICD-10-CM | POA: Insufficient documentation

## 2017-01-06 DIAGNOSIS — J00 Acute nasopharyngitis [common cold]: Secondary | ICD-10-CM | POA: Diagnosis not present

## 2017-01-11 DIAGNOSIS — H9313 Tinnitus, bilateral: Secondary | ICD-10-CM | POA: Diagnosis not present

## 2017-01-11 DIAGNOSIS — I10 Essential (primary) hypertension: Secondary | ICD-10-CM | POA: Diagnosis not present

## 2017-01-11 DIAGNOSIS — R002 Palpitations: Secondary | ICD-10-CM | POA: Diagnosis not present

## 2017-01-14 DIAGNOSIS — H9319 Tinnitus, unspecified ear: Secondary | ICD-10-CM | POA: Diagnosis not present

## 2017-01-14 DIAGNOSIS — R002 Palpitations: Secondary | ICD-10-CM | POA: Diagnosis not present

## 2017-01-14 DIAGNOSIS — I10 Essential (primary) hypertension: Secondary | ICD-10-CM | POA: Diagnosis not present

## 2017-01-26 DIAGNOSIS — H43813 Vitreous degeneration, bilateral: Secondary | ICD-10-CM | POA: Diagnosis not present

## 2017-01-26 DIAGNOSIS — H25813 Combined forms of age-related cataract, bilateral: Secondary | ICD-10-CM | POA: Diagnosis not present

## 2017-02-07 DIAGNOSIS — I10 Essential (primary) hypertension: Secondary | ICD-10-CM | POA: Diagnosis not present

## 2017-02-07 DIAGNOSIS — R238 Other skin changes: Secondary | ICD-10-CM | POA: Diagnosis not present

## 2017-02-07 DIAGNOSIS — R21 Rash and other nonspecific skin eruption: Secondary | ICD-10-CM | POA: Diagnosis not present

## 2017-02-21 DIAGNOSIS — I10 Essential (primary) hypertension: Secondary | ICD-10-CM | POA: Diagnosis not present

## 2017-02-21 DIAGNOSIS — A599 Trichomoniasis, unspecified: Secondary | ICD-10-CM | POA: Diagnosis not present

## 2017-02-28 DIAGNOSIS — D696 Thrombocytopenia, unspecified: Secondary | ICD-10-CM | POA: Diagnosis not present

## 2017-02-28 DIAGNOSIS — A599 Trichomoniasis, unspecified: Secondary | ICD-10-CM | POA: Diagnosis not present

## 2017-03-03 ENCOUNTER — Telehealth: Payer: Self-pay | Admitting: *Deleted

## 2017-03-03 NOTE — Telephone Encounter (Signed)
Patient has seen Dr Marin Olp in the past (2015) and is calling the office saying her MD would like her to see Dr Marin Olp again because her platelet count is down.  Patient started a new medication earlier this year which caused multiple side effects including a platelet drop to 85. She has since stopped the medication and her platelets this week were 97. She remembers Dr Marin Olp telling her that there was not a big concern, unless her platelets dropped below 60. She would rather not waste her time with a visit if it's not necessary.   Spoke to Dr Marin Olp. If the other physician feels it's necessary to see patient, he would like patient to come in.   Patient transferred to scheduler to make appointment.

## 2017-03-04 ENCOUNTER — Other Ambulatory Visit: Payer: Self-pay | Admitting: *Deleted

## 2017-03-04 ENCOUNTER — Other Ambulatory Visit (HOSPITAL_BASED_OUTPATIENT_CLINIC_OR_DEPARTMENT_OTHER): Payer: PPO

## 2017-03-04 ENCOUNTER — Ambulatory Visit (HOSPITAL_BASED_OUTPATIENT_CLINIC_OR_DEPARTMENT_OTHER): Payer: PPO | Admitting: Hematology & Oncology

## 2017-03-04 VITALS — BP 141/62 | HR 75 | Temp 98.2°F | Wt 233.0 lb

## 2017-03-04 DIAGNOSIS — D693 Immune thrombocytopenic purpura: Secondary | ICD-10-CM | POA: Diagnosis not present

## 2017-03-04 DIAGNOSIS — D696 Thrombocytopenia, unspecified: Secondary | ICD-10-CM | POA: Diagnosis not present

## 2017-03-04 LAB — CHCC SATELLITE - SMEAR

## 2017-03-04 LAB — CBC WITH DIFFERENTIAL (CANCER CENTER ONLY)
BASO#: 0 10*3/uL (ref 0.0–0.2)
BASO%: 0.3 % (ref 0.0–2.0)
EOS ABS: 0.1 10*3/uL (ref 0.0–0.5)
EOS%: 2 % (ref 0.0–7.0)
HCT: 39.6 % (ref 34.8–46.6)
HGB: 13.1 g/dL (ref 11.6–15.9)
LYMPH#: 2.3 10*3/uL (ref 0.9–3.3)
LYMPH%: 36.1 % (ref 14.0–48.0)
MCH: 29.2 pg (ref 26.0–34.0)
MCHC: 33.1 g/dL (ref 32.0–36.0)
MCV: 88 fL (ref 81–101)
MONO#: 0.5 10*3/uL (ref 0.1–0.9)
MONO%: 7.6 % (ref 0.0–13.0)
NEUT#: 3.5 10*3/uL (ref 1.5–6.5)
NEUT%: 54 % (ref 39.6–80.0)
RBC: 4.49 10*6/uL (ref 3.70–5.32)
RDW: 14 % (ref 11.1–15.7)
WBC: 6.5 10*3/uL (ref 3.9–10.0)

## 2017-03-04 NOTE — Progress Notes (Signed)
Hematology and Oncology Follow Up Visit  Gabrielle [name] [id number] 1951-09-09 66 y.o. 03/04/2017   Principle Diagnosis:   Chronic immune thrombocytopenia-mild  Current Therapy:    Observation     Interim History:  [name] is back for follow-up. Apparently, her family doctor wanted her to be seen again. We last saw her 3 years ago. I thought that she had ITP. I do not think that she needed any follow-up unless she was having bleeding issues or her platelet count was less than 50,000.  She recently hasn't changed her medications. She has had blood work done. Her platelet count was down to 85,000.  She told her family doctor that she did not need to see me. However, her family doctor insisted.  She has not had bleeding. She has had no rashes. She has had no change in bowel or bladder habits.  She was having some tinnitus. She was having some dizziness. She is placed on diltiazem. She is placed on Diamox. The Diamox caused some side effects that she is stopped taking this. She also stopped taking the diltiazem.  There's been no fever. She's had no issues with infections.  Overall, her performance status is ECOG 0.  Medications:  Current Outpatient Prescriptions:  .  acetaZOLAMIDE (DIAMOX) 250 MG tablet, Take 250 mg by mouth 2 (two) times daily., Disp: , Rfl:  .  ALPRAZolam (XANAX) 1 MG tablet, Take 1 mg by mouth 2 (two) times daily., Disp: , Rfl:  .  cholecalciferol (VITAMIN D) 1000 UNITS tablet, Take 2,000 Units by mouth daily. Reported on 06/01/2016, Disp: , Rfl:  .  polyethylene glycol (MIRALAX) packet, Take 17 g by mouth daily as needed. , Disp: , Rfl:  .  zolpidem (AMBIEN) 10 MG tablet, Take 10 mg by mouth at bedtime as needed for sleep. May take 1 and 1/2 tab = 15 mg, Disp: , Rfl:   Allergies:  Allergies  Allergen Reactions  . Contrast Media [Iodinated Diagnostic Agents] Hives  . Hydrocodone Other (See Comments)    insomnia    Past Medical History, Surgical history,  Social history, and Family History were reviewed and updated.  Review of Systems:  As above  Physical Exam:  weight is 233 lb (105.7 kg). Her oral temperature is 98.2 F (36.8 C). Her blood pressure is 141/62 (abnormal) and her pulse is 75.   Wt Readings from Last 3 Encounters:  03/04/17 233 lb (105.7 kg)  11/08/16 235 lb (106.6 kg)  06/02/15 200 lb (90.7 kg)      Well-developed and well-nourished African-American female in no obvious distress. Head and neck exam shows no ocular or oral lesions. There are no palpable cervical or supraclavicular lymph nodes. Lungs are clear bilaterally. Cardiac exam regular rate and rhythm with no murmurs, rubs or bruits. Abdomen is soft. She has good bowel sounds. There is no fluid wave. There is no palpable liver or spleen tip. Extremities shows no clubbing, cyanosis or edema. Neurological exam shows no focal neurological deficits. Skin exam shows no rashes, ecchymoses or petechia.  Lab Results  Component Value Date   WBC 6.5 03/04/2017   HGB 13.1 03/04/2017   HCT 39.6 03/04/2017   MCV 88 03/04/2017   PLT 102 Platelet count consistent in citrate (L) 03/04/2017     Chemistry      Component Value Date/Time   NA 141 03/30/2014 1305   K 3.6 (L) 03/30/2014 1305   CL 104 03/30/2014 1305   CO2 22 03/30/2014 1305  BUN 5 (L) 03/30/2014 1305   CREATININE 0.75 03/30/2014 1305      Component Value Date/Time   CALCIUM 8.8 03/30/2014 1305   ALKPHOS 110 03/30/2014 1305   AST 21 03/30/2014 1305   ALT 12 03/30/2014 1305   BILITOT 0.7 03/30/2014 1305         Impression and Plan: [name] is 66 year old Afro-American female. She has chronic thrombocytopenia. I have to believe that this is mild ITP. In 3 years, her blood count has not changed. She is very comfortable not doing anything. I told her that nothing has to be done for this.  I don't think we have to see her back unless she wants to be seen area and hopefully, her family doctors will not  get so excited about her platelet count. All the have to do is just call me and I will tell them what to do.  It was nice to see [name]. She is very funny. It is always a good time with her.   [name], MD 3/9/20184:49 PM

## 2017-03-29 DIAGNOSIS — Z1231 Encounter for screening mammogram for malignant neoplasm of breast: Secondary | ICD-10-CM | POA: Diagnosis not present

## 2017-03-29 DIAGNOSIS — Z803 Family history of malignant neoplasm of breast: Secondary | ICD-10-CM | POA: Diagnosis not present

## 2017-03-30 DIAGNOSIS — H9313 Tinnitus, bilateral: Secondary | ICD-10-CM | POA: Diagnosis not present

## 2017-03-30 DIAGNOSIS — H6123 Impacted cerumen, bilateral: Secondary | ICD-10-CM | POA: Diagnosis not present

## 2017-03-30 DIAGNOSIS — H918X3 Other specified hearing loss, bilateral: Secondary | ICD-10-CM | POA: Diagnosis not present

## 2017-03-30 DIAGNOSIS — H9113 Presbycusis, bilateral: Secondary | ICD-10-CM | POA: Insufficient documentation

## 2017-04-14 DIAGNOSIS — Z6839 Body mass index (BMI) 39.0-39.9, adult: Secondary | ICD-10-CM | POA: Diagnosis not present

## 2017-04-14 DIAGNOSIS — F411 Generalized anxiety disorder: Secondary | ICD-10-CM | POA: Diagnosis not present

## 2017-04-14 DIAGNOSIS — E785 Hyperlipidemia, unspecified: Secondary | ICD-10-CM | POA: Diagnosis not present

## 2017-04-14 DIAGNOSIS — I10 Essential (primary) hypertension: Secondary | ICD-10-CM | POA: Diagnosis not present

## 2017-04-14 DIAGNOSIS — M545 Low back pain: Secondary | ICD-10-CM | POA: Diagnosis not present

## 2017-05-03 DIAGNOSIS — K219 Gastro-esophageal reflux disease without esophagitis: Secondary | ICD-10-CM | POA: Diagnosis not present

## 2017-05-03 DIAGNOSIS — K5901 Slow transit constipation: Secondary | ICD-10-CM | POA: Diagnosis not present

## 2017-05-03 DIAGNOSIS — Z800 Family history of malignant neoplasm of digestive organs: Secondary | ICD-10-CM | POA: Diagnosis not present

## 2017-05-13 DIAGNOSIS — H9202 Otalgia, left ear: Secondary | ICD-10-CM | POA: Insufficient documentation

## 2017-05-16 DIAGNOSIS — Z6839 Body mass index (BMI) 39.0-39.9, adult: Secondary | ICD-10-CM | POA: Diagnosis not present

## 2017-05-16 DIAGNOSIS — E559 Vitamin D deficiency, unspecified: Secondary | ICD-10-CM | POA: Diagnosis not present

## 2017-05-16 DIAGNOSIS — I10 Essential (primary) hypertension: Secondary | ICD-10-CM | POA: Diagnosis not present

## 2017-05-16 DIAGNOSIS — E785 Hyperlipidemia, unspecified: Secondary | ICD-10-CM | POA: Diagnosis not present

## 2017-05-20 DIAGNOSIS — F411 Generalized anxiety disorder: Secondary | ICD-10-CM | POA: Diagnosis not present

## 2017-05-20 DIAGNOSIS — H9313 Tinnitus, bilateral: Secondary | ICD-10-CM | POA: Diagnosis not present

## 2017-06-02 ENCOUNTER — Other Ambulatory Visit (HOSPITAL_COMMUNITY): Payer: Self-pay | Admitting: Interventional Radiology

## 2017-06-02 DIAGNOSIS — I10 Essential (primary) hypertension: Secondary | ICD-10-CM | POA: Diagnosis not present

## 2017-06-02 DIAGNOSIS — H9319 Tinnitus, unspecified ear: Secondary | ICD-10-CM

## 2017-06-02 DIAGNOSIS — R7309 Other abnormal glucose: Secondary | ICD-10-CM | POA: Diagnosis not present

## 2017-06-02 DIAGNOSIS — E559 Vitamin D deficiency, unspecified: Secondary | ICD-10-CM | POA: Diagnosis not present

## 2017-06-06 DIAGNOSIS — Z0000 Encounter for general adult medical examination without abnormal findings: Secondary | ICD-10-CM | POA: Diagnosis not present

## 2017-06-06 DIAGNOSIS — H9319 Tinnitus, unspecified ear: Secondary | ICD-10-CM | POA: Diagnosis not present

## 2017-06-06 DIAGNOSIS — K219 Gastro-esophageal reflux disease without esophagitis: Secondary | ICD-10-CM | POA: Diagnosis not present

## 2017-06-06 DIAGNOSIS — R3121 Asymptomatic microscopic hematuria: Secondary | ICD-10-CM | POA: Diagnosis not present

## 2017-06-06 DIAGNOSIS — I10 Essential (primary) hypertension: Secondary | ICD-10-CM | POA: Diagnosis not present

## 2017-06-06 DIAGNOSIS — G4700 Insomnia, unspecified: Secondary | ICD-10-CM | POA: Diagnosis not present

## 2017-06-06 DIAGNOSIS — E2839 Other primary ovarian failure: Secondary | ICD-10-CM | POA: Diagnosis not present

## 2017-06-08 ENCOUNTER — Ambulatory Visit (HOSPITAL_COMMUNITY)
Admission: RE | Admit: 2017-06-08 | Discharge: 2017-06-08 | Disposition: A | Discharge status: Home / Self Care | Payer: PPO | Source: Ambulatory Visit | Attending: Interventional Radiology | Admitting: Interventional Radiology

## 2017-06-08 DIAGNOSIS — H9313 Tinnitus, bilateral: Secondary | ICD-10-CM | POA: Diagnosis not present

## 2017-06-08 DIAGNOSIS — H9319 Tinnitus, unspecified ear: Secondary | ICD-10-CM

## 2017-06-08 HISTORY — PX: IR RADIOLOGIST EVAL & MGMT: IMG5224

## 2017-06-13 ENCOUNTER — Encounter (HOSPITAL_COMMUNITY): Payer: Self-pay | Admitting: Interventional Radiology

## 2017-06-17 DIAGNOSIS — N390 Urinary tract infection, site not specified: Secondary | ICD-10-CM | POA: Diagnosis not present

## 2017-06-24 ENCOUNTER — Other Ambulatory Visit (HOSPITAL_COMMUNITY): Payer: Self-pay | Admitting: Interventional Radiology

## 2017-06-24 ENCOUNTER — Telehealth (HOSPITAL_COMMUNITY): Payer: Self-pay | Admitting: Radiology

## 2017-06-24 DIAGNOSIS — H9319 Tinnitus, unspecified ear: Secondary | ICD-10-CM

## 2017-06-24 NOTE — Telephone Encounter (Signed)
Spoke to pt today concerning her questions about having a cerebral angiogram. All questions were answered to her satisfaction. JM

## 2017-06-27 ENCOUNTER — Emergency Department (HOSPITAL_COMMUNITY)
Admission: EM | Admit: 2017-06-27 | Discharge: 2017-06-27 | Disposition: A | Discharge status: Home / Self Care | Payer: PPO | Attending: Emergency Medicine | Admitting: Emergency Medicine

## 2017-06-27 ENCOUNTER — Emergency Department (HOSPITAL_COMMUNITY): Payer: PPO

## 2017-06-27 ENCOUNTER — Encounter (HOSPITAL_COMMUNITY): Payer: Self-pay | Admitting: Emergency Medicine

## 2017-06-27 DIAGNOSIS — R0789 Other chest pain: Secondary | ICD-10-CM | POA: Diagnosis not present

## 2017-06-27 DIAGNOSIS — I10 Essential (primary) hypertension: Secondary | ICD-10-CM | POA: Insufficient documentation

## 2017-06-27 DIAGNOSIS — Z87891 Personal history of nicotine dependence: Secondary | ICD-10-CM | POA: Diagnosis not present

## 2017-06-27 DIAGNOSIS — Z79899 Other long term (current) drug therapy: Secondary | ICD-10-CM | POA: Insufficient documentation

## 2017-06-27 DIAGNOSIS — Z96651 Presence of right artificial knee joint: Secondary | ICD-10-CM | POA: Diagnosis not present

## 2017-06-27 DIAGNOSIS — R079 Chest pain, unspecified: Secondary | ICD-10-CM | POA: Diagnosis not present

## 2017-06-27 LAB — CBC
HEMATOCRIT: 39 % (ref 36.0–46.0)
HEMOGLOBIN: 12.9 g/dL (ref 12.0–15.0)
MCH: 28.4 pg (ref 26.0–34.0)
MCHC: 33.1 g/dL (ref 30.0–36.0)
MCV: 85.9 fL (ref 78.0–100.0)
Platelets: 151 10*3/uL (ref 150–400)
RBC: 4.54 MIL/uL (ref 3.87–5.11)
RDW: 13.7 % (ref 11.5–15.5)
WBC: 6 10*3/uL (ref 4.0–10.5)

## 2017-06-27 LAB — BASIC METABOLIC PANEL
ANION GAP: 5 (ref 5–15)
BUN: 15 mg/dL (ref 6–20)
CALCIUM: 9 mg/dL (ref 8.9–10.3)
CHLORIDE: 107 mmol/L (ref 101–111)
CO2: 28 mmol/L (ref 22–32)
Creatinine, Ser: 0.86 mg/dL (ref 0.44–1.00)
GFR calc non Af Amer: >60 mL/min (ref >60)
GLUCOSE: 97 mg/dL (ref 65–99)
POTASSIUM: 4.4 mmol/L (ref 3.5–5.1)
Sodium: 140 mmol/L (ref 135–145)

## 2017-06-27 LAB — TROPONIN I

## 2017-06-27 LAB — POCT I-STAT TROPONIN I: Troponin i, poc: 0 ng/mL (ref 0.00–0.08)

## 2017-06-27 LAB — D-DIMER, QUANTITATIVE: D-Dimer, Quant: 0.58 ug/mL-FEU — ABNORMAL HIGH (ref 0.00–0.50)

## 2017-06-27 MED ORDER — METHOCARBAMOL 500 MG PO TABS: 500.0000 mg | ORAL_TABLET | Freq: Two times a day (BID) | ORAL | 0 refills | Status: DC

## 2017-06-27 MED ORDER — IBUPROFEN 600 MG PO TABS: 600.0000 mg | ORAL_TABLET | Freq: Four times a day (QID) | ORAL | 0 refills | Status: DC | PRN

## 2017-06-27 MED ORDER — IBUPROFEN 200 MG PO TABS
600.0000 mg | ORAL_TABLET | Freq: Once | ORAL | Status: AC
  Administered 2017-06-27: 600 mg via ORAL
  Filled 2017-06-27: qty 3

## 2017-06-27 NOTE — Discharge Instructions (Signed)
Continue taking your home medications as prescribed. Take your new prescriptions as needed for pain relief.  Follow up with your primary care provider in 1 week for follow up regarding your chest pain. Please return to the Emergency Department if symptoms worsen or new onset of fever, new/worsening chest pain, difficulty breathing, coughing up blood, abdominal pain, vomiting, numbness, weakness, syncope.

## 2017-06-27 NOTE — ED Notes (Signed)
Patient ambulated to and from bathroom at 100%

## 2017-06-27 NOTE — ED Triage Notes (Signed)
Patient c/o mid to right sided chest pain that started yesterday that is sharp and worse when patient takes a deep breath. Patient reports having cough week and half  Ago.

## 2017-06-27 NOTE — ED Provider Notes (Signed)
[facility] DEPT Provider Note   CSN: [id number] Arrival date & time: 06/27/17  0751     History   Chief Complaint Chief Complaint  Patient presents with  . Chest Pain    HPI Gabrielle [name] is a 66 y.o. female.  HPI   Patient is a 66 year old female with history of hypertension, GERD and anemia who presents to the ED with complaint of chest pain, onset yesterday. Patient reports having constant sharp pain to the right side of her chest which she notes it significantly worsened when taking a deep breath. Denies radiation. She notes her pain worsen this morning when she woke up resulting in her coming to the ED for evaluation. She reports having similar episode of chest pain years ago but denies any known etiology. Pt states she thought her CP was due to gas and reports taking OTC mylanta and other meds without relief. Denies fever, chills, headache, lightheadedness, cough, shortness of breath, palpitations, abdominal pain, nausea, vomiting, leg swelling, weakness. Denies any recent travel, hospitalizations or surgeries. Denies hx of CA. Denies personal or family hx of CAD. Denies smoking.   Past Medical History:  Diagnosis Date  . Anemia    as a child  . Anxiety    takes Xanax daily  . Arthritis   . Cataracts, bilateral    immature  . Chronic insomnia 10/11/2014  . Complication of anesthesia   . Constipation    will occasionally take Milk of Mag  . Diverticulosis   . Dizziness    rarely  . GERD (gastroesophageal reflux disease)    takes Omeprazole every other day  . Glaucoma    borderline and no drops required  . Hearing loss    but doesn't have hearing aids  . History of bronchitis    many many yrs ago  . History of colitis   . History of colon polyps   . Hypertension    hasn't been on meds for the past 54yrs   . Insomnia    takes Trazodone nightly as needed and Ambien nightly   . 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 Joint pain   . Joint swelling   . PONV  (postoperative nausea and vomiting)   . Tinnitus    takes HCTZ daily to decrease pressure in ears    Patient Active Problem List   Diagnosis Date Noted  . Abnormality of gait 06/03/2015  . S/P TKR (total knee replacement) 06/03/2015  . Chronic left SI joint pain 06/03/2015  . 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 Chronic insomnia 10/11/2014  . DJD (degenerative joint disease) of knee 03/13/2014    Past Surgical History:  Procedure Laterality Date  . ABDOMINAL HYSTERECTOMY    . APPENDECTOMY    . COLONOSCOPY    . IR RADIOLOGIST EVAL & MGMT  06/08/2017  . tinnitus Right   . TONSILLECTOMY    . TOTAL KNEE ARTHROPLASTY Right 03/13/2014   DR MURPHY  . TOTAL KNEE ARTHROPLASTY Right 03/13/2014   Procedure: TOTAL KNEE ARTHROPLASTY;  Surgeon: [name], MD;  Location: [facility];  Service: Orthopedics;  Laterality: Right;  . TUBAL LIGATION      OB History    No data available       Home Medications    Prior to Admission medications   Medication Sig Start Date End Date Taking? Authorizing Provider  acetaZOLAMIDE (DIAMOX) 250 MG tablet Take 250 mg by mouth 2 (two) times daily.    [provider]  ALPRAZolam (XANAX) 1 MG tablet Take 1 mg by mouth 2 (two) times daily.    [provider]  cholecalciferol (VITAMIN D) 1000 UNITS tablet Take 2,000 Units by mouth daily. Reported on 06/01/2016    [provider]  polyethylene glycol (MIRALAX) packet Take 17 g by mouth daily as needed.     [provider]  zolpidem (AMBIEN) 10 MG tablet Take 10 mg by mouth at bedtime as needed for sleep. May take 1 and 1/2 tab = 15 mg    [provider]    Family History Family History  Problem Relation Age of Onset  . Cancer - Other Father   . Kidney disease Father   . Cancer - Other Sister        breast ca  . Cancer - Other Brother        lymphoma in remission  . Cancer - Other Sister        in remission breast ca    Social History Social History    Substance Use Topics  . Smoking status: Former Smoker    Packs/day: 1.00    Years: 22.00    Types: Cigarettes    Start date: 03/08/1972    Quit date: 01/08/1994  . Smokeless tobacco: Never Used     Comment: quit smoking 48yrs ago  . Alcohol use No     Allergies   Contrast media [iodinated diagnostic agents] and Hydrocodone   Review of Systems Review of Systems  Cardiovascular: Positive for chest pain.  All other systems reviewed and are negative.    Physical Exam Updated Vital Signs BP (!) 144/84   Pulse (!) 116   Temp 97.9 F (36.6 C)   Resp 15   Ht 5\' 4"  (1.626 m)   Wt 105.2 kg (232 lb)   SpO2 100%   BMI 39.82 kg/m   Physical Exam  Constitutional: She is oriented to person, place, and time. She appears well-developed and well-nourished. No distress.  HENT:  Head: Normocephalic and atraumatic.  Mouth/Throat: Oropharynx is clear and moist. No oropharyngeal exudate.  Eyes: Conjunctivae and EOM are normal. Right eye exhibits no discharge. Left eye exhibits no discharge. No scleral icterus.  Neck: Normal range of motion. Neck supple.  Cardiovascular: Normal rate, regular rhythm, normal heart sounds and intact distal pulses.   Pulmonary/Chest: Effort normal and breath sounds normal. No respiratory distress. She has no wheezes. She has no rales. She exhibits no tenderness.  Abdominal: Soft. Bowel sounds are normal. She exhibits no distension and no mass. There is no tenderness. There is no rebound and no guarding.  Musculoskeletal: Normal range of motion. She exhibits no edema.  Neurological: She is alert and oriented to person, place, and time.  Skin: Skin is warm and dry. She is not diaphoretic.  Nursing note and vitals reviewed.    ED Treatments / Results  Labs (all labs ordered are listed, but only abnormal results are displayed) Labs Reviewed  D-DIMER, QUANTITATIVE (NOT AT [facility]) - Abnormal; Notable for the following:       Result Value   D-Dimer, Quant 0.58  (*)    All other components within normal limits  BASIC METABOLIC PANEL  CBC  TROPONIN I  I-STAT TROPOININ, ED  POCT I-STAT TROPONIN I    EKG  EKG Interpretation  Date/Time:  Monday June 27 2017 07:58:56 EDT Ventricular Rate:  61 PR Interval:    QRS Duration: 97 QT Interval:  434 QTC Calculation: 438 R Axis:  17 Text Interpretation:  Sinus rhythm Low voltage, precordial leads Baseline wander in lead(s) V2 since last tracing no significant change Confirmed by Daleen Bo [phone]) on 06/27/2017 8:03:51 AM Also confirmed by Daleen Bo [phone]), editor Radene Gunning [id number])  on 06/27/2017 8:37:15 AM       Radiology Dg Chest 2 View  Result Date: 06/27/2017 CLINICAL DATA:  Right-sided chest pain for 2 days EXAM: CHEST  2 VIEW COMPARISON:  03/30/2014 FINDINGS: Cardiac shadow is within normal limits. Aortic calcifications are noted. The lungs are well aerated bilaterally without focal infiltrate or sizable effusion. No acute bony abnormality is noted. IMPRESSION: No acute abnormality seen. Electronically Signed   By: Inez Catalina M.D.   On: 06/27/2017 08:23    Procedures Procedures (including critical care time)  Medications Ordered in ED Medications - No data to display   Initial Impression / Assessment and Plan / ED Course  I have reviewed the triage vital signs and the nursing notes.  Pertinent labs & imaging results that were available during my care of the patient were reviewed by me and considered in my medical decision making (see chart for details).    Patient presents with pleuritic chest pain that started yesterday and has gradually worsened. Denies fever, shortness of breath, cough. PMH of hypertension, GERD and anemia. Denies personal or family history of cardiac disease. VSS. Exam unremarkable. EKG showed sinus rhythm with no acute ischemic changes. Troponin negative. Labs unremarkable. D-dimer ordered for further evaluation of pleuritic chest pain due to concern for  PE. D-dimer negative with age-adjusted level. Discussed pt with Dr. Kathrynn Humble who also evaluated pt in the ED. Plan to order delta trop and ambulate pt with pulse ox. Delta trop negative. Pt ambulated on RA with O2 sat 100%. I have a low suspicion for ACS, PE, dissection, or other acute cardiac event at this time. Plan to d/c pt home with rx of NSAIDs and muscle relaxant. Advised to f/u with PCP in 1 week. Discussed strict return precautions.   Final Clinical Impressions(s) / ED Diagnoses   Final diagnoses:  Atypical chest pain    New Prescriptions New Prescriptions   No medications on file       Nona Dell, Hershal Coria 06/27/17 [place], [place], MD 06/28/17 Lurena Nida

## 2017-07-01 DIAGNOSIS — N390 Urinary tract infection, site not specified: Secondary | ICD-10-CM | POA: Diagnosis not present

## 2017-07-04 DIAGNOSIS — N390 Urinary tract infection, site not specified: Secondary | ICD-10-CM | POA: Diagnosis not present

## 2017-07-12 ENCOUNTER — Ambulatory Visit (HOSPITAL_COMMUNITY): Admission: RE | Admit: 2017-07-12 | Payer: PPO | Source: Ambulatory Visit

## 2017-07-12 ENCOUNTER — Encounter (HOSPITAL_COMMUNITY): Payer: Self-pay

## 2017-07-19 DIAGNOSIS — N760 Acute vaginitis: Secondary | ICD-10-CM | POA: Diagnosis not present

## 2017-07-19 DIAGNOSIS — R300 Dysuria: Secondary | ICD-10-CM | POA: Diagnosis not present

## 2017-07-27 DIAGNOSIS — M8589 Other specified disorders of bone density and structure, multiple sites: Secondary | ICD-10-CM | POA: Diagnosis not present

## 2017-07-27 DIAGNOSIS — Z780 Asymptomatic menopausal state: Secondary | ICD-10-CM | POA: Diagnosis not present

## 2017-08-31 DIAGNOSIS — H9313 Tinnitus, bilateral: Secondary | ICD-10-CM | POA: Diagnosis not present

## 2017-08-31 DIAGNOSIS — H9113 Presbycusis, bilateral: Secondary | ICD-10-CM | POA: Diagnosis not present

## 2017-09-13 DIAGNOSIS — Z23 Encounter for immunization: Secondary | ICD-10-CM | POA: Diagnosis not present

## 2017-09-13 DIAGNOSIS — G4700 Insomnia, unspecified: Secondary | ICD-10-CM | POA: Diagnosis not present

## 2017-09-13 DIAGNOSIS — I10 Essential (primary) hypertension: Secondary | ICD-10-CM | POA: Diagnosis not present

## 2017-09-13 DIAGNOSIS — M545 Low back pain: Secondary | ICD-10-CM | POA: Diagnosis not present

## 2017-09-13 DIAGNOSIS — E785 Hyperlipidemia, unspecified: Secondary | ICD-10-CM | POA: Diagnosis not present

## 2017-09-13 DIAGNOSIS — Z6839 Body mass index (BMI) 39.0-39.9, adult: Secondary | ICD-10-CM | POA: Diagnosis not present

## 2017-09-16 DIAGNOSIS — N760 Acute vaginitis: Secondary | ICD-10-CM | POA: Diagnosis not present

## 2017-09-21 DIAGNOSIS — L820 Inflamed seborrheic keratosis: Secondary | ICD-10-CM | POA: Diagnosis not present

## 2017-10-25 ENCOUNTER — Telehealth (INDEPENDENT_AMBULATORY_CARE_PROVIDER_SITE_OTHER): Payer: Self-pay | Admitting: Orthopedic Surgery

## 2017-10-25 NOTE — Telephone Encounter (Signed)
Please advise if ok to try to authorize. According to notes in old system you have only seen patient once and that was on 07/10/15. Per notes in system long hx of being treated at [facility]. Does she need OV to get xrays since been over 2 years, or ok to proceed with gel??  Please advise. Thanks.

## 2017-10-25 NOTE — Telephone Encounter (Signed)
Patient called asked if Dr Marlou Sa will see if she can get the gel injection. Patient advised the injection will need to be approved by the insurance company. The number to contact patient is [phone]

## 2017-10-25 NOTE — Telephone Encounter (Signed)
IC patient no answer. LMVM for her advising we would work on getting authorized for her and call her as soon as we knew something about status of BV/prior auth

## 2017-10-25 NOTE — Telephone Encounter (Signed)
Ok for gel?

## 2017-10-27 ENCOUNTER — Telehealth (INDEPENDENT_AMBULATORY_CARE_PROVIDER_SITE_OTHER): Payer: Self-pay

## 2017-10-27 NOTE — Telephone Encounter (Signed)
I have called patient and LMVM for her to call us back to update insurance information. I had tried submitting BV for gel injection and they advised insurance not accurate.

## 2017-11-02 ENCOUNTER — Telehealth (INDEPENDENT_AMBULATORY_CARE_PROVIDER_SITE_OTHER): Payer: Self-pay | Admitting: Orthopedic Surgery

## 2017-11-02 NOTE — Telephone Encounter (Signed)
IC s/w patient and advised unfortunately we did not have any additional time available for new patient slot on Monday afternoon. I apologized for the inconvenience but did schedule her an appointment to see Dr Marlou Sa on Wednesday afternoon.

## 2017-11-02 NOTE — Telephone Encounter (Signed)
Patient called wanting to see if she can be seen at the same time as her brother Gabrielle [name] (2:30p.m.).  She is wanting to see Dr. Marlou Sa for bilateral knee pain.  CB#[phone].  Thank you.

## 2017-11-09 ENCOUNTER — Ambulatory Visit (INDEPENDENT_AMBULATORY_CARE_PROVIDER_SITE_OTHER): Payer: PPO | Admitting: Orthopedic Surgery

## 2017-11-09 ENCOUNTER — Encounter (INDEPENDENT_AMBULATORY_CARE_PROVIDER_SITE_OTHER): Payer: Self-pay | Admitting: Orthopedic Surgery

## 2017-11-09 ENCOUNTER — Ambulatory Visit (INDEPENDENT_AMBULATORY_CARE_PROVIDER_SITE_OTHER): Payer: PPO

## 2017-11-09 DIAGNOSIS — M25561 Pain in right knee: Secondary | ICD-10-CM

## 2017-11-09 DIAGNOSIS — G8929 Other chronic pain: Secondary | ICD-10-CM | POA: Diagnosis not present

## 2017-11-09 DIAGNOSIS — M25562 Pain in left knee: Secondary | ICD-10-CM

## 2017-11-12 NOTE — Progress Notes (Signed)
Office Visit Note   Patient: Gabrielle [name]           Date of Birth: May 01, 1951           MRN: [id number] Visit Date: 11/09/2017 Requested by: Glendale Chard, [address] [place], [place] [address] PCP: Glendale Chard, MD  Subjective: Chief Complaint  Patient presents with  . Knee Pain    bilateral knee pain    HPI: Neoma Laming is a patient with bilateral knee pain.  She would like to get a watertight therapy for her knee pain.  She had right total knee replacement done 3 years ago.  She describes the right knee being stiff.  She states that the left knee is also sore primarily anteriorly.  She is able to go up and down stairs.  Cortisone helps at times.  She is not currently taking any pain medicine for the right or left knee except occasional Tylenol.  She states that anti-inflammatories aggravate her tinnitus.  She denies any fevers or chills.              ROS: All systems reviewed are negative as they relate to the chief complaint within the history of present illness.  Patient denies  fevers or chills.   Assessment & Plan: Visit Diagnoses:  1. Chronic pain of both knees     Plan: Impression is bilateral knee pain with well-functioning and well-appearing right total knee replacement and left knee that has fairly significant lateral facet patellofemoral arthritis.  Plan is water therapy.  Could consider injection if that doesn't help.  Knee replacement another option but she's Pretty good functional knee on the right but reports stiffness.  I will see her back as needed  Follow-Up Instructions: Return if symptoms worsen or fail to improve.   Orders:  Orders Placed This Encounter  Procedures  . XR Knee 1-2 Views Right  . XR KNEE 3 VIEW LEFT   No orders of the defined types were placed in this encounter.     Procedures: No procedures performed   Clinical Data: No additional findings.  Objective: Vital Signs: There were no vitals taken for this  visit.  Physical Exam:   Constitutional: Patient appears well-developed HEENT:  Head: Normocephalic Eyes:EOM are normal Neck: Normal range of motion Cardiovascular: Normal rate Pulmonary/chest: Effort normal Neurologic: Patient is alert Skin: Skin is warm Psychiatric: Patient has normal mood and affect    Ortho Exam: Orthopedic exam demonstrates pretty normal gait alignment palpable pedal pulses intact extensor mechanism on the right knee she has no effusion or warmth in range of motion to about 110 easily.  Collaterals are stable.  Incision well-healed.  No groin pain with internal/external rotation without right-hand side.  On the left she has patellofemoral arthritis and periretinacular tenderness but no effusion.  Collateral crucial ligaments also stable here.  No other masses lymph adenopathy or skin changes noted in the knee region.  Specialty Comments:  No specialty comments available.  Imaging: No results found.   PMFS History: Patient Active Problem List   Diagnosis Date Noted  . Abnormality of gait 06/03/2015  . S/P TKR (total knee replacement) 06/03/2015  . Chronic left SI joint pain 06/03/2015  . 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 Chronic insomnia 10/11/2014  . DJD (degenerative joint disease) of knee 03/13/2014   Past Medical History:  Diagnosis Date  . Anemia    as a child  . Anxiety    takes Xanax daily  .  Arthritis   . Cataracts, bilateral    immature  . Chronic insomnia 10/11/2014  . Complication of anesthesia   . Constipation    will occasionally take Milk of Mag  . Diverticulosis   . Dizziness    rarely  . GERD (gastroesophageal reflux disease)    takes Omeprazole every other day  . Glaucoma    borderline and no drops required  . Hearing loss    but doesn't have hearing aids  . History of bronchitis    many many yrs ago  . History of colitis   . History of colon polyps   . Hypertension    hasn't been on meds for the past 1yrs   .  Insomnia    takes Trazodone nightly as needed and Ambien nightly   . 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 Joint pain   . Joint swelling   . PONV (postoperative nausea and vomiting)   . Tinnitus    takes HCTZ daily to decrease pressure in ears    Family History  Problem Relation Age of Onset  . Cancer - Other Father   . Kidney disease Father   . Cancer - Other Sister        breast ca  . Cancer - Other Brother        lymphoma in remission  . Cancer - Other Sister        in remission breast ca    Past Surgical History:  Procedure Laterality Date  . ABDOMINAL HYSTERECTOMY    . APPENDECTOMY    . COLONOSCOPY    . IR RADIOLOGIST EVAL & MGMT  06/08/2017  . tinnitus Right   . TONSILLECTOMY    . TOTAL KNEE ARTHROPLASTY Right 03/13/2014   DR MURPHY  . TOTAL KNEE ARTHROPLASTY Right 03/13/2014   Performed by [name], MD at [facility]  . TUBAL LIGATION     Social History   Occupational History  . Not on file  Tobacco Use  . Smoking status: Former Smoker    Packs/day: 1.00    Years: 22.00    Pack years: 22.00    Types: Cigarettes    Start date: 03/08/1972    Last attempt to quit: 01/08/1994    Years since quitting: 23.8  . Smokeless tobacco: Never Used  . Tobacco comment: quit smoking 68yrs ago  Substance and Sexual Activity  . Alcohol use: No    Alcohol/week: 0.0 oz  . Drug use: No  . Sexual activity: Not on file

## 2017-11-23 DIAGNOSIS — H9113 Presbycusis, bilateral: Secondary | ICD-10-CM | POA: Diagnosis not present

## 2017-11-23 DIAGNOSIS — H9313 Tinnitus, bilateral: Secondary | ICD-10-CM | POA: Diagnosis not present

## 2017-12-07 DIAGNOSIS — F419 Anxiety disorder, unspecified: Secondary | ICD-10-CM | POA: Diagnosis not present

## 2017-12-07 DIAGNOSIS — D696 Thrombocytopenia, unspecified: Secondary | ICD-10-CM | POA: Diagnosis not present

## 2017-12-07 DIAGNOSIS — G4700 Insomnia, unspecified: Secondary | ICD-10-CM | POA: Diagnosis not present

## 2017-12-07 DIAGNOSIS — R030 Elevated blood-pressure reading, without diagnosis of hypertension: Secondary | ICD-10-CM | POA: Diagnosis not present

## 2017-12-09 DIAGNOSIS — R635 Abnormal weight gain: Secondary | ICD-10-CM | POA: Diagnosis not present

## 2017-12-09 DIAGNOSIS — E559 Vitamin D deficiency, unspecified: Secondary | ICD-10-CM | POA: Diagnosis not present

## 2017-12-09 DIAGNOSIS — N951 Menopausal and female climacteric states: Secondary | ICD-10-CM | POA: Diagnosis not present

## 2017-12-12 DIAGNOSIS — E039 Hypothyroidism, unspecified: Secondary | ICD-10-CM | POA: Diagnosis not present

## 2017-12-12 DIAGNOSIS — Z8679 Personal history of other diseases of the circulatory system: Secondary | ICD-10-CM | POA: Diagnosis not present

## 2017-12-12 DIAGNOSIS — R5383 Other fatigue: Secondary | ICD-10-CM | POA: Diagnosis not present

## 2017-12-12 DIAGNOSIS — R6882 Decreased libido: Secondary | ICD-10-CM | POA: Diagnosis not present

## 2017-12-12 DIAGNOSIS — Z6841 Body Mass Index (BMI) 40.0 and over, adult: Secondary | ICD-10-CM | POA: Diagnosis not present

## 2017-12-12 DIAGNOSIS — M2550 Pain in unspecified joint: Secondary | ICD-10-CM | POA: Diagnosis not present

## 2017-12-14 DIAGNOSIS — I10 Essential (primary) hypertension: Secondary | ICD-10-CM | POA: Diagnosis not present

## 2018-01-18 DIAGNOSIS — D696 Thrombocytopenia, unspecified: Secondary | ICD-10-CM | POA: Diagnosis not present

## 2018-01-18 DIAGNOSIS — I10 Essential (primary) hypertension: Secondary | ICD-10-CM | POA: Diagnosis not present

## 2018-01-20 DIAGNOSIS — S1086XA Insect bite of other specified part of neck, initial encounter: Secondary | ICD-10-CM | POA: Diagnosis not present

## 2018-01-20 DIAGNOSIS — R21 Rash and other nonspecific skin eruption: Secondary | ICD-10-CM | POA: Diagnosis not present

## 2018-02-02 DIAGNOSIS — H43813 Vitreous degeneration, bilateral: Secondary | ICD-10-CM | POA: Diagnosis not present

## 2018-02-02 DIAGNOSIS — H25813 Combined forms of age-related cataract, bilateral: Secondary | ICD-10-CM | POA: Diagnosis not present

## 2018-02-02 DIAGNOSIS — H35033 Hypertensive retinopathy, bilateral: Secondary | ICD-10-CM | POA: Diagnosis not present

## 2018-02-03 DIAGNOSIS — H9113 Presbycusis, bilateral: Secondary | ICD-10-CM | POA: Diagnosis not present

## 2018-02-27 ENCOUNTER — Telehealth: Payer: Self-pay | Admitting: Internal Medicine

## 2018-02-27 NOTE — Telephone Encounter (Signed)
Called patient for clarification. She is requesting to be a new patient of Dr Sharlet Salina. She has been searching for a provider for 6 months and has not been able to find anyone. Her sister, [name], is a current patient of yours and she recommended that she check with you.  Would you be willing to see her to establish care?

## 2018-02-27 NOTE — Telephone Encounter (Signed)
Copied from [place] [id number]. Topic: Appointment Scheduling - Prior Auth Required for Appointment >> Feb 27, 2018  9:12 AM Ahmed Prima L wrote: No appointment has been scheduled. Patient is requesting to be a new patient appointment. Per scheduling protocol, this appointment requires a prior authorization prior to scheduling. She was referred by her cousin, Loyal Gambler  Route to department's [facility] pool.

## 2018-02-28 NOTE — Telephone Encounter (Signed)
Appointment scheduled.

## 2018-02-28 NOTE — Telephone Encounter (Signed)
Fine

## 2018-03-02 DIAGNOSIS — K219 Gastro-esophageal reflux disease without esophagitis: Secondary | ICD-10-CM | POA: Diagnosis not present

## 2018-03-02 DIAGNOSIS — R1013 Epigastric pain: Secondary | ICD-10-CM | POA: Diagnosis not present

## 2018-03-03 LAB — CBC AND DIFFERENTIAL
HCT: 41 (ref 36–46)
Hemoglobin: 13.6 (ref 12.0–16.0)
WBC: 6.6

## 2018-03-03 LAB — HEPATIC FUNCTION PANEL
ALT: 20 (ref 7–35)
AST: 16 (ref 13–35)

## 2018-03-06 ENCOUNTER — Ambulatory Visit (INDEPENDENT_AMBULATORY_CARE_PROVIDER_SITE_OTHER): Payer: PPO | Admitting: Orthopedic Surgery

## 2018-03-14 ENCOUNTER — Encounter: Payer: Self-pay | Admitting: Internal Medicine

## 2018-03-14 ENCOUNTER — Ambulatory Visit (INDEPENDENT_AMBULATORY_CARE_PROVIDER_SITE_OTHER): Payer: PPO | Admitting: Internal Medicine

## 2018-03-14 ENCOUNTER — Telehealth: Payer: Self-pay

## 2018-03-14 VITALS — BP 130/88 | HR 65 | Temp 97.7°F | Ht 64.0 in | Wt 236.0 lb

## 2018-03-14 DIAGNOSIS — B9689 Other specified bacterial agents as the cause of diseases classified elsewhere: Secondary | ICD-10-CM

## 2018-03-14 DIAGNOSIS — H9313 Tinnitus, bilateral: Secondary | ICD-10-CM

## 2018-03-14 DIAGNOSIS — N760 Acute vaginitis: Secondary | ICD-10-CM

## 2018-03-14 DIAGNOSIS — F4312 Post-traumatic stress disorder, chronic: Secondary | ICD-10-CM | POA: Diagnosis not present

## 2018-03-14 DIAGNOSIS — Z0000 Encounter for general adult medical examination without abnormal findings: Secondary | ICD-10-CM | POA: Diagnosis not present

## 2018-03-14 DIAGNOSIS — F5104 Psychophysiologic insomnia: Secondary | ICD-10-CM

## 2018-03-14 DIAGNOSIS — H9319 Tinnitus, unspecified ear: Secondary | ICD-10-CM | POA: Insufficient documentation

## 2018-03-14 DIAGNOSIS — A599 Trichomoniasis, unspecified: Secondary | ICD-10-CM | POA: Insufficient documentation

## 2018-03-14 MED ORDER — METRONIDAZOLE 500 MG PO TABS: 500.0000 mg | ORAL_TABLET | Freq: Three times a day (TID) | ORAL | 0 refills | Status: DC

## 2018-03-14 NOTE — Telephone Encounter (Signed)
Patient stated before she left that the metronidazole was going to be sent in for her and when should she come back for labs

## 2018-03-14 NOTE — Assessment & Plan Note (Signed)
Taking ambien and prescribed by pscyh. Review of [place] controlled substance database without inappropriate fills.

## 2018-03-14 NOTE — Assessment & Plan Note (Signed)
Getting records from old providers as she feels she is up to date on all health screening.

## 2018-03-14 NOTE — Assessment & Plan Note (Signed)
Rx for flagyl today for symptoms which are consistent with bv. She understands if treatment fails she will need to return for evaluation and culture.

## 2018-03-14 NOTE — Assessment & Plan Note (Signed)
Severe and with some hearing loss and hearing aids. This causes her moderate disability in her life. She is aware to avoid loud noises or triggers.

## 2018-03-14 NOTE — Patient Instructions (Signed)
WE will see you back in about 6 months to check on the labs and make any changes if needed. Between now and then we will get you old records and if you can drop off labs from the [place] that would be helpful.

## 2018-03-14 NOTE — Assessment & Plan Note (Signed)
Seeing psych through [place] and private. They rx her xanax and ambien. She has tried and failed ssri in the past and declines trying these again ever.

## 2018-03-14 NOTE — Telephone Encounter (Signed)
Called patient to inform that the metronidazole was sent, but states she uses the gel with the five applicators not the tablets  Patient wanted to ask about esophogeal thrush/conditas in the throat. states that she looked into it and was thinking that this could be what is causing her issues with her throat burning. Patient is wanting to know if she can be tested for this or where could she go to get this tested. Will patient need an appointment.

## 2018-03-14 NOTE — Telephone Encounter (Signed)
Copied from [place]. Topic: Inquiry >> Mar 14, 2018  4:11 PM Oliver Pila B wrote: Reason for CRM: pt called to let her pcp or pcp's nurse call her back, no info disclosed b/c the matter is private but pt states its urgent, contact pt to advise

## 2018-03-14 NOTE — Progress Notes (Signed)
   Subjective:    Patient ID: Gabrielle [name], female    DOB: [birth year], 67 y.o.   MRN: [id number]  HPI The patient is a new 67 YO female coming in for continuation of her medical care including her chronic [facility] ([facility], has taken ssri in the past and did not like how they made her feel, taking xanax bid to tid and ambien for sleep) and her tinnitus (this is debilitating as she cannot do anything and cannot sleep without her meds, this causes her to not leave the house as it affects her hearing, uses hearing aid to help drown out the sound), and her intermittent bv (sometimes gets clear drainage which smells fishy and gets treatment, not sexually active and declines needs for std screening, denies burning with urination).   PMH, [facility], social history reviewed and updated  Review of Systems  Constitutional: Negative.   HENT: Positive for tinnitus.   Eyes: Negative.   Respiratory: Negative for cough, chest tightness and shortness of breath.   Cardiovascular: Negative for chest pain, palpitations and leg swelling.  Gastrointestinal: Negative for abdominal distention, abdominal pain, constipation, diarrhea, nausea and vomiting.  Genitourinary: Positive for vaginal discharge. Negative for decreased urine volume, difficulty urinating, enuresis, frequency, genital sores, menstrual problem, urgency and vaginal pain.  Musculoskeletal: Negative.   Skin: Negative.   Neurological: Negative.   Psychiatric/Behavioral: Negative.       Objective:   Physical Exam  Constitutional: She is oriented to person, place, and time. She appears well-developed and well-nourished.  Overweight  HENT:  Head: Normocephalic and atraumatic.  Eyes: EOM are normal.  Neck: Normal range of motion.  Cardiovascular: Normal rate and regular rhythm.  Pulmonary/Chest: Effort normal and breath sounds normal. No respiratory distress. She has no wheezes. She has no rales.  Abdominal: Soft. Bowel sounds  are normal. She exhibits no distension. There is no tenderness. There is no rebound.  Musculoskeletal: She exhibits no edema.  Neurological: She is alert and oriented to person, place, and time. Coordination normal.  Skin: Skin is warm and dry.  Psychiatric: She has a normal mood and affect.   Vitals:   03/14/18 1310  BP: 138/90  Pulse: 65  Temp: 97.7 F (36.5 C)  TempSrc: Oral  SpO2: 98%  Weight: 236 lb (107 kg)  Height: 5\' 4"  (1.626 m)      Assessment & Plan:

## 2018-03-15 MED ORDER — METRONIDAZOLE 0.75 % VA GEL: 1.0000 | Freq: Two times a day (BID) | VAGINAL | 0 refills | Status: DC

## 2018-03-15 NOTE — Telephone Encounter (Signed)
Sent in applicator metronidazole gel. She should bring in [place] labs since they were just done.

## 2018-03-15 NOTE — Telephone Encounter (Signed)
Patient is also inquiring about getting a bone density test done

## 2018-03-15 NOTE — Telephone Encounter (Signed)
Patient informed of MD response. Stated understanding and that she will be bringing in her lab results from the [place] and wants to make an appointment to talk to Dr. Sharlet Salina about the thrush issues she is having because it has been going on for awhile.

## 2018-03-15 NOTE — Telephone Encounter (Signed)
Patient states that the pills had made her sick in the past so she is paranoid about taking them and since her appetite has not been good and getting sick when eating she would rather have the gel. States she would also like her thyroid Vit D checked because she was told it was low and wants PCP opinion

## 2018-03-15 NOTE — Telephone Encounter (Signed)
The pills are the more recommended therapy with better success rate than gel. She does not have any reason to have thrush as she is not on any high risk medications or immunocompromised.

## 2018-03-16 ENCOUNTER — Encounter: Payer: Self-pay | Admitting: Internal Medicine

## 2018-03-16 DIAGNOSIS — N6321 Unspecified lump in the left breast, upper outer quadrant: Secondary | ICD-10-CM | POA: Diagnosis not present

## 2018-03-16 DIAGNOSIS — N6324 Unspecified lump in the left breast, lower inner quadrant: Secondary | ICD-10-CM | POA: Diagnosis not present

## 2018-03-16 DIAGNOSIS — N6323 Unspecified lump in the left breast, lower outer quadrant: Secondary | ICD-10-CM | POA: Diagnosis not present

## 2018-03-16 LAB — CBC
ALT: 20
AST (SGOT): 16
BASOS ABS: 0.04
BASOS PCT: 0.6
Eosinophil #: 0.07
Eosinophil %: 1.1
LYMPHOCYTES RELATIVE % (KUC): 29.9 % (ref 15–45)
MCH: 28.7
MCHC: 33.3
MCV: 86.3
MONO#: 0.43
MONO%: 7 %
MPV (KUC): 12 fL — AB (ref 7.8–11)
Neutrophil #: 4.05
Neutrophil %: 61.8
RBC: 4.74
RDW-CV: 13.8
RDW-SD: 43.7
lymph#: 1.96
platelet count: 121

## 2018-03-16 LAB — HM MAMMOGRAPHY

## 2018-03-22 ENCOUNTER — Encounter: Payer: Self-pay | Admitting: Internal Medicine

## 2018-03-23 ENCOUNTER — Other Ambulatory Visit: Payer: Self-pay | Admitting: Internal Medicine

## 2018-03-23 ENCOUNTER — Telehealth: Payer: Self-pay

## 2018-03-23 DIAGNOSIS — H93A3 Pulsatile tinnitus, bilateral: Secondary | ICD-10-CM | POA: Diagnosis not present

## 2018-03-23 DIAGNOSIS — M26629 Arthralgia of temporomandibular joint, unspecified side: Secondary | ICD-10-CM | POA: Insufficient documentation

## 2018-03-23 DIAGNOSIS — R51 Headache: Secondary | ICD-10-CM | POA: Diagnosis not present

## 2018-03-23 DIAGNOSIS — B372 Candidiasis of skin and nail: Secondary | ICD-10-CM | POA: Diagnosis not present

## 2018-03-23 NOTE — Telephone Encounter (Signed)
Patient is worried about the burning in her throat and the fact that it could be thrush/canditats and was wondering if there is any tests that could be done in our office or is this something that has to be taken care of at GI. States that she has an appointment with Dr. Collene Mares mid April but does not want to have to do an endoscopy each time she has this issue. States currently taking Carafate and that used to help but is not working now

## 2018-03-23 NOTE — Telephone Encounter (Signed)
Copied from [place] [id number]. Topic: Inquiry >> Mar 22, 2018  9:07 AM Conception Chancy, NT wrote: Patient is calling and states she was supposed to get a call back from [place] in regards to blood work and a bone density scan. Please contact pt.

## 2018-03-24 MED ORDER — FLUCONAZOLE 150 MG PO TABS
150.0000 mg | ORAL_TABLET | Freq: Once | ORAL | 0 refills | Status: AC
Start: 2018-03-24 — End: 2018-03-24

## 2018-03-24 NOTE — Telephone Encounter (Signed)
Patient informed. 

## 2018-03-24 NOTE — Telephone Encounter (Signed)
Have sent in diflucan which is a pill she takes once. This will treat thrush if she has it. It is not likely that this is causing the symptoms. If no improvement should make visit with [place] or Dr. Collene Mares

## 2018-03-27 LAB — LIPID PANEL
Cholesterol: 189 (ref 0–200)
HDL: 49 (ref 35–70)
LDL Cholesterol: 119
Triglycerides: 103 (ref 40–160)

## 2018-03-27 LAB — HM DEXA SCAN: HM Dexa Scan: -0.5

## 2018-03-27 LAB — VITAMIN B12: VITAMIN B 12: 363

## 2018-03-27 LAB — BASIC METABOLIC PANEL: Creatinine: 1 (ref 0.5–1.1)

## 2018-03-29 DIAGNOSIS — K208 Other esophagitis: Secondary | ICD-10-CM | POA: Diagnosis not present

## 2018-03-29 DIAGNOSIS — R1310 Dysphagia, unspecified: Secondary | ICD-10-CM | POA: Diagnosis not present

## 2018-03-29 DIAGNOSIS — K228 Other specified diseases of esophagus: Secondary | ICD-10-CM | POA: Diagnosis not present

## 2018-03-29 DIAGNOSIS — R1013 Epigastric pain: Secondary | ICD-10-CM | POA: Diagnosis not present

## 2018-03-29 DIAGNOSIS — K219 Gastro-esophageal reflux disease without esophagitis: Secondary | ICD-10-CM | POA: Diagnosis not present

## 2018-04-13 DIAGNOSIS — K5730 Diverticulosis of large intestine without perforation or abscess without bleeding: Secondary | ICD-10-CM | POA: Diagnosis not present

## 2018-04-13 DIAGNOSIS — K219 Gastro-esophageal reflux disease without esophagitis: Secondary | ICD-10-CM | POA: Diagnosis not present

## 2018-04-13 DIAGNOSIS — Z800 Family history of malignant neoplasm of digestive organs: Secondary | ICD-10-CM | POA: Diagnosis not present

## 2018-04-21 ENCOUNTER — Encounter: Payer: Self-pay | Admitting: Internal Medicine

## 2018-04-21 ENCOUNTER — Ambulatory Visit (INDEPENDENT_AMBULATORY_CARE_PROVIDER_SITE_OTHER): Payer: PPO | Admitting: Internal Medicine

## 2018-04-21 DIAGNOSIS — K219 Gastro-esophageal reflux disease without esophagitis: Secondary | ICD-10-CM | POA: Diagnosis not present

## 2018-04-21 MED ORDER — PANTOPRAZOLE SODIUM 40 MG PO TBEC: 40.0000 mg | DELAYED_RELEASE_TABLET | Freq: Two times a day (BID) | ORAL | 3 refills | Status: DC

## 2018-04-21 NOTE — Patient Instructions (Signed)
We have sent in protonix to take 1 pill twice a day for 2 weeks.   When you get done with twice a day take 1 pill daily of protonix. If you are still having symptoms call us for advice.  It is okay to take carafate as needed for symptoms.

## 2018-04-21 NOTE — Progress Notes (Signed)
   Subjective:    Patient ID: Gabrielle [name], female    DOB: 1951/06/27, 67 y.o.   MRN: [id number]  HPI The patient is a 67 YO female coming in for sore throat. Going on for some time. Treated with diflucan as she thought it could be thrush. She has had GERD for some time and treated with carafate in the past which has helped intermittently. She had EGD which did not show a cause. She was placed on omeprazole 40 mg daily and zantac 300 mg qhs but she is still having a lot of burning all the time. This does limit her food intake at times. Declines food getting stuck. Not taking carafate now.   Review of Systems  Constitutional: Negative.   HENT: Positive for sore throat.   Eyes: Negative.   Respiratory: Negative for cough, chest tightness and shortness of breath.   Cardiovascular: Negative for chest pain, palpitations and leg swelling.  Gastrointestinal: Negative for abdominal distention, abdominal pain, constipation, diarrhea, nausea and vomiting.       Gerd  Musculoskeletal: Negative.   Skin: Negative.   Neurological: Negative.   Psychiatric/Behavioral: Negative.       Objective:   Physical Exam  Constitutional: She is oriented to person, place, and time. She appears well-developed and well-nourished.  HENT:  Head: Normocephalic and atraumatic.  Eyes: EOM are normal.  Neck: Normal range of motion.  Cardiovascular: Normal rate and regular rhythm.  Pulmonary/Chest: Effort normal and breath sounds normal. No respiratory distress. She has no wheezes. She has no rales.  Abdominal: Soft. Bowel sounds are normal. She exhibits no distension. There is no tenderness. There is no rebound.  Musculoskeletal: She exhibits no edema.  Neurological: She is alert and oriented to person, place, and time. Coordination normal.  Skin: Skin is warm and dry.  Psychiatric: She has a normal mood and affect.   Vitals:   04/21/18 1258  BP: (!) 150/90  Pulse: 69  SpO2: 96%  Weight: 234 lb (106.1 kg)    Height: 5\' 4"  (1.626 m)      Assessment & Plan:

## 2018-04-21 NOTE — Assessment & Plan Note (Signed)
Protonix 40 mg BID for 2 weeks and add back carafate to help with symptoms.

## 2018-04-24 ENCOUNTER — Encounter (HOSPITAL_COMMUNITY): Payer: Self-pay | Admitting: Emergency Medicine

## 2018-04-24 ENCOUNTER — Encounter: Payer: Self-pay | Admitting: Internal Medicine

## 2018-04-24 ENCOUNTER — Emergency Department (HOSPITAL_COMMUNITY)
Admission: EM | Admit: 2018-04-24 | Discharge: 2018-04-24 | Disposition: A | Discharge status: Home / Self Care | Payer: Non-veteran care | Attending: Emergency Medicine | Admitting: Emergency Medicine

## 2018-04-24 ENCOUNTER — Emergency Department (HOSPITAL_COMMUNITY): Payer: Non-veteran care

## 2018-04-24 DIAGNOSIS — I10 Essential (primary) hypertension: Secondary | ICD-10-CM | POA: Diagnosis not present

## 2018-04-24 DIAGNOSIS — R070 Pain in throat: Secondary | ICD-10-CM

## 2018-04-24 DIAGNOSIS — Z96651 Presence of right artificial knee joint: Secondary | ICD-10-CM | POA: Insufficient documentation

## 2018-04-24 DIAGNOSIS — R079 Chest pain, unspecified: Secondary | ICD-10-CM | POA: Diagnosis present

## 2018-04-24 DIAGNOSIS — Z79899 Other long term (current) drug therapy: Secondary | ICD-10-CM | POA: Insufficient documentation

## 2018-04-24 DIAGNOSIS — Z87891 Personal history of nicotine dependence: Secondary | ICD-10-CM | POA: Insufficient documentation

## 2018-04-24 LAB — CBC
HCT: 41.8 % (ref 36.0–46.0)
Hemoglobin: 13.6 g/dL (ref 12.0–15.0)
MCH: 28.8 pg (ref 26.0–34.0)
MCHC: 32.5 g/dL (ref 30.0–36.0)
MCV: 88.6 fL (ref 78.0–100.0)
PLATELETS: 138 10*3/uL — AB (ref 150–400)
RBC: 4.72 MIL/uL (ref 3.87–5.11)
RDW: 14.1 % (ref 11.5–15.5)
WBC: 6.3 10*3/uL (ref 4.0–10.5)

## 2018-04-24 LAB — BASIC METABOLIC PANEL
Anion gap: 8 (ref 5–15)
BUN: 20 mg/dL (ref 6–20)
CO2: 26 mmol/L (ref 22–32)
CREATININE: 0.94 mg/dL (ref 0.44–1.00)
Calcium: 9.2 mg/dL (ref 8.9–10.3)
Chloride: 106 mmol/L (ref 101–111)
GFR calc Af Amer: >60 mL/min (ref >60)
Glucose, Bld: 94 mg/dL (ref 65–99)
Potassium: 4 mmol/L (ref 3.5–5.1)
SODIUM: 140 mmol/L (ref 135–145)

## 2018-04-24 LAB — I-STAT TROPONIN, ED: Troponin i, poc: 0 ng/mL (ref 0.00–0.08)

## 2018-04-24 NOTE — ED Triage Notes (Signed)
Patient here from home with complaints of chest pain. Mid upper chest pain radiating up in throat x2 months. Saw ENT and was "scoped". States that they found a nodule that was benign. Also states that she has been on a lot of gerd medications with no relief.

## 2018-04-24 NOTE — Progress Notes (Signed)
Abstracted and sent to scan  

## 2018-04-24 NOTE — Discharge Instructions (Signed)
Continue with your prescribed regimen.  Follow-up with the gastroenterologist.  Seeking care at a tertiary center, such as [facility], [facility], or [facility] may be indicated. I found no indication that Diflucan interferes with tinnitus.

## 2018-04-24 NOTE — ED Provider Notes (Signed)
[facility] DEPT Provider Note   CSN: [id number] Arrival date & time: 04/24/18  1215     History   Chief Complaint Chief Complaint  Patient presents with  . Chest Pain    HPI TELITHA PLATH is a 67 y.o. female.  HPI   ALOMA BOCH is a 67 y.o. female, with a history of GERD, presenting to the ED with discomfort in upper chest and throat for last 3 months. "Feels like burning in the esophagus."  Moderate to severe. Feels like her GERD, however, now is more constant.  Worsens in the morning, before meals, and after eating a wide variety of foods. Patient is followed by gastroenterologist, Dr. Collene Mares.  Evaluated by Dr. Collene Mares 1 week ago and states she "had a scope done."  States a small nodule was found, this was biopsied, and was found to be benign.  Dr. Collene Mares instructed her to take 300 mg Zantac in the evening and 40 mg omeprazole in the morning. Patient then saw her PCP, Dr. Sharlet Salina, 3 days ago and was started on 40 mg Prilosec twice daily, in addition to the Zantac, omeprazole, and Carafate. A little over a week ago, patient was prescribed Diflucan by her PCP for suspicion of possible esophagitis.  She did not take this medication, however, is because her GI specialist told her she did not see any evidence of esophagitis. Denies N/V/D, hematochezia/melena, abdominal pain, shortness of breath, cough, difficulty swallowing, fever/chills, or any other complaints.   Past Medical History:  Diagnosis Date  . Anemia    as a child  . Anxiety    takes Xanax daily  . Arthritis   . Cataracts, bilateral    immature  . Chronic insomnia 10/11/2014  . Complication of anesthesia   . Constipation    will occasionally take Milk of Mag  . Diverticulosis   . Dizziness    rarely  . GERD (gastroesophageal reflux disease)    takes Omeprazole every other day  . Glaucoma    borderline and no drops required  . Hearing loss    but doesn't have hearing aids  .  History of bronchitis    many many yrs ago  . History of colitis   . History of colon polyps   . Hypertension    hasn't been on meds for the past 66yrs   . Insomnia    takes Trazodone nightly as needed and Ambien nightly   . 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 Joint pain   . Joint swelling   . PONV (postoperative nausea and vomiting)   . Tinnitus    takes HCTZ daily to decrease pressure in ears    Patient Active Problem List   Diagnosis Date Noted  . GERD (gastroesophageal reflux disease) 04/21/2018  . Chronic post-traumatic stress disorder (PTSD) 03/14/2018  . BV (bacterial vaginosis) 03/14/2018  . Tinnitus 03/14/2018  . Routine general medical examination at a health care facility 03/14/2018  . Abnormality of gait 06/03/2015  . S/P TKR (total knee replacement) 06/03/2015  . Chronic left SI joint pain 06/03/2015  . Chronic insomnia 10/11/2014  . DJD (degenerative joint disease) of knee 03/13/2014    Past Surgical History:  Procedure Laterality Date  . ABDOMINAL HYSTERECTOMY    . APPENDECTOMY    . COLONOSCOPY    . IR RADIOLOGIST EVAL & MGMT  06/08/2017  . tinnitus Right   . TONSILLECTOMY    . TOTAL KNEE ARTHROPLASTY Right 03/13/2014  DR Percell Miller  . TOTAL KNEE ARTHROPLASTY Right 03/13/2014   Procedure: TOTAL KNEE ARTHROPLASTY;  Surgeon: [name], MD;  Location: [facility];  Service: Orthopedics;  Laterality: Right;  . TUBAL LIGATION       OB History   None      Home Medications    Prior to Admission medications   Medication Sig Start Date End Date Taking? Authorizing Provider  ALPRAZolam Duanne Moron) 1 MG tablet Take 1 mg by mouth 2 (two) times daily.    [provider]  cholecalciferol (VITAMIN D) 1000 UNITS tablet Take 2,000 Units by mouth daily. Reported on 06/01/2016    [provider]  ibuprofen (ADVIL,MOTRIN) 600 MG tablet Take 1 tablet (600 mg total) by mouth every 6 (six) hours as needed. 06/27/17   Nona Dell, PA-C    metroNIDAZOLE (FLAGYL) 500 MG tablet Take 1 tablet (500 mg total) by mouth 3 (three) times daily. 03/14/18   [name], MD  metroNIDAZOLE (METROGEL VAGINAL) 0.75 % vaginal gel Place 1 Applicatorful vaginally 2 (two) times daily. 03/15/18   [name], MD  omeprazole (PRILOSEC) 40 MG capsule TAKE ONE CAPSULE BY MOUTH 20 MINUTES BEFORE BREAKFAST 01/13/18   [provider]  pantoprazole (PROTONIX) 40 MG tablet Take 1 tablet (40 mg total) by mouth 2 (two) times daily before a meal. 04/21/18   [name], MD  polyethylene glycol [facility]) packet Take 17 g by mouth daily as needed.     [provider]  Sucralfate (CARAFATE PO) Carafate    [provider]  zolpidem (AMBIEN) 10 MG tablet Take 10 mg by mouth at bedtime as needed for sleep. May take 1 and 1/2 tab = 15 mg    [provider]    Family History Family History  Problem Relation Age of Onset  . Cancer - Other Father   . Kidney disease Father   . Cancer - Other Sister        breast ca  . Cancer - Other Brother        lymphoma in remission  . Cancer - Other Sister        in remission breast ca    Social History Social History   Tobacco Use  . Smoking status: Former Smoker    Packs/day: 1.00    Years: 22.00    Pack years: 22.00    Types: Cigarettes    Start date: 03/08/1972    Last attempt to quit: 01/08/1994    Years since quitting: 24.3  . Smokeless tobacco: Never Used  . Tobacco comment: quit smoking 38yrs ago  Substance Use Topics  . Alcohol use: No    Alcohol/week: 0.0 oz  . Drug use: No     Allergies   Contrast media [iodinated diagnostic agents] and Hydrocodone   Review of Systems Review of Systems  Constitutional: Negative for chills, diaphoresis and fever.  HENT: Negative for trouble swallowing and voice change.   Respiratory: Negative for cough and shortness of breath.   Cardiovascular: Negative for chest pain.  Gastrointestinal: Negative  for abdominal pain, constipation, diarrhea, nausea and vomiting.       Burning in the throat  Neurological: Negative for dizziness and light-headedness.  All other systems reviewed and are negative.    Physical Exam Updated Vital Signs BP (!) 157/89 (BP Location: Left Arm)   Pulse 65   Temp 98 F (36.7 C) (Oral)   Resp 15   SpO2 99%  Physical Exam  Constitutional: She appears well-developed and well-nourished. No distress.  HENT:  Head: Normocephalic and atraumatic.  Eyes: Conjunctivae are normal.  Neck: Neck supple.  Cardiovascular: Normal rate, regular rhythm, normal heart sounds and intact distal pulses.  Pulmonary/Chest: Effort normal and breath sounds normal. No respiratory distress.  Abdominal: Soft. There is no tenderness. There is no guarding.  Musculoskeletal: She exhibits no edema.  Lymphadenopathy:    She has no cervical adenopathy.  Neurological: She is alert.  Skin: Skin is warm and dry. She is not diaphoretic.  Psychiatric: She has a normal mood and affect. Her behavior is normal.  Nursing note and vitals reviewed.    ED Treatments / Results  Labs (all labs ordered are listed, but only abnormal results are displayed) Labs Reviewed  CBC - Abnormal; Notable for the following components:      Result Value   Platelets 138 (*)    All other components within normal limits  BASIC METABOLIC PANEL  I-STAT TROPONIN, ED    EKG EKG Interpretation  Date/Time:  Monday April 24 2018 12:22:08 EDT Ventricular Rate:  68 PR Interval:  160 QRS Duration: 82 QT Interval:  408 QTC Calculation: 433 R Axis:   26 Text Interpretation:  Normal sinus rhythm Normal ECG No significant change since last tracing Confirmed by Alfonzo Beers [phone]) on 04/25/2018 12:00:41 AM   Radiology Dg Chest 2 View  Result Date: 04/24/2018 CLINICAL DATA:  67 y/o F; 1 week of chest pain with burning in her throat. EXAM: CHEST - 2 VIEW COMPARISON:  06/27/2017 chest radiograph FINDINGS:  Normal cardiac silhouette. Aortic atherosclerosis with calcification. Clear lungs. No pleural effusion or pneumothorax. No acute osseous abnormality is evident. IMPRESSION: No active cardiopulmonary disease. Electronically Signed   By: Kristine Garbe M.D.   On: 04/24/2018 14:45    Procedures Procedures (including critical care time)  Medications Ordered in ED Medications - No data to display   Initial Impression / Assessment and Plan / ED Course  I have reviewed the triage vital signs and the nursing notes.  Pertinent labs & imaging results that were available during my care of the patient were reviewed by me and considered in my medical decision making (see chart for details).     Patient presents with burning in the back of the throat for the last 3 months.  She has had regular evaluations by her gastroenterologist and her PCP on this matter.  Patient is nontoxic appearing, afebrile, not tachycardic, not tachypneic, not hypotensive, maintains excellent SPO2 on room air, and is in no apparent distress.  Lab results reassuring.  No acute abnormality on chest x-ray.  Patient was offered GI cocktail and/or narcotic analgesics, but declined. Recommend GI follow-up, possibly at a tertiary center.  Patient also encouraged to take the Diflucan.  Return precautions discussed.  Findings and plan of care discussed with Townsend Roger, MD.   Vitals:   04/24/18 1220 04/24/18 1713 04/24/18 2002 04/24/18 2130  BP: 128/74 (!) 171/85 (!) 157/89 (!) 145/65  Pulse: 67 62 65 62  Resp: 15 18 15 16   Temp: 98.1 F (36.7 C) 98 F (36.7 C)    TempSrc: Oral Oral    SpO2: 97% 98% 99% 99%     Final Clinical Impressions(s) / ED Diagnoses   Final diagnoses:  Throat pain    ED Discharge Orders    None       Layla Maw 04/25/18 0002    Mabe, Forbes Cellar, MD  04/25/18 0004  

## 2018-05-01 ENCOUNTER — Telehealth: Payer: Self-pay

## 2018-05-01 DIAGNOSIS — K1379 Other lesions of oral mucosa: Secondary | ICD-10-CM

## 2018-05-01 NOTE — Telephone Encounter (Signed)
Patient states she is taking her medications and there is no relief. It burning and hurting so bad causing her not to be able to eat and she has lost 10 pounds from not being able to eat. Scared that whatever she eats will cause the burning, not having problems swallowing just a torch feeling at the base of her tongue at her throat. States that she called the place that did the endoscopy and stated she just felt they did not want to go any further with her or help. Patient is wanting to know if there is any type of testing or anything that can be done. States she was in the ED for these symptoms and the tests they did came back with nothing wrong. Is just wanting help she is desperate. Patient was informed Dr. Sharlet Salina is out of the office till Wednesday, patient states that she does not want to see another doctor or note to be sent to someone else.

## 2018-05-01 NOTE — Telephone Encounter (Signed)
Copied from [facility] [id number]. Topic: General - Other >> May 01, 2018  9:23 AM Carolyn Stare wrote:  Pt call and ask that Dr Sharlet Salina nurse call her she did not want to tell me the reason for the call back   [phone]

## 2018-05-02 DIAGNOSIS — E785 Hyperlipidemia, unspecified: Secondary | ICD-10-CM | POA: Diagnosis not present

## 2018-05-02 DIAGNOSIS — H9319 Tinnitus, unspecified ear: Secondary | ICD-10-CM | POA: Diagnosis not present

## 2018-05-02 DIAGNOSIS — F411 Generalized anxiety disorder: Secondary | ICD-10-CM | POA: Diagnosis not present

## 2018-05-02 DIAGNOSIS — D696 Thrombocytopenia, unspecified: Secondary | ICD-10-CM | POA: Diagnosis not present

## 2018-05-02 DIAGNOSIS — Z0000 Encounter for general adult medical examination without abnormal findings: Secondary | ICD-10-CM | POA: Diagnosis not present

## 2018-05-02 DIAGNOSIS — I10 Essential (primary) hypertension: Secondary | ICD-10-CM | POA: Diagnosis not present

## 2018-05-02 DIAGNOSIS — M545 Low back pain: Secondary | ICD-10-CM | POA: Diagnosis not present

## 2018-05-03 MED ORDER — LIDOCAINE VISCOUS 2 % MT SOLN: 20.0000 mL | OROMUCOSAL | 0 refills | Status: DC | PRN

## 2018-05-03 NOTE — Telephone Encounter (Signed)
Have sent in lidocaine liquid to try for pain to see if this helps. Can use 20-30 minutes prior to meals or eating. We have placed urgent referral to ENT which may be able to help.

## 2018-05-03 NOTE — Telephone Encounter (Signed)
FYI Patient informed of MD response and just wanted [place] to know that she may or may not do the lidocaine because she has had it before but she will go to the ENT and hopefully they can help. States that she just feels blown off by her GI doctor and has wanted [place] to know she called about some trial thing she saw on TV where they do an endoscopy and they take a picture of the insides. States they were not wanting to do another endoscopy since she had one not to long ago. Patient just wanted [place] to be aware of where she is at with everything

## 2018-05-17 DIAGNOSIS — M1712 Unilateral primary osteoarthritis, left knee: Secondary | ICD-10-CM | POA: Diagnosis not present

## 2018-06-15 DIAGNOSIS — H9313 Tinnitus, bilateral: Secondary | ICD-10-CM | POA: Diagnosis not present

## 2018-06-15 DIAGNOSIS — H9113 Presbycusis, bilateral: Secondary | ICD-10-CM | POA: Diagnosis not present

## 2018-07-05 ENCOUNTER — Encounter: Payer: Self-pay | Admitting: Internal Medicine

## 2018-07-05 NOTE — Progress Notes (Signed)
Abstracted and sent to scan  

## 2018-07-06 ENCOUNTER — Other Ambulatory Visit (HOSPITAL_COMMUNITY): Payer: Self-pay | Admitting: Ophthalmology

## 2018-07-06 ENCOUNTER — Ambulatory Visit (HOSPITAL_COMMUNITY)
Admission: RE | Admit: 2018-07-06 | Discharge: 2018-07-06 | Disposition: A | Discharge status: Home / Self Care | Payer: PPO | Source: Ambulatory Visit | Attending: Ophthalmology | Admitting: Ophthalmology

## 2018-07-06 DIAGNOSIS — H43813 Vitreous degeneration, bilateral: Secondary | ICD-10-CM | POA: Diagnosis not present

## 2018-07-06 DIAGNOSIS — H7013 Chronic mastoiditis, bilateral: Secondary | ICD-10-CM | POA: Diagnosis not present

## 2018-07-06 DIAGNOSIS — M7989 Other specified soft tissue disorders: Secondary | ICD-10-CM | POA: Insufficient documentation

## 2018-07-06 DIAGNOSIS — H547 Unspecified visual loss: Secondary | ICD-10-CM | POA: Diagnosis not present

## 2018-07-06 DIAGNOSIS — H35033 Hypertensive retinopathy, bilateral: Secondary | ICD-10-CM | POA: Diagnosis not present

## 2018-07-13 DIAGNOSIS — H47012 Ischemic optic neuropathy, left eye: Secondary | ICD-10-CM | POA: Diagnosis not present

## 2018-07-13 DIAGNOSIS — H35033 Hypertensive retinopathy, bilateral: Secondary | ICD-10-CM | POA: Diagnosis not present

## 2018-07-13 DIAGNOSIS — H547 Unspecified visual loss: Secondary | ICD-10-CM | POA: Diagnosis not present

## 2018-07-16 ENCOUNTER — Other Ambulatory Visit: Payer: Self-pay | Admitting: Internal Medicine

## 2018-07-17 DIAGNOSIS — H47012 Ischemic optic neuropathy, left eye: Secondary | ICD-10-CM | POA: Diagnosis not present

## 2018-07-24 DIAGNOSIS — H02106 Unspecified ectropion of left eye, unspecified eyelid: Secondary | ICD-10-CM | POA: Diagnosis not present

## 2018-07-24 DIAGNOSIS — H469 Unspecified optic neuritis: Secondary | ICD-10-CM | POA: Diagnosis not present

## 2018-07-24 DIAGNOSIS — H40033 Anatomical narrow angle, bilateral: Secondary | ICD-10-CM | POA: Diagnosis not present

## 2018-07-24 DIAGNOSIS — H25813 Combined forms of age-related cataract, bilateral: Secondary | ICD-10-CM | POA: Diagnosis not present

## 2018-07-28 DIAGNOSIS — H469 Unspecified optic neuritis: Secondary | ICD-10-CM | POA: Diagnosis not present

## 2018-08-04 DIAGNOSIS — H04123 Dry eye syndrome of bilateral lacrimal glands: Secondary | ICD-10-CM | POA: Diagnosis not present

## 2018-08-04 DIAGNOSIS — H25813 Combined forms of age-related cataract, bilateral: Secondary | ICD-10-CM | POA: Diagnosis not present

## 2018-08-04 DIAGNOSIS — H469 Unspecified optic neuritis: Secondary | ICD-10-CM | POA: Diagnosis not present

## 2018-08-12 ENCOUNTER — Emergency Department (HOSPITAL_COMMUNITY)
Admission: EM | Admit: 2018-08-12 | Discharge: 2018-08-12 | Disposition: A | Discharge status: Home / Self Care | Payer: PPO | Attending: Emergency Medicine | Admitting: Emergency Medicine

## 2018-08-12 ENCOUNTER — Emergency Department (HOSPITAL_COMMUNITY): Payer: PPO

## 2018-08-12 ENCOUNTER — Encounter (HOSPITAL_COMMUNITY): Payer: Self-pay

## 2018-08-12 ENCOUNTER — Other Ambulatory Visit: Payer: Self-pay

## 2018-08-12 DIAGNOSIS — R202 Paresthesia of skin: Secondary | ICD-10-CM | POA: Diagnosis not present

## 2018-08-12 DIAGNOSIS — Z79899 Other long term (current) drug therapy: Secondary | ICD-10-CM | POA: Diagnosis not present

## 2018-08-12 DIAGNOSIS — Z87891 Personal history of nicotine dependence: Secondary | ICD-10-CM | POA: Insufficient documentation

## 2018-08-12 DIAGNOSIS — G609 Hereditary and idiopathic neuropathy, unspecified: Secondary | ICD-10-CM | POA: Diagnosis not present

## 2018-08-12 DIAGNOSIS — H53132 Sudden visual loss, left eye: Secondary | ICD-10-CM | POA: Diagnosis not present

## 2018-08-12 DIAGNOSIS — H539 Unspecified visual disturbance: Secondary | ICD-10-CM | POA: Diagnosis not present

## 2018-08-12 DIAGNOSIS — R9431 Abnormal electrocardiogram [ECG] [EKG]: Secondary | ICD-10-CM | POA: Diagnosis not present

## 2018-08-12 DIAGNOSIS — R200 Anesthesia of skin: Secondary | ICD-10-CM | POA: Diagnosis not present

## 2018-08-12 LAB — CBC
HEMATOCRIT: 42.5 % (ref 36.0–46.0)
HEMOGLOBIN: 14.3 g/dL (ref 12.0–15.0)
MCH: 29.5 pg (ref 26.0–34.0)
MCHC: 33.6 g/dL (ref 30.0–36.0)
MCV: 87.6 fL (ref 78.0–100.0)
Platelets: 115 10*3/uL — ABNORMAL LOW (ref 150–400)
RBC: 4.85 MIL/uL (ref 3.87–5.11)
RDW: 14 % (ref 11.5–15.5)
WBC: 5.7 10*3/uL (ref 4.0–10.5)

## 2018-08-12 LAB — DIFFERENTIAL
BASOS ABS: 0 10*3/uL (ref 0.0–0.1)
BASOS PCT: 0 %
Eosinophils Absolute: 0.1 10*3/uL (ref 0.0–0.7)
Eosinophils Relative: 1 %
LYMPHS ABS: 1.9 10*3/uL (ref 0.7–4.0)
LYMPHS PCT: 34 %
Monocytes Absolute: 0.3 10*3/uL (ref 0.1–1.0)
Monocytes Relative: 6 %
NEUTROS ABS: 3.4 10*3/uL (ref 1.7–7.7)
Neutrophils Relative %: 59 %

## 2018-08-12 LAB — URINALYSIS, ROUTINE W REFLEX MICROSCOPIC
Bilirubin Urine: NEGATIVE
GLUCOSE, UA: NEGATIVE mg/dL
Ketones, ur: NEGATIVE mg/dL
Nitrite: NEGATIVE
Protein, ur: NEGATIVE mg/dL
Specific Gravity, Urine: 1.009 (ref 1.005–1.030)
pH: 6 (ref 5.0–8.0)

## 2018-08-12 LAB — COMPREHENSIVE METABOLIC PANEL
ALK PHOS: 60 U/L (ref 38–126)
ALT: 12 U/L (ref 0–44)
AST: 19 U/L (ref 15–41)
Albumin: 3.6 g/dL (ref 3.5–5.0)
Anion gap: 8 (ref 5–15)
BUN: 9 mg/dL (ref 8–23)
CO2: 25 mmol/L (ref 22–32)
CREATININE: 0.93 mg/dL (ref 0.44–1.00)
Calcium: 9.3 mg/dL (ref 8.9–10.3)
Chloride: 110 mmol/L (ref 98–111)
GFR calc Af Amer: >60 mL/min (ref >60)
Glucose, Bld: 96 mg/dL (ref 70–99)
Potassium: 3.5 mmol/L (ref 3.5–5.1)
Sodium: 143 mmol/L (ref 135–145)
Total Bilirubin: 0.5 mg/dL (ref 0.3–1.2)
Total Protein: 6.6 g/dL (ref 6.5–8.1)

## 2018-08-12 LAB — I-STAT TROPONIN, ED: TROPONIN I, POC: 0 ng/mL (ref 0.00–0.08)

## 2018-08-12 LAB — PROTIME-INR
INR: 1.01
Prothrombin Time: 13.2 seconds (ref 11.4–15.2)

## 2018-08-12 LAB — RAPID URINE DRUG SCREEN, HOSP PERFORMED
Amphetamines: NOT DETECTED
BARBITURATES: NOT DETECTED
BENZODIAZEPINES: POSITIVE — AB
Cocaine: NOT DETECTED
Tetrahydrocannabinol: NOT DETECTED

## 2018-08-12 LAB — APTT: APTT: 32 s (ref 24–36)

## 2018-08-12 MED ORDER — LORAZEPAM 2 MG/ML IJ SOLN
0.5000 mg | Freq: Once | INTRAMUSCULAR | Status: AC
  Administered 2018-08-12: 0.5 mg via INTRAVENOUS
  Filled 2018-08-12: qty 1

## 2018-08-12 MED ORDER — SODIUM CHLORIDE 0.9 % IV SOLN
100.0000 mL/h | INTRAVENOUS | Status: DC
  Administered 2018-08-12: 100 mL/h via INTRAVENOUS

## 2018-08-12 MED ORDER — SODIUM CHLORIDE 0.9 % IV BOLUS
500.0000 mL | Freq: Once | INTRAVENOUS | Status: AC
  Administered 2018-08-12: 500 mL via INTRAVENOUS

## 2018-08-12 NOTE — ED Triage Notes (Addendum)
Pt presents to ED from home for numbness in her hands and feet. 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 Pt reports that she went to urgent care today because of the new numbness in her hands, and they wanted her to go to ED to rule out stroke. Equal strength bilaterally, clear speech. Pt very anxious.

## 2018-08-12 NOTE — Discharge Instructions (Addendum)
As discussed, today's evaluation has been generally reassuring.  Given your ongoing ophthalmology issues, and your ongoing medication regimen it is important that you follow-up with both your physician and your counselor.  With either of these individuals please discuss your medication regimen, and consider adjusting your benzodiazepine dosing, possibly switching from Xanax to Ativan.  Return here for concerning changes in your condition.

## 2018-08-12 NOTE — ED Notes (Signed)
Patient transported to CT 

## 2018-08-12 NOTE — ED Provider Notes (Signed)
[facility] DEPT Provider Note   CSN: [id number] Arrival date & time: 08/12/18  4166     History   Chief Complaint Chief Complaint  Patient presents with  . Numbness    HPI Gabrielle [name] is a 67 y.o. female.  HPI  This patient presents with concern for bilateral upper and lower extremity numbness. Onset was 2 or 3 days ago, initially with lower extremity sensory change, but over the interval she has developed numbness in all 4 distal extremities. No ACE engine of the symptoms, no weakness, sensation is described as numbness. No discoordination, no falling. No clear precipitant, and since onset no clear alleviating or exacerbating factors. Patient has a notable history of recent ophthalmologic procedure, and left eye blindness about 6 weeks ago. She was in her usual state of health, with this new development of left eye blindness, side, and to the onset of this current flexion. No other recent medication change, diet change, notable social change. She is here with her sister who assists with the HPI.  She went to urgent care, and was referred to a hospital for further evaluation and management.   Past Medical History:  Diagnosis Date  . Anemia    as a child  . Anxiety    takes Xanax daily  . Arthritis   . Cataracts, bilateral    immature  . Chronic insomnia 10/11/2014  . Complication of anesthesia   . Constipation    will occasionally take Milk of Mag  . Diverticulosis   . Dizziness    rarely  . GERD (gastroesophageal reflux disease)    takes Omeprazole every other day  . Glaucoma    borderline and no drops required  . Hearing loss    but doesn't have hearing aids  . History of bronchitis    many many yrs ago  . History of colitis   . History of colon polyps   . Hypertension    hasn't been on meds for the past 66yrs   . Insomnia    takes Trazodone nightly as needed and Ambien nightly   . Insomnia due to substance 10/11/2014   . Joint pain   . Joint swelling   . PONV (postoperative nausea and vomiting)   . Tinnitus    takes HCTZ daily to decrease pressure in ears    Patient Active Problem List   Diagnosis Date Noted  . GERD (gastroesophageal reflux disease) 04/21/2018  . Chronic post-traumatic stress disorder (PTSD) 03/14/2018  . BV (bacterial vaginosis) 03/14/2018  . Tinnitus 03/14/2018  . Routine general medical examination at a health care facility 03/14/2018  . Abnormality of gait 06/03/2015  . S/P TKR (total knee replacement) 06/03/2015  . Chronic left SI joint pain 06/03/2015  . Chronic insomnia 10/11/2014  . DJD (degenerative joint disease) of knee 03/13/2014    Past Surgical History:  Procedure Laterality Date  . ABDOMINAL HYSTERECTOMY    . APPENDECTOMY    . COLONOSCOPY    . IR RADIOLOGIST EVAL & MGMT  06/08/2017  . tinnitus Right   . TONSILLECTOMY    . TOTAL KNEE ARTHROPLASTY Right 03/13/2014   DR MURPHY  . TOTAL KNEE ARTHROPLASTY Right 03/13/2014   Procedure: TOTAL KNEE ARTHROPLASTY;  Surgeon: [name], MD;  Location: [facility];  Service: Orthopedics;  Laterality: Right;  . TUBAL LIGATION       OB History   None      Home Medications    Prior to Admission  medications   Medication Sig Start Date End Date Taking? Authorizing Provider  ALPRAZolam Duanne Moron) 1 MG tablet Take 1 mg by mouth 2 (two) times daily.    [provider]  cholecalciferol (VITAMIN D) 1000 UNITS tablet Take 2,000 Units by mouth daily. Reported on 06/01/2016    [provider]  ibuprofen (ADVIL,MOTRIN) 600 MG tablet Take 1 tablet (600 mg total) by mouth every 6 (six) hours as needed. 06/27/17   Nona Dell, PA-C  lidocaine (XYLOCAINE) 2 % solution Use as directed 20 mLs in the mouth or throat as needed for mouth pain. 05/03/18   [name], MD  metroNIDAZOLE (FLAGYL) 500 MG tablet Take 1 tablet (500 mg total) by mouth 3 (three) times daily. 03/14/18   [name], MD    metroNIDAZOLE (METROGEL VAGINAL) 0.75 % vaginal gel Place 1 Applicatorful vaginally 2 (two) times daily. 03/15/18   [name], MD  omeprazole (PRILOSEC) 40 MG capsule TAKE ONE CAPSULE BY MOUTH 20 MINUTES BEFORE BREAKFAST 01/13/18   [provider]  pantoprazole (PROTONIX) 40 MG tablet Take 1 tablet (40 mg total) by mouth 2 (two) times daily before a meal. Follow-up appt is due in Sept must see provider for future refills 07/17/18   [name], MD  polyethylene glycol [facility]) packet Take 17 g by mouth daily as needed.     [provider]  Sucralfate (CARAFATE PO) Carafate    [provider]  zolpidem (AMBIEN) 10 MG tablet Take 10 mg by mouth at bedtime as needed for sleep. May take 1 and 1/2 tab = 15 mg    [provider]    Family History Family History  Problem Relation Age of Onset  . Cancer - Other Father   . Kidney disease Father   . Cancer - Other Sister        breast ca  . Cancer - Other Brother        lymphoma in remission  . Cancer - Other Sister        in remission breast ca    Social History Social History   Tobacco Use  . Smoking status: Former Smoker    Packs/day: 1.00    Years: 22.00    Pack years: 22.00    Types: Cigarettes    Start date: 03/08/1972    Last attempt to quit: 01/08/1994    Years since quitting: 24.6  . Smokeless tobacco: Never Used  . Tobacco comment: quit smoking 64yrs ago  Substance Use Topics  . Alcohol use: No    Alcohol/week: 0.0 standard drinks  . Drug use: No     Allergies   Contrast media [iodinated diagnostic agents] and Hydrocodone   Review of Systems Review of Systems  Constitutional:       Per HPI, otherwise negative  HENT:       Per HPI, otherwise negative  Eyes: Positive for visual disturbance.  Respiratory:       Per HPI, otherwise negative  Cardiovascular:       Per HPI, otherwise negative  Gastrointestinal: Negative for vomiting.  Endocrine:       Negative  aside from HPI  Genitourinary:       Neg aside from HPI   Musculoskeletal:       Per HPI, otherwise negative  Skin: Negative.   Neurological: Positive for numbness.  Psychiatric/Behavioral: The patient is nervous/anxious.      Physical Exam Updated Vital Signs BP (!) 160/75 (BP  Location: Left Arm) Comment: Simultaneous filing. User may not have seen previous data.  Pulse (!) 59 Comment: Simultaneous filing. User may not have seen previous data.  Temp 97.9 F (36.6 C)   Resp 20   Ht 5\' 4"  (1.626 m)   Wt 100.2 kg   SpO2 100% Comment: Simultaneous filing. User may not have seen previous data.  BMI 37.93 kg/m   Physical Exam  Constitutional: She is oriented to person, place, and time. She appears well-developed and well-nourished. No distress.  HENT:  Head: Normocephalic and atraumatic.  Eyes: Conjunctivae and EOM are normal.  Cardiovascular: Normal rate and regular rhythm.  Pulmonary/Chest: Effort normal and breath sounds normal. No stridor. No respiratory distress.  Abdominal: She exhibits no distension.  Musculoskeletal: She exhibits no edema.  Neurological: She is alert and oriented to person, place, and time. She displays no atrophy and no tremor. A cranial nerve deficit is present. She exhibits normal muscle tone. She displays no seizure activity. Coordination normal.  L eye blindness, otherwise CN intact  Skin: Skin is warm and dry.  Psychiatric: She has a normal mood and affect.  Nursing note and vitals reviewed.    ED Treatments / Results  Labs (all labs ordered are listed, but only abnormal results are displayed) Labs Reviewed  CBC - Abnormal; Notable for the following components:      Result Value   Platelets 115 (*)    All other components within normal limits  RAPID URINE DRUG SCREEN, HOSP PERFORMED - Abnormal; Notable for the following components:   Opiates   (*)    Value: Result not available. Reagent lot number recalled by manufacturer.    Benzodiazepines POSITIVE (*)    All other components within normal limits  URINALYSIS, ROUTINE W REFLEX MICROSCOPIC - Abnormal; Notable for the following components:   Color, Urine AMBER (*)    Hgb urine dipstick SMALL (*)    Leukocytes, UA LARGE (*)    Bacteria, UA RARE (*)    All other components within normal limits  PROTIME-INR  APTT  DIFFERENTIAL  COMPREHENSIVE METABOLIC PANEL  I-STAT TROPONIN, ED    EKG EKG Interpretation  Date/Time:  Saturday August 12 2018 19:41:26 EDT Ventricular Rate:  58 PR Interval:    QRS Duration: 90 QT Interval:  451 QTC Calculation: 443 R Axis:   -5 Text Interpretation:  Sinus rhythm Abnormal R-wave progression, early transition Baseline wander Abnormal ekg Confirmed by Carmin Muskrat [phone]) on 08/12/2018 9:32:43 PM   Radiology Ct Head Wo Contrast  Result Date: 08/12/2018 CLINICAL DATA:  67 y/o F; numbness in hands and feet. Eye surgery in July. Blindness in the left eye. New numbness in the hands. EXAM: CT HEAD WITHOUT CONTRAST TECHNIQUE: Contiguous axial images were obtained from the base of the skull through the vertex without intravenous contrast. COMPARISON:  07/06/2018 CT orbits. FINDINGS: Brain: No evidence of acute infarction, hemorrhage, hydrocephalus, extra-axial collection or mass lesion/mass effect. Vascular: Mild calcific atherosclerosis of the carotid siphons. No hyperdense vessel identified. Skull: Normal. Negative for fracture or focal lesion. Sinuses/Orbits: No acute finding. Sclerosis of the mastoid tips compatible sequelae of chronic otomastoiditis. Other: None. IMPRESSION: No acute intracranial abnormality identified. Unremarkable CT of the head. Electronically Signed   By: Kristine Garbe M.D.   On: 08/12/2018 20:48    Procedures Procedures (including critical care time)  Medications Ordered in ED Medications  sodium chloride 0.9 % bolus 500 mL (0 mLs Intravenous Stopped 08/12/18 2232)    Followed by  0.9 %   sodium chloride infusion (100 mL/hr Intravenous New Bag/Given 08/12/18 2201)  LORazepam (ATIVAN) injection 0.5 mg (0.5 mg Intravenous Given 08/12/18 2110)     Initial Impression / Assessment and Plan / ED Course  I have reviewed the triage vital signs and the nursing notes.  Pertinent labs & imaging results that were available during my care of the patient were reviewed by me and considered in my medical decision making (see chart for details).    In no distress, now accompanied by her daughter, as well as her sister. Numbness is resolved following provision of Ativan.  11:11 PM Patient awake and alert, still no numbness. With family with a very lengthy conversation about the patient's numbness, her ongoing visual difficulties, anxiety, current Xanax regimen. Advocated for changing her medications, to address her numbness, with consideration of long-acting benzodiazepine, but patient has ready to obtain her prescriptions for the coming month, has no access to her counselor for some time. Here there is no evidence for new stroke, no evidence for infection, no evidence for distress, and symptoms have resolved. Physical exam is further reassuring, with normalities of sensation, proprioception or motor function. With these reassuring findings, patient is appropriate for further evaluation, management as an outpatient. I advocated for consideration of her medications as an outpatient with either her counselor or her primary care physician.   Final Clinical Impressions(s) / ED Diagnoses  Numbness   Carmin Muskrat, MD 08/12/18 2313

## 2018-08-14 DIAGNOSIS — I10 Essential (primary) hypertension: Secondary | ICD-10-CM | POA: Diagnosis not present

## 2018-08-14 DIAGNOSIS — E785 Hyperlipidemia, unspecified: Secondary | ICD-10-CM | POA: Diagnosis not present

## 2018-08-14 DIAGNOSIS — D696 Thrombocytopenia, unspecified: Secondary | ICD-10-CM | POA: Diagnosis not present

## 2018-08-14 DIAGNOSIS — F411 Generalized anxiety disorder: Secondary | ICD-10-CM | POA: Diagnosis not present

## 2018-08-14 DIAGNOSIS — H9319 Tinnitus, unspecified ear: Secondary | ICD-10-CM | POA: Diagnosis not present

## 2018-08-14 DIAGNOSIS — Z0000 Encounter for general adult medical examination without abnormal findings: Secondary | ICD-10-CM | POA: Diagnosis not present

## 2018-08-14 DIAGNOSIS — M545 Low back pain: Secondary | ICD-10-CM | POA: Diagnosis not present

## 2018-08-15 ENCOUNTER — Other Ambulatory Visit: Payer: Self-pay

## 2018-08-15 NOTE — Patient Outreach (Signed)
[facility]) Care Management  08/15/2018  Gabrielle [name] [birth year] [id number]   Telephone Screen  Referral Date: 08/15/18 Referral Source: [facility] Dept. Referral Reason: " member expressed concerns with medication co pay, HH and transportation assistance, member required to take Carafate 3-4x/wk but can't because co pay too expensive, member would like to know if she qualifies for [facility] assistance" Insurance: [facility]    Outreach attempt # 1 to patient. Spoke with patient.   Social: Patient resides in her home alone. She reports that she is fairly independent with ADLs/IADLs but doesn't like to take showers alone for fear of falling .She reports that she currently can not strive due to recent procedure. She has been relying on her sister to take to appts. Patient voices that she has heard of and is familiar with [employer] and other transportation services that "carry a van load of people and keep them waiting." She is adamant that she does not want any of these type of transportation resources. Patient voices that the person she spoke with at [facility]([place]) told her she was placing a referral for someone to come into the home to help her with personal care services. She states she was under the impression that "we[facility]" would be doing that. Encouraged patient to discuss possible HH options with PCP as MD order needed. Patient reported that she saw PCP earlier and does not want to make an appt to get [facility] ordered. Advised patient that we do not provide those services. Offered SW referral to patient to discuss in home support care and options. Patient initially reluctant as she does not want to have to pay toof pocket for any services but then agreed to referral.  Conditions: Per chart review, patient has PMH of anemia, arthritis, insomnia, GERD, glaucoma, hearing loss, HTN and DJD.Patient did not wish to discuss and review her conditions and complete full screening.   Medications: Patient  states that she is taking about five meds. She becomes frustrated and states that she is "tired of talking to all these people." She voices she has talked to so many people about so many things that she is getting annoyed. She reports that her two expensive meds are Carafate and Ativan. She states she has spoken with someone regarding getting meds switched to a lower tier but does not recall whom she spoke with or the outcome. Patient became frustrated while RN CM was trying to gather more info and decided to end call early.   Appointments: Patient voiced she has appt today but did not disclose with whom. She states that she saw her PCP earlier this year. She saw Dr. Deterding(renal MD) on yesterday.    Advance Directives: Unable to assess. Patient ended call early.    Consent: [facility] services reviewed and discussed with patient. Patient agreeable to SW and pharmacy assistance only.She then ended call abruptly and said she was tired of talking on the phone.    Plan: RN CM will send [facility] SW referral for possible in home support/assistance resources.  RN CM will send [facility] pharmacy referral for possible med assistance.    Enzo Montgomery, RN,BSN,CCM [place] Management Telephonic Care Management Coordinator Direct Phone: [phone] Toll Free: [phone] Fax: [phone]

## 2018-08-16 ENCOUNTER — Other Ambulatory Visit: Payer: Self-pay | Admitting: Pharmacist

## 2018-08-16 DIAGNOSIS — R0789 Other chest pain: Secondary | ICD-10-CM | POA: Diagnosis not present

## 2018-08-16 DIAGNOSIS — R450 Nervousness: Secondary | ICD-10-CM | POA: Diagnosis not present

## 2018-08-16 DIAGNOSIS — J80 Acute respiratory distress syndrome: Secondary | ICD-10-CM | POA: Diagnosis not present

## 2018-08-16 DIAGNOSIS — R079 Chest pain, unspecified: Secondary | ICD-10-CM | POA: Diagnosis not present

## 2018-08-16 DIAGNOSIS — R000 Tachycardia, unspecified: Secondary | ICD-10-CM | POA: Diagnosis not present

## 2018-08-16 NOTE — Patient Outreach (Signed)
[place] [facility]) Care Management  08/16/2018  Gabrielle [name] 12/30/1950 [id number]   67 year old female referred to [place] team for medication assistance by [employer].    Per review of [facility] formulary: Carafate suspension = T3 Carafate tablets = T1 Lorazepam  = T2  RX outreach offers sucralfate 1gm, $30 for up to #120 tablets  Successful call to Gabrielle [name] today.  HIPAA identifiers verified. Patient immediately asks "why do you call me everyday?"  I reviewed with patient that this is the first outreach attempt to her and that I work with [place].  Patient agrees to discuss her medications that are expensive but declines a full medication review.  She reports she saw her PCP yesterday and that "you don't need to worry about lorazepam."  She states she can afford the lorazepam.  She reports she has tried sucralfate tablets but these make her nausea therefore she prefers to use the suspension which is more expensive.   I reviewed ways to reduce nausea with sucralfate tablets and that sucralfate tablets can be broken and dissolved in water.  Patient reports that she will not take the tablets at all however and will only use suspension.  I reviewed there are no patient assistance programs available for the suspension and that she may need to discuss this with her provider in case an alternative medication substitution is necessary.  Patient voiced understanding.  She declines any other medication questions or concerns.    Plan: I will close [facility] pharmacy case at this time.   Ralene Bathe, PharmD, [place] [phone]

## 2018-08-18 ENCOUNTER — Other Ambulatory Visit: Payer: Self-pay

## 2018-08-18 DIAGNOSIS — H25813 Combined forms of age-related cataract, bilateral: Secondary | ICD-10-CM | POA: Diagnosis not present

## 2018-08-18 DIAGNOSIS — H04123 Dry eye syndrome of bilateral lacrimal glands: Secondary | ICD-10-CM | POA: Diagnosis not present

## 2018-08-18 DIAGNOSIS — H469 Unspecified optic neuritis: Secondary | ICD-10-CM | POA: Diagnosis not present

## 2018-08-18 NOTE — Patient Outreach (Signed)
[place]) Care Management  08/18/2018  TIMA CURET 09-27-1951 [id number]  Successful outreach to the patient on today's date, HIPAA identifiers confirmed. BSW introduced self and the reason for today's call. The patient stated "I'm going to talk fast because I am waiting on a call". The patient confirms she is interested in a caregiver to provide assistnace with light housekeeping and transportation. The patient denies Medicaid eligibility. BSW educated the patient on the option for private pay caregivers. BSW to mail the patient a list of providers within [facility] for the patients review.   BSW also attempted to discuss the patients need for transportation resources. The patient stated "I am not using [employer] because I am not waiting an hour to be picked up". BSW explained that there are other options however they are limited due to being volunteer based. The patient is agreeable to have this BSW mail a list of transportation options for seniors in [place].  BSW will outreach the patient in the next month to confirm receipt of mailings. BSW will plan a case closure during the next call.  Daneen Schick, BSW, CDP [facility] [phone]

## 2018-09-05 DIAGNOSIS — H4020X2 Unspecified primary angle-closure glaucoma, moderate stage: Secondary | ICD-10-CM | POA: Diagnosis not present

## 2018-09-05 DIAGNOSIS — H47013 Ischemic optic neuropathy, bilateral: Secondary | ICD-10-CM | POA: Diagnosis not present

## 2018-09-05 DIAGNOSIS — H40032 Anatomical narrow angle, left eye: Secondary | ICD-10-CM | POA: Diagnosis not present

## 2018-09-05 DIAGNOSIS — H10413 Chronic giant papillary conjunctivitis, bilateral: Secondary | ICD-10-CM | POA: Diagnosis not present

## 2018-09-08 ENCOUNTER — Telehealth: Payer: Self-pay | Admitting: Internal Medicine

## 2018-09-08 NOTE — Telephone Encounter (Signed)
Copied from [place] [phone]. Topic: Quick Communication - See Telephone Encounter >> Sep 08, 2018 11:47 AM Reyne Dumas L wrote: CRM for notification. See Telephone encounter for: 09/08/18.  Gabrielle [name] with [employer] calling in.  States pt has reached out to insurance because she would like home health services.  Home Health [facility] to pt is [facility] and she needs it for Home Aid - just had surgery and needs basis health with showering, wound care.   Lenna Sciara G can be reached at [phone] [phone], reference number [phone]

## 2018-09-09 ENCOUNTER — Emergency Department (HOSPITAL_COMMUNITY)
Admission: EM | Admit: 2018-09-09 | Discharge: 2018-09-09 | Disposition: A | Discharge status: Home / Self Care | Payer: PPO | Attending: Emergency Medicine | Admitting: Emergency Medicine

## 2018-09-09 ENCOUNTER — Encounter (HOSPITAL_COMMUNITY): Payer: Self-pay

## 2018-09-09 ENCOUNTER — Emergency Department (HOSPITAL_COMMUNITY): Payer: PPO

## 2018-09-09 ENCOUNTER — Other Ambulatory Visit: Payer: Self-pay

## 2018-09-09 DIAGNOSIS — I10 Essential (primary) hypertension: Secondary | ICD-10-CM | POA: Insufficient documentation

## 2018-09-09 DIAGNOSIS — Z87891 Personal history of nicotine dependence: Secondary | ICD-10-CM | POA: Insufficient documentation

## 2018-09-09 DIAGNOSIS — R110 Nausea: Secondary | ICD-10-CM | POA: Diagnosis not present

## 2018-09-09 DIAGNOSIS — Z79899 Other long term (current) drug therapy: Secondary | ICD-10-CM | POA: Diagnosis not present

## 2018-09-09 DIAGNOSIS — H40033 Anatomical narrow angle, bilateral: Secondary | ICD-10-CM | POA: Diagnosis not present

## 2018-09-09 DIAGNOSIS — H47012 Ischemic optic neuropathy, left eye: Secondary | ICD-10-CM | POA: Diagnosis not present

## 2018-09-09 DIAGNOSIS — H25813 Combined forms of age-related cataract, bilateral: Secondary | ICD-10-CM | POA: Diagnosis not present

## 2018-09-09 DIAGNOSIS — R42 Dizziness and giddiness: Secondary | ICD-10-CM | POA: Diagnosis not present

## 2018-09-09 DIAGNOSIS — H40053 Ocular hypertension, bilateral: Secondary | ICD-10-CM | POA: Diagnosis not present

## 2018-09-09 LAB — CBC
HEMATOCRIT: 41.7 % (ref 36.0–46.0)
HEMOGLOBIN: 13.8 g/dL (ref 12.0–15.0)
MCH: 29 pg (ref 26.0–34.0)
MCHC: 33.1 g/dL (ref 30.0–36.0)
MCV: 87.6 fL (ref 78.0–100.0)
Platelets: 120 10*3/uL — ABNORMAL LOW (ref 150–400)
RBC: 4.76 MIL/uL (ref 3.87–5.11)
RDW: 13.7 % (ref 11.5–15.5)
WBC: 5.6 10*3/uL (ref 4.0–10.5)

## 2018-09-09 LAB — URINALYSIS, ROUTINE W REFLEX MICROSCOPIC
Bilirubin Urine: NEGATIVE
GLUCOSE, UA: NEGATIVE mg/dL
KETONES UR: NEGATIVE mg/dL
Nitrite: NEGATIVE
PROTEIN: NEGATIVE mg/dL
Specific Gravity, Urine: 1.012 (ref 1.005–1.030)
pH: 7 (ref 5.0–8.0)

## 2018-09-09 LAB — COMPREHENSIVE METABOLIC PANEL
ALBUMIN: 3.8 g/dL (ref 3.5–5.0)
ALT: 12 U/L (ref 0–44)
ANION GAP: 9 (ref 5–15)
AST: 18 U/L (ref 15–41)
Alkaline Phosphatase: 65 U/L (ref 38–126)
BUN: 15 mg/dL (ref 8–23)
CALCIUM: 9.5 mg/dL (ref 8.9–10.3)
CHLORIDE: 108 mmol/L (ref 98–111)
CO2: 28 mmol/L (ref 22–32)
Creatinine, Ser: 0.8 mg/dL (ref 0.44–1.00)
GFR calc non Af Amer: >60 mL/min (ref >60)
Glucose, Bld: 103 mg/dL — ABNORMAL HIGH (ref 70–99)
POTASSIUM: 3.5 mmol/L (ref 3.5–5.1)
SODIUM: 145 mmol/L (ref 135–145)
Total Bilirubin: 0.8 mg/dL (ref 0.3–1.2)
Total Protein: 6.9 g/dL (ref 6.5–8.1)

## 2018-09-09 LAB — LIPASE, BLOOD: LIPASE: 39 U/L (ref 11–51)

## 2018-09-09 MED ORDER — SODIUM CHLORIDE 0.9 % IV BOLUS
1000.0000 mL | Freq: Once | INTRAVENOUS | Status: AC
  Administered 2018-09-09: 1000 mL via INTRAVENOUS

## 2018-09-09 MED ORDER — LORAZEPAM 1 MG PO TABS
1.0000 mg | ORAL_TABLET | Freq: Once | ORAL | Status: AC
  Administered 2018-09-09: 1 mg via ORAL
  Filled 2018-09-09: qty 1

## 2018-09-09 MED ORDER — LORAZEPAM 2 MG/ML IJ SOLN
INTRAMUSCULAR | Status: AC
  Administered 2018-09-09: 1 mg via INTRAVENOUS
  Filled 2018-09-09: qty 1

## 2018-09-09 MED ORDER — LORAZEPAM 2 MG/ML IJ SOLN
1.0000 mg | Freq: Once | INTRAMUSCULAR | Status: AC
  Administered 2018-09-09: 1 mg via INTRAVENOUS

## 2018-09-09 MED ORDER — MECLIZINE HCL 12.5 MG PO TABS: 12.5000 mg | ORAL_TABLET | Freq: Three times a day (TID) | ORAL | 0 refills | Status: DC | PRN

## 2018-09-09 MED ORDER — MECLIZINE HCL 25 MG PO TABS
12.5000 mg | ORAL_TABLET | Freq: Once | ORAL | Status: AC
  Administered 2018-09-09: 12.5 mg via ORAL
  Filled 2018-09-09: qty 1

## 2018-09-09 MED ORDER — ONDANSETRON 4 MG PO TBDP
4.0000 mg | ORAL_TABLET | Freq: Once | ORAL | Status: AC | PRN
  Administered 2018-09-09: 4 mg via ORAL
  Filled 2018-09-09: qty 1

## 2018-09-09 NOTE — ED Notes (Signed)
MRI at bedside

## 2018-09-09 NOTE — ED Provider Notes (Signed)
[facility] DEPT Provider Note   CSN: [id number] Arrival date & time: 09/09/18  1237     History   Chief Complaint Chief Complaint  Patient presents with  . Emesis  . Dizziness    HPI Gabrielle [name] is a 67 y.o. female with PMH/o Anemia, Anxiety, GERD, Chronic left eye vision abnormalities who presents for evaluation of dizziness and nausea that began this morning at approximately 10am when she woke up. She states that when she woke up and walked to the bathroom she felt like the "room was spinning." She reports having to hold onto furniture as she walked because of the dizziness. Additionally she had some associated nausea. She states that she was able to eat some crackers and ginger ale without any vomiting. She has a complicated history of chronic eye issues (including left eye blurry vision and increased elevated IOPs) for which she sees Dr. [name] for. She went to his office where her IOPs were 20 bilaterally. When she was still complaining of some dizziness, she was told to go to the ED. On ED arrival, she was given zofran which she states improved her nausea. Her dizziness has also resolved since being here in the ED, though she states she still has it slightly. She now reports she is very anxious and "her nerves are acting up her PTSD." She denies any speech difficulty, CP, SOB, numbness/weakness of her extremities, abdominal pain, new blurry vision.   The history is provided by the patient.    Past Medical History:  Diagnosis Date  . Anemia    as a child  . Anxiety    takes Xanax daily  . Arthritis   . Cataracts, bilateral    immature  . Chronic insomnia 10/11/2014  . Complication of anesthesia   . Constipation    will occasionally take Milk of Mag  . Diverticulosis   . Dizziness    rarely  . GERD (gastroesophageal reflux disease)    takes Omeprazole every other day  . Glaucoma    borderline and no drops required  . Hearing loss    but doesn't have hearing aids  . History of bronchitis    many many yrs ago  . History of colitis   . History of colon polyps   . Hypertension    hasn't been on meds for the past 72yrs   . Insomnia    takes Trazodone nightly as needed and Ambien nightly   . 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 Joint pain   . Joint swelling   . PONV (postoperative nausea and vomiting)   . Tinnitus    takes HCTZ daily to decrease pressure in ears    Patient Active Problem List   Diagnosis Date Noted  . GERD (gastroesophageal reflux disease) 04/21/2018  . Chronic post-traumatic stress disorder (PTSD) 03/14/2018  . BV (bacterial vaginosis) 03/14/2018  . Tinnitus 03/14/2018  . Routine general medical examination at a health care facility 03/14/2018  . Abnormality of gait 06/03/2015  . S/P TKR (total knee replacement) 06/03/2015  . Chronic left SI joint pain 06/03/2015  . Chronic insomnia 10/11/2014  . DJD (degenerative joint disease) of knee 03/13/2014    Past Surgical History:  Procedure Laterality Date  . ABDOMINAL HYSTERECTOMY    . APPENDECTOMY    . COLONOSCOPY    . IR RADIOLOGIST EVAL & MGMT  06/08/2017  . tinnitus Right   . TONSILLECTOMY    . TOTAL KNEE ARTHROPLASTY Right 03/13/2014  DR Percell Miller  . TOTAL KNEE ARTHROPLASTY Right 03/13/2014   Procedure: TOTAL KNEE ARTHROPLASTY;  Surgeon: [name], MD;  Location: [facility];  Service: Orthopedics;  Laterality: Right;  . TUBAL LIGATION       OB History   None      Home Medications    Prior to Admission medications   Medication Sig Start Date End Date Taking? Authorizing Provider  ALPRAZolam Duanne Moron) 1 MG tablet Take 1 mg by mouth 2 (two) times daily.    [provider]  cholecalciferol (VITAMIN D) 1000 UNITS tablet Take 2,000 Units by mouth daily. Reported on 06/01/2016    [provider]  ibuprofen (ADVIL,MOTRIN) 600 MG tablet Take 1 tablet (600 mg total) by mouth every 6 (six) hours as needed. 06/27/17   Nona Dell, PA-C  lidocaine (XYLOCAINE) 2 % solution Use as directed 20 mLs in the mouth or throat as needed for mouth pain. 05/03/18   [name], MD  meclizine (ANTIVERT) 12.5 MG tablet Take 1 tablet (12.5 mg total) by mouth 3 (three) times daily as needed for dizziness. 09/09/18   [name], PA-C  metroNIDAZOLE (FLAGYL) 500 MG tablet Take 1 tablet (500 mg total) by mouth 3 (three) times daily. 03/14/18   [name], MD  metroNIDAZOLE (METROGEL VAGINAL) 0.75 % vaginal gel Place 1 Applicatorful vaginally 2 (two) times daily. 03/15/18   [name], MD  omeprazole (PRILOSEC) 40 MG capsule TAKE ONE CAPSULE BY MOUTH 20 MINUTES BEFORE BREAKFAST 01/13/18   [provider]  pantoprazole (PROTONIX) 40 MG tablet Take 1 tablet (40 mg total) by mouth 2 (two) times daily before a meal. Follow-up appt is due in Sept must see provider for future refills 07/17/18   [name], MD  polyethylene glycol [facility]) packet Take 17 g by mouth daily as needed.     [provider]  Sucralfate (CARAFATE PO) Carafate    [provider]  zolpidem (AMBIEN) 10 MG tablet Take 10 mg by mouth at bedtime as needed for sleep. May take 1 and 1/2 tab = 15 mg    [provider]    Family History Family History  Problem Relation Age of Onset  . Cancer - Other Father   . Kidney disease Father   . Cancer - Other Sister        breast ca  . Cancer - Other Brother        lymphoma in remission  . Cancer - Other Sister        in remission breast ca    Social History Social History   Tobacco Use  . Smoking status: Former Smoker    Packs/day: 1.00    Years: 22.00    Pack years: 22.00    Types: Cigarettes    Start date: 03/08/1972    Last attempt to quit: 01/08/1994    Years since quitting: 24.6  . Smokeless tobacco: Never Used  . Tobacco comment: quit smoking 71yrs ago  Substance Use Topics  . Alcohol use: No    Alcohol/week: 0.0  standard drinks  . Drug use: No     Allergies   Contrast media [iodinated diagnostic agents] and Hydrocodone   Review of Systems Review of Systems  Constitutional: Negative for fever.  Eyes: Positive for visual disturbance (Chronic).  Respiratory: Negative for cough and shortness of breath.   Cardiovascular: Negative for chest pain.  Gastrointestinal: Positive for nausea. Negative for abdominal pain and  vomiting.  Genitourinary: Negative for dysuria and hematuria.  Neurological: Positive for dizziness. Negative for weakness, numbness and headaches.  All other systems reviewed and are negative.    Physical Exam Updated Vital Signs BP 139/61 (BP Location: Right Arm)   Pulse 63   Temp 98.7 F (37.1 C) (Oral)   Resp 18   SpO2 100%   Physical Exam  Constitutional: She is oriented to person, place, and time. She appears well-developed and well-nourished.  Very anxious  HENT:  Head: Normocephalic and atraumatic.  Mouth/Throat: Oropharynx is clear and moist and mucous membranes are normal.  Eyes: Pupils are equal, round, and reactive to light. Conjunctivae and lids are normal. Right eye exhibits nystagmus. Left eye exhibits nystagmus.  Mild horizontal nystagmus to the left.  No veritical, rotational nystagmus  Neck: Full passive range of motion without pain.  Cardiovascular: Normal rate, regular rhythm, normal heart sounds and normal pulses. Exam reveals no gallop and no friction rub.  No murmur heard. Pulmonary/Chest: Effort normal and breath sounds normal.  Abdominal: Soft. Normal appearance. There is no tenderness. There is no rigidity and no guarding.  Musculoskeletal: Normal range of motion.  Neurological: She is alert and oriented to person, place, and time.  Cranial nerves III-XII intact Follows commands, Moves all extremities  5/5 strength to BUE and BLE  Sensation intact throughout all major nerve distributions Normal finger to nose. No dysdiadochokinesia. No  pronator drift. No gait abnormalities  No slurred speech. No facial droop.   Skin: Skin is warm and dry. Capillary refill takes less than 2 seconds.  Psychiatric: She has a normal mood and affect. Her speech is normal.  Nursing note and vitals reviewed.    ED Treatments / Results  Labs (all labs ordered are listed, but only abnormal results are displayed) Labs Reviewed  COMPREHENSIVE METABOLIC PANEL - Abnormal; Notable for the following components:      Result Value   Glucose, Bld 103 (*)    All other components within normal limits  CBC - Abnormal; Notable for the following components:   Platelets 120 (*)    All other components within normal limits  URINALYSIS, ROUTINE W REFLEX MICROSCOPIC - Abnormal; Notable for the following components:   Hgb urine dipstick SMALL (*)    Leukocytes, UA MODERATE (*)    Bacteria, UA RARE (*)    All other components within normal limits  LIPASE, BLOOD    EKG None  Radiology Mr Brain Wo Contrast  Result Date: 09/09/2018 CLINICAL DATA:  Acute presentation with nausea and dizziness. EXAM: MRI HEAD WITHOUT CONTRAST TECHNIQUE: Multiplanar, multiecho pulse sequences of the brain and surrounding structures were obtained without intravenous contrast. COMPARISON:  CT 08/12/2018 FINDINGS: Brain: Brain has normal appearance without evidence of malformation, atrophy, old or acute small or large vessel infarction, mass lesion, hemorrhage, hydrocephalus or extra-axial collection. Vascular: Major vessels at the base of the brain show flow. Venous sinuses appear patent. Skull and upper cervical spine: Normal. Sinuses/Orbits: Clear/normal. Other: None significant. IMPRESSION: Normal MRI of the brain for age. No cause of the presenting symptoms is identified. Electronically Signed   By: Nelson Chimes M.D.   On: 09/09/2018 20:56    Procedures Procedures (including critical care time)  Medications Ordered in ED Medications  ondansetron (ZOFRAN-ODT)  disintegrating tablet 4 mg (4 mg Oral Given 09/09/18 1340)  meclizine (ANTIVERT) tablet 12.5 mg (12.5 mg Oral Given 09/09/18 1642)  LORazepam (ATIVAN) tablet 1 mg (1 mg Oral Given 09/09/18 1642)  sodium  chloride 0.9 % bolus 1,000 mL (0 mLs Intravenous Stopped 09/09/18 2038)  LORazepam (ATIVAN) injection 1 mg (1 mg Intravenous Given 09/09/18 2002)     Initial Impression / Assessment and Plan / ED Course  I have reviewed the triage vital signs and the nursing notes.  Pertinent labs & imaging results that were available during my care of the patient were reviewed by me and considered in my medical decision making (see chart for details).     67 y.o. F with PMH/o Anemia, Anxiety, GERD, Chronic left eye vision abnormalities  who presents for evaluation of dizziness and nausea that began this morning. Patient is afebrile, non-toxic appearing, sitting comfortably on examination table. Vital signs reviewed and stable. No neuro deficits noted on exam. Dizziness and nausea have improved since being here in the ED. Still with some mild left sided nystagmus. Consider dehydration vs vertigo. Also consider UTI given patient's age. Do not suspect CVA given history/physical exam. Additionally, as a CVA it would be atypical to have improvement in her symptoms. Will plan for IVF, meclizine. Labs and UA ordered at triage.   CBC with no significant leukocytosis or anemia.  Platelets are 120.  Review of records show this consistent with previous.  Lipase unremarkable.  CMP shows glucose of 103.  Otherwise unremarkable.  UA shows moderate leukocytes, rare bacteria.  No nitrites, pyuria.  Patient reports feeling better after meds here in the ED. Currently asymptomatic. Will plan for PO challenge and ambulate patient.   Discussed patient with Dr. Lita Mains. Given patient's history, would like to obtain MRI to rule out posterior circulation stroke.   MRI reviewed.  Negative for any acute stroke.   Discussed results with  patient.  She has had no more dizziness or nausea since being here in the ED.  His inability to the bathroom sometimes without any difficulty.  She has been tolerating p.o. without any difficulty.  Patient is deemed hemodynamically stable for discharge.  Question vertigo versus anxiety.  She did seem to have some improvement after the meclizine though she was Artie having some improvement symptoms when she came to the ED.  We will give her a short course of meclizine.  Encourage primary care follow-up for further evaluation. Patient had ample opportunity for questions and discussion. All patient's questions were answered with full understanding. Strict return precautions discussed. Patient expresses understanding and agreement to plan.    Final Clinical Impressions(s) / ED Diagnoses   Final diagnoses:  Vertigo    ED Discharge Orders         Ordered    meclizine (ANTIVERT) 12.5 MG tablet  3 times daily PRN     09/09/18 2114           [name], PA-C 09/09/18 2316    Julianne Rice, MD 09/10/18 [phone]

## 2018-09-09 NOTE — ED Notes (Signed)
Pt yelling that she needs to get out of the hospital, and is going to remove her own IV. Writer called into room by nurse tech to assist with discharge.

## 2018-09-09 NOTE — ED Notes (Signed)
Discharge instructions reviewed with pt. PT verbalized understanding. Pt to pick up prescription. PIV removed. Pt ambulatory to waiting room with family.

## 2018-09-09 NOTE — ED Triage Notes (Signed)
She reports having much nausea with few episodes of emesis this morning. She states she awoke this morning at about 0700 with some dizziness. She states she had a recent blepharoplasty; and had some trouble afterward. She states she saw her ophthalmologist today, who told her her bilat. Eye pressures were "ok" and sent her here.

## 2018-09-09 NOTE — ED Notes (Signed)
Per previous nurse, patient walked from restroom to room.

## 2018-09-09 NOTE — Discharge Instructions (Addendum)
Your MRI today showed no stroke.  As we discussed, this may be vertigo.  Please take medication as directed.  Make sure you are drinking plenty of fluids and stay hydrated.  Please follow-up with your primary care doctor next 2 to 4 days for further evaluation.  Return to emergency department for any chest pain, difficulty breathing, abdominal pain, vomiting, numbness/weakness of your arms or legs, difficulty walking or any other worsening or concerning symptoms.

## 2018-09-09 NOTE — ED Notes (Signed)
MRI notified of case in the emergency room.

## 2018-09-09 NOTE — ED Notes (Signed)
Patient was given water for PO challenge and stated that she had already drank an entire can of Sprite

## 2018-09-11 NOTE — Telephone Encounter (Signed)
She would need face to face visit with [place] per medicare. She can likely call her surgeon and get the orders from them without going in. What kind of surgery did she have?

## 2018-09-11 NOTE — Telephone Encounter (Signed)
Called patient in regard to these orders as I could not get a hold of melissa. Informed patient that per medicare she would need a visit with Dr. Sharlet Salina unless she wanted to go through the surgeon. Patient stated that she will call back to make an appointment with Dr. Sharlet Salina this week because she really needs these orders

## 2018-09-13 DIAGNOSIS — H40033 Anatomical narrow angle, bilateral: Secondary | ICD-10-CM | POA: Diagnosis not present

## 2018-09-13 DIAGNOSIS — H43813 Vitreous degeneration, bilateral: Secondary | ICD-10-CM | POA: Diagnosis not present

## 2018-09-13 DIAGNOSIS — H47012 Ischemic optic neuropathy, left eye: Secondary | ICD-10-CM | POA: Diagnosis not present

## 2018-09-14 ENCOUNTER — Encounter: Payer: Self-pay | Admitting: Internal Medicine

## 2018-09-14 ENCOUNTER — Ambulatory Visit (INDEPENDENT_AMBULATORY_CARE_PROVIDER_SITE_OTHER): Payer: PPO | Admitting: Internal Medicine

## 2018-09-14 VITALS — BP 130/88 | HR 66 | Temp 97.9°F | Ht 64.0 in | Wt 219.0 lb

## 2018-09-14 DIAGNOSIS — F4312 Post-traumatic stress disorder, chronic: Secondary | ICD-10-CM | POA: Diagnosis not present

## 2018-09-14 DIAGNOSIS — H547 Unspecified visual loss: Secondary | ICD-10-CM

## 2018-09-14 MED ORDER — ESCITALOPRAM OXALATE 5 MG PO TABS: 5.0000 mg | ORAL_TABLET | Freq: Every day | ORAL | 3 refills | Status: DC

## 2018-09-14 NOTE — Patient Instructions (Addendum)
We will get home health out to your house.   Call the [facility] to see if they can also provide information or resources to keep you independent. [phone]

## 2018-09-14 NOTE — Assessment & Plan Note (Signed)
Worse with this traumatic event and coupled with loss of independence is hard for her to deal with. She is seeing therapist. Rx for lexapro 5 mg daily to consider taking. She does have xanax and ambien through [place] for sleeping and anxiety and advised that these will not have to change. We talked about how this is truly like going through a loss for her vision and may take time and lexapro cannot change this but may be able to help her cope with this better.

## 2018-09-14 NOTE — Assessment & Plan Note (Addendum)
New blindness in left eye with poor vision in the right eye. This has caused her to be not moving as much. Ordered home health for nursing, PT/OT evaluation (not moving as well at visit as normal) and aide. She is working with eye specialists still. Given information about [employer] [facility] in [place].

## 2018-09-14 NOTE — Progress Notes (Signed)
   Subjective:    Patient ID: Gabrielle [name], female    DOB: 1951-06-01, 67 y.o.   MRN: [id number]  HPI The patient is a 67 YO female coming in for several concerns including new blindness (had operation on her left eye and lost total vision in the eye, this was supposed to be routine procedure and poor vision in the right eye due to increasing pressures, she has been to many eye specialists recently and they do not think vision will return in the left eye and the right eye is finally getting under control with pressures with some medications, some poor vision and blurring of her vision) and PTSD worsening (seeing therapist and has thought about taking lexapro in the past, sees psych at the [place] and they have tried to get her to take this in the past, dealing with loss of vision has caused her to back track on her mental health, she was doing well with this before the eye surgery, she denies SI/HI, having to struggle with loss of independence as well).   Review of Systems  Constitutional: Positive for activity change, appetite change and unexpected weight change.  HENT: Negative.   Eyes: Positive for visual disturbance.  Respiratory: Negative for cough, chest tightness and shortness of breath.   Cardiovascular: Negative for chest pain, palpitations and leg swelling.  Gastrointestinal: Negative for abdominal distention, abdominal pain, constipation, diarrhea, nausea and vomiting.  Musculoskeletal: Negative.   Skin: Negative.   Neurological: Negative.   Psychiatric/Behavioral: Negative.       Objective:   Physical Exam  Constitutional: She is oriented to person, place, and time. She appears well-developed and well-nourished.  Has lost weight since prior visit  HENT:  Head: Normocephalic and atraumatic.  Eyes:  Loss of vision left and right with blurring  Neck: Normal range of motion.  Cardiovascular: Normal rate and regular rhythm.  Pulmonary/Chest: Effort normal and breath sounds normal. No  respiratory distress. She has no wheezes. She has no rales.  Abdominal: Soft. Bowel sounds are normal. She exhibits no distension. There is no tenderness. There is no rebound.  Musculoskeletal: She exhibits no edema.  Neurological: She is alert and oriented to person, place, and time. Coordination normal.  Skin: Skin is warm and dry.  Psychiatric:  Appropriately distressed during visit   Vitals:   09/14/18 1319  BP: 130/88  Pulse: 66  Temp: 97.9 F (36.6 C)  TempSrc: Oral  SpO2: 98%  Weight: 219 lb (99.3 kg)  Height: 5\' 4"  (1.626 m)      Assessment & Plan:  Visit time 25 minutes: greater than 50% of that time was spent in face to face counseling and coordination of care with the patient: counseled about coping with the loss of her vision in her left eye and dealing with subpar vision in the right eye

## 2018-09-15 ENCOUNTER — Other Ambulatory Visit: Payer: Self-pay

## 2018-09-15 NOTE — Patient Outreach (Signed)
[facility]) Care Management  09/15/2018  Gabrielle [name] [birth year] [id number]  Successful outreach to the patient on today's date, HIPAA identifiers confirmed. BSW introduced self to the patient and the reason for today's call, indicating BSW was calling to confirm receipt of mailings. The patient is unable to confirm resources have been received. The patient is unable to recall previous conversation with this BSW. BSW reminded the patient of prior call to help identify resources for the patient surrounding a caregiver and transportation.  The patient states she is awaiting a return call from [facility] to establish if the patient is approved for home health services. The patient reports seeing her physician yesterday to have a face to face encounter in hope of receiving home health aide services. BSW discussed closing the patients case considering the patient feels she will not need other resources. The patient requests BSW mail a business card for future needs.   During today's call, the patient requested a nurse "to help with my needs". BSW discussed with the patient the role of [facility] Care Management nurses and how they are able to provide education and assistance in meeting goals. The patient self-identified as having anxiety, depression, and PTSD. The patient stated "I am not sure if they can help me with that". The patient stated she is willing and ready to work with someone on the above self-identified diagnoses. BSW spoke with the patient about a CSW contacting her to further evaluate and assist with linking to resources. The patient is open to this referral and appreciative.  BSW has placed a referral for the patient to be contacted by a CSW. BSW to close self to the patient as all community resource needs have been met at this time.  Daneen Schick, BSW, CDP [facility] [phone]

## 2018-09-18 ENCOUNTER — Other Ambulatory Visit: Payer: Self-pay | Admitting: *Deleted

## 2018-09-18 ENCOUNTER — Encounter: Payer: Self-pay | Admitting: *Deleted

## 2018-09-18 ENCOUNTER — Telehealth: Payer: Self-pay

## 2018-09-18 NOTE — Telephone Encounter (Signed)
Called patient and stated that she is seeing a psychiatrist and he prescribes ambien and alprazolam patient had talked to him about being switched to ativan and was only told that he could not do that. Patient is wondering what the reason could be since he did not give her a reason. She was curious if it was not a appropriate with the medications she is currently on or what. States that she has gotten the ativan at the hospital. Patient stated that she needs something that with work faster and last in system longer for her to keep calm. Patient also complains that she is only getting four hours of sleep on current ambien and didn't know if there was anything else that she could try or switch to. Patient is aware that PCP is out of town till the 1st

## 2018-09-18 NOTE — Telephone Encounter (Signed)
Patient was wondering when she would get a call about the referral for home health states Dr. Sharlet Salina told her either Friday or Monday. I told patient that these can sometimes take longer but that I would send a message and see.

## 2018-09-18 NOTE — Telephone Encounter (Signed)
Copied from [place] [id number]. Topic: Quick Communication - See Telephone Encounter >> Sep 18, 2018 11:15 AM [place] wrote: CRM for notification. See Telephone encounter for: 09/18/18.

## 2018-09-18 NOTE — Patient Outreach (Addendum)
[facility]) Care Management  09/18/2018  Gabrielle [name] Jan 10, 1951 [id number]   CSW was able to make initial contact with patient today to perform phone assessment, as well as assess and assist with social work needs and services.  CSW introduced self, explained role and types of services provided through [facility]).  CSW further explained to patient that CSW works with patient's BSW, also with [facility], Daneen Schick. CSW then explained the reason for the call, indicating that Mrs. Humble thought that patient would benefit from social work services and resources to assist with symptoms of Anxiety, Depression and Post Traumatic Stress Disorder.  CSW obtained two HIPAA compliant identifiers from patient, which included patient's name and date of birth.  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  Patient admits that she has been suffering from these symptoms for quite some time, but has not received treatment or therapeutic services.  CSW noted that patient currently takes Xanax 1 MG PO, twice daily for Anxiety, as well as Lexapro 5 MG PO, once daily for Depression.  Patient reported that she takes her antianxiety and antidepressant medication exactly as prescribed.  CSW offered to provide counseling and supportive services to patient, in the privacy of her own home, which patient gladly accepted.  The initial home visit has been scheduled for Thursday, October 05, 2018 at [facility] will print EMMI information for patient, pertaining specifically to "Signs & Symptoms of Depression" to review with patient during the initial home visit.  THN CM Care Plan Problem One     Most Recent Value  Care Plan Problem One  Patient is suffering from Anxiety, Depression and Post Traumatic Stress Disorder.  Role Documenting the Problem One  Clinical Social Worker   Care Plan for Problem One  Active  [facility] Long Term Goal   Patient will have a reduction in symptoms of Anxiety, Depression and Post Traumatic Stress Disorder, through counseling, supportive services and literature, within the next 45 days.  THN Long Term Goal Start Date  09/18/18  Interventions for Problem One Long Term Goal  CSW will provide counseling and supportive services to patient, as well as provide literature on Depression, Anxiety and Post Traumatic Stress Disorder to review with patient.  THN CM Short Term Goal #1   Patient will meet with CSW for an initial home visit, to receive counseling and supportive services, within the next three weeks.  THN CM Short Term Goal #1 Start Date  09/18/18  Interventions for Short Term Goal #1  An initial home visit has been scheduled with patient for Thursday, October 05, 2018 at [facility] Short Term Goal #2   Patient will review EMMI information provided to her regarding "Signs and Symptoms of Depression", provided to her by CSW, within the next three weeks.  THN CM Short Term Goal #2 Start Date  09/18/18  Interventions for Short Term Goal #2  CSW will print EMMI information for patient to provide during the initial home visit.  THN CM Short Term Goal #3  Patient will practice deep breathing exercises and relaxation techniques, taught to her by CSW, during the next 30 days.  THN CM Short Term Goal #3 Start Date  09/18/18  Interventions for Short Tern Goal #3  CSW will teach patient deep breathing exercises and relaxation techniques for her to use when she becomes anxious and/or depressed.  [name], BSW, MSW, LCSW  Licensed Education officer, [facility]  Mailing [place] [address] [place], [place] [address] Physical Address-[address], [place], [place] [address] Toll Free Main # [phone] Fax # [phone] Cell # [phone]  Office #  [phone] [email or web address]

## 2018-09-18 NOTE — Telephone Encounter (Signed)
Patient informed of response and stated understanding

## 2018-09-18 NOTE — Telephone Encounter (Signed)
See request to Dr. Nathanial Millman nurse, [name]

## 2018-09-18 NOTE — Telephone Encounter (Signed)
I sent it to [facility] and I just received a msg back from them. She should be hearing from them really soon. If they are not able to help her they will find some place that will.

## 2018-09-21 ENCOUNTER — Ambulatory Visit: Payer: Self-pay | Admitting: *Deleted

## 2018-09-21 ENCOUNTER — Ambulatory Visit: Payer: Self-pay | Admitting: Family

## 2018-09-21 DIAGNOSIS — R82998 Other abnormal findings in urine: Secondary | ICD-10-CM | POA: Diagnosis not present

## 2018-09-21 NOTE — Telephone Encounter (Signed)
noted 

## 2018-09-21 NOTE — Telephone Encounter (Signed)
LVM giving patient the phone number to facility so she can call and get update

## 2018-09-21 NOTE — Telephone Encounter (Signed)
Pt called to state that she hasnt heard from a home health facility yet; pt was asking for Lesly Rubenstein to call her for an update when possible

## 2018-09-23 DIAGNOSIS — H547 Unspecified visual loss: Secondary | ICD-10-CM | POA: Diagnosis not present

## 2018-09-23 DIAGNOSIS — Z87891 Personal history of nicotine dependence: Secondary | ICD-10-CM | POA: Diagnosis not present

## 2018-09-23 DIAGNOSIS — Z48810 Encounter for surgical aftercare following surgery on the sense organs: Secondary | ICD-10-CM | POA: Diagnosis not present

## 2018-09-23 DIAGNOSIS — K219 Gastro-esophageal reflux disease without esophagitis: Secondary | ICD-10-CM | POA: Diagnosis not present

## 2018-09-23 DIAGNOSIS — F419 Anxiety disorder, unspecified: Secondary | ICD-10-CM | POA: Diagnosis not present

## 2018-09-23 DIAGNOSIS — F4310 Post-traumatic stress disorder, unspecified: Secondary | ICD-10-CM | POA: Diagnosis not present

## 2018-09-24 DIAGNOSIS — H04123 Dry eye syndrome of bilateral lacrimal glands: Secondary | ICD-10-CM | POA: Diagnosis not present

## 2018-09-24 DIAGNOSIS — H538 Other visual disturbances: Secondary | ICD-10-CM | POA: Diagnosis not present

## 2018-09-24 DIAGNOSIS — H40051 Ocular hypertension, right eye: Secondary | ICD-10-CM | POA: Diagnosis not present

## 2018-09-24 DIAGNOSIS — H40033 Anatomical narrow angle, bilateral: Secondary | ICD-10-CM | POA: Diagnosis not present

## 2018-09-26 NOTE — Telephone Encounter (Signed)
That would be between her and her psychiatrist. I cannot adjust her regimen and she would need to talk to them about those reasons. I am not that doctor and cannot tell her that doctor's concerns.

## 2018-09-27 ENCOUNTER — Telehealth: Payer: Self-pay | Admitting: Internal Medicine

## 2018-09-27 NOTE — Telephone Encounter (Signed)
Copied from [place] [id number]. Topic: Quick Communication - See Telephone Encounter >> Sep 27, 2018  3:02 PM Con[place] Chancy, NT wrote: CRM for notification. See Telephone encounter for: 09/27/18.  Gabrielle [name] is calling from [facility] and is requesting verbal orders for home health skilled nursing for disease management and skilled nursing assessment due to recent surgery. 1x a week for 5 weeks. Also requesting physical therapy, occupational therapy and medical social work evaluation.  Cb# [phone]

## 2018-09-27 NOTE — Telephone Encounter (Signed)
Informed patient of MD response and patient stated understanding. Patient states that she went to [facility] and was given hydroxyzine and was told not to take it with her alprazolam or xanax and that it would help patient relax. Patient wanted to know if this medication in non-addictive and if it could help her wean off or take less of the xanax.

## 2018-09-28 NOTE — Telephone Encounter (Signed)
Called pt to verify what surgery she had. 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 Pt states she saw MD couple weeks ago and was going to order home health. 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 This has caused her to be not moving as much. Ordered home health for nursing, PT/OT evaluation (not moving as well at visit as normal) and aide"...Johny Chess

## 2018-09-28 NOTE — Telephone Encounter (Signed)
This medication is similar to benadryl and is not addictive.

## 2018-09-28 NOTE — Telephone Encounter (Signed)
Patient informed of MD response and stated understanding  

## 2018-09-28 NOTE — Telephone Encounter (Signed)
Fine then.

## 2018-09-28 NOTE — Telephone Encounter (Signed)
What surgery did she have? The surgeon should order her PT for that since they know her advised activity level.

## 2018-09-29 NOTE — Telephone Encounter (Signed)
Notified Elmyra Ricks w/MD response.Marland KitchenJohny Chess

## 2018-10-03 DIAGNOSIS — M545 Low back pain: Secondary | ICD-10-CM | POA: Diagnosis not present

## 2018-10-03 DIAGNOSIS — F411 Generalized anxiety disorder: Secondary | ICD-10-CM | POA: Diagnosis not present

## 2018-10-03 DIAGNOSIS — I10 Essential (primary) hypertension: Secondary | ICD-10-CM | POA: Diagnosis not present

## 2018-10-03 DIAGNOSIS — E785 Hyperlipidemia, unspecified: Secondary | ICD-10-CM | POA: Diagnosis not present

## 2018-10-03 DIAGNOSIS — Z0000 Encounter for general adult medical examination without abnormal findings: Secondary | ICD-10-CM | POA: Diagnosis not present

## 2018-10-03 DIAGNOSIS — D696 Thrombocytopenia, unspecified: Secondary | ICD-10-CM | POA: Diagnosis not present

## 2018-10-03 DIAGNOSIS — H9319 Tinnitus, unspecified ear: Secondary | ICD-10-CM | POA: Diagnosis not present

## 2018-10-04 DIAGNOSIS — H40051 Ocular hypertension, right eye: Secondary | ICD-10-CM | POA: Diagnosis not present

## 2018-10-04 DIAGNOSIS — H04123 Dry eye syndrome of bilateral lacrimal glands: Secondary | ICD-10-CM | POA: Diagnosis not present

## 2018-10-04 DIAGNOSIS — H40033 Anatomical narrow angle, bilateral: Secondary | ICD-10-CM | POA: Diagnosis not present

## 2018-10-04 DIAGNOSIS — H538 Other visual disturbances: Secondary | ICD-10-CM | POA: Diagnosis not present

## 2018-10-05 ENCOUNTER — Other Ambulatory Visit: Payer: Self-pay | Admitting: *Deleted

## 2018-10-05 NOTE — Patient Outreach (Signed)
[facility]) Care Management  10/05/2018  Gabrielle [name] [birth year] [id number]   CSW was scheduled to meet with patient for the initial home visit today (Thursday, October 05, 2018 at 9:00 AM) to provide counseling and supportive services; however, when CSW called patient this morning to remind her of her appointment with CSW, patient denied having ever spoken with CSW, nor did she remember scheduling the initial visit.  Patient stated, "I have had so many people calling me from so many different agencies that it's hard for me to keep it all straight".  CSW voiced understanding, inquiring as to whether or not patient still wanted to meet with CSW today.  Patient denied, reporting "I already have a therapist through the [facility] with whom I am scheduled to see on Friday, October 06, 2018 at 11:00 AM.  Patient did not wish to establish care with CSW, admitting that it would just be a duplication of services.  Patient stated, "I don't want to have to go through my whole life history with someone new".  Patient then went on to say that she is very displeased with her therapist, and that she has strongly considered seeking mental health services elsewhere.  Patient requested that CSW contact her at 4:00 PM on Friday, October 06, 2018, after she has met with her therapist, to determine whether or not she would like to receive counseling and supportive services through [facility].  THN CM Care Plan Problem One     Most Recent Value  Care Plan Problem One  Patient is suffering from Anxiety, Depression and Post Traumatic Stress Disorder.  Role Documenting the Problem One  Clinical Social Worker  Care Plan for Problem One  Active  [facility] Long Term Goal   Patient will have a reduction in symptoms of Anxiety, Depression and Post Traumatic Stress Disorder, through counseling, supportive services and literature, within the next 45 days.  THN Long Term Goal Start Date  09/18/18  [facility]  CM Short Term Goal #1   Patient will meet with CSW for an initial home visit, to receive counseling and supportive services, within the next three weeks.  THN CM Short Term Goal #1 Start Date  09/18/18  [facility] CM Short Term Goal #1 Met Date  10/05/18  Interventions for Short Term Goal #1  Patient cancelled her initial home visit with CSW, scheduled for today, indicating that she is already established with a therapist at [facility].  THN CM Short Term Goal #2   Patient will review EMMI information provided to her regarding "Signs and Symptoms of Depression", provided to her by CSW, within the next three weeks.  THN CM Short Term Goal #2 Start Date  09/18/18  [facility] CM Short Term Goal #3  Patient will practice deep breathing exercises and relaxation techniques, taught to her by CSW, during the next 30 days.  THN CM Short Term Goal #3 Start Date  09/18/18     [name], BSW, MSW, [place]  Licensed Clinical Social Worker  [place]  Mailing [place]. [address] [place], [place] [address] Physical Address-[address], [place], [place] [address] Toll Free Main # [phone] Fax # [phone] Cell # [phone]  Office # [phone] [email or web address]

## 2018-10-06 ENCOUNTER — Encounter: Payer: Self-pay | Admitting: Internal Medicine

## 2018-10-06 ENCOUNTER — Ambulatory Visit (INDEPENDENT_AMBULATORY_CARE_PROVIDER_SITE_OTHER): Payer: PPO | Admitting: Internal Medicine

## 2018-10-06 ENCOUNTER — Ambulatory Visit (INDEPENDENT_AMBULATORY_CARE_PROVIDER_SITE_OTHER): Payer: PPO | Admitting: *Deleted

## 2018-10-06 ENCOUNTER — Other Ambulatory Visit (INDEPENDENT_AMBULATORY_CARE_PROVIDER_SITE_OTHER): Payer: PPO

## 2018-10-06 ENCOUNTER — Telehealth: Payer: Self-pay

## 2018-10-06 ENCOUNTER — Other Ambulatory Visit: Payer: Self-pay | Admitting: *Deleted

## 2018-10-06 VITALS — BP 120/80 | HR 59 | Temp 97.7°F | Ht 64.0 in | Wt 219.0 lb

## 2018-10-06 VITALS — BP 120/80 | HR 59 | Ht 64.0 in | Wt 219.0 lb

## 2018-10-06 DIAGNOSIS — Z0000 Encounter for general adult medical examination without abnormal findings: Secondary | ICD-10-CM

## 2018-10-06 DIAGNOSIS — F4312 Post-traumatic stress disorder, chronic: Secondary | ICD-10-CM | POA: Diagnosis not present

## 2018-10-06 LAB — URINALYSIS, ROUTINE W REFLEX MICROSCOPIC
Bilirubin Urine: NEGATIVE
KETONES UR: NEGATIVE
Nitrite: NEGATIVE
PH: 6 (ref 5.0–8.0)
SPECIFIC GRAVITY, URINE: 1.015 (ref 1.000–1.030)
Total Protein, Urine: NEGATIVE
URINE GLUCOSE: NEGATIVE
UROBILINOGEN UA: 0.2 (ref 0.0–1.0)

## 2018-10-06 LAB — CBC
HCT: 41 % (ref 36.0–46.0)
Hemoglobin: 13.7 g/dL (ref 12.0–15.0)
MCHC: 33.4 g/dL (ref 30.0–36.0)
MCV: 86.9 fl (ref 78.0–100.0)
Platelets: 110 10*3/uL — ABNORMAL LOW (ref 150.0–400.0)
RBC: 4.72 Mil/uL (ref 3.87–5.11)
RDW: 14.2 % (ref 11.5–15.5)
WBC: 7 10*3/uL (ref 4.0–10.5)

## 2018-10-06 LAB — COMPREHENSIVE METABOLIC PANEL
ALT: 15 U/L (ref 0–35)
AST: 20 U/L (ref 0–37)
Albumin: 3.6 g/dL (ref 3.5–5.2)
Alkaline Phosphatase: 82 U/L (ref 39–117)
BUN: 15 mg/dL (ref 6–23)
CO2: 32 meq/L (ref 19–32)
CREATININE: 0.98 mg/dL (ref 0.40–1.20)
Calcium: 9 mg/dL (ref 8.4–10.5)
Chloride: 106 mEq/L (ref 96–112)
GFR: 72.74 mL/min (ref >60.00)
Glucose, Bld: 95 mg/dL (ref 70–99)
Potassium: 3.7 mEq/L (ref 3.5–5.1)
SODIUM: 142 meq/L (ref 135–145)
Total Bilirubin: 0.3 mg/dL (ref 0.2–1.2)
Total Protein: 6.6 g/dL (ref 6.0–8.3)

## 2018-10-06 LAB — LIPID PANEL
Cholesterol: 174 mg/dL (ref 0–200)
HDL: 36.2 mg/dL — ABNORMAL LOW (ref >39.00)
NONHDL: 137.5
Total CHOL/HDL Ratio: 5
Triglycerides: 235 mg/dL — ABNORMAL HIGH (ref 0.0–149.0)
VLDL: 47 mg/dL — AB (ref 0.0–40.0)

## 2018-10-06 LAB — LDL CHOLESTEROL, DIRECT: Direct LDL: 109 mg/dL

## 2018-10-06 LAB — HEMOGLOBIN A1C: Hgb A1c MFr Bld: 5.1 % (ref 4.6–6.5)

## 2018-10-06 MED ORDER — BUSPIRONE HCL 5 MG PO TABS: 5.0000 mg | ORAL_TABLET | Freq: Three times a day (TID) | ORAL | 3 refills | Status: DC

## 2018-10-06 MED ORDER — TRAZODONE HCL 50 MG PO TABS: 50.0000 mg | ORAL_TABLET | Freq: Every evening | ORAL | 3 refills | Status: DC | PRN

## 2018-10-06 NOTE — Patient Outreach (Signed)
[facility]) Care Management  10/06/2018  Gabrielle [name] 1951/03/17 [id number]   CSW contacted patient today to follow-up regarding social work services and resources, as well as to ensure that patient's therapist with the [facility] came out to patient's home today at 11:00 AM to perform their initial assessment.  According to patient, the therapist did not show up, nor did they call to cancel the appointment.  Patient requested that CSW contact her Psychiatrist at the [place], Dr. Camille Bal to inquire about patient's therapist.  Dr. Camille Bal was unavailable at the time of CSW's call; therefore, CSW left a HIPAA compliant message for Dr. Camille Bal and is currently awaiting a return call.  CSW agreed to follow-up with patient as soon as a return call is received from Dr. Camille Bal.  Otherwise, CSW will make arrangements to contact patient again on Friday, October 13, 2018 to inquire as to whether or not patient would like to receive counseling and supportive services through [facility]. THN CM Care Plan Problem One     Most Recent Value  Care Plan Problem One  Patient is suffering from Anxiety, Depression and Post Traumatic Stress Disorder.  Role Documenting the Problem One  Clinical Social Worker  Care Plan for Problem One  Active  [facility] Long Term Goal   Patient will have a reduction in symptoms of Anxiety, Depression and Post Traumatic Stress Disorder, through counseling, supportive services and literature, within the next 45 days.  THN Long Term Goal Start Date  09/18/18  [facility] CM Short Term Goal #1   Patient will meet with CSW for an initial home visit, to receive counseling and supportive services, within the next three weeks.  THN CM Short Term Goal #1 Start Date  09/18/18  THN CM Short Term Goal #1 Met Date  10/05/18  THN CM Short Term Goal #2   Patient will review EMMI information provided to her regarding "Signs and Symptoms of  Depression", provided to her by CSW, within the next three weeks.  THN CM Short Term Goal #2 Start Date  09/18/18  [facility] CM Short Term Goal #3  Patient will practice deep breathing exercises and relaxation techniques, taught to her by CSW, during the next 30 days.  THN CM Short Term Goal #3 Start Date  09/18/18    [name], BSW, MSW, [place]  Licensed Clinical Social Worker  [place]  Mailing [place]. [address] [place], [place] [address] Physical Address-[address], [place], [place] [address] Toll Free Main # [phone] Fax # [phone] Cell # [phone]  Office # [phone] [name]

## 2018-10-06 NOTE — Assessment & Plan Note (Signed)
Rx for buspar as she is interested in weaning off xanax and [place] if able. She is doing therapy from the [place] still.

## 2018-10-06 NOTE — Progress Notes (Signed)
Subjective:   Gabrielle [name] is a 67 y.o. female who presents for an Initial Medicare Annual Wellness Visit.  Review of Systems    No ROS.  Medicare Wellness Visit. Additional risk factors are reflected in the social history. Cardiac Risk Factors include: advanced age (>50men, >71 women);obesity (BMI >30kg/m2) Sleep patterns: has restless sleep, has frequent nighttime awakenings, gets up 2 times nightly to void and sleep varies nightly. Patient reports insomnia issues, discussed recommended sleep tips and stress reduction tips.   Home Safety/Smoke Alarms: Feels safe in home. Smoke alarms in place.  Living environment; residence and Firearm Safety: 1-story house/ trailer, no firearms. Lives alone, no needs for DME, good support system. Sister lives in the [place] next door to her.  Seat Belt Safety/Bike Helmet: Wears seat belt.     Objective:    Today's Vitals   10/06/18 1336  BP: 120/80  Pulse: (!) 59  SpO2: 98%  Weight: 219 lb (99.3 kg)  Height: 5\' 4"  (1.626 m)   Body mass index is 37.59 kg/m.  Advanced Directives 10/06/2018 09/18/2018 09/09/2018 08/12/2018 06/27/2017 06/01/2016 12/31/2015  Does Patient Have a Medical Advance Directive? No No No No No No No  Type of Advance Directive - - - - - - -  Would patient like information on creating a medical advance directive? No - Patient declined No - Patient declined No - Patient declined - No - Patient declined No - patient declined information No - patient declined information;Yes - Educational materials given  Pre-existing out of facility DNR order (yellow form or pink MOST form) - - - - - - -    Current Medications (verified) Outpatient Encounter Medications as of 10/06/2018  Medication Sig  . ALPRAZolam (XANAX) 1 MG tablet Take 1 mg by mouth 2 (two) times daily.  . cholecalciferol (VITAMIN D) 1000 UNITS tablet Take 2,000 Units by mouth daily. Reported on 06/01/2016  . escitalopram (LEXAPRO) 5 MG tablet Take 1 tablet (5 mg total) by  mouth daily. (Patient not taking: Reported on 10/06/2018)  . ibuprofen (ADVIL,MOTRIN) 600 MG tablet Take 1 tablet (600 mg total) by mouth every 6 (six) hours as needed.  . lidocaine (XYLOCAINE) 2 % solution Use as directed 20 mLs in the mouth or throat as needed for mouth pain.  . meclizine (ANTIVERT) 12.5 MG tablet Take 1 tablet (12.5 mg total) by mouth 3 (three) times daily as needed for dizziness.  . metroNIDAZOLE (FLAGYL) 500 MG tablet Take 1 tablet (500 mg total) by mouth 3 (three) times daily.  . metroNIDAZOLE (METROGEL VAGINAL) 0.75 % vaginal gel Place 1 Applicatorful vaginally 2 (two) times daily.  Marland Kitchen omeprazole (PRILOSEC) 40 MG capsule TAKE ONE CAPSULE BY MOUTH 20 MINUTES BEFORE BREAKFAST  . pantoprazole (PROTONIX) 40 MG tablet Take 1 tablet (40 mg total) by mouth 2 (two) times daily before a meal. Follow-up appt is due in Sept must see provider for future refills  . polyethylene glycol (MIRALAX) packet Take 17 g by mouth daily as needed.   . Sucralfate (CARAFATE PO) Carafate  . zolpidem (AMBIEN) 10 MG tablet Take 10 mg by mouth at bedtime as needed for sleep. May take 1 and 1/2 tab = 15 mg   No facility-administered encounter medications on file as of 10/06/2018.     Allergies (verified) Contrast media [iodinated diagnostic agents] and Hydrocodone   History: Past Medical History:  Diagnosis Date  . Anemia    as a child  . Anxiety    takes  Xanax daily  . Arthritis   . Cataracts, bilateral    immature  . Chronic insomnia 10/11/2014  . Complication of anesthesia   . Constipation    will occasionally take Milk of Mag  . Diverticulosis   . Dizziness    rarely  . GERD (gastroesophageal reflux disease)    takes Omeprazole every other day  . Glaucoma    borderline and no drops required  . Hearing loss    but doesn't have hearing aids  . History of bronchitis    many many yrs ago  . History of colitis   . History of colon polyps   . Hypertension    hasn't been on meds  for the past 44yrs   . Insomnia    takes Trazodone nightly as needed and Ambien nightly   . 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 Joint pain   . Joint swelling   . PONV (postoperative nausea and vomiting)   . Tinnitus    takes HCTZ daily to decrease pressure in ears   Past Surgical History:  Procedure Laterality Date  . ABDOMINAL HYSTERECTOMY    . APPENDECTOMY    . COLONOSCOPY    . IR RADIOLOGIST EVAL & MGMT  06/08/2017  . tinnitus Right   . TONSILLECTOMY    . TOTAL KNEE ARTHROPLASTY Right 03/13/2014   DR MURPHY  . TOTAL KNEE ARTHROPLASTY Right 03/13/2014   Procedure: TOTAL KNEE ARTHROPLASTY;  Surgeon: [name], MD;  Location: [facility];  Service: Orthopedics;  Laterality: Right;  . TUBAL LIGATION     Family History  Problem Relation Age of Onset  . Cancer - Other Father   . Kidney disease Father   . Cancer - Other Sister        breast ca  . Cancer - Other Brother        lymphoma in remission  . Cancer - Other Sister        in remission breast ca   Social History   Socioeconomic History  . Marital status: Divorced    Spouse name: Not on file  . Number of children: Not on file  . Years of education: Not on file  . Highest education level: Not on file  Occupational History  . Occupation: retired  Scientific laboratory technician  . Financial resource strain: Not hard at all  . Food insecurity:    Worry: Never true    Inability: Never true  . Transportation needs:    Medical: No    Non-medical: No  Tobacco Use  . Smoking status: Former Smoker    Packs/day: 1.00    Years: 22.00    Pack years: 22.00    Types: Cigarettes    Start date: 03/08/1972    Last attempt to quit: 01/08/1994    Years since quitting: 24.7  . Smokeless tobacco: Never Used  . Tobacco comment: quit smoking 49yrs ago  Substance and Sexual Activity  . Alcohol use: No    Alcohol/week: 0.0 standard drinks  . Drug use: No  . Sexual activity: Not Currently    Birth control/protection: Surgical  Lifestyle  .  Physical activity:    Days per week: 3 days    Minutes per session: 40 min  . Stress: To some extent  Relationships  . Social connections:    Talks on phone: More than three times a week    Gets together: More than three times a week    Attends religious service: 1 to  4 times per year    Active member of club or organization: Yes    Attends meetings of clubs or organizations: 1 to 4 times per year    Relationship status: Divorced  Other Topics Concern  . Not on file  Social History Narrative   Right handed, Caffeine none, Divorced, 2 kids,  PT - Housing auth in [place],   13.5 yrs school    Tobacco Counseling Counseling given: Not Answered Comment: quit smoking 23yrs ago  Activities of Daily Living In your present state of health, do you have any difficulty performing the following activities: 10/06/2018 09/18/2018  Hearing? Y N  Vision? Y Y  Difficulty concentrating or making decisions? Tempie Donning  Walking or climbing stairs? Y Y  Dressing or bathing? N N  Doing errands, shopping? N N  Preparing Food and eating ? N N  Using the Toilet? N N  In the past six months, have you accidently leaked urine? N N  Do you have problems with loss of bowel control? N N  Managing your Medications? N N  Managing your Finances? N N  Housekeeping or managing your Housekeeping? Tempie Donning  Some recent data might be hidden     Immunizations and Health Maintenance Immunization History  Administered Date(s) Administered  . Influenza-Unspecified 09/18/2014, 09/18/2017   Health Maintenance Due  Topic Date Due  . Hepatitis C Screening  1951-06-07    Patient Care Team: [name], MD as PCP - General (Internal Medicine) [place], Maree Erie, LCSW as [place] any recent [place] you may have received from other than [facility] providers in the past year (date may be approximate).     Assessment:   This is a routine wellness examination for Era.  Physical assessment deferred to PCP.   Hearing/Vision screen Hearing Screening Comments: HOH, states Tinnitus and states she has f/u with audiologist through [place]. Vision Screening Comments: Blindness left eye and reports poor vision in the right.  Dr. Kathlen Mody  Dietary issues and exercise activities discussed: Current Exercise Habits: The patient does not participate in regular exercise at present, Exercise limited by: Other - see comments( vision and pyschological issues)  Diet (meal preparation, eat out, water intake, caffeinated beverages, dairy products, fruits and vegetables): in general, a "healthy" diet   . Reports poor appetite at times  Reviewed heart healthy diet. Encouraged patient to increase daily water and healthy fluid intake.  Discussed supplementing with Ensure.   Goals   None    Depression Screen PHQ 2/9 Scores 10/06/2018 09/18/2018 09/14/2018 08/15/2018 06/02/2015  PHQ - 2 Score 6 6 6  - 0  PHQ- 9 Score 16 20 20  - -  Exception Documentation - - - Patient refusal -    Fall Risk Fall Risk  10/06/2018 09/18/2018 09/14/2018 11/08/2016 06/02/2015  Falls in the past year? Yes Yes Yes Yes No  Number falls in past yr: 1 1 1 1  -  Injury with Fall? No No No - -  Risk for fall due to : Impaired vision;Impaired balance/gait - - - -  Follow up Falls prevention discussed Education provided;Falls prevention discussed - - -   Cognitive Function:       Ad8 score reviewed for issues:  Issues making decisions: no  Less interest in hobbies / activities: no  Repeats questions, stories (family complaining): no  Trouble using ordinary gadgets (microwave, computer, phone):no  Forgets the month or year: no  Mismanaging finances: no  Remembering appts:  yes  Daily problems with thinking and/or memory: yes Ad8 score is= 2  Screening Tests Health Maintenance  Topic Date Due  . Hepatitis C Screening  1951/12/09  . INFLUENZA VACCINE  03/28/2019 (Originally 07/27/2018)  .  TETANUS/TDAP  09/15/2019 (Originally 07/03/1970)  . PNA vac Low Risk Adult (1 of 2 - PCV13) 09/15/2019 (Originally 07/03/2016)  . MAMMOGRAM  03/16/2020  . COLONOSCOPY  10/04/2023  . DEXA SCAN  Completed     Plan:   Continue doing brain stimulating activities (puzzles, reading, adult coloring books, staying active) to keep memory sharp.   Continue to eat heart healthy diet (full of fruits, vegetables, whole grains, lean protein, water--limit salt, fat, and sugar intake) and increase physical activity as tolerated.  I have personally reviewed and noted the following in the patient's chart:   . Medical and social history . Use of alcohol, tobacco or illicit drugs  . Current medications and supplements . Functional ability and status . Nutritional status . Physical activity . Advanced directives . List of other physicians . Vitals . Screenings to include cognitive, depression, and falls . Referrals and appointments  In addition, I have reviewed and discussed with patient certain preventive protocols, quality metrics, and best practice recommendations. A written personalized care plan for preventive services as well as general preventive health recommendations were provided to patient.     Michiel Cowboy, RN   10/06/2018

## 2018-10-06 NOTE — Addendum Note (Signed)
Addended by: Pricilla Holm A on: 10/06/2018 04:32 PM   Modules accepted: Orders

## 2018-10-06 NOTE — Patient Instructions (Addendum)
Continue doing brain stimulating activities (puzzles, reading, adult coloring books, staying active) to keep memory sharp.   Continue to eat heart healthy diet (full of fruits, vegetables, whole grains, lean protein, water--limit salt, fat, and sugar intake) and increase physical activity as tolerated.  Health Maintenance, Female Adopting a healthy lifestyle and getting preventive care can go a long way to promote health and wellness. Talk with your health care provider about what schedule of regular examinations is right for you. This is a good chance for you to check in with your provider about disease prevention and staying healthy. In between checkups, there are plenty of things you can do on your own. Experts have done a lot of research about which lifestyle changes and preventive measures are most likely to keep you healthy. Ask your health care provider for more information. Weight and diet Eat a healthy diet  Be sure to include plenty of vegetables, fruits, low-fat dairy products, and lean protein.  Do not eat a lot of foods high in solid fats, added sugars, or salt.  Get regular exercise. This is one of the most important things you can do for your health. ? Most adults should exercise for at least 150 minutes each week. The exercise should increase your heart rate and make you sweat (moderate-intensity exercise). ? Most adults should also do strengthening exercises at least twice a week. This is in addition to the moderate-intensity exercise.  Maintain a healthy weight  Body mass index (BMI) is a measurement that can be used to identify possible weight problems. It estimates body fat based on height and weight. Your health care provider can help determine your BMI and help you achieve or maintain a healthy weight.  For females 40 years of age and older: ? A BMI below 18.5 is considered underweight. ? A BMI of 18.5 to 24.9 is normal. ? A BMI of 25 to 29.9 is considered  overweight. ? A BMI of 30 and above is considered obese.  Watch levels of cholesterol and blood lipids  You should start having your blood tested for lipids and cholesterol at 67 years of age, then have this test every 5 years.  You may need to have your cholesterol levels checked more often if: ? Your lipid or cholesterol levels are high. ? You are older than 67 years of age. ? You are at high risk for heart disease.  Cancer screening Lung Cancer  Lung cancer screening is recommended for adults 53-15 years old who are at high risk for lung cancer because of a history of smoking.  A yearly low-dose CT scan of the lungs is recommended for people who: ? Currently smoke. ? Have quit within the past 15 years. ? Have at least a 30-pack-year history of smoking. A pack year is smoking an average of one pack of cigarettes a day for 1 year.  Yearly screening should continue until it has been 15 years since you quit.  Yearly screening should stop if you develop a health problem that would prevent you from having lung cancer treatment.  Breast Cancer  Practice breast self-awareness. This means understanding how your breasts normally appear and feel.  It also means doing regular breast self-exams. Let your health care provider know about any changes, no matter how small.  If you are in your 20s or 30s, you should have a clinical breast exam (CBE) by a health care provider every 1-3 years as part of a regular health exam.  If  you are 40 or older, have a CBE every year. Also consider having a breast X-ray (mammogram) every year.  If you have a family history of breast cancer, talk to your health care provider about genetic screening.  If you are at high risk for breast cancer, talk to your health care provider about having an MRI and a mammogram every year.  Breast cancer gene (BRCA) assessment is recommended for women who have family members with BRCA-related cancers. BRCA-related cancers  include: ? Breast. ? Ovarian. ? Tubal. ? Peritoneal cancers.  Results of the assessment will determine the need for genetic counseling and BRCA1 and BRCA2 testing.  Cervical Cancer Your health care provider may recommend that you be screened regularly for cancer of the pelvic organs (ovaries, uterus, and vagina). This screening involves a pelvic examination, including checking for microscopic changes to the surface of your cervix (Pap test). You may be encouraged to have this screening done every 3 years, beginning at age 34.  For women ages 5-65, health care providers may recommend pelvic exams and Pap testing every 3 years, or they may recommend the Pap and pelvic exam, combined with testing for human papilloma virus (HPV), every 5 years. Some types of HPV increase your risk of cervical cancer. Testing for HPV may also be done on women of any age with unclear Pap test results.  Other health care providers may not recommend any screening for nonpregnant women who are considered low risk for pelvic cancer and who do not have symptoms. Ask your health care provider if a screening pelvic exam is right for you.  If you have had past treatment for cervical cancer or a condition that could lead to cancer, you need Pap tests and screening for cancer for at least 20 years after your treatment. If Pap tests have been discontinued, your risk factors (such as having a new sexual partner) need to be reassessed to determine if screening should resume. Some women have medical problems that increase the chance of getting cervical cancer. In these cases, your health care provider may recommend more frequent screening and Pap tests.  Colorectal Cancer  This type of cancer can be detected and often prevented.  Routine colorectal cancer screening usually begins at 67 years of age and continues through 67 years of age.  Your health care provider may recommend screening at an earlier age if you have risk factors  for colon cancer.  Your health care provider may also recommend using home test kits to check for hidden blood in the stool.  A small camera at the end of a tube can be used to examine your colon directly (sigmoidoscopy or colonoscopy). This is done to check for the earliest forms of colorectal cancer.  Routine screening usually begins at age 70.  Direct examination of the colon should be repeated every 5-10 years through 67 years of age. However, you may need to be screened more often if early forms of precancerous polyps or small growths are found.  Skin Cancer  Check your skin from head to toe regularly.  Tell your health care provider about any new moles or changes in moles, especially if there is a change in a mole's shape or color.  Also tell your health care provider if you have a mole that is larger than the size of a pencil eraser.  Always use sunscreen. Apply sunscreen liberally and repeatedly throughout the day.  Protect yourself by wearing long sleeves, pants, a wide-brimmed hat, and sunglasses  whenever you are outside.  Heart disease, diabetes, and high blood pressure  High blood pressure causes heart disease and increases the risk of stroke. High blood pressure is more likely to develop in: ? People who have blood pressure in the high end of the normal range (130-139/85-89 mm Hg). ? People who are overweight or obese. ? People who are African American.  If you are 64-28 years of age, have your blood pressure checked every 3-5 years. If you are 58 years of age or older, have your blood pressure checked every year. You should have your blood pressure measured twice-once when you are at a hospital or clinic, and once when you are not at a hospital or clinic. Record the average of the two measurements. To check your blood pressure when you are not at a hospital or clinic, you can use: ? An automated blood pressure machine at a pharmacy. ? A home blood pressure monitor.  If  you are between 46 years and 42 years old, ask your health care provider if you should take aspirin to prevent strokes.  Have regular diabetes screenings. This involves taking a blood sample to check your fasting blood sugar level. ? If you are at a normal weight and have a low risk for diabetes, have this test once every three years after 67 years of age. ? If you are overweight and have a high risk for diabetes, consider being tested at a younger age or more often. Preventing infection Hepatitis B  If you have a higher risk for hepatitis B, you should be screened for this virus. You are considered at high risk for hepatitis B if: ? You were born in a country where hepatitis B is common. Ask your health care provider which countries are considered high risk. ? Your parents were born in a high-risk country, and you have not been immunized against hepatitis B (hepatitis B vaccine). ? You have HIV or AIDS. ? You use needles to inject street drugs. ? You live with someone who has hepatitis B. ? You have had sex with someone who has hepatitis B. ? You get hemodialysis treatment. ? You take certain medicines for conditions, including cancer, organ transplantation, and autoimmune conditions.  Hepatitis C  Blood testing is recommended for: ? Everyone born from [birth year] through 1965. ? Anyone with known risk factors for hepatitis C.  Sexually transmitted infections (STIs)  You should be screened for sexually transmitted infections (STIs) including gonorrhea and chlamydia if: ? You are sexually active and are younger than 67 years of age. ? You are older than 67 years of age and your health care provider tells you that you are at risk for this type of infection. ? Your sexual activity has changed since you were last screened and you are at an increased risk for chlamydia or gonorrhea. Ask your health care provider if you are at risk.  If you do not have HIV, but are at risk, it may be recommended  that you take a prescription medicine daily to prevent HIV infection. This is called pre-exposure prophylaxis (PrEP). You are considered at risk if: ? You are sexually active and do not regularly use condoms or know the HIV status of your partner(s). ? You take drugs by injection. ? You are sexually active with a partner who has HIV.  Talk with your health care provider about whether you are at high risk of being infected with HIV. If you choose to begin PrEP, you should  first be tested for HIV. You should then be tested every 3 months for as long as you are taking PrEP. Pregnancy  If you are premenopausal and you may become pregnant, ask your health care provider about preconception counseling.  If you may become pregnant, take 400 to 800 micrograms (mcg) of folic acid every day.  If you want to prevent pregnancy, talk to your health care provider about birth control (contraception). Osteoporosis and menopause  Osteoporosis is a disease in which the bones lose minerals and strength with aging. This can result in serious bone fractures. Your risk for osteoporosis can be identified using a bone density scan.  If you are 65 years of age or older, or if you are at risk for osteoporosis and fractures, ask your health care provider if you should be screened.  Ask your health care provider whether you should take a calcium or vitamin D supplement to lower your risk for osteoporosis.  Menopause may have certain physical symptoms and risks.  Hormone replacement therapy may reduce some of these symptoms and risks. Talk to your health care provider about whether hormone replacement therapy is right for you. Follow these instructions at home:  Schedule regular health, dental, and eye exams.  Stay current with your immunizations.  Do not use any tobacco products including cigarettes, chewing tobacco, or electronic cigarettes.  If you are pregnant, do not drink alcohol.  If you are  breastfeeding, limit how much and how often you drink alcohol.  Limit alcohol intake to no more than 1 drink per day for nonpregnant women. One drink equals 12 ounces of beer, 5 ounces of wine, or 1 ounces of hard liquor.  Do not use street drugs.  Do not share needles.  Ask your health care provider for help if you need support or information about quitting drugs.  Tell your health care provider if you often feel depressed.  Tell your health care provider if you have ever been abused or do not feel safe at home. This information is not intended to replace advice given to you by your health care provider. Make sure you discuss any questions you have with your health care provider. Document Released: 06/28/2011 Document Revised: 05/20/2016 Document Reviewed: 09/16/2015 Elsevier Interactive Patient Education  2018 [employer]  

## 2018-10-06 NOTE — Progress Notes (Signed)
Medical screening examination/treatment/procedure(s) were performed by non-physician practitioner and as supervising physician I was immediately available for consultation/collaboration. I agree with above. Elizabeth A Crawford, MD 

## 2018-10-06 NOTE — Assessment & Plan Note (Signed)
Flu shot declines. Pneumonia declines. Shingrix declines. Tetanus declines. Colonoscopy up to date per patient. Mammogram up to date per patient, pap smear aged out and dexa up to date. Counseled about sun safety and mole surveillance. Counseled about the dangers of distracted driving. Given 10 year screening recommendations.

## 2018-10-06 NOTE — Patient Instructions (Signed)

## 2018-10-06 NOTE — Telephone Encounter (Signed)
Copied from [place] [id number]. Topic: Quick Communication - See Telephone Encounter >> Oct 06, 2018  3:18 PM Antonieta Iba C wrote: CRM for notification. See Telephone encounter for: 10/06/18.  Pt says that she was just seen. Provider stated that she is going to send in a Rx to pharmacy (pt isnt sure of the name) pt declined in office but is now requesting Rx.  [id number] [place], [place]. [phone] (Phone) [phone] (Fax)

## 2018-10-06 NOTE — Progress Notes (Signed)
   Subjective:    Patient ID: Gabrielle [name], female    DOB: 1951-09-11, 67 y.o.   MRN: [id number]  HPI The patient is a 67 YO female coming in for physical. Still adjusting after eye surgery with loss of vision left eye.   PMH, [facility], social history reviewed and updated.   Review of Systems  Constitutional: Positive for activity change.  HENT: Negative.   Eyes: Positive for discharge and visual disturbance.  Respiratory: Negative for cough, chest tightness and shortness of breath.   Cardiovascular: Negative for chest pain, palpitations and leg swelling.  Gastrointestinal: Negative for abdominal distention, abdominal pain, constipation, diarrhea, nausea and vomiting.  Musculoskeletal: Negative.   Skin: Negative.   Neurological: Negative.   Psychiatric/Behavioral: Positive for decreased concentration, dysphoric mood and sleep disturbance. The patient is nervous/anxious.       Objective:   Physical Exam  Constitutional: She is oriented to person, place, and time. She appears well-developed and well-nourished.  HENT:  Head: Normocephalic and atraumatic.  Eyes: EOM are normal.  Neck: Normal range of motion.  Cardiovascular: Normal rate and regular rhythm.  Pulmonary/Chest: Effort normal and breath sounds normal. No respiratory distress. She has no wheezes. She has no rales.  Abdominal: Soft. Bowel sounds are normal. She exhibits no distension. There is no tenderness. There is no rebound.  Musculoskeletal: She exhibits no edema.  Neurological: She is alert and oriented to person, place, and time. Coordination normal.  Skin: Skin is warm and dry.  Psychiatric:  Appropriately distraught when talking about loss of vision   Vitals:   10/06/18 1336  BP: 120/80  Pulse: (!) 59  Temp: 97.7 F (36.5 C)  TempSrc: Oral  SpO2: 98%  Weight: 219 lb (99.3 kg)  Height: 5\' 4"  (1.626 m)      Assessment & Plan:

## 2018-10-06 NOTE — Telephone Encounter (Signed)
Patient called to see if doctor called in her sleep medication.  Patient stated that she does not want to go the whole weekend without sleeping . Please advise and call patient at [phone].

## 2018-10-09 ENCOUNTER — Other Ambulatory Visit: Payer: Self-pay | Admitting: Internal Medicine

## 2018-10-09 MED ORDER — SULFAMETHOXAZOLE-TRIMETHOPRIM 800-160 MG PO TABS: 1.0000 | ORAL_TABLET | Freq: Two times a day (BID) | ORAL | 0 refills | Status: DC

## 2018-10-10 ENCOUNTER — Telehealth: Payer: Self-pay | Admitting: Internal Medicine

## 2018-10-10 ENCOUNTER — Telehealth: Payer: Self-pay

## 2018-10-10 NOTE — Telephone Encounter (Signed)
Patient informed and stated understanding.

## 2018-10-10 NOTE — Telephone Encounter (Signed)
Copied from [place] [id number]. Topic: Quick Communication - See Telephone Encounter >> Oct 10, 2018 10:22 AM Marja Kays F wrote: Pt is calling for lab results from  10/06/18 best number to reach is [phone]

## 2018-10-10 NOTE — Telephone Encounter (Signed)
Bactrim will be fine to take with vision. Let's wait on trazodone for now.

## 2018-10-10 NOTE — Telephone Encounter (Signed)
Called patient To inform of lab results and patient wants to make sure that the bactrim will not effect her narrow angle glaucoma is worried about taking anything that would make it worse. Also states that she looked up the trazodone that was given and it said that it could cause angle closer and is wanting to know what your thoughts are. Is it okay for her to take?

## 2018-10-10 NOTE — Telephone Encounter (Signed)
Copied from [place] [phone]. Topic: General - Other >> Oct 10, 2018 10:08 AM Marin Olp L wrote: Reason for CRM: Patient would like a call back to review lab results.

## 2018-10-11 ENCOUNTER — Telehealth: Payer: Self-pay | Admitting: Internal Medicine

## 2018-10-11 DIAGNOSIS — E781 Pure hyperglyceridemia: Secondary | ICD-10-CM

## 2018-10-11 DIAGNOSIS — M316 Other giant cell arteritis: Secondary | ICD-10-CM | POA: Diagnosis not present

## 2018-10-11 NOTE — Telephone Encounter (Signed)
Copied from [place] [id number]. Topic: Quick Communication - See Telephone Encounter >> Oct 10, 2018 10:22 AM Marja Kays F wrote: Pt is calling for lab results from  10/06/18 best number to reach is [phone] >> Oct 11, 2018  3:04 PM Carolyn Stare wrote:   Pt said she have her lab results she is not able to understand her Cholesterol numbers and would like a call back to discuss     [phone]

## 2018-10-12 DIAGNOSIS — H16223 Keratoconjunctivitis sicca, not specified as Sjogren's, bilateral: Secondary | ICD-10-CM | POA: Diagnosis not present

## 2018-10-12 DIAGNOSIS — H40051 Ocular hypertension, right eye: Secondary | ICD-10-CM | POA: Diagnosis not present

## 2018-10-12 DIAGNOSIS — H40033 Anatomical narrow angle, bilateral: Secondary | ICD-10-CM | POA: Diagnosis not present

## 2018-10-12 DIAGNOSIS — H04123 Dry eye syndrome of bilateral lacrimal glands: Secondary | ICD-10-CM | POA: Diagnosis not present

## 2018-10-12 NOTE — Telephone Encounter (Signed)
Patient informed. 

## 2018-10-12 NOTE — Telephone Encounter (Signed)
Labs explained again to patient, patient would like to know when she can come back a fasting lipid panel to get a more accurate reading.

## 2018-10-12 NOTE — Telephone Encounter (Signed)
Come any day, lab opens at 7:30

## 2018-10-12 NOTE — Telephone Encounter (Signed)
Patient is concerned Cholesterol numbers a showing high but was told normal. Please advise. Thank you

## 2018-10-12 NOTE — Telephone Encounter (Signed)
LVM informing patient of MD response  

## 2018-10-12 NOTE — Telephone Encounter (Signed)
Triglycerides were high but not fasting so not abnormal. The LDL is at goal and HDL at goal so cholesterol is normal.

## 2018-10-13 ENCOUNTER — Telehealth: Payer: Self-pay

## 2018-10-13 ENCOUNTER — Encounter: Payer: Self-pay | Admitting: *Deleted

## 2018-10-13 ENCOUNTER — Other Ambulatory Visit: Payer: Self-pay | Admitting: *Deleted

## 2018-10-13 MED ORDER — SUVOREXANT 15 MG PO TABS: 15.0000 mg | ORAL_TABLET | Freq: Every day | ORAL | 3 refills | Status: DC

## 2018-10-13 MED ORDER — SUCRALFATE 1 GM/10ML PO SUSP: 1.0000 g | Freq: Three times a day (TID) | ORAL | 0 refills | Status: DC

## 2018-10-13 NOTE — Telephone Encounter (Signed)
Printed rx. I would not take the trazodone.

## 2018-10-13 NOTE — Telephone Encounter (Signed)
Faxed Rx

## 2018-10-13 NOTE — Addendum Note (Signed)
Addended by: Pricilla Holm A on: 10/13/2018 11:01 AM   Modules accepted: Orders

## 2018-10-13 NOTE — Telephone Encounter (Signed)
Called patient to informe Rx was sent and to no do the Trazodone. Patient wants ot know if there is anything safe for her to take with her eye problems that could help her sleep

## 2018-10-13 NOTE — Telephone Encounter (Signed)
Pt called and stated that Armodafinil and madafinil are alternatives. Pt also states that she will need a PA. Please advise

## 2018-10-13 NOTE — Telephone Encounter (Signed)
Sent in [place] to take instead. Safe to take with ambien.

## 2018-10-13 NOTE — Addendum Note (Signed)
Addended by: Pricilla Holm A on: 10/13/2018 12:43 PM   Modules accepted: Orders

## 2018-10-13 NOTE — Telephone Encounter (Signed)
Patient was told by the [place] that her PCP would have to do a written Rx for the carafate liquid suspension and what it is for in order for it to be filled at [facility] so patient can get it cheaper.  Patient is also wondering how long she would need to hold off on the trazadone. Patient has been up since since 3 am.

## 2018-10-13 NOTE — Telephone Encounter (Signed)
Patient informed of rx that was sent states that it was 95 dollars and wanted to call her insurance to see if there was an alternative to belsomra that was cheaper and if there was I can let Dr. Sharlet Salina know and she can let us know if it is appropriate with patients current med list and eye pressure problems.

## 2018-10-13 NOTE — Telephone Encounter (Signed)
Copied from [place] [phone]. Topic: General - Other >> Oct 13, 2018  8:43 AM Yvette Rack wrote: Reason for CRM: pt calling to speak with Dr Sharlet Salina assistant [name] she would like for her to fax over the Sucralfate (CARAFATE PO) [facility] to [facility] Attn: Dr Marjo Bicker (F) [phone]  she would like a call back so that she can tell the assistant [name] what to put on RX

## 2018-10-13 NOTE — Patient Outreach (Signed)
[facility]) Care Management  10/13/2018  LOLA CZERWONKA June 24, 1951 [id number]   CSW made an attempt to try and contact patient today to follow-up regarding social work services and resources; however, patient was not available at the time of CSW's call.  A HIPAA compliant message was left for patient on voicemail.  CSW is currently awaiting a return call.  CSW will make a second outreach attempt within the next 3-4 business days, if a return call is not received from patient in the meantime.  CSW will also mail an Outreach Letter to patient's home requesting that patient contact CSW if patient is interested in receiving social work services through [place] with Scientist, clinical (histocompatibility and immunogenetics). [name], BSW, MSW, LCSW  Licensed Education officer, environmental Health System  Mailing [place] [address], [place], [place] [address] Physical Address-[address], [place], [place] [address] Toll Free Main # [phone] Fax # [phone] Cell # [phone]  Office # [phone] [email or web address]

## 2018-10-16 NOTE — Telephone Encounter (Signed)
Patient states that those are alternatives for belsomra, but I think these are to help people stay awake?

## 2018-10-16 NOTE — Telephone Encounter (Signed)
FYI from my last message I just received a PA for belsomra I am going to do that and see if it gets covered.

## 2018-10-16 NOTE — Telephone Encounter (Signed)
Patient is requesting to speak with Gabrielle [name]. She states it is in regards to her medications. She does not understand the PA and wants to just talk with [name].   CB# [phone]

## 2018-10-16 NOTE — Telephone Encounter (Signed)
Patient states the ABX that she was given made her really nauseas and her ear to ring so bad. Stopped taking medication wants to know if there is anything else that she can take to clear the UTI that wont effect her eye pressure. States she has been on keflex.   Patient also states that if we fax a written Rx for the belsomra to the [place] they can get it covered. Fax to [phone] Attention Dr. Marjo Bicker and Sharyn Lull

## 2018-10-17 ENCOUNTER — Telehealth: Payer: Self-pay | Admitting: Internal Medicine

## 2018-10-17 DIAGNOSIS — H16223 Keratoconjunctivitis sicca, not specified as Sjogren's, bilateral: Secondary | ICD-10-CM | POA: Diagnosis not present

## 2018-10-17 DIAGNOSIS — H40053 Ocular hypertension, bilateral: Secondary | ICD-10-CM | POA: Diagnosis not present

## 2018-10-17 DIAGNOSIS — H25813 Combined forms of age-related cataract, bilateral: Secondary | ICD-10-CM | POA: Diagnosis not present

## 2018-10-17 DIAGNOSIS — H4720 Unspecified optic atrophy: Secondary | ICD-10-CM | POA: Diagnosis not present

## 2018-10-17 DIAGNOSIS — H40033 Anatomical narrow angle, bilateral: Secondary | ICD-10-CM | POA: Diagnosis not present

## 2018-10-17 MED ORDER — CEPHALEXIN 500 MG PO CAPS: 500.0000 mg | ORAL_CAPSULE | Freq: Two times a day (BID) | ORAL | 0 refills | Status: AC

## 2018-10-17 MED ORDER — SUVOREXANT 15 MG PO TABS: 15.0000 mg | ORAL_TABLET | Freq: Every day | ORAL | 3 refills | Status: DC

## 2018-10-17 NOTE — Telephone Encounter (Signed)
I will do the paper work today, but patient is going to get through the [place] for the time being

## 2018-10-17 NOTE — Telephone Encounter (Signed)
Copied from [place] [phone]. Topic: Quick Communication - See Telephone Encounter >> Oct 17, 2018  9:09 AM Cecelia Byars, NT wrote: CRM for notification. See Telephone encounter for: 10/17/18. [facility] called and would like information for a tier acceptance  for belsomra , please call [phone] ref # [id number]

## 2018-10-17 NOTE — Telephone Encounter (Signed)
The antibiotic bactrim was a long time ago. Did she finish or how much did she take? May not need another agent depending. Printed script for belsomra.

## 2018-10-17 NOTE — Addendum Note (Signed)
Addended by: Pricilla Holm A on: 10/17/2018 02:16 PM   Modules accepted: Orders

## 2018-10-17 NOTE — Addendum Note (Signed)
Addended by: Pricilla Holm A on: 10/17/2018 09:18 AM   Modules accepted: Orders

## 2018-10-17 NOTE — Telephone Encounter (Signed)
Patient is experiencing odor in urine and needing to urinate often

## 2018-10-17 NOTE — Telephone Encounter (Signed)
LVM informing patient that medication was sent in and that I will call her back when I hear about the PA for the Belsomra

## 2018-10-17 NOTE — Telephone Encounter (Signed)
Patient only took two pills before stopping the ABX

## 2018-10-17 NOTE — Telephone Encounter (Signed)
If symptoms are gone then she may not need more antibiotics.

## 2018-10-17 NOTE — Telephone Encounter (Signed)
Sent in keflex 1 pill twice a day for 5 days.

## 2018-10-18 ENCOUNTER — Telehealth: Payer: Self-pay

## 2018-10-18 NOTE — Telephone Encounter (Signed)
Pt has not picked up medication yet. She is concerned about possible drug interactions with other medications and requesting call from [facility] please.  Call back # [phone]

## 2018-10-18 NOTE — Telephone Encounter (Signed)
Is there any interactions patient should be worried about between keflex and current medications

## 2018-10-18 NOTE — Telephone Encounter (Signed)
PA for belsomra was approved from 10/17/2018-12/27/2019

## 2018-10-18 NOTE — Telephone Encounter (Signed)
Yes, it is okay to take with these medications.

## 2018-10-18 NOTE — Telephone Encounter (Signed)
No, should be fine.

## 2018-10-18 NOTE — Telephone Encounter (Signed)
Informed Patient of MD response patient wants to know if the belsomra is safe to take with the xanax. States she takes a xanax and Ambien at night

## 2018-10-18 NOTE — Telephone Encounter (Signed)
Patient informed of MD response and informed that the PA went though. Medication cost is down to 45 dollars is only filling a small amount of pills for 15 dollars to try it out and see if it works.

## 2018-10-19 ENCOUNTER — Encounter: Payer: Self-pay | Admitting: *Deleted

## 2018-10-19 ENCOUNTER — Other Ambulatory Visit: Payer: Self-pay | Admitting: *Deleted

## 2018-10-19 DIAGNOSIS — K219 Gastro-esophageal reflux disease without esophagitis: Secondary | ICD-10-CM | POA: Diagnosis not present

## 2018-10-19 DIAGNOSIS — Z800 Family history of malignant neoplasm of digestive organs: Secondary | ICD-10-CM | POA: Diagnosis not present

## 2018-10-19 DIAGNOSIS — K5904 Chronic idiopathic constipation: Secondary | ICD-10-CM | POA: Diagnosis not present

## 2018-10-19 DIAGNOSIS — K5730 Diverticulosis of large intestine without perforation or abscess without bleeding: Secondary | ICD-10-CM | POA: Diagnosis not present

## 2018-10-19 NOTE — Patient Outreach (Signed)
[facility]) Care Management  10/19/2018  Gabrielle [name] [birth year] [id number]   CSW made a second attempt to try and contact patient today to perform phone assessment, as well as assess and assist with social work needs and services, without success.  A HIPAA compliant message was left for patient on voicemail.  CSW continues to await a return call.  CSW will make a third and final outreach attempt within the next 3-4 business days, if a return call is not received from patient in the meantime.  CSW will then proceed with case closure if a return call is not received from patient with a total of 10 business days, as required number of phone attempts will have been made and outreach letter mailed.  [name], BSW, MSW, LCSW  Licensed Education officer, environmental Health System  Mailing [place] [address] [place], [place] [address] Physical Address-[address], [place], [place] [address] Toll Free Main # [phone] Fax # [phone] Cell # [phone]  Office # [phone] [email or web address]

## 2018-10-25 ENCOUNTER — Other Ambulatory Visit: Payer: Self-pay | Admitting: *Deleted

## 2018-10-25 DIAGNOSIS — H40053 Ocular hypertension, bilateral: Secondary | ICD-10-CM | POA: Diagnosis not present

## 2018-10-25 DIAGNOSIS — H25813 Combined forms of age-related cataract, bilateral: Secondary | ICD-10-CM | POA: Diagnosis not present

## 2018-10-25 DIAGNOSIS — H4720 Unspecified optic atrophy: Secondary | ICD-10-CM | POA: Diagnosis not present

## 2018-10-25 DIAGNOSIS — H16223 Keratoconjunctivitis sicca, not specified as Sjogren's, bilateral: Secondary | ICD-10-CM | POA: Diagnosis not present

## 2018-10-25 DIAGNOSIS — H40033 Anatomical narrow angle, bilateral: Secondary | ICD-10-CM | POA: Diagnosis not present

## 2018-10-25 NOTE — Patient Outreach (Signed)
[facility]) Care Management  10/25/2018  Gabrielle [name] [birth year] [id number]    CSW made a third and final attempt to try and contact patient today to perform phone assessment, as well as assess and assist with social work needs and services, without success.  A HIPAA compliant message was left for patient on voicemail.  CSW is currently awaiting a return call.  CSW will proceed with case closure in two business days, if a return call is not received in the meantime, as required number of phone attempts have been made and an outreach letter was mailed to patient's home allowing 10 business days for a response. [facility] CM Care Plan Problem One     Most Recent Value  Care Plan Problem One  Patient is suffering from Anxiety, Depression and Post Traumatic Stress Disorder.  Role Documenting the Problem One  Clinical Social Worker  Care Plan for Problem One  Active  [facility] Long Term Goal   Patient will have a reduction in symptoms of Anxiety, Depression and Post Traumatic Stress Disorder, through counseling, supportive services and literature, within the next 45 days.  [facility] Long Term Goal Start Date  09/18/18  [facility] CM Short Term Goal #1   Patient will meet with CSW for an initial home visit, to receive counseling and supportive services, within the next three weeks.  [facility] CM Short Term Goal #1 Start Date  09/18/18  [facility] CM Short Term Goal #1 Met Date  10/05/18  [facility] CM Short Term Goal #2   Patient will review EMMI information provided to her regarding "Signs and Symptoms of Depression", provided to her by CSW, within the next three weeks.  [facility] CM Short Term Goal #2 Start Date  09/18/18  [facility] CM Short Term Goal #3  Patient will practice deep breathing exercises and relaxation techniques, taught to her by CSW, during the next 30 days.  [facility] CM Short Term Goal #3 Start Date  09/18/18     [name], BSW, MSW, [place]  Licensed Clinical Social Worker  [place]  Mailing [place]. [address] [place], [place] [address] Physical Address-[address], [place],  [address] Toll Free Main # [phone] Fax # [phone] Cell # [phone]  Office # [phone] [name]

## 2018-10-27 ENCOUNTER — Encounter: Payer: Self-pay | Admitting: *Deleted

## 2018-10-27 ENCOUNTER — Other Ambulatory Visit: Payer: Self-pay | Admitting: *Deleted

## 2018-10-27 NOTE — Patient Outreach (Signed)
[facility]) Care Management  10/27/2018  VENTURA LEGGITT 07-Jun-1951 [id number]   CSW will perform a case closure on patient, due to inability to maintain phone contact with patient, despite required number of phone attempts made and outreach letter mailed to patient's home allowing 10 days for a response if patient is interested in receiving social work services through [place] with [facility].  CSW will fax an update to patient's Primary Care Physician, Dr. Pricilla Holm to ensure that they are aware of CSW's involvement with patient's plan of care.   [name], BSW, MSW, LCSW  Licensed Education officer, environmental Health System  Mailing [place] [address] [place], [place] [address] Physical Address-[address], [place], [place] [address] Toll Free Main # [phone] Fax # [phone] Cell # [phone]  Office # [phone] [email or web address]

## 2018-10-30 DIAGNOSIS — K635 Polyp of colon: Secondary | ICD-10-CM | POA: Diagnosis not present

## 2018-10-30 DIAGNOSIS — Z1211 Encounter for screening for malignant neoplasm of colon: Secondary | ICD-10-CM | POA: Diagnosis not present

## 2018-10-30 DIAGNOSIS — Z800 Family history of malignant neoplasm of digestive organs: Secondary | ICD-10-CM | POA: Diagnosis not present

## 2018-10-30 DIAGNOSIS — D123 Benign neoplasm of transverse colon: Secondary | ICD-10-CM | POA: Diagnosis not present

## 2018-10-30 LAB — HM COLONOSCOPY

## 2018-11-01 DIAGNOSIS — H40033 Anatomical narrow angle, bilateral: Secondary | ICD-10-CM | POA: Diagnosis not present

## 2018-11-01 DIAGNOSIS — H04123 Dry eye syndrome of bilateral lacrimal glands: Secondary | ICD-10-CM | POA: Diagnosis not present

## 2018-11-03 ENCOUNTER — Telehealth: Payer: Self-pay | Admitting: Internal Medicine

## 2018-11-03 NOTE — Telephone Encounter (Signed)
Pt contacted regarding concerns; she says that the ambien and xanax (1 mg) allows her to sleep only 4-5 hours per night; she says that the belsomra has not helped; she what like to know what her alternatives are; also the pt for the pt to prescribe xanax "5" mg to be taken daily prn because she finds that it is necessary to cut a tablet in half and take it in addition to daily dose; [facility]; the pt can be contacted at [phone]; the pt was seen by Dr Pricilla Holm, [facility]; will route to office for final disposition.

## 2018-11-03 NOTE — Telephone Encounter (Signed)
Patient informed of MD response and stated understanding  

## 2018-11-03 NOTE — Telephone Encounter (Signed)
Belsomra is not working for patient and that it made her feel really woozy in the morning and did not like that feeling. Patient is wondering if there is an alternative that she could try that would not make her feel like that. Patient states that she take a half of xanax extra during the day only when anxiety gets up. Kept asking if she could get number 5 xanax? I think the patient is worried that she does need to take another half of her xanax during the day sometimes and will run out early. patient states she does not want it to seem like she is asking for more pills but is trying to find a solution so she wont run out.

## 2018-11-03 NOTE — Telephone Encounter (Signed)
We do not prescribe her xanax and she will have to call that provider to ask for increase on that. She could try benadryl over the counter to help with sleep instead.

## 2018-11-03 NOTE — Telephone Encounter (Signed)
Copied from [place] [id number]. Topic: General - Other >> Nov 03, 2018 11:45 AM Leward Quan A wrote: Reason for CRM: Patient request a call back regarding medication said she received Suvorexant (BELSOMRA) 15 MG TABS about a month ago and is not taking this medication. Request a call back to discuss this and other issues. Ph# [phone]

## 2018-11-07 ENCOUNTER — Encounter: Payer: Self-pay | Admitting: *Deleted

## 2018-11-07 ENCOUNTER — Other Ambulatory Visit: Payer: Self-pay | Admitting: *Deleted

## 2018-11-07 NOTE — Patient Outreach (Signed)
[facility]) Care Management  11/07/2018  Gabrielle [name] March 26, 1951 [id number]  CSW was able to make initial contact with patient today to perform phone assessment, as well as assess and assist with social work needs and services.  CSW introduced self, explained role and types of services provided through [facility] ([facility]).  CSW further explained to patient that CSW works with patient's Primary Care Physician, Dr. Pricilla Holm, also with [facility]. CSW then explained the reason for the call, indicating that Dr. Sharlet Salina thought that patient would benefit from social work services and resources to assist with counseling and supportive services for symptoms of anxiety and depression.  CSW obtained two HIPAA compliant identifiers from patient, which included patient's name and date of birth.  Patient reported that she already see's a Psychiatrist, [facility] Camille [facility], who administers her psychotropic medications, she just needs a therapist.  CSW agreed to offer counseling and supportive services to patient, on a short-term basis, and then refer patient to a therapist if continued services are needed.  Patient voiced understanding and was agreeable to this plan.  Patient indicated that she does not wish to burden her family members with all her medical problems and made this statement about her two daughters, "I don't bother my kids and they don't bother me".  An initial home visit has been scheduled with patient for Wednesday, November 15, 2018 at 11:00 AM at patient's home, per patient's request.  Patient admitted that her depression and anxiety started shortly after she had her most recent eye surgery, roughly about four months ago.  Patient indicated that she has lost vision in her left eye and now has distorted vision in her right eye, which she believes is a direct result of the surgery.  Patient voiced a  great deal of frustration regarding the surgery, believing that she would still be able to see if she had not had the procedure.  Patient is scheduled to meet with Dr. Fredrich Birks, Optometrist with [facility], on Friday, November 10, 2018, who is supposed to be a "dry eye specialist".  Patient is hoping that Dr. Margaretmary Dys will be able to determine why patient is experiencing dry eye, distorted vision and overall loss of vision in her left eye.  In talking with patient, she admitted that she would like to receive assistance from [place] with completing her Advanced Directives ([place]).  CSW agreed to provide patient with an Advanced Directives packet, as well as assist with completion.  CSW will also provide patient with transportation resources, as patient believes there will come a time, in the near future, when she will not be able to see to drive herself to and from her physician appointments.   Patient indicated that she has "finally" been approved for [employer] [facility] through [employer] with [facility].  Patient is entitled to receive CNA Optometrist) services 2 days per week for three hours per day.  CSW will print EMMI information for patient, pertaining specifically to "Signs and Symptoms of Depression", to review with patient during the initial home visit, to ensure understanding.  Initial home visit with patient has been scheduled for Wednesday, November 15, 2018 at 11:00 AM.  CSW will provide counseling and supportive services to patient, as well as teach patient proper use of deep breathing exercises and relaxation techniques.  CSW will also provide patient with a list  of therapists that accept her insurance ([employer]) and make a referral for services if patient believes that she needs continued counseling services once services have been terminated with CSW.  THN CM Care Plan Problem  One     Most Recent Value  Care Plan Problem One  Patient is suffering from Anxiety and Depression.  Role Documenting the Problem One  Clinical Social Worker  Care Plan for Problem One  Active  [facility] Long Term Goal   Patient will have a reduction in symptoms of Anxiety and Depression, through counseling, supportive services and literature, within the next 45 days.  THN Long Term Goal Start Date  11/07/18  Interventions for Problem One Long Term Goal  CSW will meet with patient in the home to provide counseling and supportive services.  THN CM Short Term Goal #1   Patient will meet with CSW for an initial home visit, to receive counseling and supportive services, within the next three weeks.  THN CM Short Term Goal #1 Start Date  11/07/18  Interventions for Short Term Goal #1  An initial home visit has been scheduled with patient for Wednesday, November 15, 2018 at 11:00 AM.  [facility] CM Short Term Goal #2   Patient will review EMMI information provided to her regarding "Signs and Symptoms of Depression" and voice understanding within the next 30 days.  THN CM Short Term Goal #2 Start Date  11/07/18  Interventions for Short Term Goal #2  CSW will print EMMI information for patient to provide to patient during the initial home visit.  THN CM Short Term Goal #3  Patient will practice deep breathing exercises and relaxation techniques to help cope with symptoms of depression and anxiety, taught by CSW, within the next 30 days.  THN CM Short Term Goal #3 Start Date  11/07/18  Interventions for Short Tern Goal #3  CSW will teach patient proper use for using deep breathing exercises and relaxation techniques.     [name], BSW, MSW, LCSW  Licensed Education officer, environmental Health System  Mailing [place] [address], [place], [place] [address] Physical Address-[address], [place], [place] [address] Toll Free Main # [phone] Fax #  [phone] Cell # [phone]  Office # [phone] [email or web address]

## 2018-11-15 ENCOUNTER — Other Ambulatory Visit: Payer: Self-pay | Admitting: *Deleted

## 2018-11-15 DIAGNOSIS — H04123 Dry eye syndrome of bilateral lacrimal glands: Secondary | ICD-10-CM | POA: Diagnosis not present

## 2018-11-15 DIAGNOSIS — M3501 Sicca syndrome with keratoconjunctivitis: Secondary | ICD-10-CM | POA: Diagnosis not present

## 2018-11-15 DIAGNOSIS — H469 Unspecified optic neuritis: Secondary | ICD-10-CM | POA: Diagnosis not present

## 2018-11-15 DIAGNOSIS — H25013 Cortical age-related cataract, bilateral: Secondary | ICD-10-CM | POA: Diagnosis not present

## 2018-11-15 DIAGNOSIS — H0288A Meibomian gland dysfunction right eye, upper and lower eyelids: Secondary | ICD-10-CM | POA: Diagnosis not present

## 2018-11-15 DIAGNOSIS — H2513 Age-related nuclear cataract, bilateral: Secondary | ICD-10-CM | POA: Diagnosis not present

## 2018-11-15 DIAGNOSIS — H0288B Meibomian gland dysfunction left eye, upper and lower eyelids: Secondary | ICD-10-CM | POA: Diagnosis not present

## 2018-11-15 NOTE — Patient Outreach (Signed)
[facility]) Care Management  11/15/2018  Gabrielle [name] 1951-08-07 [id number]   CSW drove out to patient's home today to try and perform the initial home visit, to provide counseling and supportive services for symptoms of anxiety and depression; however, when CSW arrived at patient's home, patient admitted that she had forgotten about the appointment, needing to reschedule.  Patient went on to say that she has an appointment in [place], [place] at noon today, needing to leave her house around 11:30 AM to arrive on time for the appointment.  Patient stated, "I haven't even had an opportunity to get ready yet".  CSW voiced understanding, agreeing to reschedule the initial home visit with patient at her convenience.  Patient indicated that she would need to call CSW back to reschedule the home visit, as she did not have her calendar in front of her and she was kind of in a rush.  CSW was able to ensure that patient has the correct contact information for CSW, explaining that CSW will be awaiting a return call.  CSW will attempt to outreach to patient on Tuesday, November 21, 2018 around 9:00 AM, if a return call is not received from patient in the meantime.  CSW left EMMI information for patient, pertaining specifically to "Signs and Symptoms of Depression" for her independent review.  CSW then agreed to discuss the information with patient during the initial home visit to ensure understanding.  CSW will also provide counseling and supportive services to patient to try and reduce symptoms of anxiety and depression, as well as teach patient proper use of deep breathing exercises and relaxation techniques.  CSW will talk with patient about getting her re-established with her psychiatrist at [employer].   THN CM Care Plan Problem One     Most Recent Value  Care Plan Problem One  Patient is suffering from Anxiety and Depression.  Role Documenting the Problem  One  Clinical Social Worker  Care Plan for Problem One  Active  [facility] Long Term Goal   Patient will have a reduction in symptoms of Anxiety and Depression, through counseling, supportive services and literature, within the next 45 days.  THN Long Term Goal Start Date  11/07/18  [facility] CM Short Term Goal #1   Patient will meet with CSW for an initial home visit, to receive counseling and supportive services, within the next three weeks.  THN CM Short Term Goal #1 Start Date  11/07/18  THN CM Short Term Goal #2   Patient will review EMMI information provided to her regarding "Signs and Symptoms of Depression" and voice understanding within the next 30 days.  THN CM Short Term Goal #2 Start Date  11/07/18  [facility] CM Short Term Goal #2 Met Date  11/15/18  Interventions for Short Term Goal #2  CSW provided patient with EMMI information pertaining to "Signs and Symptoms of Depression" for her independent review for CSW to thoroughly discuss with patient during the initial home visit to ensure understanding.  THN CM Short Term Goal #3  Patient will practice deep breathing exercises and relaxation techniques to help cope with symptoms of depression and anxiety, taught by CSW, within the next 30 days.  THN CM Short Term Goal #3 Start Date  11/07/18     [name], BSW, MSW, [place]  Licensed Clinical Social Worker  [place]  Mailing [place]. [address], [place], [place] [address] Physical Address-[address] [place],  [place], [place] [address] [place] # [phone] Fax # [phone] Cell # [phone]  Office # [phone] [email or web address]

## 2018-11-16 ENCOUNTER — Ambulatory Visit: Payer: Self-pay | Admitting: *Deleted

## 2018-11-16 NOTE — Telephone Encounter (Signed)
This is safe to take with other medications.

## 2018-11-16 NOTE — Telephone Encounter (Signed)
Patient informed of MD response and stated understanding. Patient still wondering if there is an alternative to belsomra she can take as it made her woozy when she took it

## 2018-11-16 NOTE — Telephone Encounter (Signed)
Message from Berneta Levins sent at 11/16/2018 9:48 AM EST   Summary: medication question   Pt states she was told by her eye doctor, Dr. Margaretmary Dys - dry eye specialist out of [facility] in [place] recommended that she take Nordic Natural Capsules Pro Omega take 1-2 every morning with food, but told her to check with her PCP first to make sure this was acceptable.         Patient wants to make sure that supplement is ok to take with her prescribed medication- would like PCP review before starting.

## 2018-11-17 NOTE — Telephone Encounter (Signed)
Tried calling patient, not sure if something is wrong with our phone or patients but it would not go through. Routing to [facility] incase patient calls back

## 2018-11-17 NOTE — Telephone Encounter (Signed)
There are not a lot of other medications which are safe with what she is already taking.

## 2018-11-21 ENCOUNTER — Other Ambulatory Visit: Payer: Self-pay | Admitting: *Deleted

## 2018-11-21 DIAGNOSIS — H25813 Combined forms of age-related cataract, bilateral: Secondary | ICD-10-CM | POA: Diagnosis not present

## 2018-11-21 DIAGNOSIS — H4720 Unspecified optic atrophy: Secondary | ICD-10-CM | POA: Diagnosis not present

## 2018-11-21 DIAGNOSIS — H40033 Anatomical narrow angle, bilateral: Secondary | ICD-10-CM | POA: Diagnosis not present

## 2018-11-21 DIAGNOSIS — H40053 Ocular hypertension, bilateral: Secondary | ICD-10-CM | POA: Diagnosis not present

## 2018-11-21 DIAGNOSIS — H16223 Keratoconjunctivitis sicca, not specified as Sjogren's, bilateral: Secondary | ICD-10-CM | POA: Diagnosis not present

## 2018-11-21 NOTE — Patient Outreach (Signed)
[facility]) Care Management  11/21/2018  PEGGI YONO Jul 23, 1951 [id number]   CSW was able to make contact with patient today to reschedule the initial home visit.  The initial home visit has been rescheduled to Thursday, November 30, 2018 at 12:00 PM.  CSW was able to ensure that patient has the correct contact information for CSW, encouraging patient to contact CSW directly if she needs to change or cancel the initial home visit. [name], BSW, MSW, LCSW  Licensed Education officer, environmental Health System  Mailing [place] [address] [place], [place] [address] Physical Address-[address], [place], [place] [address] Toll Free Main # [phone] Fax # [phone] Cell # [phone]  Office # [phone] [email or web address]

## 2018-11-27 DIAGNOSIS — I10 Essential (primary) hypertension: Secondary | ICD-10-CM | POA: Diagnosis not present

## 2018-11-27 DIAGNOSIS — F411 Generalized anxiety disorder: Secondary | ICD-10-CM | POA: Diagnosis not present

## 2018-11-27 DIAGNOSIS — D696 Thrombocytopenia, unspecified: Secondary | ICD-10-CM | POA: Diagnosis not present

## 2018-11-27 DIAGNOSIS — M545 Low back pain: Secondary | ICD-10-CM | POA: Diagnosis not present

## 2018-11-27 DIAGNOSIS — Z0000 Encounter for general adult medical examination without abnormal findings: Secondary | ICD-10-CM | POA: Diagnosis not present

## 2018-11-27 DIAGNOSIS — E785 Hyperlipidemia, unspecified: Secondary | ICD-10-CM | POA: Diagnosis not present

## 2018-11-27 DIAGNOSIS — H9319 Tinnitus, unspecified ear: Secondary | ICD-10-CM | POA: Diagnosis not present

## 2018-11-29 DIAGNOSIS — H25813 Combined forms of age-related cataract, bilateral: Secondary | ICD-10-CM | POA: Diagnosis not present

## 2018-11-29 DIAGNOSIS — H47012 Ischemic optic neuropathy, left eye: Secondary | ICD-10-CM | POA: Diagnosis not present

## 2018-11-29 DIAGNOSIS — H40033 Anatomical narrow angle, bilateral: Secondary | ICD-10-CM | POA: Diagnosis not present

## 2018-11-29 DIAGNOSIS — H40053 Ocular hypertension, bilateral: Secondary | ICD-10-CM | POA: Diagnosis not present

## 2018-11-29 DIAGNOSIS — H16223 Keratoconjunctivitis sicca, not specified as Sjogren's, bilateral: Secondary | ICD-10-CM | POA: Diagnosis not present

## 2018-11-30 ENCOUNTER — Other Ambulatory Visit: Payer: Self-pay | Admitting: *Deleted

## 2018-11-30 NOTE — Patient Outreach (Signed)
[facility]) Care Management  11/30/2018  Gabrielle [name] 07/08/1951 [id number]   CSW was able to meet with patient today to perform the initial home visit.  Patient was lying in her bed, where patient reported that she spends the majority of her time now, upon CSW's arrival.  Patient became tearful several times throughout the visit, while talking about her "botched" eye surgery, for which CSW offered counseling and supportive services, as well as Cognitive Behavioral Therapy.  CSW also provided Person-Centered Therapy to patient, working to understand her individual experience from her perspective and helping her reconnect with her own inner values and sense of self-worth.  This will hopefully enable her to find her own way to move forward and progress.    Patient explained that she underwent a Blepharoplasty on June 27, 2018 and that she has lost almost all of her vision in her left eye, and that she is now experiencing blurred vision in her right eye, since having the procedure.  Patient stated, "I should have known that something was terribly wrong when I started experiencing severe pain, swelling, nausea and vision loss".  Patient blames herself for not having done more research on the procedure before having it performed, or at least having gotten a second opinion from a specialist.  Patient reported that the procedure was elective and it was simply to try and remove the "bags" from underneath her eyes.   Patient reported that she has been to several ophthalmologists, oculoplastic surgeons, ear, nose and throat surgeons and general surgeons at [facility], as well as [facility] and that no one can provide her with a clear answer as to why she is now legally blind in her left eye.  Patient was diagnosed with Ischemic Optic Neuropathy and then referred to a neuro opthomologist.  Patient was also diagnosed with Giant Cell Arthritis and was told that she has cataracts in both eyes  that need to be removed as soon as possible.  Patient believes that blood flow was cut off for too long while undergoing the Blepharoplasty, which in turn effected her optic nerve.  Patient stated, "I had 20/20 vision before having the Blepharoplasty".   Patient is scheduled to receive left eye cataract surgery on December 07, 2018 and right eye cataract surgery on December 21, 2018.  Patient admits that she is "literally terrified" to have these surgeries, for fear that she will suffer from complete vision loss.  Patient stated, "I have already had three panic attacks since mid-July, for which an ambulance had to be called out because I thought I was dying".  Patient further admitted that she is now having to take at least three Xanax per day to help calm her nerves and prevent her from "flipping out" and has been prescribed Ambien at night to help her sleep.  Prior to the Xanax and Ambien, patient reported that she would go for days at a time without sleep, refusing to shower, leave her home, or eat, resulting in a 30 pound weight loss.  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  Patient is also experiencing floaters and flashes of light, that she believes is also a direct result of the Blepharoplasty.  Patient indicated that she has been trying to schedule a virtual  therapy appointment with her therapist at [facility], having already received her ipad, but has been unsuccessful thus far.  CSW agreed to try and contact patient's therapist, on behalf of patient, but patient denied, indicating that she will try again in January of 2020, not wanting to add any additional appointments to her schedule, at the present time.  CSW agreed to attend patient's appointment with her  on Thursday, December 07, 2018 at 9:00 AM with Dr. [name] at the [facility], at which time patient will be receiving cataract surgery on her left eye.  Patient admitted that she really has no one to attend this appointment with her, as she does not wish to burden her two daughters with her medical problems, and her sister believes that she will be "just fine".  Patient reported that she relies heavily on her Faith and believes that "God will not turn his back on her", but questions how she got into this position to begin with and does not wish to put her trust in medical professionals ever again.    THN CM Care Plan Problem One     Most Recent Value  Care Plan Problem One  Patient is suffering from Anxiety and Depression.  Role Documenting the Problem One  Clinical Social Worker  Care Plan for Problem One  Active  [facility] Long Term Goal   Patient will have a reduction in symptoms of Anxiety and Depression, through counseling, supportive services and literature, within the next 45 days.  THN Long Term Goal Start Date  11/07/18  [facility] CM Short Term Goal #1   Patient will meet with CSW for an initial home visit, to receive counseling and supportive services, within the next three weeks.  THN CM Short Term Goal #1 Start Date  11/07/18  [facility] CM Short Term Goal #1 Met Date  11/30/18  Interventions for Short Term Goal #1  CSW was able to meet with patient today to perform the initial home visit to provide counseling and supportive services for symptoms of depression.  THN CM Short Term Goal #2   Patient will review EMMI information provided to her regarding "Signs and Symptoms of Depression" and voice understanding within the next 30 days.  THN CM Short Term Goal #2 Start Date  11/07/18  THN CM Short Term Goal #2 Met Date  11/15/18  THN CM Short Term Goal #3  Patient will practice deep breathing exercises and relaxation techniques to help cope with symptoms of depression and anxiety, taught by  CSW, within the next 30 days.  THN CM Short Term Goal #3 Start Date  11/07/18     [name], BSW, MSW, [place]  Licensed Clinical Social Worker  [place]  Mailing [place]. [address], [place], [place] [address] Physical Address-[address], [place], [place] [address] Toll Free Main # [phone] Fax # [phone] Cell # [phone]  Office # [phone] [email or web address]

## 2018-12-06 DIAGNOSIS — I10 Essential (primary) hypertension: Secondary | ICD-10-CM | POA: Diagnosis not present

## 2018-12-07 ENCOUNTER — Other Ambulatory Visit: Payer: Self-pay | Admitting: *Deleted

## 2018-12-07 DIAGNOSIS — H2512 Age-related nuclear cataract, left eye: Secondary | ICD-10-CM | POA: Diagnosis not present

## 2018-12-07 NOTE — Patient Outreach (Signed)
[place] [facility]) Care Management  12/07/2018  [name] [birth year] [id number]  CSW was able to meet with patient and patient's sister, Gabrielle [name] today at the [facility], where patient was scheduled to have left eye cataract removal at 9:00 AM.  CSW agreed to attend this appointment with patient, as CSW is aware of all the anxiety that patient is experiencing regarding having this procedure.  CSW was able to provide counseling and supportive services to patient prior to her appointment, as well as provide words of encouragement.  Patient was most appreciative of CSW attending this appointment with her, admitting that her anxiety level decreased significantly as soon as she saw CSW sitting in the waiting area for her.  CSW agreed to meet with patient again on Tuesday, December 12, 2018 at 11:00 AM to perform a routine home visit to provide counseling and supportive services for symptoms of anxiety and depression, as well as teach patient proper use of deep breathing exercises and relaxation techniques.  THN CM Care Plan Problem One     Most Recent Value  Care Plan Problem One  Patient is suffering from Anxiety and Depression.  Role Documenting the Problem One  Clinical Social Worker  Care Plan for Problem One  Active  [facility] Long Term Goal   Patient will have a reduction in symptoms of Anxiety and Depression, through counseling, supportive services and literature, within the next 45 days.  THN Long Term Goal Start Date  11/07/18  [facility] CM Short Term Goal #1   Patient will meet with CSW for an initial home visit, to receive counseling and supportive services, within the next three weeks.  THN CM Short Term Goal #1 Start Date  11/07/18  THN CM Short Term Goal #1 Met Date  11/30/18  THN CM Short Term Goal #2   Patient will review EMMI information provided to her regarding "Signs and Symptoms of Depression" and voice understanding within the next 30 days.  THN CM  Short Term Goal #2 Start Date  11/07/18  THN CM Short Term Goal #2 Met Date  11/15/18  THN CM Short Term Goal #3  Patient will practice deep breathing exercises and relaxation techniques to help cope with symptoms of depression and anxiety, taught by CSW, within the next 30 days.  THN CM Short Term Goal #3 Start Date  11/07/18     [name], BSW, MSW, [place]  Licensed Clinical Social Worker  [place]  Mailing [place]. [address], [place], [place] [address] Physical Address-[address], [place], [place] [address] Toll Free Main # [phone] Fax # [phone] Cell # [phone]  Office # [phone] [email or web address]

## 2018-12-12 ENCOUNTER — Ambulatory Visit: Payer: Self-pay | Admitting: *Deleted

## 2018-12-13 ENCOUNTER — Other Ambulatory Visit: Payer: Self-pay | Admitting: *Deleted

## 2018-12-13 NOTE — Patient Outreach (Signed)
[facility]) Care Management  12/13/2018  Gabrielle [name] 1951-01-24 [id number]   CSW was able to meet with patient today to perform a routine home visit.  Patient started off the conversation by telling CSW that she did not go to her ENT (Ear, Nose and Throat) doctor appointment with Dr. Izora Gala yesterday because she could not force herself to get out of bed.  Patient admits to experiencing "swishing" in her ears, but does not know if that is related to her Tinnitus.  Patient indicated that she plans to see her Primary Care Physician, Dr. Pricilla Holm on Friday, December 15, 2018 at 2:40 PM to address several concerns, one of which would be the "swishing" in her ears.  Patient is also experiencing "ringing" in her ears, even when she has her hearing aids in.  Patient reported that she definitely plans to attend her appointment with Dr. Clent Jacks, Ophthalmologist, today (Wednesday, December 13, 2018) at 1:30 PM, to check patient's healing process from recent left cataract removal, check the pressure in both eyes and put numbing drops in her eyes to check her vision.  While in patient's presence, CSW made several attempts to try and contact patient's Psychiatrist, Dr. Camille Bal at [facility] to schedule a virtual visit for patient, as patient already has her IPad, but was unsuccessful.  Several messages have been left without a return call.  CSW will continue to attempt to try and contact Dr. Camille Bal to schedule patient an appointment.  CSW encouraged patient to continue to take her antianxiety medication, Xanax 1 MG PO, 2 times daily to help reduce symptoms of anxiety.  Although patient was prescribed an antidepressant medication, Lexapro 5 MG PO, daily, patient reported that she got the prescription filled but never took it, refusing to take it for fear that it will cause her to develop Glaucoma and even further narrow the angles in her eyes.  Patient is fixated on  her eyes and vision and worries about all the things that can affect her, constantly reading and studying to try and better educate herself.  CSW was able to provide counseling and supportive services to patient throughout the session today, offering encouragement to patient with regards to her upcoming right eye cataract surgery, scheduled for next Thursday, December 21, 2018 at 9:00 AM.  Patient is experiencing a great deal of anxiety regarding her upcoming surgery, for which CSW was able to teach patient proper use of deep breathing exercises and relaxation techniques.  Patient agreed to continue to practice these exercises, especially on the day of her scheduled surgery.  Patient's sister, Gabrielle [name] has made arrangements to attend the appointment with patient to drive her to and from the appointment, as well as offer support.  CSW is scheduled to meet with patient again for the next home visit on Tuesday, January 02, 2018 at 11:00 AM.  During this session, CSW will continue to provide counseling and supportive services to patient, as well as try to enlist the support and positive encouragement of patient's family, specifically patient's two daughter's and sister.   [facility] CM Care Plan Problem One     Most Recent Value  Care Plan Problem One  Patient is suffering from Anxiety and Depression.  Role Documenting the Problem One  Clinical Social Worker  Care Plan for Problem One  Active  [facility] Long Term Goal   Patient will have a reduction in symptoms of Anxiety and Depression, through counseling, supportive services and literature, within  the next 45 days.  [facility] Long Term Goal Start Date  11/07/18  [facility] CM Short Term Goal #1   Patient will meet with CSW for an initial home visit, to receive counseling and supportive services, within the next three weeks.  [facility] CM Short Term Goal #1 Start Date  11/07/18  [facility] CM Short Term Goal #1 Met Date  11/30/18  [facility] CM Short Term Goal #2   Patient will review EMMI  information provided to her regarding "Signs and Symptoms of Depression" and voice understanding within the next 30 days.  [facility] CM Short Term Goal #2 Start Date  11/07/18  [facility] CM Short Term Goal #2 Met Date  11/15/18  [facility] CM Short Term Goal #3  Patient will practice deep breathing exercises and relaxation techniques to help cope with symptoms of depression and anxiety, taught by CSW, within the next 30 days.  [facility] CM Short Term Goal #3 Start Date  11/07/18  [facility] CM Short Term Goal #3 Met Date  12/13/18  Interventions for Short Tern Goal #3  CSW was able to teach patient proper use of deep breathing exercises and relaxation techniques to help cope with symptoms of anxiety and depression.     [name], BSW, MSW, LCSW  Licensed Education officer, [facility]  Mailing [place] [address] [place], [place] [address] Physical Address-[address], [place], [place] [address] Toll Free Main # [phone] Fax # [phone] Cell # [phone]  Office # [phone] [email or web address]

## 2018-12-14 DIAGNOSIS — H2511 Age-related nuclear cataract, right eye: Secondary | ICD-10-CM | POA: Diagnosis not present

## 2018-12-15 ENCOUNTER — Ambulatory Visit (INDEPENDENT_AMBULATORY_CARE_PROVIDER_SITE_OTHER): Payer: PPO | Admitting: Internal Medicine

## 2018-12-15 ENCOUNTER — Ambulatory Visit: Payer: Self-pay | Admitting: Internal Medicine

## 2018-12-15 ENCOUNTER — Other Ambulatory Visit: Payer: PPO

## 2018-12-15 ENCOUNTER — Encounter: Payer: Self-pay | Admitting: Internal Medicine

## 2018-12-15 VITALS — BP 144/88 | HR 67 | Temp 98.3°F | Ht 64.0 in | Wt 214.0 lb

## 2018-12-15 DIAGNOSIS — H9313 Tinnitus, bilateral: Secondary | ICD-10-CM

## 2018-12-15 DIAGNOSIS — R030 Elevated blood-pressure reading, without diagnosis of hypertension: Secondary | ICD-10-CM | POA: Diagnosis not present

## 2018-12-15 DIAGNOSIS — R8290 Unspecified abnormal findings in urine: Secondary | ICD-10-CM

## 2018-12-15 LAB — POC URINALSYSI DIPSTICK (AUTOMATED)
Bilirubin, UA: NEGATIVE
Blood, UA: POSITIVE
GLUCOSE UA: NEGATIVE
Ketones, UA: NEGATIVE
NITRITE UA: NEGATIVE
Protein, UA: NEGATIVE
Spec Grav, UA: 1.02 (ref 1.010–1.025)
UROBILINOGEN UA: 0.2 U/dL
pH, UA: 6 (ref 5.0–8.0)

## 2018-12-15 NOTE — Patient Instructions (Signed)
We have cleaned the ear out.  Good luck with the eye surgery!

## 2018-12-15 NOTE — Assessment & Plan Note (Signed)
U/A done today but she is not having signs of UTI and wishes to avoid antibiotics given her eye problems in fear of making them worse. Will send for culture and treat only if appropriate. No recent instrumentation.

## 2018-12-15 NOTE — Assessment & Plan Note (Addendum)
Worsened recently and right ear wax partially removed to see if this would help with symptoms.

## 2018-12-15 NOTE — Progress Notes (Signed)
   Subjective:   Patient ID: Gabrielle [name], female    DOB: November 04, 1951, 67 y.o.   MRN: [id number]  HPI The patient is a 67 YO female coming in for urine odor (denies burning or urgency, denies change in hydration, odor is just strong, denies blood in urine, denies fevers or chills) and blood pressure (worried about upcoming eye surgery due to botched eye surgery over the summer which caused loss of vision mostly in the left eye, just had left eye cataract which was without incident, she is getting the right eye done next week on Thursday, she is more anxious, [facility], denies SI/HI, denies headaches or migraines, denies chest pains) and ear pain (new right ear whoosing sound all the time in the last several weeks, uses hearing aids, worried about wax build up, chronic tinnitus which is worse some days than others).   Review of Systems  Constitutional: Negative.   HENT: Positive for ear pain and tinnitus.   Eyes: Positive for visual disturbance.  Respiratory: Negative for cough, chest tightness and shortness of breath.   Cardiovascular: Negative for chest pain, palpitations and leg swelling.  Gastrointestinal: Negative for abdominal distention, abdominal pain, constipation, diarrhea, nausea and vomiting.  Musculoskeletal: Negative.   Skin: Negative.   Neurological: Negative.   Psychiatric/Behavioral: Negative.     Objective:  Physical Exam Constitutional:      Appearance: She is well-developed.  HENT:     Head: Normocephalic and atraumatic.     Right Ear: Tympanic membrane normal. There is impacted cerumen.     Left Ear: Tympanic membrane normal. There is no impacted cerumen.  Neck:     Musculoskeletal: Normal range of motion.  Cardiovascular:     Rate and Rhythm: Normal rate and regular rhythm.  Pulmonary:     Effort: Pulmonary effort is normal. No respiratory distress.     Breath sounds: Normal breath sounds. No wheezing or  rales.  Abdominal:     General: Bowel sounds are normal. There is no distension.     Palpations: Abdomen is soft.     Tenderness: There is no abdominal tenderness. There is no rebound.  Skin:    General: Skin is warm and dry.  Neurological:     Mental Status: She is alert and oriented to person, place, and time.     Coordination: Coordination normal.     Vitals:   12/15/18 1451  BP: (!) 144/88  Pulse: 67  Temp: 98.3 F (36.8 C)  TempSrc: Oral  SpO2: 97%  Weight: 214 lb (97.1 kg)  Height: 5\' 4"  (1.626 m)    Assessment & Plan:

## 2018-12-15 NOTE — Assessment & Plan Note (Signed)
Likely due to stress and is close to her goal of <140/<90. She will work on some exercise to help and will monitor at home once a week or so.

## 2018-12-17 LAB — CULTURE, URINE COMPREHENSIVE
MICRO NUMBER:: 91526482
SPECIMEN QUALITY: ADEQUATE

## 2018-12-18 ENCOUNTER — Telehealth: Payer: Self-pay

## 2018-12-18 NOTE — Telephone Encounter (Signed)
Pt informed of results and informed that there is no need to treat with abx at this time.

## 2018-12-18 NOTE — Telephone Encounter (Signed)
Pt called and stated that she is concerned that her PPI nor the carafate is not helping with pt GERD. Pt wants to know if there is an alternative.    Pt wanted to know if she needed to be tested for chlamydia in the throat as the cause for pt discomfort. I asked pt if she has been sexually active in that way. Pt stated that she has not been.

## 2018-12-18 NOTE — Telephone Encounter (Signed)
Pt is calling to request a call back from Gabrielle [name] in regards to a few question for a test she looking into. Please advise

## 2018-12-19 MED ORDER — DEXLANSOPRAZOLE 60 MG PO CPDR: 60.0000 mg | DELAYED_RELEASE_CAPSULE | Freq: Every day | ORAL | 3 refills | Status: DC

## 2018-12-19 NOTE — Addendum Note (Signed)
Addended by: Pricilla Holm A on: 12/19/2018 10:52 AM   Modules accepted: Orders

## 2018-12-19 NOTE — Telephone Encounter (Signed)
Patient informed of medication changed and stated understanding. Patient stated understanding about the chlamydia screening, but wanted to know if there is any type of oral swab that can be done to help figure out what is going on in her esophagus

## 2018-12-19 NOTE — Telephone Encounter (Signed)
Does not need chlamydia screening. We can try changing the PPI if she wants from protonix to another one to see if this helps more. Have sent in dexilant for her to try instead which is 1 pill daily.

## 2018-12-21 DIAGNOSIS — H2511 Age-related nuclear cataract, right eye: Secondary | ICD-10-CM | POA: Diagnosis not present

## 2018-12-21 DIAGNOSIS — H268 Other specified cataract: Secondary | ICD-10-CM | POA: Diagnosis not present

## 2018-12-21 NOTE — Telephone Encounter (Signed)
A gi doctor could do several different things for testing but there are not other tests we can check to evaluate the esophagus as most of the tests are through biopsy.

## 2018-12-22 NOTE — Telephone Encounter (Signed)
Patient informed of MD response an stated understanding

## 2018-12-28 ENCOUNTER — Other Ambulatory Visit: Payer: Self-pay | Admitting: Internal Medicine

## 2018-12-28 MED ORDER — METRONIDAZOLE 0.75 % VA GEL: 1.0000 | Freq: Two times a day (BID) | VAGINAL | 0 refills | Status: DC

## 2018-12-28 NOTE — Telephone Encounter (Signed)
Sent in vaginal gel.

## 2018-12-28 NOTE — Telephone Encounter (Signed)
Patient informed. 

## 2018-12-28 NOTE — Addendum Note (Signed)
Addended by: Pricilla Holm A on: 12/28/2018 03:58 PM   Modules accepted: Orders

## 2018-12-28 NOTE — Telephone Encounter (Signed)
Copied from [place] [id number]. Topic: Quick Communication - Rx Refill/Question >> Dec 28, 2018 10:58 AM Nils Flack wrote: Medication: metronidazole   Has the patient contacted their pharmacy? No. (Agent: If no, request that the patient contact the pharmacy for the refill.) (Agent: If yes, when and what did the pharmacy advise?)  Preferred Pharmacy (with phone number [facility] street [facility]): [facility]  [address]  Pt is requesting 7 might metronidalzole applicator, not the pills,  Pt says she is having symptoms.  Pt says provider is aware.    Agent: Please be advised that RX refills may take up to 3 business days. We ask that you follow-up with your pharmacy.

## 2018-12-28 NOTE — Telephone Encounter (Signed)
Pt having itching, odor, yellow discharge. No abdominal pain or fever. Pt stated that Dr Sharlet Salina is aware of these symptoms in past and wants med called in.

## 2019-01-02 ENCOUNTER — Other Ambulatory Visit: Payer: Self-pay | Admitting: *Deleted

## 2019-01-02 ENCOUNTER — Ambulatory Visit: Payer: Self-pay | Admitting: *Deleted

## 2019-01-02 NOTE — Patient Outreach (Signed)
[facility]) Care Management  01/02/2019  Gabrielle [name] 02-15-1951 [id number]  CSW received an incoming call from patient requesting to reschedule her appointment with CSW, scheduled for today (Tuesday, January 02, 2019) at 11:00 AM.  Patient admitted that she was not feeling well today and "not up for the visit". CSW voiced understanding, agreeing to reschedule the routine home visit with patient for Wednesday, January 10, 2019 at 12:00 PM. [name], BSW, MSW, [place]  Licensed Clinical Social Worker  [place]  Mailing [place] [address] [place], [place] [address] Physical Address-[address], [place], [place] [address] Toll Free Main # [phone] Fax # [phone] Cell # [phone]  Office # [phone] [email or web address]

## 2019-01-10 ENCOUNTER — Other Ambulatory Visit: Payer: Self-pay | Admitting: *Deleted

## 2019-01-10 NOTE — Patient Outreach (Signed)
[name]) Care Management  01/10/2019  Gabrielle [name] 10-Aug-1951 [id number]   CSW was able to meet with patient today to perform a routine home visit.  Patient was actually dressed and preparing to go visit her friend in a nursing home at the time of CSW's arrival.  Patient appeared to be in good spirits today, admitting that her mood is gradually improving, day-by-day.  Patient attributes this to her vision becoming more clear and her ability to get out of the house to perform activities with family members and friends.  Patient also reported that she is feeling more hopeful toward her future.   Patient admits that her vision has gotten a lot better since she had the cataract removed in her right eye, no longer feeling like she is "looking through a coke bottle".  Patient further reported that she was recently told by Dr. 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  Patient has been prescribed antibiotic eye drops to keep swelling down and eliminate the possibility of infection.  Patient has also been prescribed eye drops to keep the pressure down in her eyes from Occular Hypertension.   Patient continues to work with an attorney to try and bring a lawsuit against the plastic surgeon that performed the Blepharoplasty on June 27, 2018.  Patient honestly believes that this surgeon "botched" the procedure, which resulted in loss of vision in her left eye.  Patient's attorney has requested that patient pay $1,700.00 before January 27, 2019 because the attorney needs to hire a Nature conservation officer to check patient's phone records, as well as an expert witness that is willing to testify that this was medical malpractice.  Patient reported that her pending lawsuit, and her Faith in God, are the only things that are giving her hope at this point in her life.  Patient believes  that she deserves justice, admitting that she is "fixated" on her negative experience.  Patient indicated that she was finally able to schedule a virtual appointment with Dr. Camille Bal, Psychiatrist with [facility], for February 15, 2019 at 2:00 PM.  Patient indicated that she already has the [facility] and has been given her password and instructions on proper use.  CSW was also able to provide counseling and supportive services to patient today.  Patient was completely baffled when CSW explained to her that she had two upcoming appointments, admitting that she did not remember having made these appointments.  In the presence of CSW, patient called and cancelled her appointment with Dr. Rutherford Guys, Ophthalmologist with [place] [facility] on February 26, 2019, as well as her appointment with Dr. Fredrich Birks, Optometrist with [facility] on March 11, 2019.  CSW wanted to ensure that patient is not a "no show" for any physician appointments.  CSW will make arrangements to meet with patient again on Tuesday, February 13, 2019 at 11:00 AM to perform a routine home visit, per patient's request.  Patient indicated that she wanted to determine whether or not she could make it an entire month without receiving the support and assistance of CSW.  CSW voiced understanding, encouraging patient to contact CSW directly if she changes her mind or needs social work services and/or assistance in the meantime.  Patient voiced understanding and was agreeable to this plan.   [facility] CM Care Plan Problem One  Most Recent Value  Care Plan Problem One  Patient is suffering from Anxiety and Depression.  Role Documenting the Problem One  Clinical Social Worker  Care Plan for Problem One  Active  [facility] Long Term Goal   Patient will have a reduction in symptoms of Anxiety and Depression, through counseling, supportive services and literature, within the next 45 days.  [facility] Long  Term Goal Start Date  11/07/18  [facility] CM Short Term Goal #1   Patient will meet with CSW for an initial home visit, to receive counseling and supportive services, within the next three weeks.  [facility] CM Short Term Goal #1 Start Date  11/07/18  [facility] CM Short Term Goal #1 Met Date  11/30/18  [facility] CM Short Term Goal #2   Patient will review EMMI information provided to her regarding "Signs and Symptoms of Depression" and voice understanding within the next 30 days.  [facility] CM Short Term Goal #2 Start Date  11/07/18  [facility] CM Short Term Goal #2 Met Date  11/15/18  [facility] CM Short Term Goal #3  Patient will practice deep breathing exercises and relaxation techniques to help cope with symptoms of depression and anxiety, taught by CSW, within the next 30 days.  [facility] CM Short Term Goal #3 Start Date  11/07/18  [facility] CM Short Term Goal #3 Met Date  12/13/18      [name], BSW, MSW, [place]  Licensed Clinical Social Worker  [place]  Mailing [place]. [address], [place], [place] [address] Physical Address-[address], [place], [place] [address] Toll Free Main # [phone] Fax # [phone] Cell # [phone]  Office # [phone] [email or web address]

## 2019-01-15 ENCOUNTER — Ambulatory Visit: Payer: Self-pay

## 2019-01-15 NOTE — Telephone Encounter (Signed)
  Reason for Disposition . Caller has medication question only, adult not sick, and triager answers question  Answer Assessment - Initial Assessment Questions 1. SYMPTOMS: "Do you have any symptoms?"     no 2. SEVERITY: If symptoms are present, ask "Are they mild, moderate or severe?"     n/a  Protocols used: MEDICATION QUESTION CALL-A-AH

## 2019-01-15 NOTE — Telephone Encounter (Signed)
Returned call to pt.  Reported she gets her Xanax and Ambien prescribed by her Psychiatrist,  but has heard from other people that the same Psychiatrist has stopped refilling the above prescriptions.  Has an appt. with her Psychiatrist on Friday.  Is asking if Dr. Sharlet Salina will prescribe Ambien and Xanax, if her Psychiatrist decides not to continue refilling?  Advised will need to schedule an appt. with Dr. Sharlet Salina to discuss this, if her Psychiatrist decides not to continue prescribing the above meds.  Verb. Understanding.  Agrees with plan.   Message from Ivar Drape sent at 01/15/2019 12:05 PM EST   Patient would like to discuss her medications

## 2019-01-17 DIAGNOSIS — D23121 Other benign neoplasm of skin of left upper eyelid, including canthus: Secondary | ICD-10-CM | POA: Diagnosis not present

## 2019-01-17 DIAGNOSIS — H04129 Dry eye syndrome of unspecified lacrimal gland: Secondary | ICD-10-CM | POA: Diagnosis not present

## 2019-01-17 DIAGNOSIS — D485 Neoplasm of uncertain behavior of skin: Secondary | ICD-10-CM | POA: Diagnosis not present

## 2019-01-17 DIAGNOSIS — H019 Unspecified inflammation of eyelid: Secondary | ICD-10-CM | POA: Diagnosis not present

## 2019-01-19 ENCOUNTER — Telehealth: Payer: Self-pay

## 2019-01-19 NOTE — Telephone Encounter (Signed)
Copied from [place] [id number]. Topic: General - Other >> Jan 19, 2019  1:56 PM Judyann Munson wrote: Reason for CRM: patient is requesting briana, Dr.Crawford nurse  to contact her back in regards to personal matters. Please advise

## 2019-01-19 NOTE — Telephone Encounter (Signed)
Called patient and LVM informing her that Dr. Sharlet Salina will be out of the office till the 29th and that I will be out of the office on Monday. Also informed her that I am in the office till five today if she can call back before then that would be great if not I will call her Tuesday unless she wants to talk to someone else

## 2019-01-19 NOTE — Telephone Encounter (Signed)
Went to therapist where she gets her [place] and xanax. And was told by him that he could not write them out anymore because he is under scrutiny for patient abusing their controlled meds. Patient takes 1 xanax 3 times a day and 1 ambien at night. Patient made appointment for the third but will run out of medication on the second does have a refill left from therapist but is unsure if she will be able to fill it with whatever is going on. Told patient that I would route message to PCP but that Dr. Sharlet Salina does not get back till the 29th. Patient just seems very worried to not have her medication and does not want to experience withdrawal.

## 2019-01-24 NOTE — Telephone Encounter (Signed)
Spoke with patient about MD response and patient stated understanding and will call back on Monday if she is unable to pick up those refills from the pharmacy. states that some people were able to get their refills and some where not. Also wants to make sure that the 3 xanax a day and 1 Ambien at night will still be prescribed till she can get in with another psych provider long term.

## 2019-01-24 NOTE — Telephone Encounter (Signed)
I would recommend to work on getting another psych provider to manage long term. She should not have a problem getting medication refills which have already been ordered. Can keep upcoming visit and I am okay with filling until she gets in with new provider to manage long term.

## 2019-01-29 ENCOUNTER — Ambulatory Visit: Payer: Self-pay | Admitting: Internal Medicine

## 2019-02-05 DIAGNOSIS — Z9889 Other specified postprocedural states: Secondary | ICD-10-CM | POA: Diagnosis not present

## 2019-02-05 DIAGNOSIS — H02831 Dermatochalasis of right upper eyelid: Secondary | ICD-10-CM | POA: Diagnosis not present

## 2019-02-05 DIAGNOSIS — H04123 Dry eye syndrome of bilateral lacrimal glands: Secondary | ICD-10-CM | POA: Diagnosis not present

## 2019-02-05 DIAGNOSIS — H02834 Dermatochalasis of left upper eyelid: Secondary | ICD-10-CM | POA: Diagnosis not present

## 2019-02-06 DIAGNOSIS — F411 Generalized anxiety disorder: Secondary | ICD-10-CM | POA: Diagnosis not present

## 2019-02-06 DIAGNOSIS — E785 Hyperlipidemia, unspecified: Secondary | ICD-10-CM | POA: Diagnosis not present

## 2019-02-06 DIAGNOSIS — H9319 Tinnitus, unspecified ear: Secondary | ICD-10-CM | POA: Diagnosis not present

## 2019-02-06 DIAGNOSIS — D696 Thrombocytopenia, unspecified: Secondary | ICD-10-CM | POA: Diagnosis not present

## 2019-02-06 DIAGNOSIS — Z0000 Encounter for general adult medical examination without abnormal findings: Secondary | ICD-10-CM | POA: Diagnosis not present

## 2019-02-06 DIAGNOSIS — N390 Urinary tract infection, site not specified: Secondary | ICD-10-CM | POA: Diagnosis not present

## 2019-02-06 DIAGNOSIS — I10 Essential (primary) hypertension: Secondary | ICD-10-CM | POA: Diagnosis not present

## 2019-02-06 DIAGNOSIS — M545 Low back pain: Secondary | ICD-10-CM | POA: Diagnosis not present

## 2019-02-13 ENCOUNTER — Other Ambulatory Visit: Payer: Self-pay | Admitting: *Deleted

## 2019-02-13 ENCOUNTER — Encounter: Payer: Self-pay | Admitting: *Deleted

## 2019-02-13 NOTE — Patient Outreach (Signed)
[facility]) Care Management  02/13/2019  Gabrielle [name] January 22, 1951 [id number]   CSW was able to meet with patient today to perform a discharge home visit.  Patient appeared to be in good spirits today, reporting that her vision is improving more and more each day.  Patient admitted that her mood is definitely better and that she has been trying to get out of the house more.  Patient reported having just returned from her bank, where she is in the process of trying to get all her affairs in order.  Patient indicated that she finally has the strength and capability to doing certain tasks that she has been putting off for quite some time.   Patient indicated that she started seeing a "plain white floater" in her right eye on Thursday, February 08, 2019. Patient immediately scheduled an appointment with Dr. Clent Jacks at [facility].  Patient was told by Dr. [name] that she has PVD (Posterior Vitreous Detachment).  It was explained to patient that she would experience floaters for a while, as patient recently underwent bilateral cataract surgery.  Patient was also told that a sudden increase in the number of floaters can be a sign of a tear in her retina, or of some other eye problem, and to call their office immediately.  Patient now concerned about experiencing a detached retina.  Patient reported that she decided to cancel her appointment with Dr. Camille Bal, Psychiatrist at [facility], originally scheduled for Thursday, February 15, 2019 at 2:00PM.  Patient stated that she cancelled the appointment due to the threat of inclement weather.  Patient admitted that she does not like to drive in the snow, nor will she allow her sister, Gabrielle [name] transport her.  Patient was able to reschedule the appointment for Thursday, March 15, 2019 at 2:00PM.  In the meantime, patient reported that she will schedule an appointment with Dr. Bernita Raisin, Psychiatrist in  [place], that prescribes patient's Xanax.  Patient requested that CSW contact Dr. Pricilla Holm, patient's Primary Care Physician's office, at [facility] [place], to find out what side effects she may experience as a result of taking Augmentin for her Urinary Tract Infection.  CSW was able to speak with Gabrielle [name], Dr. Nathanial [facility] Nurse, who reports that patient should not experience any side effects in relationship with her vision, as this was patient's main concern.  CSW finally ended up handing the phone to patient for her to speak with [name] directly, as patient had more than just a few questions that she wanted to have answered.  Patient admitted that she has had a major reduction in her symptoms of anxiety and depression, as a result of working with CSW and receiving ongoing counseling and supportive services through [facility].  Patient is aware that CSW will be performing a case closure on patient, as all goals of treatment have been met from social work standpoint and no additional social work needs have been identified at this time.  CSW will fax an update to patient's Primary Care Physician, Dr. Pricilla Holm to ensure that they are aware of CSW's involvement with patient's plan of care.  CSW was able to confirm that patient has the correct contact information for CSW, encouraging patient to contact CSW directly if additional social work needs arise in the near future.    [facility] CM Care Plan Problem One     Most Recent Value  Care Plan Problem One  Patient is suffering from Anxiety and  Depression.  (Pended)   Role Documenting the Problem One  Clinical Social Worker  (Pended)   [place] for Problem One  Active  (Pended)   THN Long Term Goal   Patient will have a reduction in symptoms of Anxiety and Depression, through counseling, supportive services and literature, within the next 45 days.  (Pended)   THN Long Term Goal Start Date  11/07/18  (Pended)   [facility] CM Short Term Goal #1    Patient will meet with CSW for an initial home visit, to receive counseling and supportive services, within the next three weeks.  (Pended)   THN CM Short Term Goal #1 Start Date  11/07/18  (Pended)   THN CM Short Term Goal #1 Met Date  11/30/18  (Pended)   THN CM Short Term Goal #2   Patient will review EMMI information provided to her regarding "Signs and Symptoms of Depression" and voice understanding within the next 30 days.  (Pended)   THN CM Short Term Goal #2 Start Date  11/07/18  (Pended)   THN CM Short Term Goal #2 Met Date  11/15/18  (Pended)   THN CM Short Term Goal #3  Patient will practice deep breathing exercises and relaxation techniques to help cope with symptoms of depression and anxiety, taught by CSW, within the next 30 days.  (Pended)   THN CM Short Term Goal #3 Start Date  11/07/18  (Pended)   THN CM Short Term Goal #3 Met Date  12/13/18  (Pended)      Gabrielle [name], BSW, MSW, [place]  Licensed Clinical Social Worker  [place]  Mailing Address-[address], [place], [place] [address] Physical Address-[address], [place], [place] [address] Toll Free Main # [phone] Fax # [phone] Cell # [phone]  Office # [phone] [name]

## 2019-02-19 ENCOUNTER — Other Ambulatory Visit: Payer: Self-pay

## 2019-02-19 DIAGNOSIS — D696 Thrombocytopenia, unspecified: Secondary | ICD-10-CM

## 2019-02-19 NOTE — Addendum Note (Signed)
Addended by: Raford Pitcher R on: 02/19/2019 04:37 PM   Modules accepted: Orders

## 2019-02-19 NOTE — Telephone Encounter (Signed)
LVM with MD response and to call back

## 2019-02-19 NOTE — Telephone Encounter (Signed)
I would recommend to ask her eye specialist as they would be more familiar with an eye surgeon and if they think this would be helpful. Okay to fill xanax for a month but would not be long term. Is she requesting refill now of xanax?

## 2019-02-19 NOTE — Telephone Encounter (Signed)
Patient needing refills for Ambien and Xanax for march her bottle said can call in on March 2nd   Patient states the scar tissue is around her eye where the incision was at. states that she has complained to her eye specialist but he has not mentioned any way to help it besides eye drops. Patient was just wondering if you knew of a Psychiatric nurse. Informed her that you suggested to ask her eye specialist.

## 2019-02-19 NOTE — Telephone Encounter (Signed)
Called patient back and she states that she needs refills for her march medications for the xanax three times daily and the Ambien 1 at bedtime. States that she is asking her current psych provider to refer her to someone else so she can start getting these meds from a psych provider again.   Patient also was wondering if  you know of any plastic surgeons that could help with the scar tissue on her eye that is really bad from her surgery?

## 2019-02-20 MED ORDER — ZOLPIDEM TARTRATE 10 MG PO TABS: 10.0000 mg | ORAL_TABLET | Freq: Every evening | ORAL | 0 refills | Status: DC | PRN

## 2019-02-20 MED ORDER — ALPRAZOLAM 1 MG PO TABS: 1.0000 mg | ORAL_TABLET | Freq: Three times a day (TID) | ORAL | 0 refills | Status: DC

## 2019-02-28 DIAGNOSIS — H16223 Keratoconjunctivitis sicca, not specified as Sjogren's, bilateral: Secondary | ICD-10-CM | POA: Diagnosis not present

## 2019-02-28 DIAGNOSIS — Z961 Presence of intraocular lens: Secondary | ICD-10-CM | POA: Diagnosis not present

## 2019-03-05 DIAGNOSIS — N390 Urinary tract infection, site not specified: Secondary | ICD-10-CM | POA: Diagnosis not present

## 2019-03-07 DIAGNOSIS — N390 Urinary tract infection, site not specified: Secondary | ICD-10-CM | POA: Diagnosis not present

## 2019-03-13 ENCOUNTER — Telehealth: Payer: Self-pay

## 2019-03-13 NOTE — Telephone Encounter (Signed)
We can definitely refill her medications until then. I would recommend the honey as soothing for her throat to help. Does not sound like coronavirus at all. If she gets fevers or SOB call back.

## 2019-03-13 NOTE — Telephone Encounter (Signed)
Copied from [place] [phone]. Topic: General - Inquiry >> Mar 13, 2019  1:34 PM [name] wrote: Reason for CRM: Pt wants Gabrielle [name] to call her in reference to medications and their refills. Pt is concerned about having enough and pharmacies closing/ please advise

## 2019-03-13 NOTE — Telephone Encounter (Signed)
Patient states she is having a dry cough and sometimes the cough will hurt her chest. States that her cough has subsided but when she takes a deep breath will still have some discomfort. States not having any fever saw her doctor at the [place] had had her temperature taken there. States she walked two miles yesterday and did not have any SOB or pain. Has not taken anything for the cough but drank chamomile tea with honey. Patient just wanted to make sure that you were aware and did not know if there was something that she should be doing just nervous with everything going on.   Patient states that she has found another psych provider but is not going to go till the end of next month and was wanting to know if she could get one more month sent for her xanax and ambien.

## 2019-03-13 NOTE — Telephone Encounter (Signed)
Patient informed of MD response stated understanding and is appreciative that the meds will be sent in another month

## 2019-03-16 NOTE — Telephone Encounter (Signed)
Patient called back, requesting to speak with Briana. She feels as if tea with honey is not helping soothe her throat/cough. She inquired if there is anything else she should be doing. She denied any SOB or fever.

## 2019-03-16 NOTE — Telephone Encounter (Signed)
If not taking zyrtec (cetirizine) or claritin should start taking that to help with allergens.

## 2019-03-16 NOTE — Telephone Encounter (Signed)
Patient seemed more worried if her symptoms could be covid I reassured her that the symptoms of sore throat and clearing of her throat kind of cough and no fever that she was okay. States she usually does not take allergy medications because she tried to limit what she takes because of her tinnitus.

## 2019-03-21 ENCOUNTER — Other Ambulatory Visit: Payer: Self-pay

## 2019-03-21 DIAGNOSIS — D696 Thrombocytopenia, unspecified: Secondary | ICD-10-CM

## 2019-03-21 NOTE — Telephone Encounter (Signed)
Copied from [place] [id number]. Topic: General - Call Back - No Documentation >> Mar 21, 2019 10:08 AM Sheran Luz wrote: Reason for CRM: Patient returning call to [facility] regarding medications.

## 2019-03-21 NOTE — Telephone Encounter (Signed)
Control database checked last refill:02/25/2019  LOV: 11/27/2018 JEA:DGNP

## 2019-03-21 NOTE — Telephone Encounter (Signed)
Patient would like a call when refilled

## 2019-03-22 MED ORDER — ZOLPIDEM TARTRATE 10 MG PO TABS: 10.0000 mg | ORAL_TABLET | Freq: Every evening | ORAL | 0 refills | Status: DC | PRN

## 2019-03-22 MED ORDER — ALPRAZOLAM 1 MG PO TABS: 1.0000 mg | ORAL_TABLET | Freq: Three times a day (TID) | ORAL | 0 refills | Status: DC

## 2019-03-27 DIAGNOSIS — D3132 Benign neoplasm of left choroid: Secondary | ICD-10-CM | POA: Diagnosis not present

## 2019-03-27 DIAGNOSIS — H40053 Ocular hypertension, bilateral: Secondary | ICD-10-CM | POA: Diagnosis not present

## 2019-03-27 DIAGNOSIS — H43811 Vitreous degeneration, right eye: Secondary | ICD-10-CM | POA: Diagnosis not present

## 2019-03-27 DIAGNOSIS — Z961 Presence of intraocular lens: Secondary | ICD-10-CM | POA: Diagnosis not present

## 2019-03-27 DIAGNOSIS — H47012 Ischemic optic neuropathy, left eye: Secondary | ICD-10-CM | POA: Diagnosis not present

## 2019-03-27 DIAGNOSIS — H16223 Keratoconjunctivitis sicca, not specified as Sjogren's, bilateral: Secondary | ICD-10-CM | POA: Diagnosis not present

## 2019-03-30 ENCOUNTER — Ambulatory Visit: Payer: Self-pay

## 2019-03-30 NOTE — Telephone Encounter (Signed)
Patient can not find a thermometer anywhere in town. She does not have access to thermometer even from family member.  Patient states she felt tired yesterday in the late afternoon- she felt better today. Patient is still staying in the house. Patient reports she is breathing fine. The cough has improved.  Told patient it is encouraging that her symptoms are getting better- and she should let us know if she has any changes. She wants to know if writing a prescription for a thermometer might help her get one if the pharmacy should get some in- she thinks she might be able to get one quicker that way. Please let her know.  Reason for Disposition . [1] Caller requesting NON-URGENT health information AND [2] PCP's office is the best resource    Patient is calling to report she is unable to monitor her temperature.  Answer Assessment - Initial Assessment Questions 1. REASON FOR CALL or QUESTION: "What is your reason for calling today?" or "How can I best help you?" or "What question do you have that I can help answer?"     Patient is unable to find thermometer so she is unable to track her temperature. She had a thermometer for under the arm- but returned it. Now she does not have a thermometer to check her temperature.  Protocols used: INFORMATION ONLY CALL-A-AH

## 2019-03-30 NOTE — Telephone Encounter (Signed)
    Gabrielle [name] Female, 68 y.o., 1951-08-06 MRN:  [id number] Phone:  [phone] Gabrielle [name]) PCP:  [name], MD Primary [employer] [place] Message from Berneta Levins sent at 03/30/2019 3:36 PM EDT   Summary: taking temperature   Pt called and state she needs to speak with a nurse about how to take her temperature.        Call History    Type Contact Phone  03/30/2019 03:35 PM Phone (Incoming) Gabrielle, [name] (Self) [phone] (H)  User: Berneta Levins

## 2019-04-02 NOTE — Telephone Encounter (Signed)
Okay just to monitor symptoms.

## 2019-04-02 NOTE — Telephone Encounter (Signed)
LVM with MD response  

## 2019-04-06 ENCOUNTER — Telehealth: Payer: Self-pay | Admitting: Internal Medicine

## 2019-04-06 NOTE — Telephone Encounter (Signed)
Copied from [place] [phone]. Topic: Quick Communication - Rx Refill/Question >> Apr 06, 2019 12:23 PM Nils Flack wrote: Medication: omepraole   Has the patient contacted their pharmacy? Yes  (Agent: If no, request that the patient contact the pharmacy for the refill.) (Agent: If yes, when and what did the pharmacy advise?)  Preferred Pharmacy (with phone number or street [facility]   Agent: Please be advised that RX refills may take up to 3 business days. We ask that you follow-up with your pharmacy.

## 2019-04-09 DIAGNOSIS — Z961 Presence of intraocular lens: Secondary | ICD-10-CM | POA: Diagnosis not present

## 2019-04-09 DIAGNOSIS — H16223 Keratoconjunctivitis sicca, not specified as Sjogren's, bilateral: Secondary | ICD-10-CM | POA: Diagnosis not present

## 2019-04-09 DIAGNOSIS — H43811 Vitreous degeneration, right eye: Secondary | ICD-10-CM | POA: Diagnosis not present

## 2019-04-09 NOTE — Telephone Encounter (Signed)
Patient returning call stating she's completely out omeprazole 40mg  and pharmacy denying receiving medication, please advise   [id number] [place], [place]. [phone] (Phone) [phone] (Fax)

## 2019-04-09 NOTE — Telephone Encounter (Signed)
Med not on med list pls advise../lmb 

## 2019-04-09 NOTE — Telephone Encounter (Signed)
Which medication is she taking now? We have dexilant or protonix on her med list.

## 2019-04-09 NOTE — Telephone Encounter (Signed)
Called pt no answer LMOM w/MD response.Marland KitchenMarland KitchenI sent the Rx to the pharmacy. CRM sent to [facility] for FYI...Johny Chess

## 2019-04-10 MED ORDER — OMEPRAZOLE 40 MG PO CPDR: 40.0000 mg | DELAYED_RELEASE_CAPSULE | Freq: Every day | ORAL | 3 refills | Status: DC

## 2019-04-10 NOTE — Telephone Encounter (Signed)
States that she is not taking the protonix or the dexilant states those were ones that she has tried but didn't work. She is taking the omeprazole 40mg  she thought it was from you but it could of been from her previous doctor Dr. Baird Cancer. She gets it from the [place] but it is going to take a long time to get it mailed to her and would like a supply sent to [facility]

## 2019-04-10 NOTE — Telephone Encounter (Signed)
I'm not sure the thread of this conversation. We have that she should be taking either protonix or dexilant for acid reflux. Has someone else changed her medication or what medication is she taking?

## 2019-04-10 NOTE — Telephone Encounter (Signed)
Sent in

## 2019-04-10 NOTE — Telephone Encounter (Signed)
Patient informed Rx has been sent  

## 2019-04-12 ENCOUNTER — Telehealth: Payer: Self-pay

## 2019-04-12 NOTE — Telephone Encounter (Signed)
Copied from [place] [phone]. Topic: Appointment Scheduling - Scheduling Inquiry for Clinic >> Apr 12, 2019 11:10 AM Rayann Heman wrote: Reason for CRM: pt called and stated that she would like to know if Dr Sharlet Salina would take on her niece as a patient. Pt would like a call back from [place] Please advise >> Apr 12, 2019 11:35 AM Morey Hummingbird wrote: Niece is Drucilla Schmidt,  DOB [birth year], MRN [id number] Please advise

## 2019-04-12 NOTE — Telephone Encounter (Signed)
Resent in proper chart I totally did not see that the MRN was in this note

## 2019-04-12 NOTE — Telephone Encounter (Signed)
Notes about possible new patients need to go in patient's chart. Usually the patient themselves might call wanting to establish. Happy to review this in the proper chart.

## 2019-04-13 ENCOUNTER — Telehealth: Payer: Self-pay

## 2019-04-13 NOTE — Telephone Encounter (Signed)
Patient called wanting to inform us about what is going on in regard to her psychiatrist appointment. Patient states that this appointment is being pushed out due to the virus and wanted [place] to know hoping we could send in refills of her medications when they are due. Patient also wanted to let us know that she has been in contact with someone at the [place] to get weaned off of the alprazolam and zolpidem, but that is also pushed out to due to the virus. Patient apologized for having to keep asking [place] to refill these medications and that she really does have a psychiatrist and really is trying to get with someone to get off these meds but for now she is stuck.  Patient states will call back when it is closer to the time to refill medications and I informed patient I would route the message to Dr. Sharlet Salina.

## 2019-04-13 NOTE — Telephone Encounter (Signed)
Not a problem to refill.

## 2019-04-16 DIAGNOSIS — H43811 Vitreous degeneration, right eye: Secondary | ICD-10-CM | POA: Diagnosis not present

## 2019-04-16 DIAGNOSIS — Z961 Presence of intraocular lens: Secondary | ICD-10-CM | POA: Diagnosis not present

## 2019-04-16 DIAGNOSIS — H40053 Ocular hypertension, bilateral: Secondary | ICD-10-CM | POA: Diagnosis not present

## 2019-04-16 DIAGNOSIS — D3132 Benign neoplasm of left choroid: Secondary | ICD-10-CM | POA: Diagnosis not present

## 2019-04-16 DIAGNOSIS — H16223 Keratoconjunctivitis sicca, not specified as Sjogren's, bilateral: Secondary | ICD-10-CM | POA: Diagnosis not present

## 2019-04-16 DIAGNOSIS — H47012 Ischemic optic neuropathy, left eye: Secondary | ICD-10-CM | POA: Diagnosis not present

## 2019-04-19 ENCOUNTER — Encounter: Payer: Self-pay | Admitting: Internal Medicine

## 2019-04-19 ENCOUNTER — Telehealth: Payer: Self-pay

## 2019-04-19 ENCOUNTER — Ambulatory Visit (INDEPENDENT_AMBULATORY_CARE_PROVIDER_SITE_OTHER): Payer: PPO | Admitting: Internal Medicine

## 2019-04-19 DIAGNOSIS — D696 Thrombocytopenia, unspecified: Secondary | ICD-10-CM

## 2019-04-19 DIAGNOSIS — H04123 Dry eye syndrome of bilateral lacrimal glands: Secondary | ICD-10-CM | POA: Diagnosis not present

## 2019-04-19 DIAGNOSIS — K219 Gastro-esophageal reflux disease without esophagitis: Secondary | ICD-10-CM | POA: Diagnosis not present

## 2019-04-19 DIAGNOSIS — F4312 Post-traumatic stress disorder, chronic: Secondary | ICD-10-CM

## 2019-04-19 DIAGNOSIS — D210 Benign neoplasm of connective and other soft tissue of head, face and neck: Secondary | ICD-10-CM | POA: Diagnosis not present

## 2019-04-19 DIAGNOSIS — L905 Scar conditions and fibrosis of skin: Secondary | ICD-10-CM | POA: Diagnosis not present

## 2019-04-19 DIAGNOSIS — F5104 Psychophysiologic insomnia: Secondary | ICD-10-CM

## 2019-04-19 DIAGNOSIS — H02831 Dermatochalasis of right upper eyelid: Secondary | ICD-10-CM | POA: Diagnosis not present

## 2019-04-19 DIAGNOSIS — H02834 Dermatochalasis of left upper eyelid: Secondary | ICD-10-CM | POA: Diagnosis not present

## 2019-04-19 DIAGNOSIS — Z9889 Other specified postprocedural states: Secondary | ICD-10-CM | POA: Diagnosis not present

## 2019-04-19 MED ORDER — FAMOTIDINE 20 MG PO TABS: 20.0000 mg | ORAL_TABLET | Freq: Two times a day (BID) | ORAL | 3 refills | Status: DC

## 2019-04-19 MED ORDER — ALPRAZOLAM 1 MG PO TABS: 1.0000 mg | ORAL_TABLET | Freq: Three times a day (TID) | ORAL | 0 refills | Status: DC

## 2019-04-19 MED ORDER — ZOLPIDEM TARTRATE 10 MG PO TABS: 10.0000 mg | ORAL_TABLET | Freq: Every evening | ORAL | 0 refills | Status: DC | PRN

## 2019-04-19 NOTE — Assessment & Plan Note (Signed)
Will do omeprazole 40 mg daily and add rx for pepcid 40 mg nightly for 1-2 weeks. Then switch to pepcid 20 mg BID if able. Do not need to continue carafate if it is not helping.

## 2019-04-19 NOTE — Assessment & Plan Note (Signed)
Refill ambien and counseled about the benefit and risk and she elects to continue with therapy.

## 2019-04-19 NOTE — Telephone Encounter (Signed)
Will address at visit.

## 2019-04-19 NOTE — Assessment & Plan Note (Signed)
Refill for xanax and counseled about the benefits and risks. She understands and elects to continue therapy.

## 2019-04-19 NOTE — Progress Notes (Signed)
Virtual Visit via Video Note  I connected with Gabrielle [name] on 04/19/19 at  1:40 PM EDT by a video enabled telemedicine application and verified that I am speaking with the correct person using two identifiers.   I discussed the limitations of evaluation and management by telemedicine and the availability of in person appointments. The patient expressed understanding and agreed to proceed.  History of Present Illness: The patient is a 68 y.o. female with visit for pain in her throat (taking omeprazole 40 mg daily, has also tried carafate also which is not helping, started initially about 10 years ago and has gone into remission several times, denies blood in stool, denies vomiting or nausea, she is able to eat some bland mashed potatoes without much pain, denies weight loss) and anxiety (is working with psych and they are no longer in business, she is waiting to see a new provider through the [place], denies SI/HI, is more anxious with the coronavirus lately, denies overuse, denies side effects) and insomnia (some PTSD, she were seeing psych and she is waiting to see a new provider through the [place], she does have problems sleeping without it, is more stressed with the coronavirus, denies SI/HI).  Observations/Objective: Appearance: normal, breathing appears normal, normal grooming, abdomen does not appear distended, throat normal, memory normal, mental status is A and O times 3  Assessment and Plan: See problem oriented charting  Follow Up Instructions: rx for pepcid 40 mg nightly, continue omeprazole 40 mg morning, refill xanax and ambien  I discussed the assessment and treatment plan with the patient. The patient was provided an opportunity to ask questions and all were answered. The patient agreed with the plan and demonstrated an understanding of the instructions.   The patient was advised to call back or seek an in-person evaluation if the symptoms worsen or if the condition fails to improve as  anticipated.  [name], MD

## 2019-04-19 NOTE — Telephone Encounter (Signed)
Copied from [place] [phone]. Topic: General - Other >> Apr 18, 2019  3:03 PM Sheran Luz wrote: Reason for CRM: Patient requesting a call back from [place], as she has some questions about the medication omeprazole (PRILOSEC) 40 MG capsule.

## 2019-04-19 NOTE — Telephone Encounter (Signed)
Scheduled patient for a doxy visit at 1:40  Called patient back in regard to her medication. Patient states that the omeprazole is not helping even the Carafate that she was not going to get because it was 240 dollars is not helping. Patient is having a horrible burning sensation in her esophagus and it is still to the point of not being able to eat or sleep. Patient is losing weight due to not being able to eat. Sometimes it will seem like it goes into a "remission" if you will and feels fine but it has come back so bad this time and nothing is helping.

## 2019-04-23 ENCOUNTER — Telehealth: Payer: Self-pay | Admitting: Internal Medicine

## 2019-04-23 NOTE — Telephone Encounter (Signed)
I would not expect that unless she has been exposed to someone with chlamydia.

## 2019-04-23 NOTE — Telephone Encounter (Signed)
Patient informed of MD response and stated understanding  

## 2019-04-23 NOTE — Telephone Encounter (Signed)
Patient states that she has been taking the omeprazole 1 in the am 1 in the pm since Friday. Doesn't know if she needs to give it more time or if she needs to see a specialist.   Patient also wants to know if it could possibly be chlamydia in the esophagus. States she was told this in the past by a ENT when this same thing was going on. Was just curious if that could cause the issues that she is having.

## 2019-04-23 NOTE — Telephone Encounter (Signed)
Should patient give medication more time to work or what would next step be?

## 2019-04-23 NOTE — Telephone Encounter (Signed)
Patient states she had a V visit with Dr. Sharlet Salina last week.  States medication was changed.  States she is still having burning pain in esophagus.  States she called over the weekend and was advised to go to the hospital.  Patient states she has had this issue for 10 years on and off and did not want to go to the hospital.  States she can not eat.  She would like to know if Dr. Sharlet Salina knows of anything else she can try and or if she needs to be referred to a specialist.

## 2019-04-23 NOTE — Telephone Encounter (Signed)
She can try doubling omeprazole and take 1 pill before breakfast and 1 pill before dinner and stop the pepcid

## 2019-04-23 NOTE — Telephone Encounter (Signed)
Yeah, can give this 1-2 weeks to help

## 2019-05-08 DIAGNOSIS — H6122 Impacted cerumen, left ear: Secondary | ICD-10-CM | POA: Diagnosis not present

## 2019-05-08 DIAGNOSIS — I10 Essential (primary) hypertension: Secondary | ICD-10-CM | POA: Diagnosis not present

## 2019-05-08 DIAGNOSIS — H6121 Impacted cerumen, right ear: Secondary | ICD-10-CM | POA: Diagnosis not present

## 2019-05-08 DIAGNOSIS — M545 Low back pain: Secondary | ICD-10-CM | POA: Diagnosis not present

## 2019-05-08 DIAGNOSIS — E785 Hyperlipidemia, unspecified: Secondary | ICD-10-CM | POA: Diagnosis not present

## 2019-05-08 DIAGNOSIS — D696 Thrombocytopenia, unspecified: Secondary | ICD-10-CM | POA: Diagnosis not present

## 2019-05-08 DIAGNOSIS — Z0000 Encounter for general adult medical examination without abnormal findings: Secondary | ICD-10-CM | POA: Diagnosis not present

## 2019-05-08 DIAGNOSIS — H9319 Tinnitus, unspecified ear: Secondary | ICD-10-CM | POA: Diagnosis not present

## 2019-05-08 DIAGNOSIS — F411 Generalized anxiety disorder: Secondary | ICD-10-CM | POA: Diagnosis not present

## 2019-05-09 DIAGNOSIS — N390 Urinary tract infection, site not specified: Secondary | ICD-10-CM | POA: Diagnosis not present

## 2019-05-10 MED ORDER — FAMOTIDINE 20 MG PO TABS: 20.0000 mg | ORAL_TABLET | Freq: Two times a day (BID) | ORAL | 3 refills | Status: DC

## 2019-05-10 NOTE — Telephone Encounter (Signed)
rx resent to correct pharmacy 

## 2019-05-10 NOTE — Telephone Encounter (Signed)
Pt stated that she did not know that Dr. Sharlet Salina had sent in a rx of famotidine (PEPCID) 20 MG tablet  [facility] She would like to know if [facility] as soon as possible as her throat is in excruciating pain and she cannot eat. She doesn't remember the rx being mentioned during her virtual appt 2 weeks ago. Please advise.   [id number] [place] [place] [phone] (Phone) [phone] (Fax)

## 2019-05-11 ENCOUNTER — Telehealth: Payer: Self-pay

## 2019-05-11 DIAGNOSIS — F5104 Psychophysiologic insomnia: Secondary | ICD-10-CM

## 2019-05-11 NOTE — Telephone Encounter (Signed)
Yes, we can check blood counts as well. The dosing on pepcid is fine. I'm glad she is not having symptoms.

## 2019-05-11 NOTE — Addendum Note (Signed)
Addended by: Pricilla Holm A on: 05/11/2019 02:55 PM   Modules accepted: Orders

## 2019-05-11 NOTE — Telephone Encounter (Signed)
Informed patient how to take her famotidine she just wants to make sure that this is not the class of medication that could cause esophageal cancer or stomach cancer.  Patient was wondering if there was a test she can do to check her level of seratin to see if it is low. Patient was told that this could cause her problems she is having. Patient also wanting to get her B12 levels checked.

## 2019-05-11 NOTE — Telephone Encounter (Signed)
Copied from [place] [phone]. Topic: General - Other >> May 11, 2019  8:49 AM Parke Poisson wrote: Reason for CRM: Pt is wanting instructions on how to take famotidine (PEPCID) 20 MG tablet

## 2019-05-11 NOTE — Telephone Encounter (Signed)
Patient informed of MD response and stated understanding. Patient is also wanting to know if she can get her platelets checked as they have been on the low side the last two times she had them checked.   Patient states that she has been taking the famotidine 2 pills in the am 2 pills in the pm. So 80mg  total and wanted to know if that is okay. States that she has not had any burning in since taking it that way but wanted to make sure it was okay

## 2019-05-11 NOTE — Addendum Note (Signed)
Addended by: Pricilla Holm A on: 05/11/2019 02:12 PM   Modules accepted: Orders

## 2019-05-11 NOTE — Telephone Encounter (Signed)
Does not cause stomach cancer. Labs placed to check.

## 2019-05-14 ENCOUNTER — Other Ambulatory Visit (INDEPENDENT_AMBULATORY_CARE_PROVIDER_SITE_OTHER): Payer: PPO

## 2019-05-14 DIAGNOSIS — F5104 Psychophysiologic insomnia: Secondary | ICD-10-CM | POA: Diagnosis not present

## 2019-05-14 DIAGNOSIS — Z803 Family history of malignant neoplasm of breast: Secondary | ICD-10-CM | POA: Diagnosis not present

## 2019-05-14 DIAGNOSIS — E781 Pure hyperglyceridemia: Secondary | ICD-10-CM

## 2019-05-14 DIAGNOSIS — Z1231 Encounter for screening mammogram for malignant neoplasm of breast: Secondary | ICD-10-CM | POA: Diagnosis not present

## 2019-05-14 LAB — LIPID PANEL
Cholesterol: 179 mg/dL (ref 0–200)
HDL: 37.3 mg/dL — ABNORMAL LOW (ref >39.00)
LDL Cholesterol: 119 mg/dL — ABNORMAL HIGH (ref 0–99)
NonHDL: 141.54
Total CHOL/HDL Ratio: 5
Triglycerides: 113 mg/dL (ref 0.0–149.0)
VLDL: 22.6 mg/dL (ref 0.0–40.0)

## 2019-05-14 LAB — COMPREHENSIVE METABOLIC PANEL
ALT: 10 U/L (ref 0–35)
AST: 13 U/L (ref 0–37)
Albumin: 3.6 g/dL (ref 3.5–5.2)
Alkaline Phosphatase: 84 U/L (ref 39–117)
BUN: 11 mg/dL (ref 6–23)
CO2: 27 mEq/L (ref 19–32)
Calcium: 9 mg/dL (ref 8.4–10.5)
Chloride: 103 mEq/L (ref 96–112)
Creatinine, Ser: 1.14 mg/dL (ref 0.40–1.20)
GFR: 57.38 mL/min — ABNORMAL LOW (ref >60.00)
Glucose, Bld: 81 mg/dL (ref 70–99)
Potassium: 3.8 mEq/L (ref 3.5–5.1)
Sodium: 138 mEq/L (ref 135–145)
Total Bilirubin: 0.4 mg/dL (ref 0.2–1.2)
Total Protein: 7 g/dL (ref 6.0–8.3)

## 2019-05-14 LAB — CBC
HCT: 40.9 % (ref 36.0–46.0)
Hemoglobin: 13.8 g/dL (ref 12.0–15.0)
MCHC: 33.7 g/dL (ref 30.0–36.0)
MCV: 86.1 fl (ref 78.0–100.0)
Platelets: 121 10*3/uL — ABNORMAL LOW (ref 150.0–400.0)
RBC: 4.75 Mil/uL (ref 3.87–5.11)
RDW: 13.8 % (ref 11.5–15.5)
WBC: 8 10*3/uL (ref 4.0–10.5)

## 2019-05-14 LAB — FERRITIN: Ferritin: 77.4 ng/mL (ref 10.0–291.0)

## 2019-05-14 LAB — HM MAMMOGRAPHY

## 2019-05-14 LAB — VITAMIN B12: Vitamin B-12: 365 pg/mL (ref 211–911)

## 2019-05-14 NOTE — Telephone Encounter (Signed)
There is not an indication to check urine without any symptoms.

## 2019-05-14 NOTE — Telephone Encounter (Signed)
Can patient get her urine checked because she was told she had trichomonas at from Dr. Valentino Saxon office. She states she has no idea how that is possible as she is not sexually active. Would like urine to be checked again last urine test was a week ago and they wanted her to come back and recheck but since she is coming in for blood work wanted to know if it could just be checked here.   FYI patient also states that she was doing so good with the throat burning then over the weekend it was so bad she almost went to the ED but was too scared to go. States if it does get that bad again she might go.

## 2019-05-14 NOTE — Telephone Encounter (Signed)
Patient will just get blood work done and get a hold of [place]'s office in regard to the urine test

## 2019-05-15 ENCOUNTER — Encounter: Payer: Self-pay | Admitting: Internal Medicine

## 2019-05-15 NOTE — Progress Notes (Signed)
Abstracted and sent to scan  

## 2019-05-17 ENCOUNTER — Telehealth: Payer: Self-pay

## 2019-05-17 NOTE — Telephone Encounter (Signed)
Ferritin is normal (<20 is low). Can take otc B12 or b-complex but B12 is not low.

## 2019-05-17 NOTE — Telephone Encounter (Signed)
Copied from [place] [id number]. Topic: General - Other >> May 17, 2019 10:45 AM Leward Quan A wrote: Reason for CRM: Patient called to request a call back to discuss her lab results. Ph# [phone]

## 2019-05-17 NOTE — Telephone Encounter (Signed)
Patient is wondering if she can do the shots for B12, if not what dose would you suggest. And do you have a brand that you would recommend, also wants to make sure this would not mess with her eyes.   Is talking to a holistic doctor and was told that her symptoms seems like low ferratin which is why she wanted it checked she wanted to know is 79 on the low end of normal? Should she do something to help that number as well like the B12?

## 2019-05-17 NOTE — Telephone Encounter (Signed)
Patient informed of MD response and stated understanding  

## 2019-05-18 DIAGNOSIS — R399 Unspecified symptoms and signs involving the genitourinary system: Secondary | ICD-10-CM | POA: Diagnosis not present

## 2019-05-18 DIAGNOSIS — N390 Urinary tract infection, site not specified: Secondary | ICD-10-CM | POA: Diagnosis not present

## 2019-05-18 NOTE — Telephone Encounter (Signed)
Patient is calling stating she would like to discuss more of her labs with Lesly Rubenstein as well as getting a copy of labs that were done sent to her. Call back is [phone].

## 2019-05-18 NOTE — Telephone Encounter (Signed)
Labs mailed to patient.

## 2019-05-22 ENCOUNTER — Telehealth: Payer: Self-pay | Admitting: Internal Medicine

## 2019-05-22 DIAGNOSIS — D696 Thrombocytopenia, unspecified: Secondary | ICD-10-CM

## 2019-05-22 MED ORDER — ALPRAZOLAM 1 MG PO TABS: 1.0000 mg | ORAL_TABLET | Freq: Three times a day (TID) | ORAL | 0 refills | Status: DC

## 2019-05-22 MED ORDER — ZOLPIDEM TARTRATE 10 MG PO TABS: 10.0000 mg | ORAL_TABLET | Freq: Every evening | ORAL | 0 refills | Status: DC | PRN

## 2019-05-22 NOTE — Telephone Encounter (Signed)
Pt called again stating she would like to speak with Briana. She said it is in regard to her xanax and [place]. She is needing refills and needs to explain why. Pt also said she has more questions about her Platelets count.   Copied from [place] [id number]. Topic: General - Other >> May 18, 2019  4:18 PM Gustavus Messing wrote: Reason for CRM: The patient would like a call back from DR.Crawford's nurse Lesly Rubenstein to call her back urgently on Tuesday morning. The patient did not want to speak to another nurse.

## 2019-05-22 NOTE — Telephone Encounter (Signed)
Sent in [place] and xanax

## 2019-05-22 NOTE — Telephone Encounter (Signed)
Patient wanting to know if she can get one more month for her xanax and Ambien refills due soon. Still waiting for her appointment with her new doctor, states that she was scheduled with one but then that doctor was sent to another offices and is scheduled with another doctor. Did get a 7 day supply from the [place] but it will not last till her appointment.   Control database checked last refill: 05/18/2019 for both medications and was a seven day supply.

## 2019-05-22 NOTE — Telephone Encounter (Signed)
Patient informed medication was sent

## 2019-05-24 ENCOUNTER — Telehealth: Payer: Self-pay

## 2019-05-24 NOTE — Telephone Encounter (Signed)
LVM for patient to call back. ?

## 2019-05-24 NOTE — Telephone Encounter (Signed)
Patient returning a call to [name]. Please advise.

## 2019-05-24 NOTE — Telephone Encounter (Signed)
Copied from [place] [phone]. Topic: General - Other >> May 24, 2019 10:18 AM Sheran Luz wrote: Patient requesting call back from [place]. She would not disclose any additional information other than it was regarding uranalysis.

## 2019-05-25 ENCOUNTER — Ambulatory Visit (INDEPENDENT_AMBULATORY_CARE_PROVIDER_SITE_OTHER): Payer: PPO | Admitting: Internal Medicine

## 2019-05-25 ENCOUNTER — Encounter: Payer: Self-pay | Admitting: Internal Medicine

## 2019-05-25 DIAGNOSIS — A599 Trichomoniasis, unspecified: Secondary | ICD-10-CM

## 2019-05-25 MED ORDER — METRONIDAZOLE 0.75 % VA GEL: 1.0000 | Freq: Two times a day (BID) | VAGINAL | 0 refills | Status: DC

## 2019-05-25 NOTE — Assessment & Plan Note (Signed)
We talked about the etiology of this. Talked about potential treatment options. Likely failure as Gabrielle [name] was treated with metronidazole gel previously. Did not tolerate oral metronidazole before and refuses even 2G oral dose. Ordered metronidazole gel and Gabrielle [name] is aware that cure rate is generally 50% or less with this option.

## 2019-05-25 NOTE — Progress Notes (Signed)
Virtual Visit via Video Note  I connected with Gabrielle [name] on 05/25/19 at  2:40 PM EDT by a video enabled telemedicine application and verified that I am speaking with the correct person using two identifiers.  The patient and the provider were at separate locations throughout the entire encounter.   I discussed the limitations of evaluation and management by telemedicine and the availability of in person appointments. The patient expressed understanding and agreed to proceed.  History of Present Illness: The patient is a 68 y.o. female with visit for trichomonas in urine at nephrology. Started several weeks ago with testing and was confirmed on second testing. She is concerned about this. Not currently sexually active and does not know where this came. Has no symptoms with this. Denies fevers or chills or abnormal odor. Has tried metronidazole oral before and did not tolerate well. Has used the gel in the past which did not bother her.   Observations/Objective: Appearance: normal, breathing appears normal, casual grooming, abdomen does not appear distended, mental status is A and O times 3, EOM intact  Assessment and Plan: See problem oriented charting  Follow Up Instructions: metronidazole gel  Visit time 25 minutes: greater than 50% of that time was spent in face to face counseling and coordination of care with the patient: counseled about trichomonas and also risks and benefits and potential risk of curing with each treatment option  I discussed the assessment and treatment plan with the patient. The patient was provided an opportunity to ask questions and all were answered. The patient agreed with the plan and demonstrated an understanding of the instructions.   The patient was advised to call back or seek an in-person evaluation if the symptoms worsen or if the condition fails to improve as anticipated.  [name], MD

## 2019-05-25 NOTE — Telephone Encounter (Signed)
Patient scheduled today for doxy visit   Called patient back she states that she had another urine test done at Dr. Varney Daily office and the trich was still showing up. Patient was told to follow up with PCP.  Patient is also having a slight cough that started a couple days ago, no other covid symptoms and states did go outside yesterday but it was not for long and has been inside mostly.

## 2019-05-28 ENCOUNTER — Telehealth: Payer: Self-pay | Admitting: *Deleted

## 2019-05-28 DIAGNOSIS — Z9889 Other specified postprocedural states: Secondary | ICD-10-CM | POA: Diagnosis not present

## 2019-05-28 DIAGNOSIS — H04123 Dry eye syndrome of bilateral lacrimal glands: Secondary | ICD-10-CM | POA: Diagnosis not present

## 2019-05-28 DIAGNOSIS — H02834 Dermatochalasis of left upper eyelid: Secondary | ICD-10-CM | POA: Diagnosis not present

## 2019-05-28 DIAGNOSIS — L905 Scar conditions and fibrosis of skin: Secondary | ICD-10-CM | POA: Diagnosis not present

## 2019-05-28 DIAGNOSIS — H02831 Dermatochalasis of right upper eyelid: Secondary | ICD-10-CM | POA: Diagnosis not present

## 2019-05-28 DIAGNOSIS — H16223 Keratoconjunctivitis sicca, not specified as Sjogren's, bilateral: Secondary | ICD-10-CM | POA: Diagnosis not present

## 2019-05-28 NOTE — Telephone Encounter (Signed)
The cough is coming from the heart burn so she does not need a cough medicine.

## 2019-05-28 NOTE — Telephone Encounter (Signed)
Copied from [place] [phone]. Topic: General - Other >> May 25, 2019  4:41 PM Keene Breath wrote: Reason for CRM: Patient called after hours to ask the doctor to send in a script for cough medicine.  She stated that she discussed this with the doctor earlier.  CB# [phone]

## 2019-06-04 DIAGNOSIS — Z961 Presence of intraocular lens: Secondary | ICD-10-CM | POA: Diagnosis not present

## 2019-06-04 DIAGNOSIS — H04123 Dry eye syndrome of bilateral lacrimal glands: Secondary | ICD-10-CM | POA: Diagnosis not present

## 2019-06-04 DIAGNOSIS — H43811 Vitreous degeneration, right eye: Secondary | ICD-10-CM | POA: Diagnosis not present

## 2019-06-04 DIAGNOSIS — H40053 Ocular hypertension, bilateral: Secondary | ICD-10-CM | POA: Diagnosis not present

## 2019-06-04 DIAGNOSIS — D3132 Benign neoplasm of left choroid: Secondary | ICD-10-CM | POA: Diagnosis not present

## 2019-06-04 DIAGNOSIS — H47012 Ischemic optic neuropathy, left eye: Secondary | ICD-10-CM | POA: Diagnosis not present

## 2019-06-06 ENCOUNTER — Telehealth: Payer: Self-pay | Admitting: Internal Medicine

## 2019-06-06 NOTE — Telephone Encounter (Signed)
Copied from [place] [id number]. Topic: Quick Communication - Rx Refill/Question >> Jun 06, 2019  9:28 AM Burchel, Abbi R wrote: Medication: Nortriptyline  Pt states she was prescribed this medication by Dr Sharlet Salina and has some questions she would like to ask about it before taking it. Please call pt:   [phone]

## 2019-06-07 NOTE — Telephone Encounter (Signed)
Pt called to check status, requesting call back from [place]. Please advise

## 2019-06-08 NOTE — Telephone Encounter (Signed)
I do not have a lot of information about eye conditions and other than just general information with side effects of medications (dry eye is typically listed at some percentage) I cannot speak to that.

## 2019-06-08 NOTE — Telephone Encounter (Signed)
[facility] for her to try and get off the xanax and Ambien and to help with anxiety and depression. Today is day three on medication.   Dr. [name] sent patient to dry eye specialist  States was told could take amitrptaline by the dry eye specialist to help with dry eye.since patient is already on the nortriptyline was told she can just continue to take that instead of the amitriptyline.   Patient talk to pharmacist about the medication and was told by them that a side effect of that nortyptaline is dry eyes, was waiting to hear from the specialist, but instead of answering her calls was told she was sent a letter that she has not received back yet and does not know what the letter is about.   Patient is wanting to know if nortriptyline can help with dry eye and Meibomian Gland Dysfunction if so how? If a side effect is dry eye or not. States she knows that you are not an eye doctor but did not know if you had a way of looking it up or have any knowledge at all, as she is desperate to get some answers and feels like she is not getting any.

## 2019-06-08 NOTE — Telephone Encounter (Signed)
Patient informed of Md response and stated understanding. Will try and get ahole of her eye specialist, pharmacist and [place] doctor

## 2019-06-08 NOTE — Telephone Encounter (Signed)
I do not have this medicine on her list. Did someone else prescribe it to her?

## 2019-06-08 NOTE — Telephone Encounter (Signed)
Is patient supposed to be on the Nortriptyline?

## 2019-06-20 ENCOUNTER — Telehealth: Payer: Self-pay | Admitting: Internal Medicine

## 2019-06-20 NOTE — Telephone Encounter (Signed)
Pt is requesting Gabrielle [name] to return her call concerning alprazolam and zolpidem. Pt does not want to elaborate with me her concerns

## 2019-06-20 NOTE — Telephone Encounter (Signed)
I called pt- she states she has been trying to get weaned off of Alprazolam and Zolpidem and is currently tapering down. However, she is requesting refills be sent to [facility] [address] for July. Please advise.   [place] Controlled Database Checked Last filled: Zolpidem # 30 05/22/19                   Alprazolam # 90 05/22/19 LOV w/you: 05/25/19 Next appt w/you: None

## 2019-06-21 NOTE — Telephone Encounter (Signed)
Is she seeing psych already? They should prescribe if they are the ones working with her on tapering the dosage of these.

## 2019-06-21 NOTE — Telephone Encounter (Signed)
We cannot do this over a phone note. Her psych was to start prescribing these medications for her once she was under their care. If she wishes [place] to continue prescribing she needs some kind of encounter and especially if she is needing advice on how to taper (this should come from her psych as they are the ones managing her meds at this time).

## 2019-06-21 NOTE — Telephone Encounter (Signed)
Appointment set for tomorrow for virtual visit per patient's request.

## 2019-06-21 NOTE — Telephone Encounter (Signed)
Spoke with patient. She now states that she saw a "tapering" therapist and not her actually psych today for the virtual visit. She states she would like for you to refill her meds one more time until she see actual psych. Do you want me to set up her for a visit (virtual or office to address the tapering down) or just reiterate to her again to stick with the [facility] and let them manage her now.

## 2019-06-21 NOTE — Telephone Encounter (Signed)
Can schedule visit to clarify this issue.

## 2019-06-21 NOTE — Telephone Encounter (Signed)
Spoke with patient today and she did a virtual with psych from the [facility]. She said they recommended that she contact you to get her medication tapered down so she can start to come off of it. She said she was told she could stop the Ambien at anytime and wouldn't have any withdrawals. However she wanted to come off the Ambien and Xanax at the same time.   Please advise if you are fine with tapering her meds down as she states she really wants to come off of both of them if possible.

## 2019-06-22 ENCOUNTER — Encounter: Payer: Self-pay | Admitting: Internal Medicine

## 2019-06-22 ENCOUNTER — Ambulatory Visit (INDEPENDENT_AMBULATORY_CARE_PROVIDER_SITE_OTHER): Payer: PPO | Admitting: Internal Medicine

## 2019-06-22 DIAGNOSIS — F4312 Post-traumatic stress disorder, chronic: Secondary | ICD-10-CM

## 2019-06-22 DIAGNOSIS — F5104 Psychophysiologic insomnia: Secondary | ICD-10-CM | POA: Diagnosis not present

## 2019-06-22 MED ORDER — ALPRAZOLAM 1 MG PO TABS: 1.0000 mg | ORAL_TABLET | Freq: Three times a day (TID) | ORAL | 2 refills | Status: DC

## 2019-06-22 MED ORDER — ZOLPIDEM TARTRATE 10 MG PO TABS: 10.0000 mg | ORAL_TABLET | Freq: Every evening | ORAL | 2 refills | Status: DC | PRN

## 2019-06-22 NOTE — Progress Notes (Signed)
Virtual Visit via Video Note  I connected with Gabrielle [name] on 06/22/19 at 10:40 AM EDT by a video enabled telemedicine application and verified that I am speaking with the correct person using two identifiers.  The patient and the provider were at separate locations throughout the entire encounter.   I discussed the limitations of evaluation and management by telemedicine and the availability of in person appointments. The patient expressed understanding and agreed to proceed.  History of Present Illness: The patient is a 68 y.o. female with visit for PTSD and insomnia. Started years ago. Has been seeing psychiatry and they were shut down due to irregular prescribing patterns. She was off xanax at one point but then last year had problems and complications of an eye surgery and almost lost vision in that eye. This caused relapse and she has been on xanax since that time. She was supposed to be getting in with a new pyschiatry provider and we agreed to prescribe this until she could get back in with someone. She was supposed to see them months ago which got delayed due to covid-19. She then started seeing someone and this is a counseling program through the [place] on tapering from meds. Denies actually tapering at this time. She is still very upset about her eye function and has not been able to get any answers from anyone she has seen about this yet. Overall it is stable. Is trying some homeopathic treatment she ordered online to help her cope with anxiety and depression so she can get off meds. She is getting meds from the [place] counseling program and is currently taking citalopram 5 mg daily.   Observations/Objective: Appearance: normal, breathing appears normal, casual grooming, abdomen does not appear distended, throat normal, memory normal, mental status is A and O times 3, at times anxious  Assessment and Plan: See problem oriented charting  Follow Up Instructions: Refill ambien and xanax for 3  months  Visit time 25 minutes: greater than 50% of that time was spent in face to face counseling and coordination of care with the patient: counseled about anxiety and depression, as well as the course of her vision and attempts to find answers and providers for this  I discussed the assessment and treatment plan with the patient. The patient was provided an opportunity to ask questions and all were answered. The patient agreed with the plan and demonstrated an understanding of the instructions.   The patient was advised to call back or seek an in-person evaluation if the symptoms worsen or if the condition fails to improve as anticipated.  [name], MD

## 2019-06-22 NOTE — Assessment & Plan Note (Signed)
Refill ambien at this time for 3 months. She is working on alternate ways to fall asleep and we will monitor progress. She is aware of the risks of this treatment and accepts those.

## 2019-06-22 NOTE — Assessment & Plan Note (Signed)
Refill xanax today and encouraged her on her journey. She is still awaiting visit with psychiatry at this time. She is working on homeopathic treatment options to help with her symptoms.

## 2019-07-11 DIAGNOSIS — H04123 Dry eye syndrome of bilateral lacrimal glands: Secondary | ICD-10-CM | POA: Diagnosis not present

## 2019-07-11 DIAGNOSIS — H02825 Cysts of left lower eyelid: Secondary | ICD-10-CM | POA: Diagnosis not present

## 2019-07-11 DIAGNOSIS — H5442A3 Blindness left eye category 3, normal vision right eye: Secondary | ICD-10-CM | POA: Diagnosis not present

## 2019-07-11 DIAGNOSIS — H02822 Cysts of right lower eyelid: Secondary | ICD-10-CM | POA: Diagnosis not present

## 2019-07-11 DIAGNOSIS — H43811 Vitreous degeneration, right eye: Secondary | ICD-10-CM | POA: Diagnosis not present

## 2019-07-18 ENCOUNTER — Telehealth: Payer: Self-pay

## 2019-07-18 NOTE — Telephone Encounter (Signed)
Copied from [place] [id number]. Topic: General - Other >> Jul 17, 2019  3:27 PM Leward Quan A wrote: Reason for CRM: Patient called to speak to Lesly Rubenstein states that it have to do with her Tinnitus and Shingles shot. Asking for a call back at Ph# [phone]  Routing to briana, I contacted patient to see if I could help/answer questions while both briana and dr Sharlet Salina are not in office this week, patient wants to wait for briana's return on Monday 07/23/19---please call patient as soon as you can, thanks

## 2019-07-23 NOTE — Telephone Encounter (Signed)
Called patient back and she was questioning getting the shingles vaccine and wanted to know if it had any effects on the eyes.   Patient states the tinnitus is getting really bad and wanting to know if there is any one she can be referred to or what is the next step. States that she feels like she has bees buzzing in her head it gets really bad. Patient wanting to know about Notch therapy states it reduced tinnitus sounds wanted to know what your thoughts where of getting a treatment done

## 2019-07-24 NOTE — Telephone Encounter (Signed)
Patient informed of Md response and stated understanding  

## 2019-07-24 NOTE — Telephone Encounter (Signed)
I have not hear of notch therapy. Shingrix does not affect the eyes at all.

## 2019-08-01 ENCOUNTER — Other Ambulatory Visit: Payer: Self-pay

## 2019-08-01 DIAGNOSIS — Z20822 Contact with and (suspected) exposure to covid-19: Secondary | ICD-10-CM

## 2019-08-02 LAB — NOVEL CORONAVIRUS, NAA: SARS-CoV-2, NAA: NOT DETECTED

## 2019-08-03 ENCOUNTER — Telehealth: Payer: Self-pay | Admitting: Internal Medicine

## 2019-08-03 NOTE — Telephone Encounter (Signed)
Relation to pt: self  Call back number: [phone]    Reason for call:  Patient would like her My Chart updated reflecting she had HEP C vaccination, please note when completed. Patient requesting to speak to "[place]"

## 2019-08-06 DIAGNOSIS — H47012 Ischemic optic neuropathy, left eye: Secondary | ICD-10-CM | POA: Diagnosis not present

## 2019-08-06 DIAGNOSIS — H43811 Vitreous degeneration, right eye: Secondary | ICD-10-CM | POA: Diagnosis not present

## 2019-08-06 DIAGNOSIS — Z961 Presence of intraocular lens: Secondary | ICD-10-CM | POA: Diagnosis not present

## 2019-08-06 DIAGNOSIS — H40053 Ocular hypertension, bilateral: Secondary | ICD-10-CM | POA: Diagnosis not present

## 2019-08-06 DIAGNOSIS — D3132 Benign neoplasm of left choroid: Secondary | ICD-10-CM | POA: Diagnosis not present

## 2019-08-06 DIAGNOSIS — H04123 Dry eye syndrome of bilateral lacrimal glands: Secondary | ICD-10-CM | POA: Diagnosis not present

## 2019-08-06 NOTE — Telephone Encounter (Signed)
LVM letting patient know that we need to have the form with the HEP C vaccination with the date on it from when she received it.

## 2019-08-07 NOTE — Telephone Encounter (Signed)
Pt called back and was given info from [place]..  That is not what she wanted to know if there is a hep C vaccine. I do not know of a Hep C vaccine that is available, but she hung up.

## 2019-08-09 ENCOUNTER — Telehealth: Payer: Self-pay | Admitting: Internal Medicine

## 2019-08-09 NOTE — Telephone Encounter (Signed)
Pt wants to know if getting shingles shot will make teniasis worse.

## 2019-08-10 NOTE — Telephone Encounter (Signed)
Will shingles shot cause patients tinnitus to be worse?

## 2019-08-10 NOTE — Telephone Encounter (Signed)
No, this should not affect one way or another the tinnitus.

## 2019-08-10 NOTE — Telephone Encounter (Signed)
Patient informed of MD response stated understanding. States the day after her vaccine she had worse ringing. Has gotten better.  Patient did not know if you knew anything about the pituitary gland and tinnitus. States she had lack of sella in the brain in the MRI.

## 2019-08-14 DIAGNOSIS — R634 Abnormal weight loss: Secondary | ICD-10-CM | POA: Diagnosis not present

## 2019-08-14 DIAGNOSIS — Z96651 Presence of right artificial knee joint: Secondary | ICD-10-CM | POA: Diagnosis not present

## 2019-08-14 DIAGNOSIS — M8589 Other specified disorders of bone density and structure, multiple sites: Secondary | ICD-10-CM | POA: Diagnosis not present

## 2019-08-14 DIAGNOSIS — Z90710 Acquired absence of both cervix and uterus: Secondary | ICD-10-CM | POA: Diagnosis not present

## 2019-08-14 DIAGNOSIS — R29890 Loss of height: Secondary | ICD-10-CM | POA: Diagnosis not present

## 2019-08-14 LAB — HM DEXA SCAN: HM Dexa Scan: -1.6

## 2019-08-15 ENCOUNTER — Ambulatory Visit (INDEPENDENT_AMBULATORY_CARE_PROVIDER_SITE_OTHER): Payer: PPO | Admitting: Internal Medicine

## 2019-08-15 ENCOUNTER — Other Ambulatory Visit: Payer: Self-pay

## 2019-08-15 ENCOUNTER — Encounter: Payer: Self-pay | Admitting: Internal Medicine

## 2019-08-15 ENCOUNTER — Telehealth: Payer: Self-pay

## 2019-08-15 DIAGNOSIS — T8090XA Unspecified complication following infusion and therapeutic injection, initial encounter: Secondary | ICD-10-CM | POA: Diagnosis not present

## 2019-08-15 NOTE — Telephone Encounter (Signed)
Patient called back states that got a copy of the letter from [place] from her MRI stating with the contrast dye it showed partially empty sella noted  (which means there Is increased amount of spinal fluid over pituitary gland)  and mild global brain parenchymal loss. Patient is wanting to bring the results to [place] and see if you can give her more information on it. Wants to know if she needs to worry because of her age and if you knew if this could possibly cause anything with her tinnitus to be bad?

## 2019-08-15 NOTE — Progress Notes (Signed)
Subjective:    Patient ID: Gabrielle [name], female    DOB: [birth year], 68 y.o.   MRN: [id number]  HPI The patient is here for an acute visit.  Reaction to shingrix:  She got the first shingles injection one week ago. She noticed redness at the site a couple of days ago.  The area is large and red. The area got slightly larger over the past day.   It feels a little warm.  She states minimal pain.  She denies swelling.  She has not taken anything for it or put anything on it.    Her tinnitus has gotten worse.  When she wakes up in the morning the ringing feels like it is in her brain not her ears.  She is concerned that that is also a side effect from the vaccine.  She wanted to make sure she did not need to do anything about the reaction to the injection.  She did have some mild symptoms and felt sluggish after the injection, but that passed quickly.  Medications and allergies reviewed with patient and updated if appropriate.  Patient Active Problem List   Diagnosis Date Noted  . Malodorous urine 12/15/2018  . Elevated blood pressure reading in office without diagnosis of hypertension 12/15/2018  . Blindness 09/14/2018  . GERD (gastroesophageal reflux disease) 04/21/2018  . Chronic post-traumatic stress disorder (PTSD) 03/14/2018  . Trichomonas infection 03/14/2018  . Tinnitus 03/14/2018  . Routine general medical examination at a health care facility 03/14/2018  . Abnormality of gait 06/03/2015  . S/P TKR (total knee replacement) 06/03/2015  . Chronic left SI joint pain 06/03/2015  . Chronic insomnia 10/11/2014  . DJD (degenerative joint disease) of knee 03/13/2014    Current Outpatient Medications on File Prior to Visit  Medication Sig Dispense Refill  . ALPRAZolam (XANAX) 1 MG tablet Take 1 tablet (1 mg total) by mouth 3 (three) times daily. 90 tablet 2  . brimonidine-timolol (COMBIGAN) 0.2-0.5 % ophthalmic solution Apply to eye.    . cholecalciferol (VITAMIN D) 1000 UNITS  tablet Take 2,000 Units by mouth daily. Reported on 06/01/2016    . dorzolamide (TRUSOPT) 2 % ophthalmic solution Apply to eye.    . famotidine (PEPCID) 20 MG tablet Take 1 tablet (20 mg total) by mouth 2 (two) times daily. 180 tablet 3  . ibuprofen (ADVIL,MOTRIN) 600 MG tablet Take 1 tablet (600 mg total) by mouth every 6 (six) hours as needed. 30 tablet 0  . meclizine (ANTIVERT) 12.5 MG tablet Take 1 tablet (12.5 mg total) by mouth 3 (three) times daily as needed for dizziness. 15 tablet 0  . metroNIDAZOLE (METROGEL) 0.75 % vaginal gel Place 1 Applicatorful vaginally 2 (two) times daily. 70 g 0  . omeprazole (PRILOSEC) 40 MG capsule Take 1 capsule (40 mg total) by mouth daily. 30 capsule 3  . polyethylene glycol (MIRALAX) packet Take 17 g by mouth daily as needed.     . RESTASIS 0.05 % ophthalmic emulsion     . sucralfate (CARAFATE) 1 GM/10ML suspension Take 10 mLs (1 g total) by mouth 4 (four) times daily -  with meals and at bedtime. 420 mL 0  . zolpidem (AMBIEN) 10 MG tablet Take 1-1.5 tablets (10-15 mg total) by mouth at bedtime as needed for sleep. May take 1 and 1/2 tab = 15 mg 45 tablet 2   No current facility-administered medications on file prior to visit.     Past Medical History:  Diagnosis Date  .  Anemia    as a child  . Anxiety    takes Xanax daily  . Arthritis   . Cataracts, bilateral    immature  . Chronic insomnia 10/11/2014  . Complication of anesthesia   . Constipation    will occasionally take Milk of Mag  . Diverticulosis   . Dizziness    rarely  . GERD (gastroesophageal reflux disease)    takes Omeprazole every other day  . Glaucoma    borderline and no drops required  . Hearing loss    but doesn't have hearing aids  . History of bronchitis    many many yrs ago  . History of colitis   . History of colon polyps   . Hypertension    hasn't been on meds for the past 58yrs   . Insomnia    takes Trazodone nightly as needed and Ambien nightly   . Insomnia due  to substance 10/11/2014  . Joint pain   . Joint swelling   . PONV (postoperative nausea and vomiting)   . Tinnitus    takes HCTZ daily to decrease pressure in ears    Past Surgical History:  Procedure Laterality Date  . ABDOMINAL HYSTERECTOMY    . APPENDECTOMY    . COLONOSCOPY    . IR RADIOLOGIST EVAL & MGMT  06/08/2017  . tinnitus Right   . TONSILLECTOMY    . TOTAL KNEE ARTHROPLASTY Right 03/13/2014   DR MURPHY  . TOTAL KNEE ARTHROPLASTY Right 03/13/2014   Procedure: TOTAL KNEE ARTHROPLASTY;  Surgeon: [name], MD;  Location: [facility];  Service: Orthopedics;  Laterality: Right;  . TUBAL LIGATION      Social History   Socioeconomic History  . Marital status: Divorced    Spouse name: Not on file  . Number of children: Not on file  . Years of education: Not on file  . Highest education level: Not on file  Occupational History  . Occupation: retired  Scientific laboratory technician  . Financial resource strain: Not hard at all  . Food insecurity    Worry: Never true    Inability: Never true  . Transportation needs    Medical: No    Non-medical: No  Tobacco Use  . Smoking status: Former Smoker    Packs/day: 1.00    Years: 22.00    Pack years: 22.00    Types: Cigarettes    Start date: 03/08/1972    Quit date: 01/08/1994    Years since quitting: 25.6  . Smokeless tobacco: Never Used  . Tobacco comment: quit smoking 27yrs ago  Substance and Sexual Activity  . Alcohol use: No    Alcohol/week: 0.0 standard drinks  . Drug use: No  . Sexual activity: Not Currently    Birth control/protection: Surgical  Lifestyle  . Physical activity    Days per week: 3 days    Minutes per session: 40 min  . Stress: To some extent  Relationships  . Social connections    Talks on phone: More than three times a week    Gets together: More than three times a week    Attends religious service: 1 to 4 times per year    Active member of club or organization: Yes    Attends meetings of clubs or  organizations: 1 to 4 times per year    Relationship status: Divorced  Other Topics Concern  . 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 Cancer - Other Father   . Kidney disease Father   . Cancer - Other Sister        breast ca  . Cancer - Other Brother        lymphoma in remission  . Cancer - Other Sister        in remission breast ca    Review of Systems  Constitutional: Negative for chills, fatigue and fever.  Musculoskeletal: Negative for myalgias.  Skin: Positive for rash.       Objective:   Vitals:   08/15/19 1420  BP: (!) 160/90  Pulse: 71  Temp: 98.2 F (36.8 C)  SpO2: 98%   BP Readings from Last 3 Encounters:  08/15/19 (!) 160/90  12/15/18 (!) 144/88  10/06/18 120/80   Wt Readings from Last 3 Encounters:  08/15/19 210 lb (95.3 kg)  12/15/18 214 lb (97.1 kg)  10/06/18 219 lb (99.3 kg)   Body mass index is 36.05 kg/m.   Physical Exam Constitutional:      General: She is not in acute distress.    Appearance: Normal appearance. She is not ill-appearing, toxic-appearing or diaphoretic.  HENT:     Head: Normocephalic and atraumatic.  Skin:    General: Skin is warm and dry.     Findings: Erythema (Grapefruit sized area of erythema left upper arm.  No swelling or induration.  No wound.  No lesions.  Slight warmth.  No tenderness.) present. No lesion.  Neurological:     Mental Status: She is alert.     Sensory: No sensory deficit.            Assessment & Plan:    See Problem List for Assessment and Plan of chronic medical problems.

## 2019-08-15 NOTE — Patient Instructions (Addendum)
Your rash is likely an injection site reaction.    Apply ice or cold packs.     You can take tylenol as needed. You can put benadryl cream or cortisone cream on it if you needed.   Monitor your rash closely - if it gets worse please call.    If you have any questions please call.

## 2019-08-15 NOTE — Progress Notes (Signed)
Abstracted and sent to scan  

## 2019-08-15 NOTE — Assessment & Plan Note (Signed)
Exam consistent with an injection site reaction to the first shingles vaccine There is redness, slight warmth, but no significant pain or swelling No fever No evidence of cellulitis Symptomatic treatment-apply ice or cold packs Can use Benadryl or cortisone cream She will monitor closely and if she develops any new symptoms or the area of erythema increases she will call

## 2019-08-15 NOTE — Telephone Encounter (Signed)
Copied from [place] [id number]. Topic: General - Other >> Aug 15, 2019  8:59 AM Sheran Luz wrote: Patient requesting call back from [place]. She would not disclose any additional information.

## 2019-08-20 ENCOUNTER — Telehealth: Payer: Self-pay | Admitting: Internal Medicine

## 2019-08-20 NOTE — Telephone Encounter (Signed)
Patient called and would like a call back from [place] about test results and has questions about the shingle vaccine. Please call patient back, thanks.

## 2019-08-21 NOTE — Telephone Encounter (Signed)
Is there anything that can be done for this?

## 2019-08-21 NOTE — Telephone Encounter (Signed)
This finding can indicate some narrowing of the lateral sinus which has been associated with pulsatile tinnitus.

## 2019-08-21 NOTE — Telephone Encounter (Signed)
Patient wanted to know results of Dexa I let her know they came back as -1.6, but patient wants to know exactly what that means

## 2019-08-21 NOTE — Telephone Encounter (Signed)
She would have to talk to an ENT as this is only a correlation and is not the only cause of tinnitus.

## 2019-08-21 NOTE — Telephone Encounter (Signed)
I don't actually see a report of the dexa just a number. So I cannot analyze. If she knows where she got it we can get records. The other possibility is that they have gone to scan and are not in computer yet but will be there in several weeks.

## 2019-08-23 ENCOUNTER — Telehealth: Payer: Self-pay | Admitting: Internal Medicine

## 2019-08-23 NOTE — Telephone Encounter (Signed)
I faxed these results to patients ENT as requested by patient so they can have the MRI results as well to talk over with patient.

## 2019-08-23 NOTE — Telephone Encounter (Signed)
Patient is calling for Gabrielle [name] - Patient is requesting the result of her bone density test. Please advise [phone]

## 2019-08-23 NOTE — Telephone Encounter (Signed)
Her lowest number for her bones on the dexa is -1.6. -1.5 or below is considered osteopenia and she should have a bone density again in 2 years. Make sure to take calcium and vitamin D and do weight bearing exercise 3-5 times per week to help with bone strength.

## 2019-08-23 NOTE — Telephone Encounter (Signed)
Patient informed of MD response and stated understanding  
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
## 2019-08-27 ENCOUNTER — Other Ambulatory Visit: Payer: Self-pay

## 2019-08-27 ENCOUNTER — Telehealth (INDEPENDENT_AMBULATORY_CARE_PROVIDER_SITE_OTHER): Payer: PPO | Admitting: Neurology

## 2019-08-27 ENCOUNTER — Telehealth: Payer: Self-pay | Admitting: Neurology

## 2019-08-27 ENCOUNTER — Telehealth: Payer: Self-pay | Admitting: Internal Medicine

## 2019-08-27 ENCOUNTER — Encounter: Payer: Self-pay | Admitting: Neurology

## 2019-08-27 VITALS — Ht 64.0 in | Wt 200.0 lb

## 2019-08-27 DIAGNOSIS — H9313 Tinnitus, bilateral: Secondary | ICD-10-CM | POA: Diagnosis not present

## 2019-08-27 NOTE — Telephone Encounter (Signed)
Called spoke with patient she states that she would like to go forth with the prednisone taper as discussed during appt today. Pt states that she called Dr. Constance Holster office they  haven't called her back.   Her only concerns is if this Rx will interfere with her vision or back her hearing worst.  Pharmacy verified please send

## 2019-08-27 NOTE — Telephone Encounter (Signed)
Patient needs to speak to someone about the medication that Dr Tomi Likens is calling in for her

## 2019-08-27 NOTE — Telephone Encounter (Signed)
Patient called stating her nuerologist prescribed her a medication (she does not know name) and patient she would like to speak to PCP or nurse before she took medication to make sure they approved, Patient call back [phone]

## 2019-08-28 ENCOUNTER — Other Ambulatory Visit: Payer: Self-pay | Admitting: Neurology

## 2019-08-28 MED ORDER — PREDNISONE 10 MG PO TABS: ORAL_TABLET | ORAL | 0 refills | Status: DC

## 2019-08-28 NOTE — Telephone Encounter (Signed)
I sent in a prednisone taper (10mg  tablets): Take 60mg  on day 1, then 50mg  on day 2, then 40mg  on day 3, then 30mg  on day 4, then 20mg  on day 5, then 10mg  on day 6, then STOP

## 2019-08-28 NOTE — Telephone Encounter (Signed)
Called patient and informed of MD response and stated understanding did take the first six pills today called her eye doctor and he said it was okay for him to start. Told patient to let us know how she is doing patient stated understanding

## 2019-08-28 NOTE — Telephone Encounter (Signed)
Pt would like Briana to call her to discuss the medication prednisone itself and ask about the flu shot

## 2019-08-28 NOTE — Telephone Encounter (Signed)
Patient was given prednisone

## 2019-08-28 NOTE — Telephone Encounter (Signed)
Dose, Route, Frequency: As Directed  Dispense Quantity: 21 tablet Refills: 0 Fills remaining: --        Sig: Take 60mg  on day 1, then 50mg  on day 2, then 40mg  on day 3, then 30mg  on day 4, then 20mg  on day 5, then 10mg  on day 6, then STOP       Written Date: 08/28/19 Expiration Date: 08/27/20    Start Date: 08/28/19 End Date: --         Ordering Provider: -- DEA #:  -- NPI:  --   Authorizing Provider: Pieter Partridge, DO DEA #:  [id number] NPI:  [id number]   Ordering User:  Pieter Partridge, DO            Pharmacy:  [id number] [address], [address] #:  [id number]

## 2019-08-28 NOTE — Telephone Encounter (Signed)
Please send [facility] to the [facility] on [address]. Please.

## 2019-08-28 NOTE — Telephone Encounter (Signed)
This is okay to take although prednisone does generally increase the pressure in the eyes. Is there a particular reason they want you to take it? It looks like they prescribed for tinnitus but I do not think it will help with this. If for another reason would be okay to try but would need monitoring of eye pressure.

## 2019-08-29 DIAGNOSIS — D696 Thrombocytopenia, unspecified: Secondary | ICD-10-CM | POA: Diagnosis not present

## 2019-08-29 DIAGNOSIS — I10 Essential (primary) hypertension: Secondary | ICD-10-CM | POA: Diagnosis not present

## 2019-08-29 DIAGNOSIS — H9319 Tinnitus, unspecified ear: Secondary | ICD-10-CM | POA: Diagnosis not present

## 2019-08-29 DIAGNOSIS — E785 Hyperlipidemia, unspecified: Secondary | ICD-10-CM | POA: Diagnosis not present

## 2019-08-29 DIAGNOSIS — M545 Low back pain: Secondary | ICD-10-CM | POA: Diagnosis not present

## 2019-08-29 DIAGNOSIS — Z0000 Encounter for general adult medical examination without abnormal findings: Secondary | ICD-10-CM | POA: Diagnosis not present

## 2019-08-29 DIAGNOSIS — F411 Generalized anxiety disorder: Secondary | ICD-10-CM | POA: Diagnosis not present

## 2019-08-30 DIAGNOSIS — H6123 Impacted cerumen, bilateral: Secondary | ICD-10-CM | POA: Diagnosis not present

## 2019-09-04 ENCOUNTER — Telehealth: Payer: Self-pay | Admitting: Neurology

## 2019-09-04 NOTE — Telephone Encounter (Signed)
Access[employer]urse message left on 9/4/2020at 5:24 PM  Reason for call: Ears ringing  Assessment by Nurse: Susy Manor, RN, Marjean Donna states she was started on prednisone on Tuesday for tinnitus. Woke up this morning and felt some relief as she has the last couple of days, but shortly after waking up she is back to the same as she was before starting the medication.

## 2019-09-04 NOTE — Telephone Encounter (Signed)
I have no recommendations for the patient.  Tinnitus has been a chronic issue and thought that the prednisone may help since it was an acute worsening of symptoms.  I recommend that she follow up with her PCP or ENT.

## 2019-09-04 NOTE — Telephone Encounter (Signed)
Called patient she was informed of provider response

## 2019-09-10 ENCOUNTER — Telehealth: Payer: Self-pay | Admitting: Internal Medicine

## 2019-09-10 NOTE — Telephone Encounter (Signed)
Pt states that she got a [name] from [facility] and she wants to know if Dr. Sharlet Salina knows if this medication causes dry eye. Name: Gabrielle [name]  Pt also states that she reached out to Dr. Georgie Chard office last week and was told to follow up with her PCP about ringing in her ears and wants to know what Dr. Sharlet Salina would recommend for that.  Pt did not want to schedule an appointment, just wants to know Dr. Charlynne Cousins answer.

## 2019-09-10 NOTE — Telephone Encounter (Signed)
I am not sure what she is asking about from Dr Tomi Likens needs clarification. I do not have knowledge about side effects from otc medications as they do not do clinical studies which provide that data. Most medications can cause some dry eye.

## 2019-09-11 NOTE — Telephone Encounter (Signed)
Called patient asked if we could call back tomorrow because she was on an urgent call. Told patient we will try her tomorrow

## 2019-09-19 DIAGNOSIS — H0288B Meibomian gland dysfunction left eye, upper and lower eyelids: Secondary | ICD-10-CM | POA: Diagnosis not present

## 2019-09-19 DIAGNOSIS — H04123 Dry eye syndrome of bilateral lacrimal glands: Secondary | ICD-10-CM | POA: Diagnosis not present

## 2019-09-19 DIAGNOSIS — H0288A Meibomian gland dysfunction right eye, upper and lower eyelids: Secondary | ICD-10-CM | POA: Diagnosis not present

## 2019-09-19 DIAGNOSIS — H469 Unspecified optic neuritis: Secondary | ICD-10-CM | POA: Diagnosis not present

## 2019-09-19 DIAGNOSIS — Z961 Presence of intraocular lens: Secondary | ICD-10-CM | POA: Diagnosis not present

## 2019-09-21 DIAGNOSIS — H04123 Dry eye syndrome of bilateral lacrimal glands: Secondary | ICD-10-CM | POA: Diagnosis not present

## 2019-09-21 DIAGNOSIS — H43811 Vitreous degeneration, right eye: Secondary | ICD-10-CM | POA: Diagnosis not present

## 2019-09-21 DIAGNOSIS — H40053 Ocular hypertension, bilateral: Secondary | ICD-10-CM | POA: Diagnosis not present

## 2019-09-21 DIAGNOSIS — H16223 Keratoconjunctivitis sicca, not specified as Sjogren's, bilateral: Secondary | ICD-10-CM | POA: Diagnosis not present

## 2019-09-21 DIAGNOSIS — Z961 Presence of intraocular lens: Secondary | ICD-10-CM | POA: Diagnosis not present

## 2019-09-24 ENCOUNTER — Telehealth: Payer: Self-pay | Admitting: Internal Medicine

## 2019-09-24 NOTE — Telephone Encounter (Signed)
Pt is asking for call back from Gabrielle [name], about flu shot and medication.  She says it is very important that she gets a call back as soon as possible.   Please call [phone]

## 2019-09-25 NOTE — Telephone Encounter (Signed)
Patient calling back. She states her phone rang once with no voicemail. Patient would like to know if it was [name].

## 2019-09-25 NOTE — Telephone Encounter (Signed)
FYI Called patient back she has an appointment Thursday and is tapering down on her xanax and is trying to taper on the Ambien because she found out that is causing the ringing in her brain. Does need a refill on her xanax so she can continue to taper off is okay with getting a smaller amount of pills than she was receiving. Wants to discuss with Dr. Sharlet Salina about all the new things going on and also wants her flu shot wants to get the regular flu shot not the high dose flu shot.

## 2019-09-26 NOTE — Telephone Encounter (Signed)
Has she seen psychiatry yet? We had given her about 3 months after last visit to see them.

## 2019-09-27 ENCOUNTER — Ambulatory Visit (INDEPENDENT_AMBULATORY_CARE_PROVIDER_SITE_OTHER): Payer: PPO | Admitting: Internal Medicine

## 2019-09-27 ENCOUNTER — Other Ambulatory Visit: Payer: Self-pay

## 2019-09-27 ENCOUNTER — Encounter: Payer: Self-pay | Admitting: Internal Medicine

## 2019-09-27 VITALS — BP 138/82 | HR 72 | Temp 97.7°F | Ht 64.0 in | Wt 214.0 lb

## 2019-09-27 DIAGNOSIS — H9313 Tinnitus, bilateral: Secondary | ICD-10-CM

## 2019-09-27 DIAGNOSIS — F5104 Psychophysiologic insomnia: Secondary | ICD-10-CM | POA: Diagnosis not present

## 2019-09-27 DIAGNOSIS — Z23 Encounter for immunization: Secondary | ICD-10-CM | POA: Diagnosis not present

## 2019-09-27 DIAGNOSIS — F4312 Post-traumatic stress disorder, chronic: Secondary | ICD-10-CM

## 2019-09-27 MED ORDER — ALPRAZOLAM 1 MG PO TABS: 1.0000 mg | ORAL_TABLET | Freq: Three times a day (TID) | ORAL | 2 refills | Status: DC

## 2019-09-27 NOTE — Assessment & Plan Note (Signed)
Have advised her to seek private psychiatrist as [facility] been [facility] more than 6 months and an upcoming appointment has been rescheduled back another month at least. She is currently taking remeron 3.75 mg dosing for only a week and this is helping anxiety but due to dry eyes she does not wish to continue. Did let her know that this could improve after 1-2 weeks of use. She will now start citalopram 10 mg morning, trazodone 25 mg evening for sleep and anxiety. I have asked her to get more information about her tapering doctor to find out what certifications they have as they seem to be prescribing her a lot of different medications. Refilled xanax and she wishes to get off this.

## 2019-09-27 NOTE — Assessment & Plan Note (Signed)
She will try trazodone 25 mg nightly to see if this helps. We did discuss that there is a physiological withdrawal from [place] which can cause an indefinite period of time loss of sleep even on medications which normally help and it will be unclear what is helping or hurting for several weeks potentially.

## 2019-09-27 NOTE — Assessment & Plan Note (Signed)
She has seen ENT and neurology and there appears to be no discrete etiology. She does have hearing loss. I did review available literature and there are no reports of tinnitus with shingrix however informed her that this is still relatively new and could be related. She did not want to get second dose which is her decision.

## 2019-09-27 NOTE — Patient Instructions (Signed)
I think it is okay to start the citalopram in the morning. Stop the mirtazepine in the evening.   Take 1/2 pill trazodone in the evening for sleep with the ambien (1/2 or 1) and the xanax as you are doing now.

## 2019-09-27 NOTE — Progress Notes (Signed)
Subjective:   Patient ID: Gabrielle [name], female    DOB: 02/23/1951, 68 y.o.   MRN: [id number]  HPI The patient is a 68 YO female coming in for concerns about her chronic PTSD (worsened by eye problems and struggling more lately, she is currently trying to taper down/off ambien and xanax, seeing a "tapering doctor" and is unsure of her qualifications, she cannot prescribe benzos but has been giving her many different medications to try, she is currently taking mirtazepine for about 1 week and feels that it is helping the anxiety but worsening her dry eyes, she was given citalopram to replace and start taking but she has not tried yet, she also has old bottles of medications which she did not start or started and only took for a couple of days before stopping and not sure why she stopped, has an old bottle of trazodone which she never tried, currently taking 1/2 remeron, xanax and 1/2 ambien at night and this was working but last night she only slept 2-3 hours) and insomnia (currently taking remeron, xanax, 1/2 ambien, previously taking xanax and ambien for night time and then another xanax if she awoke in the night with tightness in her chest) and tinnitus (worse after taking shingles vaccine, she wants to know if this is related, she is not sure about taking the second dose, is having pulsatile tinnitus, went to neurology and they tried prednisone which did not help and sent her back to ENT, she has seen them in the past and they have never been helpful, has hearing loss and hearing aids, this is a constant problem and interferes with her life).   Review of Systems  Constitutional: Positive for activity change and appetite change.  HENT: Positive for hearing loss and tinnitus.   Eyes: Negative.   Respiratory: Negative for cough, chest tightness and shortness of breath.   Cardiovascular: Negative for chest pain, palpitations and leg swelling.  Gastrointestinal: Negative for abdominal distention,  abdominal pain, constipation, diarrhea, nausea and vomiting.  Musculoskeletal: Negative.   Skin: Negative.   Neurological: Negative.   Psychiatric/Behavioral: Positive for decreased concentration, dysphoric mood and sleep disturbance. The patient is nervous/anxious.     Objective:  Physical Exam Constitutional:      Appearance: She is well-developed. She is obese.  HENT:     Head: Normocephalic and atraumatic.     Comments: Hearing aids in place Neck:     Musculoskeletal: Normal range of motion.  Cardiovascular:     Rate and Rhythm: Normal rate and regular rhythm.  Pulmonary:     Effort: Pulmonary effort is normal. No respiratory distress.     Breath sounds: Normal breath sounds. No wheezing or rales.  Abdominal:     General: Bowel sounds are normal. There is no distension.     Palpations: Abdomen is soft.     Tenderness: There is no abdominal tenderness. There is no rebound.  Skin:    General: Skin is warm and dry.  Neurological:     Mental Status: She is alert and oriented to person, place, and time.     Coordination: Coordination normal.     Vitals:   09/27/19 1023  BP: 138/82  Pulse: 72  Temp: 97.7 F (36.5 C)  TempSrc: Oral  SpO2: 97%  Weight: 214 lb (97.1 kg)  Height: 5\' 4"  (1.626 m)   Visit time 40 minutes: greater than 50% of that time was spent in face to face counseling and coordination of care  with the patient: counseled about as above as well as medical decisions made be her "tapering doctor" as well as trying to understand the credentials there given that they are giving her advice about medications but not her controlled medications and she does not have an ongoing medical relationship with them  Assessment & Plan:  Flu shot given at visit

## 2019-09-27 NOTE — Telephone Encounter (Signed)
Patient has appointment today to discuss

## 2019-09-28 ENCOUNTER — Telehealth: Payer: Self-pay

## 2019-09-28 NOTE — Telephone Encounter (Signed)
Copied from [place] [id number] [id number]. Topic: General - Other >> Sep 28, 2019  9:29 AM Rainey Pines A wrote: Patient would like a callback from [facility] in regards to her medication and vitamins

## 2019-09-28 NOTE — Telephone Encounter (Signed)
Called patient states that the antidepressants that the [place] put her on is drying her eyes out really bad. [facility] and no [facility] has [facility] her [facility]. States that she is going to stop that mirtazapine Patient states that she has been on mirtazapine for a week and wants to know how long it will take to get out of her system?  Did want to let us know started omega 3 and some new all natural eye drops similasan wants to know what your take on the omega 3 and is it okay to take with her medicines and vitamins she is currently on.

## 2019-10-01 NOTE — Telephone Encounter (Signed)
What is your take on multivitamins and dry eye. States she read that they have side effects of dry eye. Do you know of any dry eye specialist that are around here that she can go to states that she is having a hard time finding one.

## 2019-10-01 NOTE — Telephone Encounter (Signed)
Omega 3 is fine to take. Could take a couple of weeks before mirtazepine out of system.

## 2019-10-01 NOTE — Telephone Encounter (Signed)
She would be better off to ask her eye doctor about dry eye specialists as they know other eye specialists better than I do. She does not ned to take a multivitamin so can stop.

## 2019-10-02 ENCOUNTER — Telehealth: Payer: Self-pay

## 2019-10-02 NOTE — Telephone Encounter (Signed)
Patient informed of MD response and did find a dry eye specialist and is going to think about going to him is really having a hard time with people in her eyes

## 2019-10-02 NOTE — Telephone Encounter (Signed)
Copied from [place] [phone]. Topic: General - Other >> Oct 02, 2019  3:29 PM Pauline Good wrote: Reason for CRM: pt stated she spoke to someone in the office told her the Shingles vaccine copay was $200 a month ago. Pt stated she just spoke to [facility] and they told her the copay is $45.00 for the Shingles Vaccine. Please call pt to advise

## 2019-10-02 NOTE — Telephone Encounter (Signed)
Do you know anything about this? Can you call and talk to patient about the copays and how they work

## 2019-10-03 NOTE — Telephone Encounter (Signed)
lvm asking patient to call back to discuss----ok to be transferred to [place] office and talk with Brittain Smithey,RN

## 2019-10-08 ENCOUNTER — Ambulatory Visit (INDEPENDENT_AMBULATORY_CARE_PROVIDER_SITE_OTHER): Payer: PPO | Admitting: Internal Medicine

## 2019-10-08 ENCOUNTER — Encounter: Payer: Self-pay | Admitting: Internal Medicine

## 2019-10-08 DIAGNOSIS — Z20822 Contact with and (suspected) exposure to covid-19: Secondary | ICD-10-CM | POA: Insufficient documentation

## 2019-10-08 DIAGNOSIS — Z20828 Contact with and (suspected) exposure to other viral communicable diseases: Secondary | ICD-10-CM

## 2019-10-08 MED ORDER — BENZONATATE 200 MG PO CAPS: 200.0000 mg | ORAL_CAPSULE | Freq: Three times a day (TID) | ORAL | 0 refills | Status: DC | PRN

## 2019-10-08 NOTE — Progress Notes (Signed)
Virtual Visit via Video Note  I connected with Gabrielle [name] on 10/08/19 at  3:20 PM EDT by a video enabled telemedicine application and verified that I am speaking with the correct person using two identifiers.  The patient and the provider were at separate locations throughout the entire encounter.   I discussed the limitations of evaluation and management by telemedicine and the availability of in person appointments. The patient expressed understanding and agreed to proceed. The patient and the provider were the only parties present for the visit unless noted in HPI below.  History of Present Illness: The patient is a 68 y.o. female with visit for cough and congestion for the last 3 days. Recent visit with daughter and grandchild and the grandchild had a little cold. Denies fevers or chills. Denies stomach problems such as nausea or vomiting or diarrhea. Has non-productive cough. Denies allergy or sinus drainage. Denies headaches. Overall it is improving mildly today. Has tried otc cough medications which did not help  Observations/Objective: Appearance: normal, breathing appears normal, no coughing during visit, casual grooming, abdomen does not appear distended, throat normal, mental status is A and O times 3  Assessment and Plan: See problem oriented charting  Follow Up Instructions: covid-19 ordered, rx tessalon perles  I discussed the assessment and treatment plan with the patient. The patient was provided an opportunity to ask questions and all were answered. The patient agreed with the plan and demonstrated an understanding of the instructions.   The patient was advised to call back or seek an in-person evaluation if the symptoms worsen or if the condition fails to improve as anticipated.  [name], MD

## 2019-10-08 NOTE — Assessment & Plan Note (Signed)
Ordered covid-19 test today. Rx tessalon perles. Counseled about need to isolate until results return.

## 2019-10-09 ENCOUNTER — Telehealth: Payer: Self-pay | Admitting: Internal Medicine

## 2019-10-09 NOTE — Progress Notes (Signed)
Subjective:   Gabrielle [name] is a 68 y.o. female who presents for Medicare Annual (Subsequent) preventive examination. I connected with patient by a telephone and verified that I am speaking with the correct person using two identifiers. Patient stated full name and DOB. Patient gave permission to continue with telephonic visit. Patient's location was at home and Nurse's location was at [place] office.   Review of Systems:   Cardiac Risk Factors include: advanced age (>27men, >6 women) Sleep patterns: , gets up several times nightly  and sleep hours varies  nightly. Patient reports insomnia issues, discussed recommended sleep tips and stress reduction tips, education was attached to patient's AVS.  Home Safety/Smoke Alarms: Feels safe in home. Smoke alarms in place.  Living environment; residence and Firearm Safety: apartment. Lives alone, no needs for DME, good support system Seat Belt Safety/Bike Helmet: Wears seat belt.      Objective:     Vitals: There were no vitals taken for this visit.  There is no height or weight on file to calculate BMI.  Advanced Directives 10/10/2019 08/27/2019 11/07/2018 10/06/2018 09/18/2018 09/09/2018 08/12/2018  Does Patient Have a Medical Advance Directive? No Yes Yes No No No No  Type of Advance Directive - Healthcare Power of [place]  Does patient want to make changes to medical advance directive? - - No - Patient declined - - - -  Copy of [place] in Chart? - - No - copy requested - - - -  Would patient like information on creating a medical advance directive? Yes (ED - Information included in AVS) - - No - Patient declined No - Patient declined No - Patient declined -  Pre-existing out of facility DNR order (yellow form or pink MOST form) - - - - - - -    Tobacco Social History   Tobacco Use  Smoking Status Former Smoker  . Packs/day: 1.00  . Years: 22.00  . Pack years: 22.00  .  Types: Cigarettes  . Start date: 03/08/1972  . Quit date: 01/08/1994  . Years since quitting: 25.7  Smokeless Tobacco Never Used  Tobacco Comment   quit smoking 69yrs ago     Counseling given: Not Answered Comment: quit smoking 24yrs ago  Past Medical History:  Diagnosis Date  . Anemia    as a child  . Anxiety    takes Xanax daily  . Arthritis   . Cataracts, bilateral    immature  . Chronic insomnia 10/11/2014  . Complication of anesthesia   . Constipation    will occasionally take Milk of Mag  . Diverticulosis   . Dizziness    rarely  . GERD (gastroesophageal reflux disease)    takes Omeprazole every other day  . Glaucoma    borderline and no drops required  . Hearing loss    but doesn't have hearing aids  . History of bronchitis    many many yrs ago  . History of colitis   . History of colon polyps   . Hypertension    hasn't been on meds for the past 59yrs   . Insomnia    takes Trazodone nightly as needed and Ambien nightly   . 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 Joint pain   . Joint swelling   . PONV (postoperative nausea and vomiting)   . Tinnitus    takes HCTZ daily to decrease pressure in ears   Past Surgical  History:  Procedure Laterality Date  . ABDOMINAL HYSTERECTOMY    . APPENDECTOMY    . COLONOSCOPY    . IR RADIOLOGIST EVAL & MGMT  06/08/2017  . tinnitus Right   . TONSILLECTOMY    . TOTAL KNEE ARTHROPLASTY Right 03/13/2014   DR MURPHY  . TOTAL KNEE ARTHROPLASTY Right 03/13/2014   Procedure: TOTAL KNEE ARTHROPLASTY;  Surgeon: [name], MD;  Location: [place];  Service: Orthopedics;  Laterality: Right;  . TUBAL LIGATION     Family History  Problem Relation Age of Onset  . Cancer - Other Father   . Kidney disease Father   . Cancer - Other Sister        breast ca  . Cancer - Other Brother        lymphoma in remission  . Cancer - Other Sister        in remission breast ca   Social History   Socioeconomic History  . Marital status:  Divorced    Spouse name: Not on file  . Number of children: 1  . Years of education: Not on file  . Highest education level: Not on file  Occupational History  . Occupation: retired  Scientific laboratory technician  . Financial resource strain: Not hard at all  . Food insecurity    Worry: Never true    Inability: Never true  . Transportation needs    Medical: No    Non-medical: No  Tobacco Use  . Smoking status: Former Smoker    Packs/day: 1.00    Years: 22.00    Pack years: 22.00    Types: Cigarettes    Start date: 03/08/1972    Quit date: 01/08/1994    Years since quitting: 25.7  . Smokeless tobacco: Never Used  . Tobacco comment: quit smoking 18yrs ago  Substance and Sexual Activity  . Alcohol use: No    Alcohol/week: 0.0 standard drinks  . Drug use: No  . Sexual activity: Not Currently    Birth control/protection: Surgical  Lifestyle  . Physical activity    Days per week: 3 days    Minutes per session: 40 min  . Stress: Only a little  Relationships  . Social connections    Talks on phone: More than three times a week    Gets together: More than three times a week    Attends religious service: 1 to 4 times per year    Active member of club or organization: Yes    Attends meetings of clubs or organizations: 1 to 4 times per year    Relationship status: Divorced  Other Topics Concern  . Not on file  Social History Narrative   Right handed, Caffeine none, Divorced, 2 kids,  PT - Housing auth in [place],   13.5 yrs school; one level home    Outpatient Encounter Medications as of 10/10/2019  Medication Sig  . acetaminophen (TYLENOL) 500 MG tablet Take 500 mg by mouth every 6 (six) hours as needed.  . ALPRAZolam (XANAX) 1 MG tablet Take 1 tablet (1 mg total) by mouth 3 (three) times daily. (Patient taking differently: Take 1 mg by mouth 3 (three) times daily. )  . benzonatate (TESSALON) 200 MG capsule Take 1 capsule (200 mg total) by mouth 3 (three) times daily as needed for cough.  .  carboxymethylcellulose (REFRESH PLUS) 0.5 % SOLN 1 drop 3 (three) times daily as needed. Uses kind with no preservatives Only can see in R eye  . cholecalciferol (  VITAMIN D) 1000 UNITS tablet Take 2,000 Units by mouth daily. Reported on 06/01/2016  . Multiple Vitamins-Minerals (MULTIVITAMIN WITH MINERALS) tablet Take 1 tablet by mouth daily.  Marland Kitchen omeprazole (PRILOSEC) 40 MG capsule Take 1 capsule (40 mg total) by mouth daily.  . polyethylene glycol (MIRALAX) packet Take 17 g by mouth daily as needed.   . RESTASIS 0.05 % ophthalmic emulsion   . sucralfate (CARAFATE) 1 GM/10ML suspension Take 10 mLs (1 g total) by mouth 4 (four) times daily -  with meals and at bedtime.  Marland Kitchen zolpidem (AMBIEN) 10 MG tablet Take 1-1.5 tablets (10-15 mg total) by mouth at bedtime as needed for sleep. May take 1 and 1/2 tab = 15 mg  . [DISCONTINUED] brimonidine-timolol (COMBIGAN) 0.2-0.5 % ophthalmic solution Apply to eye.  . [DISCONTINUED] dorzolamide (TRUSOPT) 2 % ophthalmic solution Apply to eye.  . [DISCONTINUED] famotidine (PEPCID) 20 MG tablet Take 1 tablet (20 mg total) by mouth 2 (two) times daily. (Patient not taking: Reported on 10/10/2019)  . [DISCONTINUED] ibuprofen (ADVIL,MOTRIN) 600 MG tablet Take 1 tablet (600 mg total) by mouth every 6 (six) hours as needed. (Patient not taking: Reported on 10/10/2019)  . [DISCONTINUED] meclizine (ANTIVERT) 12.5 MG tablet Take 1 tablet (12.5 mg total) by mouth 3 (three) times daily as needed for dizziness. (Patient not taking: Reported on 10/10/2019)  . [DISCONTINUED] metroNIDAZOLE (METROGEL) 0.75 % vaginal gel Place 1 Applicatorful vaginally 2 (two) times daily. (Patient not taking: Reported on 10/10/2019)  . [DISCONTINUED] mirtazapine (REMERON) 7.5 MG tablet Take 7.5 mg by mouth at bedtime. Take 1/2 tab for one week then full tablet Just got script hasn't started yet wanted to ask Dr Tomi Likens at appt today first -   No facility-administered encounter medications on file as of  10/10/2019.     Activities of Daily Living In your present state of health, do you have any difficulty performing the following activities: 10/10/2019 11/07/2018  Hearing? N N  Vision? N Y  Difficulty concentrating or making decisions? N N  Walking or climbing stairs? N N  Dressing or bathing? N N  Doing errands, shopping? N Y  Conservation officer, nature and eating ? N N  Using the Toilet? N N  In the past six months, have you accidently leaked urine? N N  Do you have problems with loss of bowel control? N N  Managing your Medications? N N  Managing your Finances? N N  Housekeeping or managing your Housekeeping? N N  Some recent data might be hidden    Patient Care Team: [name], MD as PCP - General (Internal Medicine) Pieter Partridge, DO as Consulting Physician (Neurology) Pieter Partridge, DO as Consulting Physician (Neurology) Warden Fillers, MD as Consulting Physician (Ophthalmology) Barry Brunner III, MD as Referring Physician (Ophthalmology)    Assessment:   This is a routine wellness examination for Kariyah. Physical assessment deferred to PCP.  Exercise Activities and Dietary recommendations Current Exercise Habits: Home exercise routine, Type of exercise: walking;calisthenics, Time (Minutes): 40, Frequency (Times/Week): 3, Weekly Exercise (Minutes/Week): 120, Intensity: Mild, Exercise limited by: None identified  Diet (meal preparation, eat out, water intake, caffeinated beverages, dairy products, fruits and vegetables): in general, a "healthy" diet  , well balanced   Reviewed heart healthy diet. Encouraged patient to increase daily water and healthy fluid intake.  Goals   None     Fall Risk Fall Risk  10/10/2019 08/27/2019 11/07/2018 10/06/2018 09/18/2018  Falls in the past year? 0 1 0  Yes Yes  Number falls in past yr: 0 1 0 1 1  Injury with Fall? 0 0 0 No No  Risk for fall due to : - - Impaired vision Impaired vision;Impaired balance/gait -  Follow up -  - Education provided;Falls prevention discussed Falls prevention discussed Education provided;Falls prevention discussed   Depression Screen PHQ 2/9 Scores 10/10/2019 11/07/2018 10/06/2018 09/18/2018  PHQ - 2 Score 3 6 6 6   PHQ- 9 Score 8 14 16 20   Exception Documentation - - - -     Cognitive Function       Ad8 score reviewed for issues:  Issues making decisions: no  Less interest in hobbies / activities: no  Repeats questions, stories (family complaining): no  Trouble using ordinary gadgets (microwave, computer, phone):no  Forgets the month or year: no  Mismanaging finances: no  Remembering appts: no  Daily problems with thinking and/or memory: no Ad8 score is= 0  Immunization History  Administered Date(s) Administered  . Influenza, High Dose Seasonal PF 11/28/2018  . Influenza,inj,Quad PF,6+ Mos 09/27/2019  . Influenza-Unspecified 09/18/2014, 09/26/2016, 09/18/2017   Screening Tests Health Maintenance  Topic Date Due  . Hepatitis C Screening  01-26-1951  . TETANUS/TDAP  07/03/1970  . PNA vac Low Risk Adult (1 of 2 - PCV13) 07/03/2016  . MAMMOGRAM  05/13/2021  . COLONOSCOPY  10/30/2028  . INFLUENZA VACCINE  Completed  . DEXA SCAN  Completed      Plan:      Reviewed health maintenance screenings with patient today and relevant education, vaccines, and/or referrals were provided.   I have personally reviewed and noted the following in the patient's chart:   . Medical and social history . Use of alcohol, tobacco or illicit drugs  . Current medications and supplements . Functional ability and status . Nutritional status . Physical activity . Advanced directives . List of other physicians . Screenings to include cognitive, depression, and falls . Referrals and appointments  In addition, I have reviewed and discussed with patient certain preventive protocols, quality metrics, and best practice recommendations. A written personalized care plan for  preventive services as well as general preventive health recommendations were provided to patient.     Michiel Cowboy, RN  10/10/2019

## 2019-10-09 NOTE — Telephone Encounter (Signed)
Patient states that she is having coughing real bad at night time that keeps her up and is having trouble coughing the phlegm up. Patient wanting to know if she could get something to help break up the phlegm, states the pearls help suppress the cough but at night it is not doing as well as during the day. If she can get something not wanting and antihistamine because of her dry eye. States in the past has gotten tussinex was not sure if that was something that could help.

## 2019-10-09 NOTE — Telephone Encounter (Signed)
°  Relation to pt: self  Call back number: [phone] Pharmacy: [facility], [place], [place] [address]  Reason for call:  Patient had a virtual visit with PCP 10/12/20202 patient states she informed PCP she had medication at home but didn't realize it was 68 years old. Patient would like a medication "no antihistamine that will not  Dry her eyes out)"  "loses up the phlegm and suppresses the cough" patient would like to speak with PCP nurse prior to Rx being sent, please advise

## 2019-10-10 ENCOUNTER — Ambulatory Visit (INDEPENDENT_AMBULATORY_CARE_PROVIDER_SITE_OTHER): Payer: PPO | Admitting: *Deleted

## 2019-10-10 DIAGNOSIS — Z0000 Encounter for general adult medical examination without abnormal findings: Secondary | ICD-10-CM

## 2019-10-10 NOTE — Telephone Encounter (Signed)
Can try mucinex over the counter to help break up the sputum. I had already sent in the cough medicine during visit.

## 2019-10-10 NOTE — Telephone Encounter (Signed)
Pt informed of below.  

## 2019-10-10 NOTE — Telephone Encounter (Signed)
Patient called and would like a call back form pcp or cma about her cough and medication call in.

## 2019-10-11 ENCOUNTER — Other Ambulatory Visit: Payer: Self-pay

## 2019-10-11 DIAGNOSIS — Z20822 Contact with and (suspected) exposure to covid-19: Secondary | ICD-10-CM

## 2019-10-11 DIAGNOSIS — Z20828 Contact with and (suspected) exposure to other viral communicable diseases: Secondary | ICD-10-CM | POA: Diagnosis not present

## 2019-10-11 NOTE — Telephone Encounter (Signed)
Pt is calling and would like dr crawford or nurse to call her back concerning her medication

## 2019-10-11 NOTE — Progress Notes (Signed)
Medical screening examination/treatment/procedure(s) were performed by non-physician practitioner and as supervising physician I was immediately available for consultation/collaboration. I agree with above. Keaira Whitehurst A Dalessandro Baldyga, MD 

## 2019-10-11 NOTE — Telephone Encounter (Signed)
Called patient back states that she is still not able to sleep with the mucinex and the pearls. Told patient it takes time for it to work but she did not know if there was anything else that she could try or to be sent in that is stronger than Robitussin or mucinex so she can get some relief so she can sleep.

## 2019-10-12 DIAGNOSIS — Z9889 Other specified postprocedural states: Secondary | ICD-10-CM | POA: Diagnosis not present

## 2019-10-12 DIAGNOSIS — H47012 Ischemic optic neuropathy, left eye: Secondary | ICD-10-CM | POA: Diagnosis not present

## 2019-10-12 DIAGNOSIS — H16223 Keratoconjunctivitis sicca, not specified as Sjogren's, bilateral: Secondary | ICD-10-CM | POA: Diagnosis not present

## 2019-10-12 DIAGNOSIS — H04123 Dry eye syndrome of bilateral lacrimal glands: Secondary | ICD-10-CM | POA: Diagnosis not present

## 2019-10-12 NOTE — Telephone Encounter (Signed)
As far as medications which are safe and do not have side effects of dry eye these are our best options.

## 2019-10-12 NOTE — Telephone Encounter (Signed)
Patient informed MD response and stated understanding

## 2019-10-13 ENCOUNTER — Emergency Department (HOSPITAL_COMMUNITY)
Admission: EM | Admit: 2019-10-13 | Discharge: 2019-10-13 | Disposition: A | Discharge status: Home / Self Care | Payer: No Typology Code available for payment source | Attending: Emergency Medicine | Admitting: Emergency Medicine

## 2019-10-13 ENCOUNTER — Encounter (HOSPITAL_COMMUNITY): Payer: Self-pay | Admitting: Emergency Medicine

## 2019-10-13 ENCOUNTER — Emergency Department (HOSPITAL_COMMUNITY): Payer: No Typology Code available for payment source

## 2019-10-13 ENCOUNTER — Other Ambulatory Visit: Payer: Self-pay

## 2019-10-13 DIAGNOSIS — R079 Chest pain, unspecified: Secondary | ICD-10-CM | POA: Diagnosis not present

## 2019-10-13 DIAGNOSIS — R05 Cough: Secondary | ICD-10-CM | POA: Diagnosis not present

## 2019-10-13 DIAGNOSIS — Z79899 Other long term (current) drug therapy: Secondary | ICD-10-CM | POA: Diagnosis not present

## 2019-10-13 DIAGNOSIS — I10 Essential (primary) hypertension: Secondary | ICD-10-CM | POA: Diagnosis not present

## 2019-10-13 DIAGNOSIS — R059 Cough, unspecified: Secondary | ICD-10-CM

## 2019-10-13 DIAGNOSIS — Z87891 Personal history of nicotine dependence: Secondary | ICD-10-CM | POA: Insufficient documentation

## 2019-10-13 DIAGNOSIS — R0602 Shortness of breath: Secondary | ICD-10-CM | POA: Diagnosis not present

## 2019-10-13 LAB — NOVEL CORONAVIRUS, NAA: SARS-CoV-2, NAA: NOT DETECTED

## 2019-10-13 MED ORDER — HYDROCODONE-HOMATROPINE 5-1.5 MG/5ML PO SYRP: 5.0000 mL | ORAL_SOLUTION | Freq: Four times a day (QID) | ORAL | 0 refills | Status: DC | PRN

## 2019-10-13 MED ORDER — DM-GUAIFENESIN ER 30-600 MG PO TB12
1.0000 | ORAL_TABLET | Freq: Two times a day (BID) | ORAL | Status: DC
  Filled 2019-10-13: qty 1

## 2019-10-13 MED ORDER — ALBUTEROL SULFATE HFA 108 (90 BASE) MCG/ACT IN AERS
2.0000 | INHALATION_SPRAY | Freq: Once | RESPIRATORY_TRACT | Status: AC
  Administered 2019-10-13: 2 via RESPIRATORY_TRACT
  Filled 2019-10-13: qty 6.7

## 2019-10-13 MED ORDER — AMOXICILLIN-POT CLAVULANATE 875-125 MG PO TABS: 1.0000 | ORAL_TABLET | Freq: Two times a day (BID) | ORAL | 0 refills | Status: DC

## 2019-10-13 MED ORDER — HYDROCOD POLST-CPM POLST ER 10-8 MG/5ML PO SUER: 5.0000 mL | Freq: Two times a day (BID) | ORAL | 0 refills | Status: DC | PRN

## 2019-10-13 MED ORDER — HYDROCODONE-HOMATROPINE 5-1.5 MG/5ML PO SYRP
5.0000 mL | ORAL_SOLUTION | Freq: Once | ORAL | Status: AC
  Administered 2019-10-13: 5 mL via ORAL
  Filled 2019-10-13: qty 5

## 2019-10-13 NOTE — ED Provider Notes (Signed)
[facility] DEPT Provider Note   CSN: [id number] Arrival date & time: 10/13/19  0411     History   Chief Complaint Chief Complaint  Patient presents with  . Cough    HPI Gabrielle [name] is a 68 y.o. female.     The history is provided by the patient.  Cough Cough characteristics:  Non-productive Severity:  Moderate Duration:  14 hours Timing:  Constant Progression:  Unchanged Chronicity:  New Smoker: no   Relieved by:  Nothing Associated symptoms: chest pain (when coughing), shortness of breath (when coughing), sinus congestion and sore throat   Associated symptoms: no chills, no fever, no headaches and no rash     Past Medical History:  Diagnosis Date  . Anemia    as a child  . Anxiety    takes Xanax daily  . Arthritis   . Cataracts, bilateral    immature  . Chronic insomnia 10/11/2014  . Complication of anesthesia   . Constipation    will occasionally take Milk of Mag  . Diverticulosis   . Dizziness    rarely  . GERD (gastroesophageal reflux disease)    takes Omeprazole every other day  . Glaucoma    borderline and no drops required  . Hearing loss    but doesn't have hearing aids  . History of bronchitis    many many yrs ago  . History of colitis   . History of colon polyps   . Hypertension    hasn't been on meds for the past 58yrs   . Insomnia    takes Trazodone nightly as needed and Ambien nightly   . 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 Joint pain   . Joint swelling   . PONV (postoperative nausea and vomiting)   . Tinnitus    takes HCTZ daily to decrease pressure in ears    Patient Active Problem List   Diagnosis Date Noted  . Suspected COVID-19 virus infection 10/08/2019  . Injection site reaction 08/15/2019  . Malodorous urine 12/15/2018  . Elevated blood pressure reading in office without diagnosis of hypertension 12/15/2018  . Blindness 09/14/2018  . GERD (gastroesophageal reflux disease)  04/21/2018  . Chronic post-traumatic stress disorder (PTSD) 03/14/2018  . Tinnitus 03/14/2018  . Routine general medical examination at a health care facility 03/14/2018  . Abnormality of gait 06/03/2015  . S/P TKR (total knee replacement) 06/03/2015  . Chronic left SI joint pain 06/03/2015  . Chronic insomnia 10/11/2014  . DJD (degenerative joint disease) of knee 03/13/2014    Past Surgical History:  Procedure Laterality Date  . ABDOMINAL HYSTERECTOMY    . APPENDECTOMY    . COLONOSCOPY    . IR RADIOLOGIST EVAL & MGMT  06/08/2017  . tinnitus Right   . TONSILLECTOMY    . TOTAL KNEE ARTHROPLASTY Right 03/13/2014   DR MURPHY  . TOTAL KNEE ARTHROPLASTY Right 03/13/2014   Procedure: TOTAL KNEE ARTHROPLASTY;  Surgeon: [name], MD;  Location: [facility];  Service: Orthopedics;  Laterality: Right;  . TUBAL LIGATION       OB History   No obstetric history on file.      Home Medications    Prior to Admission medications   Medication Sig Start Date End Date Taking? Authorizing Provider  acetaminophen (TYLENOL) 500 MG tablet Take 500 mg by mouth every 6 (six) hours as needed.    [provider]  ALPRAZolam Duanne Moron) 1 MG tablet Take 1 tablet (1  mg total) by mouth 3 (three) times daily. Patient taking differently: Take 1 mg by mouth 3 (three) times daily.  09/27/19   [name], MD  amoxicillin-clavulanate (AUGMENTIN) 875-125 MG tablet Take 1 tablet by mouth 2 (two) times daily. One po bid x 7 days 10/13/19   Jeidy Hoerner, Corene Cornea, MD  benzonatate (TESSALON) 200 MG capsule Take 1 capsule (200 mg total) by mouth 3 (three) times daily as needed for cough. 10/08/19   [name], MD  carboxymethylcellulose (REFRESH PLUS) 0.5 % SOLN 1 drop 3 (three) times daily as needed. Uses kind with no preservatives Only can see in R eye    [provider]  chlorpheniramine-HYDROcodone (TUSSIONEX PENNKINETIC ER) 10-8 MG/5ML SUER Take 5 mLs by mouth every 12 (twelve) hours as  needed for cough. 10/13/19   Maryrose Colvin, Corene Cornea, MD  cholecalciferol (VITAMIN D) 1000 UNITS tablet Take 2,000 Units by mouth daily. Reported on 06/01/2016    [provider]  HYDROcodone-homatropine (HYCODAN) 5-1.5 MG/5ML syrup Take 5 mLs by mouth every 6 (six) hours as needed for cough. 10/13/19   Kaylene Dawn, Corene Cornea, MD  Multiple Vitamins-Minerals (MULTIVITAMIN WITH MINERALS) tablet Take 1 tablet by mouth daily.    [provider]  omeprazole (PRILOSEC) 40 MG capsule Take 1 capsule (40 mg total) by mouth daily. 04/10/19   [name], MD  polyethylene glycol [facility]) packet Take 17 g by mouth daily as needed.     [provider]  RESTASIS 0.05 % ophthalmic emulsion  10/12/18   [provider]  sucralfate (CARAFATE) 1 GM/10ML suspension Take 10 mLs (1 g total) by mouth 4 (four) times daily -  with meals and at bedtime. 10/13/18   [name], MD  zolpidem (AMBIEN) 10 MG tablet Take 1-1.5 tablets (10-15 mg total) by mouth at bedtime as needed for sleep. May take 1 and 1/2 tab = 15 mg 06/22/19   [name], MD    Family History Family History  Problem Relation Age of Onset  . Cancer - Other Father   . Kidney disease Father   . Cancer - Other Sister        breast ca  . Cancer - Other Brother        lymphoma in remission  . Cancer - Other Sister        in remission breast ca    Social History Social History   Tobacco Use  . Smoking status: Former Smoker    Packs/day: 1.00    Years: 22.00    Pack years: 22.00    Types: Cigarettes    Start date: 03/08/1972    Quit date: 01/08/1994    Years since quitting: 25.7  . Smokeless tobacco: Never Used  . Tobacco comment: quit smoking 27yrs ago  Substance Use Topics  . Alcohol use: No    Alcohol/week: 0.0 standard drinks  . Drug use: No     Allergies   Shellfish allergy, Contrast media [iodinated diagnostic agents], Hydrocodone, Metronidazole, Acetazolamide, and Gabapentin   Review  of Systems Review of Systems  Constitutional: Negative for chills and fever.  HENT: Positive for sore throat.   Respiratory: Positive for cough and shortness of breath (when coughing).   Cardiovascular: Positive for chest pain (when coughing).  Skin: Negative for rash.  Neurological: Negative for headaches.  All other systems reviewed and are negative.    Physical Exam Updated Vital Signs BP (!) 153/70 (BP Location: Left Arm)   Pulse (!) 57  Temp 98.3 F (36.8 C) (Oral)   Resp 18   Ht 5\' 4"  (1.626 m)   Wt 96.2 kg   SpO2 97%   BMI 36.39 kg/m   Physical Exam Vitals signs and nursing note reviewed.  Constitutional:      Appearance: She is well-developed.  HENT:     Head: Normocephalic and atraumatic.     Mouth/Throat:     Mouth: Mucous membranes are dry.     Pharynx: Oropharynx is clear.  Eyes:     Conjunctiva/sclera: Conjunctivae normal.  Neck:     Musculoskeletal: Normal range of motion.  Cardiovascular:     Rate and Rhythm: Normal rate and regular rhythm.  Pulmonary:     Effort: Pulmonary effort is normal. No respiratory distress.     Breath sounds: No stridor.  Abdominal:     General: There is no distension.  Musculoskeletal: Normal range of motion.        General: No swelling or tenderness.  Skin:    General: Skin is warm and dry.  Neurological:     Mental Status: She is alert.      ED Treatments / Results  Labs (all labs ordered are listed, but only abnormal results are displayed) Labs Reviewed - No data to display  EKG None  Radiology Dg Chest Portable 1 View  Result Date: 10/13/2019 CLINICAL DATA:  Flu shot 2 weeks ago with persistent cough. EXAM: PORTABLE CHEST 1 VIEW COMPARISON:  04/24/2018; 06/27/2017 FINDINGS: Grossly unchanged cardiac silhouette and mediastinal contours. Atherosclerotic plaque within the thoracic aorta. Bilateral medial basilar heterogeneous opacities are unchanged and favored to represent atelectasis and/or a prominent  epicardial fat pad. No new focal airspace opacities. No pleural effusion or pneumothorax. No evidence of edema. No acute osseous abnormalities. IMPRESSION: No acute cardiopulmonary disease. Specifically, no evidence of pneumonia. Electronically Signed   By: Sandi Mariscal M.D.   On: 10/13/2019 05:48    Procedures Procedures (including critical care time)  Medications Ordered in ED Medications  HYDROcodone-homatropine (HYCODAN) 5-1.5 MG/5ML syrup 5 mL (5 mLs Oral Given 10/13/19 0548)  albuterol (VENTOLIN HFA) 108 (90 Base) MCG/ACT inhaler 2 puff (2 puffs Inhalation Given 10/13/19 0546)     Initial Impression / Assessment and Plan / ED Course  I have reviewed the triage vital signs and the nursing notes.  Pertinent labs & imaging results that were available during my care of the patient were reviewed by me and considered in my medical decision making (see chart for details).        Upper airway congestion/infection? Symptomatic treatmetn and antibiotics due to duration of symptoms. Two cough meds sent to pharmacy, only fill cheapest.   Final Clinical Impressions(s) / ED Diagnoses   Final diagnoses:  Cough    ED Discharge Orders         Ordered    chlorpheniramine-HYDROcodone (TUSSIONEX PENNKINETIC ER) 10-8 MG/5ML SUER  Every 12 hours PRN     10/13/19 0705    amoxicillin-clavulanate (AUGMENTIN) 875-125 MG tablet  2 times daily     10/13/19 0705    HYDROcodone-homatropine (HYCODAN) 5-1.5 MG/5ML syrup  Every 6 hours PRN     10/13/19 0722           Jolonda Gomm, Corene Cornea, MD 10/14/19 [phone]

## 2019-10-13 NOTE — Discharge Instructions (Signed)
http://mcguire.com/  According to a new study, published today in [place], a noninvasive device that applies a technique known as bimodal neuromodulation, combining sounds with zaps to the tongue, may be an effective way to provide relief to tinnitus patients.  According to study co-author Tessie Eke, an associate professor of Engineer, drilling and otolaryngology at the [employer], this treatment targets a subset of brain cells that are firing abnormally. Through studies in both humans and animals, [facility] team and others previously reported that electrically stimulating touch-sensitive neurons in the tongue or face can activate neurons in the auditory system. Pairing these zaps with sounds appears to rewire brain circuits associated with tinnitus.

## 2019-10-13 NOTE — ED Triage Notes (Signed)
Patient states that she got a flu shot two weeks ago. Patient states four days after the flu shot she started having a cough. Patient states the cough has not stopped and it is keeping her up.

## 2019-10-15 ENCOUNTER — Telehealth: Payer: Self-pay

## 2019-10-15 NOTE — Telephone Encounter (Signed)
Copied from [place] [id number]. Topic: General - Inquiry >> Oct 15, 2019  2:47 PM Selinda Flavin B, [place] wrote: Reason for CRM: Patient calling and would like a call from the office to go over chest xray results form 10/13/2019 when she was at the hospital. Please advise.

## 2019-10-15 NOTE — Telephone Encounter (Signed)
This was only a 1 view CXR so it is not as good as 2 view CXR at seeing anything. They did not see pneumonia or acute changes on the CXR.

## 2019-10-16 NOTE — Telephone Encounter (Signed)
I would not make any decisions based on that CXR. Currently she is not have breathing problems so I would not have a reason to order a chest x-ray.

## 2019-10-16 NOTE — Telephone Encounter (Signed)
An albuterol treatment cannot help with breaking up phlegm. This is for breathing only and the medicine can cause other problems so I would not recommend. She can do a humidifier with water in it to help with the congestion as this will help more.

## 2019-10-16 NOTE — Telephone Encounter (Signed)
Patient is wanting to know if she should do a 2 view CXR or is this something not to worry about

## 2019-10-16 NOTE — Telephone Encounter (Signed)
The plaque is fairly common given her age and has been seen before. The other finding is not reliable and as stated before 1 view CXR are not always reliable and I cannot make any judgements based on this.

## 2019-10-16 NOTE — Telephone Encounter (Signed)
Patient wants to know what his means below.  Atherosclerotic plaque within the thoracic aorta. Bilateral medial basilar heterogeneous opacities are unchanged and favored to represent atelectasis and/or a prominent epicardial fat pad

## 2019-10-16 NOTE — Telephone Encounter (Signed)
Patient also wanting to know if she was to go out and buy a breathing treatment machine for albuterol to help break up the phlegm she is having trouble getting up would you prescribe the albuterol to go with it? States she can feel it in her chest and that the ED stated that they would of given her one but they dont do that right now because of covid.

## 2019-10-17 ENCOUNTER — Encounter (INDEPENDENT_AMBULATORY_CARE_PROVIDER_SITE_OTHER): Payer: Self-pay

## 2019-10-17 DIAGNOSIS — H47012 Ischemic optic neuropathy, left eye: Secondary | ICD-10-CM | POA: Diagnosis not present

## 2019-10-17 DIAGNOSIS — H40053 Ocular hypertension, bilateral: Secondary | ICD-10-CM | POA: Diagnosis not present

## 2019-10-17 DIAGNOSIS — Z961 Presence of intraocular lens: Secondary | ICD-10-CM | POA: Diagnosis not present

## 2019-10-17 DIAGNOSIS — H16223 Keratoconjunctivitis sicca, not specified as Sjogren's, bilateral: Secondary | ICD-10-CM | POA: Diagnosis not present

## 2019-10-17 NOTE — Telephone Encounter (Signed)
Pt.notified

## 2019-10-18 ENCOUNTER — Encounter: Payer: Self-pay | Admitting: Internal Medicine

## 2019-10-18 NOTE — Telephone Encounter (Signed)
Mychart sent to patient about same.

## 2019-10-18 NOTE — Telephone Encounter (Signed)
Patient states that she reserved a message stating she was placed in a home monitoring program and that she has to answer this questionnaire every day for COVID patient would like this out of her chart as she is not COVID positive and wants to know why she is getting it.

## 2019-10-18 NOTE — Telephone Encounter (Signed)
Patient informed of Mychart message stated understanding

## 2019-10-18 NOTE — Telephone Encounter (Signed)
Patient requesting call back from [place]. She states it is urgent. Pt would not disclose any additional information.

## 2019-10-24 ENCOUNTER — Telehealth: Payer: Self-pay | Admitting: *Deleted

## 2019-10-24 NOTE — Telephone Encounter (Signed)
Copied from [place] [id number]. Topic: General - Inquiry >> Oct 24, 2019  9:57 AM Rutherford Nail, NT wrote: Reason for CRM: Patient calling and is requesting a call back from Dr Nathanial Millman assistant. States that she went to the ED a few weeks ago for a cough after getting shingrix and flu injection. States that the cough has come back and would like to discuss. Please advise.

## 2019-10-24 NOTE — Telephone Encounter (Signed)
I called pt- her cough started back last night. She also c/o hoarseness. She started back taking the albuterol inhaler, twice today and cough syrup as directed. She is using her humidifier.    She wants to know if she can have a Rx for a nebulizer and medication so she can do treatments at home. She requests this be sent to [facility] supply store on [address]. Please advise.   Patient is aware MD is out of office this afternoon.

## 2019-10-25 NOTE — Telephone Encounter (Signed)
No, as previously discussed with patient in another note a nebulizer machine is for problems with breathing and the medicine that goes in the machine can cause palpitations, anxiety if not needed. It will not help with the hoarseness at all. Typically does not help with cough either. That medicine is only for problems breathing which she does not have.

## 2019-10-26 ENCOUNTER — Other Ambulatory Visit: Payer: Self-pay | Admitting: Internal Medicine

## 2019-10-31 ENCOUNTER — Other Ambulatory Visit: Payer: Self-pay | Admitting: Internal Medicine

## 2019-10-31 DIAGNOSIS — F5104 Psychophysiologic insomnia: Secondary | ICD-10-CM

## 2019-10-31 NOTE — Telephone Encounter (Signed)
Patient called to speak with the nurse regarding her prescription for zolpidem (AMBIEN) 10 MG tablet Patient stated that there is a mixup at the pharmacy because they only had 15 pills in the bottle.  CB# [phone]

## 2019-10-31 NOTE — Telephone Encounter (Signed)
Routing to CMA 

## 2019-11-01 ENCOUNTER — Other Ambulatory Visit: Payer: Self-pay | Admitting: Internal Medicine

## 2019-11-01 DIAGNOSIS — F5104 Psychophysiologic insomnia: Secondary | ICD-10-CM

## 2019-11-01 NOTE — Telephone Encounter (Signed)
Control database checked last refill: 09/29/2019 30 tabs LOV: 09/27/2019 [id number]   Called pharmacy has no refills the ones they had are expired

## 2019-11-01 NOTE — Telephone Encounter (Signed)
For problems with pharmacy dispense she needs to deal directly with pharmacy as we have no authority over this.

## 2019-11-01 NOTE — Telephone Encounter (Signed)
There is no reason patient should of only got 15 pills right. I see she got full refills from [place]?

## 2019-11-01 NOTE — Telephone Encounter (Signed)
Please call pt: [phone]

## 2019-12-10 DIAGNOSIS — H6123 Impacted cerumen, bilateral: Secondary | ICD-10-CM | POA: Diagnosis not present

## 2019-12-26 ENCOUNTER — Telehealth: Payer: Self-pay

## 2019-12-26 NOTE — Telephone Encounter (Signed)
Copied from [place] [id number]. Topic: General - Inquiry >> Dec 26, 2019  2:20 PM Stovall, Shana A wrote: Reason for CRM: pt called and stated [facility] , she got the results back but does not understand and would like to know if Dr Sharlet Salina could explain to her what it means  Best number - [phone]

## 2019-12-27 ENCOUNTER — Ambulatory Visit: Payer: No Typology Code available for payment source | Admitting: Internal Medicine

## 2019-12-27 NOTE — Telephone Encounter (Signed)
Pt can schedule a virtual or phone visit to discuss with PCP.

## 2019-12-31 ENCOUNTER — Telehealth: Payer: Self-pay | Admitting: Internal Medicine

## 2019-12-31 NOTE — Telephone Encounter (Signed)
Can take pepcid over the counter

## 2019-12-31 NOTE — Telephone Encounter (Signed)
Called pt, LVM.   

## 2019-12-31 NOTE — Telephone Encounter (Signed)
Pt says that she is having terrible heartburn in her upper throat. She is taking omeprazole (PRILOSEC) 40 MG capsule  There is not a lot that she is able to eat.  Is there another medication that the pt can take. Yesterday she took 2 pills and still had bad heartburn. Please advise.  cb is [phone]

## 2020-01-01 DIAGNOSIS — H4720 Unspecified optic atrophy: Secondary | ICD-10-CM | POA: Diagnosis not present

## 2020-01-01 DIAGNOSIS — H43813 Vitreous degeneration, bilateral: Secondary | ICD-10-CM | POA: Diagnosis not present

## 2020-01-01 DIAGNOSIS — H5319 Other subjective visual disturbances: Secondary | ICD-10-CM | POA: Diagnosis not present

## 2020-01-01 DIAGNOSIS — H47012 Ischemic optic neuropathy, left eye: Secondary | ICD-10-CM | POA: Diagnosis not present

## 2020-01-02 ENCOUNTER — Telehealth: Payer: Self-pay

## 2020-01-02 NOTE — Telephone Encounter (Signed)
Can have virtual visit or can call GI

## 2020-01-02 NOTE — Telephone Encounter (Signed)
Noted  

## 2020-01-02 NOTE — Telephone Encounter (Signed)
Pt stated that OTC pepcid and prilosec has never worked for her. Please advise.   Copied from [place] [phone]. Topic: General - Inquiry >> Jan 01, 2020  3:34 PM Alease Frame wrote: Reason for CRM: Patient is wanting a call back from Dr. Charlynne Cousins nurse regarding medication refill . Please advise.

## 2020-01-04 ENCOUNTER — Encounter: Payer: Self-pay | Admitting: Internal Medicine

## 2020-01-04 ENCOUNTER — Ambulatory Visit (INDEPENDENT_AMBULATORY_CARE_PROVIDER_SITE_OTHER): Payer: PPO | Admitting: Internal Medicine

## 2020-01-04 DIAGNOSIS — F4312 Post-traumatic stress disorder, chronic: Secondary | ICD-10-CM | POA: Diagnosis not present

## 2020-01-04 DIAGNOSIS — K219 Gastro-esophageal reflux disease without esophagitis: Secondary | ICD-10-CM | POA: Diagnosis not present

## 2020-01-04 MED ORDER — RABEPRAZOLE SODIUM 20 MG PO TBEC: 20.0000 mg | DELAYED_RELEASE_TABLET | Freq: Every day | ORAL | 6 refills | Status: DC

## 2020-01-04 MED ORDER — ALPRAZOLAM 1 MG PO TABS: 1.0000 mg | ORAL_TABLET | Freq: Three times a day (TID) | ORAL | 2 refills | Status: DC

## 2020-01-04 NOTE — Assessment & Plan Note (Signed)
Rx aciphex. She has tried and failed multiple other PPIs including omeprazole, protonix, nexium. Continue pepcid. Can use tums also if needed. She knows food triggers and does avoid those.

## 2020-01-04 NOTE — Progress Notes (Signed)
Virtual Visit via Video Note  I connected with Gabrielle [name] on 01/04/20 at  9:40 AM EST by a video enabled telemedicine application and verified that I am speaking with the correct person using two identifiers.  The patient and the provider were at separate locations throughout the entire encounter.   I discussed the limitations of evaluation and management by telemedicine and the availability of in person appointments. The patient expressed understanding and agreed to proceed. The patient and the provider were the only parties present for the visit unless noted in HPI below.  History of Present Illness: The patient is a 69 y.o. female with visit for GERD/throat pain and swallowing problems. Started years ago and has been taking omeprazole for some time with decent relief of symptoms. In the last week or so this is worsening. She has not been able to eat much of anything due to severe symptoms. Has tried pepcid which has not helped much. Denies fevers or chills or cough or SOB or abdominal pain or nausea/vomiting/diarrhea. Overall it is stable but bad.   Observations/Objective: Appearance: normal, breathing appears normal, no coughing or dyspnea during visit, casual grooming, abdomen does not appear distended, throat normal, memory normal, mental status is A and O times 3  Assessment and Plan: See problem oriented charting  Follow Up Instructions: rx aciphex, she has tried and failed omeprazole, nexium, protonix in the past  I discussed the assessment and treatment plan with the patient. The patient was provided an opportunity to ask questions and all were answered. The patient agreed with the plan and demonstrated an understanding of the instructions.   The patient was advised to call back or seek an in-person evaluation if the symptoms worsen or if the condition fails to improve as anticipated.  [name], MD

## 2020-01-08 ENCOUNTER — Telehealth: Payer: Self-pay | Admitting: Internal Medicine

## 2020-01-08 ENCOUNTER — Telehealth (HOSPITAL_COMMUNITY): Payer: Self-pay | Admitting: Radiology

## 2020-01-08 ENCOUNTER — Ambulatory Visit: Payer: Self-pay

## 2020-01-08 NOTE — Telephone Encounter (Signed)
Pt wanting information and RDA of Vitamin B12 and Vitamin C.  Called pt and informed her that in addition to natural sources (meat, poultry, fish, dairy food). Seven months ago, per result note, Dr Sharlet Salina wrote that pt's B12 was low normal and that she recommended pt to take an OTC B12 supplement. Pt stated that when she took an OTC multivitamin, she would have acid reflux. Advised pt to begin her rabeprazole first, then eat and then take multivitamin. Pt advised pt to take Vitamin C at least 2 hours after taking the B12. Advised pt to ask pharmacist regarding dosage that is available OTC.  Advised pt there is not a certain brand to buy. Pt wanted to know the name of the neurologist that she was referred to and informed pt the doctor's name is Metta Clines DO. Phone number provided.  Pt verbalized understanding.  Sending to office for Dr Sharlet Salina to review and offer any additional recommendations.   Reason for Disposition . General information question, no triage required and triager able to answer question  Answer Assessment - Initial Assessment Questions 1. REASON FOR CALL or QUESTION: "What is your reason for calling today?" or "How can I best help you?" or "What question do you have that I can help answer?"     Information about Vitamin B12 and Vitamin C  Protocols used: INFORMATION ONLY CALL - NO TRIAGE-A-AH

## 2020-01-08 NOTE — Telephone Encounter (Signed)
I'm not sure what RDA stands for in this note, do you know?

## 2020-01-08 NOTE — Telephone Encounter (Signed)
Copied from [place] [phone] [phone]. Topic: Referral - Request for Referral >> Jan 08, 2020  9:05 AM Reyne Dumas L wrote: Has patient seen PCP for this complaint? yes *If NO, is insurance requiring patient see PCP for this issue before PCP can refer them? Referral for which specialty: neurology Preferred provider/office: someone outside of [facility] Reason for referral: tinnitus

## 2020-01-08 NOTE — Telephone Encounter (Signed)
We can try if she likes but usually neurologists do not see tinnitus referrals. Is there a specific neurologist she wants?

## 2020-01-08 NOTE — Telephone Encounter (Signed)
Called pt, she wants to schedule another consult due to her tinnitus with Deveshwar. JM

## 2020-01-08 NOTE — Telephone Encounter (Signed)
I don't have any more information to add to this note.

## 2020-01-09 NOTE — Telephone Encounter (Signed)
Called pt, LVM.   

## 2020-01-11 ENCOUNTER — Ambulatory Visit
Admission: RE | Admit: 2020-01-11 | Discharge: 2020-01-11 | Disposition: A | Discharge status: Home / Self Care | Payer: Self-pay | Source: Ambulatory Visit | Attending: Interventional Radiology | Admitting: Interventional Radiology

## 2020-01-11 ENCOUNTER — Other Ambulatory Visit (HOSPITAL_COMMUNITY): Payer: Self-pay | Admitting: Interventional Radiology

## 2020-01-11 DIAGNOSIS — R52 Pain, unspecified: Secondary | ICD-10-CM

## 2020-01-11 DIAGNOSIS — H9313 Tinnitus, bilateral: Secondary | ICD-10-CM

## 2020-01-16 DIAGNOSIS — F411 Generalized anxiety disorder: Secondary | ICD-10-CM | POA: Diagnosis not present

## 2020-01-16 DIAGNOSIS — I10 Essential (primary) hypertension: Secondary | ICD-10-CM | POA: Diagnosis not present

## 2020-01-16 DIAGNOSIS — H9319 Tinnitus, unspecified ear: Secondary | ICD-10-CM | POA: Diagnosis not present

## 2020-01-16 DIAGNOSIS — E785 Hyperlipidemia, unspecified: Secondary | ICD-10-CM | POA: Diagnosis not present

## 2020-01-16 DIAGNOSIS — Z0000 Encounter for general adult medical examination without abnormal findings: Secondary | ICD-10-CM | POA: Diagnosis not present

## 2020-01-16 DIAGNOSIS — M545 Low back pain: Secondary | ICD-10-CM | POA: Diagnosis not present

## 2020-01-16 DIAGNOSIS — D696 Thrombocytopenia, unspecified: Secondary | ICD-10-CM | POA: Diagnosis not present

## 2020-01-16 DIAGNOSIS — R8281 Pyuria: Secondary | ICD-10-CM | POA: Diagnosis not present

## 2020-01-18 ENCOUNTER — Other Ambulatory Visit: Payer: Self-pay

## 2020-01-18 ENCOUNTER — Ambulatory Visit (HOSPITAL_COMMUNITY)
Admission: RE | Admit: 2020-01-18 | Discharge: 2020-01-18 | Disposition: A | Discharge status: Home / Self Care | Payer: PPO | Source: Ambulatory Visit | Attending: Interventional Radiology | Admitting: Interventional Radiology

## 2020-01-18 DIAGNOSIS — H0288A Meibomian gland dysfunction right eye, upper and lower eyelids: Secondary | ICD-10-CM | POA: Diagnosis not present

## 2020-01-18 DIAGNOSIS — H04123 Dry eye syndrome of bilateral lacrimal glands: Secondary | ICD-10-CM | POA: Diagnosis not present

## 2020-01-18 DIAGNOSIS — H47012 Ischemic optic neuropathy, left eye: Secondary | ICD-10-CM | POA: Diagnosis not present

## 2020-01-18 DIAGNOSIS — H0288B Meibomian gland dysfunction left eye, upper and lower eyelids: Secondary | ICD-10-CM | POA: Diagnosis not present

## 2020-01-18 DIAGNOSIS — Z9889 Other specified postprocedural states: Secondary | ICD-10-CM | POA: Diagnosis not present

## 2020-01-18 DIAGNOSIS — H16223 Keratoconjunctivitis sicca, not specified as Sjogren's, bilateral: Secondary | ICD-10-CM | POA: Diagnosis not present

## 2020-01-18 DIAGNOSIS — H9313 Tinnitus, bilateral: Secondary | ICD-10-CM

## 2020-01-18 DIAGNOSIS — H11823 Conjunctivochalasis, bilateral: Secondary | ICD-10-CM | POA: Diagnosis not present

## 2020-01-18 NOTE — Progress Notes (Signed)
Chief Complaint: Patient was seen in consultation today for worsening bilateral tinnitus.  Supervising Physician: Luanne Bras  Patient Status: [facility] - Out-pt  History of Present Illness: Gabrielle [name] is a 69 y.o. female with a past medical history as below, with pertinent past medical history including anxiety, insomnia, glaucoma, bilateral cataracts, HTN and bilateral tinnitus who presents today for a repeat consultation regarding her worsening tinnitus. Ms. Alles was seen by our service previously on 06/08/2017 for bilateral tinnitus as well as intermittent clicking in her right ear - after discussion MRI with and w/o contrast + MRV was offered, however the patient chose not to pursue this option at that time. She has since had worsening tinnitus over the last few years with acute worsening in the last several months, as such she has requested that she be seen by Dr. Estanislado Pandy again to explore possible further evaluation/treatment.  Ms. Denker reports that she experiences bilateral tinnitus intermittently, stating "some days I have no symptoms at all, but then I pay for it the next day when my symptoms return." She states that when the tinnitus occurs it is typically worse in her left ear however it fluctuates all over her head. She describes the sound as "something frying on a frying pan at very very low heat, like a sizzle" - she denies any roaring/whooshing/waterfall type sounds, however she does endorse a "rushing sound sometimes in my brain" which is bothersome to her. She also reports occasional pulsatile nature to the tinnitus which occurs possibly once a week or once every other week. She uses a sound machine and sound pillow at night which helps reduce the tinnitus, she also wears hearing aids bilaterally to help with noise reduction and states "I'm married to this one" while pointing to her left ear because when she removes the hearing aid the noise gets significantly worse and  is essentially unbearable. She reports bilateral shoulder pain for many years. 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 She has not seen another ENT who specializes in this type of issue/surgery, however she does see Dr. Constance Holster frequently for wax buildup and inner ear issues. She states that Dr. Constance Holster does not have any idea why she is experiencing these symptoms. She has seen an acupuncturist several times with improvement in her symptoms each time.   She has received multiple antibiotics in the past for "fluid behind my ear drums and fluctuating hearing" however she is unsure what type of fluid this is. She reports mostly taking Flagyl or Amoxicillin, she does not think she has ever been prescribed aminoglycoside antibiotics. She was also given steroids on one occasion by ENT which seemed to improve all of her above symptoms briefly.  She also states she was previously worked  up for giant cell arteritis by "sticking a needle in my arm and I immediately started vomiting" which she believes is indicative of a contrast allergy. She also reports that she had "blood tests for inflammation which were all negative" during this workup. She states this workup was done by a  retina specialist, she believes named Dr. Posey Pronto. She was also seen several times by Dr. Merrie Roof (neuro-ophthalmologist) for the same issues but states they did not find any further insight into her symptoms. She has also seen Dr. Vicie Mutters before and enjoyed the care she received from him, however he has relocated from [facility] so she has not seen him since, although she is very much open to seeing him again if needed.   She reports concerns that her issues could be related to her cranial nerves or her limbic system, she is wondering if imaging can see her cranial nerves and her limbic system. She has had several scans over the years and states that "nothing came of it but the words on the report are scary because I don't understand them." She has googled some of the words before but became too anxious so she stopped, she brings a CT head report as well as a letter from Dr. Danise Mina which has an explanation of several incidental findings that she would like Dr. Estanislado Pandy to explain to her. She is wondering if she were to undergo further imaging would it be possible to find the root cause of her issues and suggest a possible treatment - she states that she does not want to have an MRI again if not absolutely necessary because the noise bothers her very much, even with ear plugs in, and she cannot handle it. She is open to CT scan if needed.   Past Medical History:  Diagnosis Date  . Anemia    as a child  . Anxiety    takes Xanax daily  . Arthritis   . Cataracts, bilateral    immature  . Chronic insomnia 10/11/2014  . Complication of anesthesia   . Constipation    will occasionally take Milk of Mag  . Diverticulosis   . Dizziness    rarely  . GERD (gastroesophageal reflux disease)    takes Omeprazole every other day  . Glaucoma    borderline and no drops required  . Hearing loss    but doesn't have hearing aids  . History of bronchitis    many many yrs ago  . History of colitis   .  History of colon polyps   . Hypertension    hasn't been on meds for the past 31yrs   . Insomnia    takes Trazodone nightly as needed and Ambien nightly   . 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 Joint pain   . Joint swelling   . PONV (postoperative nausea and vomiting)   . Tinnitus    takes HCTZ daily to decrease pressure in ears    Past Surgical History:  Procedure Laterality Date  . ABDOMINAL HYSTERECTOMY    . APPENDECTOMY    . COLONOSCOPY    . IR RADIOLOGIST EVAL & MGMT  06/08/2017  . tinnitus Right   . TONSILLECTOMY    . TOTAL KNEE ARTHROPLASTY Right 03/13/2014   DR MURPHY  . TOTAL KNEE ARTHROPLASTY Right 03/13/2014   Procedure: TOTAL KNEE ARTHROPLASTY;  Surgeon: [name], MD;  Location: [place];  Service: Orthopedics;  Laterality: Right;  . TUBAL LIGATION  Allergies: Shellfish allergy, Contrast media [iodinated diagnostic agents], Hydrocodone, Metronidazole, Acetazolamide, and Gabapentin  Medications: Prior to Admission medications   Medication Sig Start Date End Date Taking? Authorizing Provider  acetaminophen (TYLENOL) 500 MG tablet Take 500 mg by mouth every 6 (six) hours as needed.    [provider]  ALPRAZolam Duanne Moron) 1 MG tablet Take 1 tablet (1 mg total) by mouth 3 (three) times daily. 01/04/20   [name], MD  amoxicillin-clavulanate (AUGMENTIN) 875-125 MG tablet Take 1 tablet by mouth 2 (two) times daily. One po bid x 7 days 10/13/19   Mesner, Corene Cornea, MD  benzonatate (TESSALON) 200 MG capsule Take 1 capsule (200 mg total) by mouth 3 (three) times daily as needed for cough. 10/08/19   [name], MD  carboxymethylcellulose (REFRESH PLUS) 0.5 % SOLN 1 drop 3 (three) times daily as needed. Uses kind with no preservatives Only can see in R eye    [provider]  chlorpheniramine-HYDROcodone (TUSSIONEX PENNKINETIC ER) 10-8 MG/5ML SUER Take 5 mLs by mouth every 12 (twelve) hours as needed for cough. 10/13/19   Mesner,  Corene Cornea, MD  cholecalciferol (VITAMIN D) 1000 UNITS tablet Take 2,000 Units by mouth daily. Reported on 06/01/2016    [provider]  HYDROcodone-homatropine (HYCODAN) 5-1.5 MG/5ML syrup Take 5 mLs by mouth every 6 (six) hours as needed for cough. 10/13/19   Mesner, Corene Cornea, MD  Multiple Vitamins-Minerals (MULTIVITAMIN WITH MINERALS) tablet Take 1 tablet by mouth daily.    [provider]  omeprazole (PRILOSEC) 40 MG capsule TAKE 1 CAPSULE BY MOUTH EVERY DAY 10/26/19   [name], MD  polyethylene glycol [facility]) packet Take 17 g by mouth daily as needed.     [provider]  RABEprazole (ACIPHEX) 20 MG tablet Take 1 tablet (20 mg total) by mouth daily. 01/04/20   [name], MD  RESTASIS 0.05 % ophthalmic emulsion  10/12/18   [provider]  sucralfate (CARAFATE) 1 GM/10ML suspension Take 10 mLs (1 g total) by mouth 4 (four) times daily -  with meals and at bedtime. 10/13/18   [name], MD  zolpidem (AMBIEN) 10 MG tablet TAKE 1 TO 1 & 1/2 TABLET AT BEDTIME AS NEEDED FOR SLEEP 11/01/19   [name], MD     Family History  Problem Relation Age of Onset  . Cancer - Other Father   . Kidney disease Father   . Cancer - Other Sister        breast ca  . Cancer - Other Brother        lymphoma in remission  . Cancer - Other Sister        in remission breast ca    Social History   Socioeconomic History  . Marital status: Divorced    Spouse name: Not on file  . Number of children: 1  . Years of education: Not on file  . Highest education level: Not on file  Occupational History  . Occupation: retired  Tobacco Use  . Smoking status: Former Smoker    Packs/day: 1.00    Years: 22.00    Pack years: 22.00    Types: Cigarettes    Start date: 03/08/1972    Quit date: 01/08/1994    Years since quitting: 26.0  . Smokeless tobacco: Never Used  . Tobacco comment: quit smoking 84yrs ago  Substance and Sexual Activity  .  Alcohol use: No    Alcohol/week: 0.0 standard drinks  .  Drug use: No  . Sexual activity: Not Currently    Birth control/protection: Surgical  Other Topics Concern  . Not on file  Social History Narrative   Right handed, Caffeine none, Divorced, 2 kids,  PT - Housing auth in [place],   13.5 yrs school; one level home   Social Determinants of Health   Financial Resource Strain:   . Difficulty of Paying Living Expenses: Not on file  Food Insecurity:   . Worried About Charity fundraiser in the Last Year: Not on file  . Ran Out of Food in the Last Year: Not on file  Transportation Needs:   . Lack of Transportation (Medical): Not on file  . Lack of Transportation (Non-Medical): Not on file  Physical Activity:   . Days of Exercise per Week: Not on file  . Minutes of Exercise per Session: Not on file  Stress: No Stress Concern Present  . Feeling of Stress : Only a little  Social Connections:   . Frequency of Communication with Friends and Family: Not on file  . Frequency of Social Gatherings with Friends and Family: Not on file  . Attends Religious Services: Not on file  . Active Member of Clubs or Organizations: Not on file  . Attends Archivist Meetings: Not on file  . Marital Status: Not on file     Review of Systems: A 12 point ROS discussed and pertinent positives are indicated in the HPI above.  All other systems are negative.  Review of Systems  Constitutional: Negative for appetite change, chills and fever.  HENT: Positive for hearing loss and tinnitus. Negative for ear discharge, ear pain, facial swelling, sinus pressure, sinus pain, trouble swallowing and voice change.   Eyes: Positive for visual disturbance ("I can only see shadows and darkness in my left eye unless I move it a certain way" reports she is leagally blind in this eye). Negative for photophobia, pain and redness.  Respiratory: Negative for cough and shortness of breath.   Cardiovascular: Negative for  chest pain and leg swelling.  Gastrointestinal: Negative for abdominal pain, diarrhea, nausea and vomiting.  Genitourinary: Negative for dysuria.  Musculoskeletal: Positive for arthralgias and myalgias. Negative for back pain, joint swelling, neck pain and neck stiffness.  Skin: Negative for rash and wound.  Neurological: Positive for headaches ("occasionally"). Negative for dizziness, tremors, seizures, syncope, facial asymmetry, speech difficulty, weakness, light-headedness and numbness.  Psychiatric/Behavioral: Positive for sleep disturbance (insomnia). Negative for confusion. The patient is nervous/anxious (takes Xanax chronically).     Vital Signs: There were no vitals taken for this visit.  Physical Exam Constitutional:      General: She is not in acute distress.    Appearance: She is not ill-appearing.     Comments: Patient support person present during exam. She also asks that the [facility] administrative assistant be present during her consultation.   HENT:     Head: Normocephalic.  Eyes:     Extraocular Movements: Extraocular movements intact.     Pupils: Pupils are equal, round, and reactive to light.     Comments: Left visual fields relatively in tact on exam today. She reports that when covering her right eye she can distance with her left eye, however "everything is dark, I can see shadows of things." She states that moving her left eye a certain way to she can see more detail/faces.   Pulmonary:     Effort: Pulmonary effort is normal.  Skin:  General: Skin is warm and dry.  Neurological:     Mental Status: She is alert and oriented to person, place, and time.     Cranial Nerves: No cranial nerve deficit.     Gait: Gait normal.  Psychiatric:        Mood and Affect: Mood normal.        Behavior: Behavior normal.        Thought Content: Thought content normal.        Judgment: Judgment normal.      Imaging: No results found.  Labs:  CBC: Recent Labs     05/14/19 1250  WBC 8.0  HGB 13.8  HCT 40.9  PLT 121.0*    COAGS: No results for input(s): INR, APTT in the last 8760 hours.  BMP: Recent Labs    05/14/19 1250  NA 138  K 3.8  CL 103  CO2 27  GLUCOSE 81  BUN 11  CALCIUM 9.0  CREATININE 1.14    LIVER FUNCTION TESTS: Recent Labs    05/14/19 1250  BILITOT 0.4  AST 13  ALT 10  ALKPHOS 84  PROT 7.0  ALBUMIN 3.6    TUMOR MARKERS: No results for input(s): AFPTM, CEA, CA199, CHROMGRNA in the last 8760 hours.  Assessment and Plan:  69 y/o F with history as per HPI above who presents today for a repeat consultation regarding bilateral tinnitus and intermittent right ear clicking. As stated above she was previously seen by our service for the same complaints in 2018 however she did not wish to pursue further imaging offered at that time (MRI/MRV). She has since had worsening of her tinnitus and has seen several different specialists for this issue as well as several other bothersome issues, apparently mostly related to her middle/inner ear per her description. The tinnitus is now affecting her quality of life and she is motivated to pursue further options.   Dr. Estanislado Pandy was present for consultation, as well as [facility] administrative assistant per patient request.  Patient requested that Dr. Estanislado Pandy review her imaging report from a previous CT scan as she is confused about the wording. Report was reviewed by Dr. Estanislado Pandy who did not note any acute concerns, however he was not able to view the actual images.  We discussed today that given her description of symptoms as well as what medications have provided some intermittent relief that this is unlikely to be related to a vascular abnormality, although with the intermittent pulsatile nature she describes it is certainly a possibility. Additionally, we discussed that her reported symptoms currently are concerning for a middle/inner ear issue and are less likely related to something  that is treatable by NIR. MRI/MRV as well as CT temporal bones to evaluate ossicles was offered, however we discussed that at this point we would recommend a second opinion from an ENT who specializes in issues related to her inner/middle ear as well as experience with stapedius muscle surgery prior to proceeding with imaging to avoid incurring possibly unnecessary radiation and cost. Patient is in agreement with this plan and requests that we contact Dr. Thornell Mule' office to see if she can be seen by him as she was previously very pleased with the care she received from him.   Plan for follow-up:  1. Obtain second opinion regarding her tinnitus/hearing loss/intermittent clicking from ENT specialist (patient prefers Dr. Thornell Mule, our scheduler will call his office on her behalf to see if she is able to be seen by their office). If  she is not able to see Dr. Thornell Mule she was encouraged to find a different ENT provider who specializes in the above issues.  2. After we receive results from the second ENT opinion we will determine if further imaging is indicated and we will call patient at that time to schedule, if needed.  3. Encouraged patient to contact our office with any further questions or concerns.   All questions answered and concerns addressed.  Patient conveys understanding and agrees with plan.  Thank you for this interesting consult.  I greatly enjoyed meeting ELIETTE EAKEN and look forward to participating in their care.  A copy of this report was sent to the requesting provider on this date.  Electronically Signed: Joaquim Nam, PA-C 01/18/2020, 10:36 AM   I spent a total of 80 Minutes in face to face in clinical consultation, greater than 50% of which was counseling/coordinating care for bilateral tinnitus/right ear clicking.

## 2020-01-28 ENCOUNTER — Other Ambulatory Visit: Payer: PPO

## 2020-01-29 ENCOUNTER — Telehealth (HOSPITAL_COMMUNITY): Payer: Self-pay | Admitting: Radiology

## 2020-01-29 NOTE — Telephone Encounter (Signed)
Pt called again to see if Dr. Estanislado Pandy could reach out to Dr. Vicie Mutters for a second opinion. She also wanted to know if I was able to get the appointment I made for her with Dr. Thornell Mule moved up from May 08, 2020. I told her that I emailed Dr. Elwyn Reach last week but have not gotten a response from him as of yet. I told her I would pass this information along to Dr. Estanislado Pandy today to see if he there was anything he could do to assist in her getting an earlier appointment. In box message sent to [place], [place]

## 2020-01-30 ENCOUNTER — Telehealth (HOSPITAL_COMMUNITY): Payer: Self-pay | Admitting: Radiology

## 2020-01-30 NOTE — Telephone Encounter (Signed)
Called pt, told her that Dr. Thornell Mule did email me back. He will work with his scheduler to try to get Gabrielle [name] an earlier appt. I told Gabrielle [name] to call me back if she has not heard from his office in the next few days. JM

## 2020-02-05 DIAGNOSIS — Z9889 Other specified postprocedural states: Secondary | ICD-10-CM | POA: Diagnosis not present

## 2020-02-05 DIAGNOSIS — H47012 Ischemic optic neuropathy, left eye: Secondary | ICD-10-CM | POA: Diagnosis not present

## 2020-02-05 DIAGNOSIS — H0288B Meibomian gland dysfunction left eye, upper and lower eyelids: Secondary | ICD-10-CM | POA: Diagnosis not present

## 2020-02-05 DIAGNOSIS — H11823 Conjunctivochalasis, bilateral: Secondary | ICD-10-CM | POA: Diagnosis not present

## 2020-02-05 DIAGNOSIS — H0288A Meibomian gland dysfunction right eye, upper and lower eyelids: Secondary | ICD-10-CM | POA: Diagnosis not present

## 2020-02-05 DIAGNOSIS — H16223 Keratoconjunctivitis sicca, not specified as Sjogren's, bilateral: Secondary | ICD-10-CM | POA: Diagnosis not present

## 2020-02-05 DIAGNOSIS — D3132 Benign neoplasm of left choroid: Secondary | ICD-10-CM | POA: Diagnosis not present

## 2020-02-05 DIAGNOSIS — H40053 Ocular hypertension, bilateral: Secondary | ICD-10-CM | POA: Diagnosis not present

## 2020-02-05 DIAGNOSIS — H04123 Dry eye syndrome of bilateral lacrimal glands: Secondary | ICD-10-CM | POA: Diagnosis not present

## 2020-02-05 DIAGNOSIS — Z961 Presence of intraocular lens: Secondary | ICD-10-CM | POA: Diagnosis not present

## 2020-02-05 DIAGNOSIS — H43811 Vitreous degeneration, right eye: Secondary | ICD-10-CM | POA: Diagnosis not present

## 2020-02-06 ENCOUNTER — Telehealth: Payer: Self-pay | Admitting: Internal Medicine

## 2020-02-06 NOTE — Telephone Encounter (Signed)
    Patient wants to know if she should get the  Covid vaccine with her ongoing health issues

## 2020-02-06 NOTE — Telephone Encounter (Signed)
Called pt, LVM.   

## 2020-02-07 DIAGNOSIS — H9202 Otalgia, left ear: Secondary | ICD-10-CM | POA: Diagnosis not present

## 2020-02-13 ENCOUNTER — Telehealth: Payer: Self-pay

## 2020-02-13 NOTE — Telephone Encounter (Signed)
F/u  The patient's calls stated she never received a call last week from the nurses regarding her COVID vaccine question.    The patient was informed of timestamps messages from the nurse voice mail was left.    The patient is asking for a call back to discuss, COVID vaccine appt is tomorrow.

## 2020-02-13 NOTE — Telephone Encounter (Signed)
Pt has been informed and expressed understanding.  

## 2020-02-13 NOTE — Telephone Encounter (Signed)
Pt is concerned about an ingredient called "PEG MRNA" that is in the COVID vaccine and would like to know if she should proceed. She also would like to know would the vaccine make her tinnitus worse? Please advise.

## 2020-02-13 NOTE — Telephone Encounter (Signed)
I would recommend to get vaccine. There are no reports of tinnitus with this vaccine as of yet.

## 2020-02-18 NOTE — Telephone Encounter (Signed)
Noted  

## 2020-02-18 NOTE — Telephone Encounter (Signed)
   Patient calling, states she was turned away when trying to get covid vaccine because of possibility of having allergic reaction. Patient has several questions would ike to only speak with Dr Gabrielle [name]. Virtual appointment scheduled for 2/24

## 2020-02-20 ENCOUNTER — Ambulatory Visit (INDEPENDENT_AMBULATORY_CARE_PROVIDER_SITE_OTHER): Payer: PPO | Admitting: Internal Medicine

## 2020-02-20 DIAGNOSIS — H9313 Tinnitus, bilateral: Secondary | ICD-10-CM | POA: Diagnosis not present

## 2020-02-20 NOTE — Progress Notes (Signed)
Virtual Visit via Video Note  I connected with Gabrielle [name] on 02/20/20 at  9:20 AM EST by a video enabled telemedicine application and verified that I am speaking with the correct person using two identifiers.  The patient and the provider were at separate locations throughout the entire encounter.   I discussed the limitations of evaluation and management by telemedicine and the availability of in person appointments. The patient expressed understanding and agreed to proceed. The patient and the provider were the only parties present for the visit unless noted in HPI below.  History of Present Illness: The patient is a 69 y.o. female with visit for concerns about her tinnitus and questions about the covid-19 vaccines. She will be going to a specialist at [facility] for the tinnitus and she is hoping that they can help her. She had worsening about 2 months after getting shingrix and feels that this was related. She was trying to explain this to a nurse at covid-19 vaccine clinic and was turned away from getting the shot.   Observations/Objective: Appearance: normal, breathing appears normal, casual grooming, abdomen does not appear distended, throat normal, memory normal, mental status is A and O times 3  Assessment and Plan: See problem oriented charting  Follow Up Instructions: ok to get vaccine, she elects to wait to J and J after discussion  Visit time 20 minutes in face to face communication with patient and coordination of care, additional 10 minutes spent in record review, coordination or care, ordering tests, communicating/referring to other healthcare professionals, documenting in medical records all on the same day of the visit for total time 30 minutes spent on the visit.    I discussed the assessment and treatment plan with the patient. The patient was provided an opportunity to ask questions and all were answered. The patient agreed with the plan and demonstrated an understanding of the  instructions.   The patient was advised to call back or seek an in-person evaluation if the symptoms worsen or if the condition fails to improve as anticipated.  [name], MD

## 2020-02-22 ENCOUNTER — Encounter: Payer: Self-pay | Admitting: Internal Medicine

## 2020-02-22 NOTE — Assessment & Plan Note (Signed)
We discussed current lack of information about those with chronic tinnitus and the vaccine but currently no side effect of tinnitus known with covid-19 vaccination. She wishes to get 1 shot series J and J if approved soon.

## 2020-02-27 NOTE — Progress Notes (Deleted)
Subjective:    Patient ID: Gabrielle [name], female    DOB: 1951-01-30, 69 y.o.   MRN: [id number]  HPI The patient is here for an acute visit.   BP issues:     amlodoipine 5   Medications and allergies reviewed with patient and updated if appropriate.  Patient Active Problem List   Diagnosis Date Noted  . Injection site reaction 08/15/2019  . Malodorous urine 12/15/2018  . Elevated blood pressure reading in office without diagnosis of hypertension 12/15/2018  . Blindness 09/14/2018  . GERD (gastroesophageal reflux disease) 04/21/2018  . Chronic post-traumatic stress disorder (PTSD) 03/14/2018  . Tinnitus 03/14/2018  . Routine general medical examination at a health care facility 03/14/2018  . Abnormality of gait 06/03/2015  . S/P TKR (total knee replacement) 06/03/2015  . Chronic left SI joint pain 06/03/2015  . Chronic insomnia 10/11/2014  . DJD (degenerative joint disease) of knee 03/13/2014    Current Outpatient Medications on File Prior to Visit  Medication Sig Dispense Refill  . acetaminophen (TYLENOL) 500 MG tablet Take 500 mg by mouth every 6 (six) hours as needed.    . ALPRAZolam (XANAX) 1 MG tablet Take 1 tablet (1 mg total) by mouth 3 (three) times daily. 90 tablet 2  . amoxicillin-clavulanate (AUGMENTIN) 875-125 MG tablet Take 1 tablet by mouth 2 (two) times daily. One po bid x 7 days 14 tablet 0  . benzonatate (TESSALON) 200 MG capsule Take 1 capsule (200 mg total) by mouth 3 (three) times daily as needed for cough. 60 capsule 0  . carboxymethylcellulose (REFRESH PLUS) 0.5 % SOLN 1 drop 3 (three) times daily as needed. Uses kind with no preservatives Only can see in R eye    . chlorpheniramine-HYDROcodone (TUSSIONEX PENNKINETIC ER) 10-8 MG/5ML SUER Take 5 mLs by mouth every 12 (twelve) hours as needed for cough. 140 mL 0  . cholecalciferol (VITAMIN D) 1000 UNITS tablet Take 2,000 Units by mouth daily. Reported on 06/01/2016    . HYDROcodone-homatropine (HYCODAN)  5-1.5 MG/5ML syrup Take 5 mLs by mouth every 6 (six) hours as needed for cough. 120 mL 0  . Multiple Vitamins-Minerals (MULTIVITAMIN WITH MINERALS) tablet Take 1 tablet by mouth daily.    Marland Kitchen omeprazole (PRILOSEC) 40 MG capsule TAKE 1 CAPSULE BY MOUTH EVERY DAY 30 capsule 2  . polyethylene glycol (MIRALAX) packet Take 17 g by mouth daily as needed.     . RABEprazole (ACIPHEX) 20 MG tablet Take 1 tablet (20 mg total) by mouth daily. 30 tablet 6  . RESTASIS 0.05 % ophthalmic emulsion     . sucralfate (CARAFATE) 1 GM/10ML suspension Take 10 mLs (1 g total) by mouth 4 (four) times daily -  with meals and at bedtime. 420 mL 0  . zolpidem (AMBIEN) 10 MG tablet TAKE 1 TO 1 & 1/2 TABLET AT BEDTIME AS NEEDED FOR SLEEP 30 tablet 4   No current facility-administered medications on file prior to visit.    Past Medical History:  Diagnosis Date  . Anemia    as a child  . Anxiety    takes Xanax daily  . Arthritis   . Cataracts, bilateral    immature  . Chronic insomnia 10/11/2014  . Complication of anesthesia   . Constipation    will occasionally take Milk of Mag  . Diverticulosis   . Dizziness    rarely  . GERD (gastroesophageal reflux disease)    takes Omeprazole every other day  . Glaucoma  borderline and no drops required  . Hearing loss    but doesn't have hearing aids  . History of bronchitis    many many yrs ago  . History of colitis   . History of colon polyps   . Hypertension    hasn't been on meds for the past 64yrs   . Insomnia    takes Trazodone nightly as needed and Ambien nightly   . 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 Joint pain   . Joint swelling   . PONV (postoperative nausea and vomiting)   . Tinnitus    takes HCTZ daily to decrease pressure in ears    Past Surgical History:  Procedure Laterality Date  . ABDOMINAL HYSTERECTOMY    . APPENDECTOMY    . COLONOSCOPY    . IR RADIOLOGIST EVAL & MGMT  06/08/2017  . tinnitus Right   . TONSILLECTOMY    . TOTAL  KNEE ARTHROPLASTY Right 03/13/2014   DR MURPHY  . TOTAL KNEE ARTHROPLASTY Right 03/13/2014   Procedure: TOTAL KNEE ARTHROPLASTY;  Surgeon: [name], MD;  Location: [facility];  Service: Orthopedics;  Laterality: Right;  . TUBAL LIGATION      Social History   Socioeconomic History  . Marital status: Divorced    Spouse name: Not on file  . Number of children: 1  . Years of education: Not on file  . Highest education level: Not on file  Occupational History  . Occupation: retired  Tobacco Use  . Smoking status: Former Smoker    Packs/day: 1.00    Years: 22.00    Pack years: 22.00    Types: Cigarettes    Start date: 03/08/1972    Quit date: 01/08/1994    Years since quitting: 26.1  . Smokeless tobacco: Never Used  . Tobacco comment: quit smoking 16yrs ago  Substance and Sexual Activity  . Alcohol use: No    Alcohol/week: 0.0 standard drinks  . Drug use: No  . Sexual activity: Not Currently    Birth control/protection: Surgical  Other Topics Concern  . Not on file  Social History Narrative   Right handed, Caffeine none, Divorced, 2 kids,  PT - Housing auth in [place],   13.5 yrs school; one level home   Social Determinants of Health   Financial Resource Strain:   . Difficulty of Paying Living Expenses: Not on file  Food Insecurity:   . Worried About Charity fundraiser in the Last Year: Not on file  . Ran Out of Food in the Last Year: Not on file  Transportation Needs:   . Lack of Transportation (Medical): Not on file  . Lack of Transportation (Non-Medical): Not on file  Physical Activity:   . Days of Exercise per Week: Not on file  . Minutes of Exercise per Session: Not on file  Stress: No Stress Concern Present  . Feeling of Stress : Only a little  Social Connections:   . Frequency of Communication with Friends and Family: Not on file  . Frequency of Social Gatherings with Friends and Family: Not on file  . Attends Religious Services: Not on file  . Active Member of  Clubs or Organizations: Not on file  . Attends Archivist Meetings: Not on file  . Marital Status: Not on file    Family History  Problem Relation Age of Onset  . Cancer - Other Father   . Kidney disease Father   . Cancer - Other Sister  breast ca  . Cancer - Other Brother        lymphoma in remission  . Cancer - Other Sister        in remission breast ca    Review of Systems     Objective:  There were no vitals filed for this visit. BP Readings from Last 3 Encounters:  10/13/19 (!) 153/70  09/27/19 138/82  08/15/19 140/90   Wt Readings from Last 3 Encounters:  10/13/19 212 lb (96.2 kg)  09/27/19 214 lb (97.1 kg)  08/27/19 200 lb (90.7 kg)   There is no height or weight on file to calculate BMI.   Physical Exam         Assessment & Plan:    See Problem List for Assessment and Plan of chronic medical problems.     This visit occurred during the SARS-CoV-2 public health emergency.  Safety protocols were in place, including screening questions prior to the visit, additional usage of staff PPE, and extensive cleaning of exam room while observing appropriate contact time as indicated for disinfecting solutions.

## 2020-02-28 ENCOUNTER — Ambulatory Visit: Payer: PPO | Admitting: Internal Medicine

## 2020-02-28 DIAGNOSIS — H5462 Unqualified visual loss, left eye, normal vision right eye: Secondary | ICD-10-CM | POA: Insufficient documentation

## 2020-02-28 DIAGNOSIS — H903 Sensorineural hearing loss, bilateral: Secondary | ICD-10-CM | POA: Diagnosis not present

## 2020-02-28 DIAGNOSIS — H9313 Tinnitus, bilateral: Secondary | ICD-10-CM | POA: Diagnosis not present

## 2020-02-28 DIAGNOSIS — H538 Other visual disturbances: Secondary | ICD-10-CM | POA: Diagnosis not present

## 2020-03-03 ENCOUNTER — Ambulatory Visit: Payer: PPO | Admitting: Internal Medicine

## 2020-03-04 ENCOUNTER — Other Ambulatory Visit: Payer: Self-pay | Admitting: Otolaryngology

## 2020-03-04 ENCOUNTER — Other Ambulatory Visit (HOSPITAL_COMMUNITY): Payer: Self-pay | Admitting: Otolaryngology

## 2020-03-04 DIAGNOSIS — H9313 Tinnitus, bilateral: Secondary | ICD-10-CM

## 2020-03-07 DIAGNOSIS — H11823 Conjunctivochalasis, bilateral: Secondary | ICD-10-CM | POA: Diagnosis not present

## 2020-03-07 DIAGNOSIS — H0288B Meibomian gland dysfunction left eye, upper and lower eyelids: Secondary | ICD-10-CM | POA: Diagnosis not present

## 2020-03-07 DIAGNOSIS — H16223 Keratoconjunctivitis sicca, not specified as Sjogren's, bilateral: Secondary | ICD-10-CM | POA: Diagnosis not present

## 2020-03-07 DIAGNOSIS — H0288A Meibomian gland dysfunction right eye, upper and lower eyelids: Secondary | ICD-10-CM | POA: Diagnosis not present

## 2020-03-07 DIAGNOSIS — H04123 Dry eye syndrome of bilateral lacrimal glands: Secondary | ICD-10-CM | POA: Diagnosis not present

## 2020-03-10 ENCOUNTER — Telehealth: Payer: Self-pay

## 2020-03-10 NOTE — Telephone Encounter (Signed)
New message    The patient is requesting a message sent to the [employer] or MD to review. Wondering should she get both test done please advise.     CTA appt on Friday,March 19th  with contrast 1:30pm   COVID vaccine Friday, March 19th @ 3:50pm

## 2020-03-11 NOTE — Telephone Encounter (Signed)
Pt advised about concerns, she verbalized understanding.

## 2020-03-11 NOTE — Telephone Encounter (Signed)
New message:   Pt is calling back to follow up on her question from yesterday. Please advise.

## 2020-03-11 NOTE — Telephone Encounter (Signed)
She can do both without any problems.

## 2020-03-14 ENCOUNTER — Other Ambulatory Visit: Payer: Self-pay

## 2020-03-14 ENCOUNTER — Ambulatory Visit (HOSPITAL_COMMUNITY)
Admission: RE | Admit: 2020-03-14 | Discharge: 2020-03-14 | Disposition: A | Discharge status: Home / Self Care | Payer: PPO | Source: Ambulatory Visit | Attending: Otolaryngology | Admitting: Otolaryngology

## 2020-03-14 DIAGNOSIS — H9313 Tinnitus, bilateral: Secondary | ICD-10-CM | POA: Diagnosis not present

## 2020-03-14 DIAGNOSIS — H9319 Tinnitus, unspecified ear: Secondary | ICD-10-CM | POA: Diagnosis not present

## 2020-03-14 LAB — POCT I-STAT CREATININE: Creatinine, Ser: 1 mg/dL (ref 0.44–1.00)

## 2020-03-14 MED ORDER — SODIUM CHLORIDE (PF) 0.9 % IJ SOLN
INTRAMUSCULAR | Status: AC
  Filled 2020-03-14: qty 50

## 2020-03-14 MED ORDER — IOHEXOL 350 MG/ML SOLN
100.0000 mL | Freq: Once | INTRAVENOUS | Status: AC | PRN
  Administered 2020-03-14: 100 mL via INTRAVENOUS

## 2020-03-19 DIAGNOSIS — D229 Melanocytic nevi, unspecified: Secondary | ICD-10-CM | POA: Diagnosis not present

## 2020-03-19 DIAGNOSIS — L309 Dermatitis, unspecified: Secondary | ICD-10-CM | POA: Diagnosis not present

## 2020-03-19 DIAGNOSIS — L821 Other seborrheic keratosis: Secondary | ICD-10-CM | POA: Diagnosis not present

## 2020-03-19 DIAGNOSIS — D225 Melanocytic nevi of trunk: Secondary | ICD-10-CM | POA: Diagnosis not present

## 2020-03-20 ENCOUNTER — Ambulatory Visit: Payer: PPO | Admitting: Internal Medicine

## 2020-03-27 ENCOUNTER — Ambulatory Visit: Payer: Self-pay

## 2020-03-27 ENCOUNTER — Ambulatory Visit: Payer: PPO | Attending: Internal Medicine

## 2020-03-27 DIAGNOSIS — Z23 Encounter for immunization: Secondary | ICD-10-CM

## 2020-03-27 NOTE — Progress Notes (Signed)
   Covid-19 Vaccination Clinic  Name:  Gabrielle [name]    MRN: [id number] DOB: 05/18/1951  03/27/2020  [name] was observed post Covid-19 immunization for 15 minutes without incident. She was provided with Vaccine Information Sheet and instruction to access the V-Safe system.   [name] was instructed to call 911 with any severe reactions post vaccine: Marland Kitchen Difficulty breathing  . Swelling of face and throat  . A fast heartbeat  . A bad rash all over body  . Dizziness and weakness   Immunizations Administered    Name Date Dose VIS Date Route   Pfizer COVID-19 Vaccine 03/27/2020 10:20 AM 0.3 mL 12/07/2019 Intramuscular   Manufacturer: [employer], [employer]   Lot: [id number]   [place]: [id number]

## 2020-04-02 ENCOUNTER — Institutional Professional Consult (permissible substitution): Payer: PPO | Admitting: Plastic Surgery

## 2020-04-02 ENCOUNTER — Telehealth: Payer: Self-pay

## 2020-04-02 ENCOUNTER — Telehealth: Payer: Self-pay | Admitting: Internal Medicine

## 2020-04-02 NOTE — Telephone Encounter (Signed)
New message   The patient wants the nurse to call her to discuss labs.

## 2020-04-02 NOTE — Telephone Encounter (Signed)
Error

## 2020-04-03 NOTE — Telephone Encounter (Signed)
New message:   Pt is calling back again to get a call back to discuss her lab results. Please advise.

## 2020-04-04 NOTE — Telephone Encounter (Signed)
Spoke with pt and she is scheduled to see Dr Sharlet Salina on Tuesday and will discuss having labs done

## 2020-04-07 ENCOUNTER — Ambulatory Visit: Payer: PPO | Admitting: Internal Medicine

## 2020-04-08 ENCOUNTER — Ambulatory Visit (INDEPENDENT_AMBULATORY_CARE_PROVIDER_SITE_OTHER): Payer: PPO | Admitting: Internal Medicine

## 2020-04-08 ENCOUNTER — Other Ambulatory Visit: Payer: Self-pay

## 2020-04-08 ENCOUNTER — Encounter: Payer: Self-pay | Admitting: Internal Medicine

## 2020-04-08 VITALS — BP 132/86 | HR 61 | Temp 98.2°F | Ht 64.0 in | Wt 215.2 lb

## 2020-04-08 DIAGNOSIS — F5104 Psychophysiologic insomnia: Secondary | ICD-10-CM

## 2020-04-08 DIAGNOSIS — H9313 Tinnitus, bilateral: Secondary | ICD-10-CM

## 2020-04-08 DIAGNOSIS — F4312 Post-traumatic stress disorder, chronic: Secondary | ICD-10-CM

## 2020-04-08 DIAGNOSIS — Z13220 Encounter for screening for lipoid disorders: Secondary | ICD-10-CM

## 2020-04-08 LAB — URINALYSIS, ROUTINE W REFLEX MICROSCOPIC
Bilirubin Urine: NEGATIVE
Ketones, ur: NEGATIVE
Nitrite: NEGATIVE
Specific Gravity, Urine: 1.02 (ref 1.000–1.030)
Total Protein, Urine: NEGATIVE
Urine Glucose: NEGATIVE
Urobilinogen, UA: 0.2 (ref 0.0–1.0)
pH: 6 (ref 5.0–8.0)

## 2020-04-08 LAB — LIPID PANEL
Cholesterol: 181 mg/dL (ref 0–200)
HDL: 45.2 mg/dL (ref >39.00)
LDL Cholesterol: 112 mg/dL — ABNORMAL HIGH (ref 0–99)
NonHDL: 136.25
Total CHOL/HDL Ratio: 4
Triglycerides: 119 mg/dL (ref 0.0–149.0)
VLDL: 23.8 mg/dL (ref 0.0–40.0)

## 2020-04-08 MED ORDER — ALPRAZOLAM 1 MG PO TABS: 1.0000 mg | ORAL_TABLET | Freq: Three times a day (TID) | ORAL | 2 refills | Status: DC

## 2020-04-08 MED ORDER — ZOLPIDEM TARTRATE 10 MG PO TABS: ORAL_TABLET | ORAL | 4 refills | Status: DC

## 2020-04-08 NOTE — Assessment & Plan Note (Signed)
Refill xanax and she was almost off this prior to the eye surgery problems and worsening tinnitus. She feels if she is able to get those manageable she will be able to wean off this again.

## 2020-04-08 NOTE — Assessment & Plan Note (Signed)
She is seeing neurology soon and discussed pathology of intracranial hypertension and how this is different from high blood pressure.

## 2020-04-08 NOTE — Patient Instructions (Signed)
We will check the urine and the cholesterol today.

## 2020-04-08 NOTE — Assessment & Plan Note (Signed)
Refill ambien and we discussed that most people who have taken ambien long term cannot just stop and sleep normally. There is a high occurrence of withdrawal insomnia once stopping ambien. Once tinnitus is improved we can discuss other options for sleep which are not addictive which is what she wants.

## 2020-04-08 NOTE — Progress Notes (Signed)
   Subjective:   Patient ID: Gabrielle [name], female    DOB: 09-Jan-1951, 69 y.o.   MRN: [id number]  HPI The patient is a 69 YO female coming in for concerns about sleeping (needs refill on Lorrin Mais, has concerns about this, states someone told her she could just stop and be fine, wants to switch once the tinnitus is under control) and anxiety (worse with recent covid-19 vaccine as she is worried about potential side effects after getting worsening tinnitus she associates with shingrix, denies SI/HI, not in counseling currently) and tinnitus (has seen many specialists for this and recently saw Dr. Thornell Mule who wants her to see neurology again to be evaluated for intracranial hypertension, seeing Dr. Lestine Mount Monday for assessment, this contributes a lot to her anxiety and sleep problems due to the intensity).   Review of Systems  Constitutional: Negative.   HENT: Positive for tinnitus.   Eyes: Negative.   Respiratory: Negative for cough, chest tightness and shortness of breath.   Cardiovascular: Negative for chest pain, palpitations and leg swelling.  Gastrointestinal: Negative for abdominal distention, abdominal pain, constipation, diarrhea, nausea and vomiting.  Musculoskeletal: Negative.   Skin: Negative.   Neurological: Negative.   Psychiatric/Behavioral: Positive for decreased concentration, dysphoric mood and sleep disturbance. The patient is nervous/anxious.     Objective:  Physical Exam Constitutional:      Appearance: She is well-developed.  HENT:     Head: Normocephalic and atraumatic.  Cardiovascular:     Rate and Rhythm: Normal rate and regular rhythm.  Pulmonary:     Effort: Pulmonary effort is normal. No respiratory distress.     Breath sounds: Normal breath sounds. No wheezing or rales.  Abdominal:     General: Bowel sounds are normal. There is no distension.     Palpations: Abdomen is soft.     Tenderness: There is no abdominal tenderness. There is no rebound.    Musculoskeletal:     Cervical back: Normal range of motion.  Skin:    General: Skin is warm and dry.  Neurological:     Mental Status: She is alert and oriented to person, place, and time.     Coordination: Coordination normal.     Vitals:   04/08/20 0928  BP: 132/86  Pulse: 61  Temp: 98.2 F (36.8 C)  TempSrc: Oral  SpO2: 97%  Weight: 215 lb 3.2 oz (97.6 kg)  Height: 5\' 4"  (1.626 m)    This visit occurred during the SARS-CoV-2 public health emergency.  Safety protocols were in place, including screening questions prior to the visit, additional usage of staff PPE, and extensive cleaning of exam room while observing appropriate contact time as indicated for disinfecting solutions.   Assessment & Plan:

## 2020-04-11 ENCOUNTER — Telehealth: Payer: Self-pay

## 2020-04-11 NOTE — Telephone Encounter (Signed)
Per chart MD release result to mychart... she stated Result Notes and Comments to Patient. Notified pt  w/MD response.Marland KitchenJohny Chess. Pt asked was lab faxed to the [place]. She had given information to the assistant. Inform pt the assistant that brought her back is not herer today, but if she call back w/fax #$ I will make sure it fax to them.Marland KitchenJohny Chess Comments not seen   Urine looks fine, cholesterol stable from last time. No changes needed.

## 2020-04-11 NOTE — Telephone Encounter (Signed)
New message    Calling back for labs results.   Asking for a call back
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
## 2020-04-14 ENCOUNTER — Ambulatory Visit (INDEPENDENT_AMBULATORY_CARE_PROVIDER_SITE_OTHER): Payer: PPO | Admitting: Neurology

## 2020-04-14 ENCOUNTER — Telehealth: Payer: Self-pay | Admitting: Neurology

## 2020-04-14 ENCOUNTER — Encounter: Payer: Self-pay | Admitting: Neurology

## 2020-04-14 ENCOUNTER — Other Ambulatory Visit: Payer: Self-pay

## 2020-04-14 VITALS — Ht 64.0 in | Wt 217.5 lb

## 2020-04-14 DIAGNOSIS — H9313 Tinnitus, bilateral: Secondary | ICD-10-CM

## 2020-04-14 DIAGNOSIS — H5462 Unqualified visual loss, left eye, normal vision right eye: Secondary | ICD-10-CM | POA: Diagnosis not present

## 2020-04-14 NOTE — Telephone Encounter (Signed)
No, only if there is evidence of increased pressure

## 2020-04-14 NOTE — Telephone Encounter (Signed)
Dr. Tomi Likens,   I spoke with pt and she states she thinks she misunderstood you during her visit. She states she thought you said even if you did not see any evidence of pressure you would still at least try her on one of the medication for relief.

## 2020-04-14 NOTE — Telephone Encounter (Signed)
Then I have no explanation for her symptoms.

## 2020-04-14 NOTE — Telephone Encounter (Signed)
Dr. Tomi Likens,  Before pt left today she was unsure if she had asked you these two additional questions. She asked to be called with the answers.  1) She wanted to know if you did not see fluid in her head would you still prescribe medication  2)She wanted to know do you recommend her have an eye exam to rule out swelling

## 2020-04-14 NOTE — Patient Instructions (Signed)
When you are ready, contact me and we will schedule you for a spinal tap to measure the pressure in your head

## 2020-04-14 NOTE — Telephone Encounter (Signed)
Dr. Tomi Likens,  I spoke with pt and informed her of your message. Pt wanted to know since you will only prescribe medication if there is an increased pressure what would be the next steps if that is not shown. Also during conversation, pt asked if I was who normally worked with provider, & which I informed her that I didn't, & she requested that Dr. Tomi Likens cma give her a call back with response as she may have additional questions that she may can answer.

## 2020-04-14 NOTE — Telephone Encounter (Signed)
I wouldn't prescribe the medication if there is no evidence of increased intracranial pressure.  I don't think repeat eye exam is warranted at this time as her symptoms are chronic and she has had eye exams within that time (even within the past year)

## 2020-04-15 NOTE — Telephone Encounter (Signed)
Faxed labs to [facility] per pt request../lmb

## 2020-04-15 NOTE — Telephone Encounter (Signed)
Error

## 2020-04-15 NOTE — Telephone Encounter (Signed)
New message:   Pt is calling to give the fax # to the [facility] to send her lab results. She states it needs to say attn [place]/Dr. Ishmael Holter [phone]

## 2020-04-22 ENCOUNTER — Ambulatory Visit: Payer: PPO | Attending: Internal Medicine

## 2020-04-22 DIAGNOSIS — Z23 Encounter for immunization: Secondary | ICD-10-CM

## 2020-04-22 NOTE — Progress Notes (Signed)
   Covid-19 Vaccination Clinic  Name:  Gabrielle [name]    MRN: [id number] DOB: [birth year]  04/22/2020  [name] was observed post Covid-19 immunization for 30 minutes based on pre-vaccination screening without incident. She was provided with Vaccine Information Sheet and instruction to access the V-Safe system.   [name] was instructed to call 911 with any severe reactions post vaccine: Marland Kitchen Difficulty breathing  . Swelling of face and throat  . A fast heartbeat  . A bad rash all over body  . Dizziness and weakness   Immunizations Administered    Name Date Dose VIS Date Route   Pfizer COVID-19 Vaccine 04/22/2020 10:27 AM 0.3 mL 02/20/2019 Intramuscular   Manufacturer: [place]   Lot: [id number]   [place]: [id number]

## 2020-04-23 DIAGNOSIS — H0288A Meibomian gland dysfunction right eye, upper and lower eyelids: Secondary | ICD-10-CM | POA: Diagnosis not present

## 2020-04-23 DIAGNOSIS — H0288B Meibomian gland dysfunction left eye, upper and lower eyelids: Secondary | ICD-10-CM | POA: Diagnosis not present

## 2020-04-23 DIAGNOSIS — H11823 Conjunctivochalasis, bilateral: Secondary | ICD-10-CM | POA: Diagnosis not present

## 2020-04-23 DIAGNOSIS — H04123 Dry eye syndrome of bilateral lacrimal glands: Secondary | ICD-10-CM | POA: Diagnosis not present

## 2020-04-23 DIAGNOSIS — H16223 Keratoconjunctivitis sicca, not specified as Sjogren's, bilateral: Secondary | ICD-10-CM | POA: Diagnosis not present

## 2020-04-25 ENCOUNTER — Telehealth: Payer: Self-pay | Admitting: Internal Medicine

## 2020-04-25 NOTE — Telephone Encounter (Signed)
New message:   Pt is calling and states she would like a return call to discuss a test. Please advise.

## 2020-04-25 NOTE — Telephone Encounter (Signed)
Called patient left message

## 2020-05-01 ENCOUNTER — Telehealth: Payer: Self-pay | Admitting: Internal Medicine

## 2020-05-01 ENCOUNTER — Emergency Department (HOSPITAL_COMMUNITY)
Admission: EM | Admit: 2020-05-01 | Discharge: 2020-05-01 | Disposition: A | Discharge status: Home / Self Care | Payer: No Typology Code available for payment source | Attending: Emergency Medicine | Admitting: Emergency Medicine

## 2020-05-01 ENCOUNTER — Other Ambulatory Visit: Payer: Self-pay

## 2020-05-01 DIAGNOSIS — Z87891 Personal history of nicotine dependence: Secondary | ICD-10-CM | POA: Insufficient documentation

## 2020-05-01 DIAGNOSIS — M5432 Sciatica, left side: Secondary | ICD-10-CM | POA: Diagnosis not present

## 2020-05-01 DIAGNOSIS — K5730 Diverticulosis of large intestine without perforation or abscess without bleeding: Secondary | ICD-10-CM | POA: Insufficient documentation

## 2020-05-01 DIAGNOSIS — Z96651 Presence of right artificial knee joint: Secondary | ICD-10-CM | POA: Insufficient documentation

## 2020-05-01 DIAGNOSIS — K5904 Chronic idiopathic constipation: Secondary | ICD-10-CM | POA: Insufficient documentation

## 2020-05-01 DIAGNOSIS — K219 Gastro-esophageal reflux disease without esophagitis: Secondary | ICD-10-CM | POA: Diagnosis not present

## 2020-05-01 DIAGNOSIS — Z79899 Other long term (current) drug therapy: Secondary | ICD-10-CM | POA: Insufficient documentation

## 2020-05-01 DIAGNOSIS — Z800 Family history of malignant neoplasm of digestive organs: Secondary | ICD-10-CM | POA: Diagnosis not present

## 2020-05-01 DIAGNOSIS — I10 Essential (primary) hypertension: Secondary | ICD-10-CM | POA: Diagnosis not present

## 2020-05-01 DIAGNOSIS — M25552 Pain in left hip: Secondary | ICD-10-CM | POA: Diagnosis present

## 2020-05-01 MED ORDER — PREDNISONE 50 MG PO TABS: ORAL_TABLET | ORAL | 0 refills | Status: DC

## 2020-05-01 NOTE — Telephone Encounter (Signed)
New message:   Pt is calling and states she would like to check with Dr. Sharlet Salina first before taking the medication given to her by the ER. She states one is predniSONE (DELTASONE) 50 MG tablet and the other is doxicapine. Please advise.

## 2020-05-01 NOTE — Discharge Instructions (Signed)
Call your doctor to schedule a follow-up visit for next week

## 2020-05-01 NOTE — ED Provider Notes (Signed)
[facility] DEPT Provider Note   CSN: [id number] Arrival date & time: 05/01/20  [id number]     History Chief Complaint  Patient presents with  . Multiple Complaints    Gabrielle [name] is a 69 y.o. female.  69 year old female with history of anxiety who presents with chronic pain to her right foot, left hip, left knee.  States that this has been going on for about 2 months but worse over the last week or so.  Denies any direct trauma.  No fever or chills.  Does have a history of right knee replacement and is supposed to be using a cane but does not use this.  States she has not been taking any medications for this.  Pain is worse with ambulation and better with rest.  States the pain in her left hip is mostly in her sciatic area with some radiation down her left leg.  No bowel or bladder dysfunction        Past Medical History:  Diagnosis Date  . Anemia    as a child  . Anxiety    takes Xanax daily  . Arthritis   . Cataracts, bilateral    immature  . Chronic insomnia 10/11/2014  . Complication of anesthesia   . Constipation    will occasionally take Milk of Mag  . Diverticulosis   . Dizziness    rarely  . GERD (gastroesophageal reflux disease)    takes Omeprazole every other day  . Glaucoma    borderline and no drops required  . Hearing loss    but doesn't have hearing aids  . History of bronchitis    many many yrs ago  . History of colitis   . History of colon polyps   . Hypertension    hasn't been on meds for the past 72yrs   . Insomnia    takes Trazodone nightly as needed and Ambien nightly   . 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 Joint pain   . Joint swelling   . PONV (postoperative nausea and vomiting)   . Tinnitus    takes HCTZ daily to decrease pressure in ears    Patient Active Problem List   Diagnosis Date Noted  . Elevated blood pressure reading in office without diagnosis of hypertension 12/15/2018  . Blindness  09/14/2018  . GERD (gastroesophageal reflux disease) 04/21/2018  . Chronic post-traumatic stress disorder (PTSD) 03/14/2018  . Tinnitus 03/14/2018  . Routine general medical examination at a health care facility 03/14/2018  . Abnormality of gait 06/03/2015  . S/P TKR (total knee replacement) 06/03/2015  . Chronic left SI joint pain 06/03/2015  . Chronic insomnia 10/11/2014  . DJD (degenerative joint disease) of knee 03/13/2014    Past Surgical History:  Procedure Laterality Date  . ABDOMINAL HYSTERECTOMY    . APPENDECTOMY    . COLONOSCOPY    . IR RADIOLOGIST EVAL & MGMT  06/08/2017  . tinnitus Right   . TONSILLECTOMY    . TOTAL KNEE ARTHROPLASTY Right 03/13/2014   DR MURPHY  . TOTAL KNEE ARTHROPLASTY Right 03/13/2014   Procedure: TOTAL KNEE ARTHROPLASTY;  Surgeon: [name], MD;  Location: [facility];  Service: Orthopedics;  Laterality: Right;  . TUBAL LIGATION       OB History   No obstetric history on file.     Family History  Problem Relation Age of Onset  . Cancer - Other Father   . Kidney disease Father   .  Cancer - Other Sister        breast ca  . Cancer - Other Brother        lymphoma in remission  . Cancer - Other Sister        in remission breast ca    Social History   Tobacco Use  . Smoking status: Former Smoker    Packs/day: 1.00    Years: 22.00    Pack years: 22.00    Types: Cigarettes    Start date: 03/08/1972    Quit date: 01/08/1994    Years since quitting: 26.3  . Smokeless tobacco: Never Used  . Tobacco comment: quit smoking 81yrs ago  Substance Use Topics  . Alcohol use: No    Alcohol/week: 0.0 standard drinks  . Drug use: No    Home Medications Prior to Admission medications   Medication Sig Start Date End Date Taking? Authorizing Provider  acetaminophen (TYLENOL) 500 MG tablet Take 500 mg by mouth every 6 (six) hours as needed.    [provider]  ALPRAZolam Duanne Moron) 1 MG tablet Take 1 tablet (1 mg total) by mouth 3 (three)  times daily. 04/08/20   [name], MD  carboxymethylcellulose (REFRESH PLUS) 0.5 % SOLN 1 drop 3 (three) times daily as needed. Uses kind with no preservatives Only can see in R eye    [provider]  cholecalciferol (VITAMIN D) 1000 UNITS tablet Take 2,000 Units by mouth daily. Reported on 06/01/2016    [provider]  Multiple Vitamins-Minerals (MULTIVITAMIN WITH MINERALS) tablet Take 1 tablet by mouth daily.    [provider]  omeprazole (PRILOSEC) 40 MG capsule TAKE 1 CAPSULE BY MOUTH EVERY DAY 10/26/19   [name], MD  OPTIVE 0.5-0.9 % ophthalmic solution 1 drop Both Eyes 03/07/20   [provider]  polyethylene glycol (MIRALAX) packet Take 17 g by mouth daily as needed.     [provider]  sucralfate (CARAFATE) 1 GM/10ML suspension Take 10 mLs (1 g total) by mouth 4 (four) times daily -  with meals and at bedtime. Patient taking differently: Take 1 g by mouth as needed.  10/13/18   [name], MD  zolpidem (AMBIEN) 10 MG tablet TAKE 1 TO 1 & 1/2 TABLET AT BEDTIME AS NEEDED FOR SLEEP 04/08/20   [name], MD    Allergies    Shellfish allergy, Gadolinium derivatives, Contrast media [iodinated diagnostic agents], Hydrocodone, Metronidazole, Acetazolamide, Gabapentin, and Mirtazapine  Review of Systems   Review of Systems  All other systems reviewed and are negative.   Physical Exam Updated Vital Signs BP (!) 150/78 (BP Location: Right Arm)   Pulse 65   Temp 98.4 F (36.9 C) (Oral)   Resp 18   SpO2 100%   Physical Exam Vitals and nursing note reviewed.  Constitutional:      General: She is not in acute distress.    Appearance: Normal appearance. She is well-developed. She is not toxic-appearing.  HENT:     Head: Normocephalic and atraumatic.  Eyes:     General: Lids are normal.     Conjunctiva/sclera: Conjunctivae normal.     Pupils: Pupils are equal, round, and reactive to light.   Neck:     Thyroid: No thyroid mass.     Trachea: No tracheal deviation.  Cardiovascular:     Rate and Rhythm: Normal rate and regular rhythm.     Heart sounds: Normal heart sounds. No murmur. No gallop.  Pulmonary:     Effort: Pulmonary effort is normal. No respiratory distress.     Breath sounds: Normal breath sounds. No stridor. No decreased breath sounds, wheezing, rhonchi or rales.  Abdominal:     General: Bowel sounds are normal. There is no distension.     Palpations: Abdomen is soft.     Tenderness: There is no abdominal tenderness. There is no rebound.  Musculoskeletal:        General: No tenderness. Normal range of motion.     Cervical back: Normal range of motion and neck supple.     Left knee: No bony tenderness. Normal range of motion. No tenderness.       Legs:     Comments: Right foot without swelling or erythema.  Neurovascular intact at right foot.  Tender at left sciatic notch.  No shortening or rotation of the left lower extremity.  Neurovascular intact at left foot.  Skin:    General: Skin is warm and dry.     Findings: No abrasion or rash.  Neurological:     Mental Status: She is alert and oriented to person, place, and time.     GCS: GCS eye subscore is 4. GCS verbal subscore is 5. GCS motor subscore is 6.     Cranial Nerves: No cranial nerve deficit.     Sensory: No sensory deficit.  Psychiatric:        Speech: Speech normal.        Behavior: Behavior normal.     ED Results / Procedures / Treatments   Labs (all labs ordered are listed, but only abnormal results are displayed) Labs Reviewed - No data to display  EKG None  Radiology No results found.  Procedures Procedures (including critical care time)  Medications Ordered in ED Medications - No data to display  ED Course  I have reviewed the triage vital signs and the nursing notes.  Pertinent labs & imaging results that were available during my care of the patient were reviewed by me and  considered in my medical decision making (see chart for details).    MDM Rules/Calculators/A&P                      Patient with likely sciatica on the left side as well as some chronic arthritis pain.  Patient does not want any opiates as she is concerned about possible interactions with her benzodiazepines that she takes for her anxiety and sleep.  Will prescribe short course of prednisone patient encouraged to follow-up with her doctor Final Clinical Impression(s) / ED Diagnoses Final diagnoses:  None    Rx / DC Orders ED Discharge Orders    None       Lacretia Leigh, MD 05/01/20 [phone]

## 2020-05-01 NOTE — Telephone Encounter (Signed)
New message:   Pt is stating she is having to go to the ER due to her hip and her foot. She states she is driving herself.

## 2020-05-01 NOTE — Telephone Encounter (Signed)
Does she have a specific question about the medications or does she just want to make a visit to discuss?

## 2020-05-01 NOTE — Telephone Encounter (Signed)
See message.

## 2020-05-01 NOTE — ED Triage Notes (Signed)
Pt arrives POV from home. Pt reports left hip pain, left knee pain, right foot pain ongoing for 2 months. Pt denies injury or trauma.

## 2020-05-02 ENCOUNTER — Telehealth: Payer: Self-pay

## 2020-05-02 NOTE — Telephone Encounter (Signed)
New message:   Pt is calling and states her specific question that she is asking is it okay to take this medication along with her other medication. Please advise.

## 2020-05-02 NOTE — Telephone Encounter (Signed)
New message    The patient calling asking can Dr. Sharlet Salina take her grandson on as a new patient   Gabrielle [name] [id number] 10-15-1991 the patient is at [facility] now.   H/o Dm diagnosis when he was graduating from high school.

## 2020-05-02 NOTE — Telephone Encounter (Signed)
See message.

## 2020-05-02 NOTE — Telephone Encounter (Signed)
This note would have to go in the patient's chart who is requesting to become a patient and not in this patient's chart.

## 2020-05-05 NOTE — Telephone Encounter (Signed)
Spoke with patient, told her it was okay to take prednisone with her other medications. Patient has an video visit  appointment on 05-07-2020 to discuss other issues

## 2020-05-05 NOTE — Telephone Encounter (Signed)
I put the notes below in [facility] chart  10/15/1991 as requested

## 2020-05-07 ENCOUNTER — Encounter: Payer: Self-pay | Admitting: Internal Medicine

## 2020-05-07 ENCOUNTER — Telehealth (INDEPENDENT_AMBULATORY_CARE_PROVIDER_SITE_OTHER): Payer: PPO | Admitting: Internal Medicine

## 2020-05-07 ENCOUNTER — Other Ambulatory Visit: Payer: Self-pay | Admitting: Internal Medicine

## 2020-05-07 DIAGNOSIS — K219 Gastro-esophageal reflux disease without esophagitis: Secondary | ICD-10-CM | POA: Diagnosis not present

## 2020-05-07 DIAGNOSIS — F5104 Psychophysiologic insomnia: Secondary | ICD-10-CM

## 2020-05-07 DIAGNOSIS — F4312 Post-traumatic stress disorder, chronic: Secondary | ICD-10-CM | POA: Diagnosis not present

## 2020-05-07 NOTE — Progress Notes (Signed)
Virtual Visit via Video Note  I connected with Gabrielle [name] on 05/07/20 at 10:20 AM EDT by a video enabled telemedicine application and verified that I am speaking with the correct person using two identifiers.  The patient and the provider were at separate locations throughout the entire encounter. Patient: at home, provider: at work   I discussed the limitations of evaluation and management by telemedicine and the availability of in person appointments. The patient expressed understanding and agreed to proceed. The patient and the provider were the only parties present for the visit unless noted in HPI below.  History of Present Illness: The patient is a 69 y.o. female with visit for ER follow up. Started about 5-6 days ago with left hip pain. She was given prednisone pack and she did take this. She took that and yesterday she started having heart palpitations and dizziness. She did not pass out. She did get advised by after hours to go to ER but she called and the wait was very long so she did not go. She then took a half xanax to help her calm down and this did help along with breathing exercises. Has some heartburn. She did have doxepin which she got from a GI doctor recently. She wanted to know if we thought she should take that or try that. Denies current symptoms except increased anxiety.   Observations/Objective: Appearance: normal, breathing appears normal, casual grooming, abdomen does not appear distended, throat normal, memory normal, mental status is A and O times 3, does appear some anxious  Assessment and Plan: See problem oriented charting  Follow Up Instructions: steroids could still be in her system for another week, referral to psych as she wishes to wean off xanax or switch to something else and has tried and failed many other agents  I discussed the assessment and treatment plan with the patient. The patient was provided an opportunity to ask questions and all were answered.  The patient agreed with the plan and demonstrated an understanding of the instructions.   The patient was advised to call back or seek an in-person evaluation if the symptoms worsen or if the condition fails to improve as anticipated.  [name], MD

## 2020-05-08 NOTE — Assessment & Plan Note (Signed)
Advised that likely it is not harmful to try doxepin but I would wait 1-2 weeks until steroids out of her system so she can properly assess side effects and efficacy.

## 2020-05-08 NOTE — Assessment & Plan Note (Signed)
Needs referral to local psych for management as the [facility] has been unable to find anyone in the last 1-2 years to help her consistently.

## 2020-05-08 NOTE — Assessment & Plan Note (Signed)
Advised that xanax does have a refill left on it and she should check with her pharmacy when needed. She is also taking ambien for sleep and her goal is to get off this long term and she is currently pursing alternate treatment options for her severe tinnitus which is a large factor in her poor sleep currently.

## 2020-05-12 DIAGNOSIS — S0501XA Injury of conjunctiva and corneal abrasion without foreign body, right eye, initial encounter: Secondary | ICD-10-CM | POA: Diagnosis not present

## 2020-05-13 ENCOUNTER — Telehealth: Payer: Self-pay

## 2020-05-13 NOTE — Telephone Encounter (Signed)
New message   Virtual Appt on 5.12.21  The patient voiced checking on the status of the therapist name Dr. Sharlet Salina recommend during her visit.   Asking for a call back

## 2020-05-14 ENCOUNTER — Ambulatory Visit: Payer: PPO | Admitting: Orthopedic Surgery

## 2020-05-15 ENCOUNTER — Other Ambulatory Visit: Payer: Self-pay

## 2020-05-15 ENCOUNTER — Telehealth: Payer: Self-pay | Admitting: Neurology

## 2020-05-15 DIAGNOSIS — R519 Headache, unspecified: Secondary | ICD-10-CM | POA: Diagnosis not present

## 2020-05-15 DIAGNOSIS — H9313 Tinnitus, bilateral: Secondary | ICD-10-CM | POA: Diagnosis not present

## 2020-05-15 DIAGNOSIS — H903 Sensorineural hearing loss, bilateral: Secondary | ICD-10-CM | POA: Diagnosis not present

## 2020-05-15 DIAGNOSIS — H93A3 Pulsatile tinnitus, bilateral: Secondary | ICD-10-CM | POA: Diagnosis not present

## 2020-05-15 DIAGNOSIS — H5462 Unqualified visual loss, left eye, normal vision right eye: Secondary | ICD-10-CM | POA: Diagnosis not present

## 2020-05-15 NOTE — Telephone Encounter (Signed)
Schedule LP for opening CSF pressure, as well as checking for CSF cell count with diff, protein, glucose, gram stain and culture, IgG index, oligoclonal bands, ACE and flow cytometry.  She will need to make follow up appointment.

## 2020-05-15 NOTE — Telephone Encounter (Signed)
Called lvm referral has been sent to [facility] office will be in contact with appointment

## 2020-05-15 NOTE — Telephone Encounter (Signed)
Spoke to Angie at DR. Thornell Mule office pt needs to have a referral for a Lumbar Puncture. Please have DR. Tomi Likens order it for the pt and call the pt once it has been ordered.

## 2020-05-15 NOTE — Telephone Encounter (Signed)
LMOVM order added to [facility]. Call if she has any questions.

## 2020-05-15 NOTE — Telephone Encounter (Signed)
Left message with the after hour service on 05-15-20 @ 12:01   Caller states she is Angie with Dr Thornell Mule office at [facility] needsing the Dr to see patient for a LP patient is already a patient of Dr Serena Croissant states that Dr Tomi Likens office needs to call patient to set it up    Patient phone number is [phone]

## 2020-05-21 ENCOUNTER — Telehealth: Payer: Self-pay | Admitting: Internal Medicine

## 2020-05-21 NOTE — Telephone Encounter (Signed)
New message:    Pt is calling and states she is supposed to be sent to a therapist and she is checking on the status of that. Please advise.

## 2020-05-21 NOTE — Telephone Encounter (Signed)
Spoke with patient told her referral was in system, [facility] will call to schedule appointment

## 2020-05-21 NOTE — Progress Notes (Signed)
Primary Physician/Referring:  [name], MD  Patient ID: Gabrielle [name], female    DOB: [birth year], 69 y.o.   MRN: [id number]  Chief Complaint  Patient presents with  . Hypertension  . New Patient (Initial Visit)    Wanted my opinion on strange noises in her brain.   HPI:    Gabrielle [name]  is a 69 y.o. female patient with hypertension, chronic tinnitus, multiple medication intolerances, posttraumatic stress disorder, who I had last seen and evaluated in 2017 for palpitations and at that time I had added diltiazem CD for palpitations and symptoms had resolved.  I had also tried acetazolamide in Dec 2017 which she stopped in Jan 2018 due to improvement in tinnitus.  He now tells me that because she felt well with tinnitus, she had continued Diamox until she developed thrombocytopenia asymptomatic and since discontinuing the medication, the counts improved.  Recently she has developed frequent episodes of "digging in her brain" and has been evaluated by neurology and ENT and has been recommended spinal tap, she has been reluctant as she has had complications from medications or procedures most times.  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  She made an appointment to see me to get my opinion regarding how she has to proceed.  She denies any cardiac symptoms, states that her blood pressure is being managed by Dr. Jeneen Rinks Deterding.   Past Medical History:  Diagnosis Date  . Anemia    as a child  . Anxiety    takes Xanax daily  . Arthritis   . Cataracts, bilateral    immature  . Chronic insomnia 10/11/2014  . Complication of anesthesia   . Constipation    will occasionally take Milk of Mag  . Diverticulosis   . Dizziness    rarely  . GERD (gastroesophageal reflux disease)    takes Omeprazole every other day  . Glaucoma    borderline and no drops required  .  Hearing loss    but doesn't have hearing aids  . History of bronchitis    many many yrs ago  . History of colitis   . History of colon polyps   . Hypertension    hasn't been on meds for the past 20yrs   . Insomnia    takes Trazodone nightly as needed and Ambien nightly   . 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 Joint pain   . Joint swelling   . PONV (postoperative nausea and vomiting)   . Tinnitus    takes HCTZ daily to decrease pressure in ears   Past Surgical History:  Procedure Laterality Date  . ABDOMINAL HYSTERECTOMY    . APPENDECTOMY    . COLONOSCOPY    . IR RADIOLOGIST EVAL & MGMT  06/08/2017  . tinnitus Right   . TONSILLECTOMY    . TOTAL KNEE ARTHROPLASTY Right 03/13/2014   DR MURPHY  . TOTAL KNEE ARTHROPLASTY Right 03/13/2014   Procedure: TOTAL KNEE ARTHROPLASTY;  Surgeon: [name], MD;  Location: [facility];  Service: Orthopedics;  Laterality: Right;  . TUBAL LIGATION     Family History  Problem Relation Age of Onset  . Cancer - Other Father   . Kidney disease Father   . Cancer - Other Sister        breast ca  . Cancer - Other Brother  lymphoma in remission  . Lymphoma Brother   . Cancer - Other Sister        in remission breast ca    Social History   Tobacco Use  . Smoking status: Former Smoker    Packs/day: 1.00    Years: 22.00    Pack years: 22.00    Types: Cigarettes    Start date: 03/08/1972    Quit date: 01/08/1994    Years since quitting: 26.3  . Smokeless tobacco: Never Used  . Tobacco comment: quit smoking 30yrs ago  Substance Use Topics  . Alcohol use: No    Alcohol/week: 0.0 standard drinks   Marital Status: Divorced  ROS  Review of Systems  Cardiovascular: Negative for chest pain, dyspnea on exertion, leg swelling, palpitations and syncope.  Musculoskeletal: Positive for back pain.   Objective  There were no vitals taken for this visit.  Vitals with BMI 05/01/2020 05/01/2020 04/14/2020  Height - - 5\' 4"   Weight - - 217 lbs 8  oz  BMI - - [id number]  Systolic [id number] -  Diastolic 68 78 -  Pulse 69 65 -  Some encounter information is confidential and restricted. Go to Review Flowsheets activity to see all data.     Physical Exam  Constitutional: No distress.  Neck: No JVD present.  Cardiovascular: Normal rate, regular rhythm, normal heart sounds and intact distal pulses.  No murmur heard. Pulmonary/Chest: Effort normal and breath sounds normal. She has no wheezes. She has no rales.  Musculoskeletal:        General: No edema.  Nursing note and vitals reviewed.  Laboratory examination:   Recent Labs    03/14/20 1406  CREATININE 1.00   CrCl cannot be calculated (Patient's most recent lab result is older than the maximum 21 days allowed.).  CMP Latest Ref Rng & Units 03/14/2020 05/14/2019 10/06/2018  Glucose 70 - 99 mg/dL - 81 95  BUN 6 - 23 mg/dL - 11 15  Creatinine 0.44 - 1.00 mg/dL 1.00 1.14 0.98  Sodium 135 - 145 mEq/L - 138 142  Potassium 3.5 - 5.1 mEq/L - 3.8 3.7  Chloride 96 - 112 mEq/L - 103 106  CO2 19 - 32 mEq/L - 27 32  Calcium 8.4 - 10.5 mg/dL - 9.0 9.0  Total Protein 6.0 - 8.3 g/dL - 7.0 6.6  Total Bilirubin 0.2 - 1.2 mg/dL - 0.4 0.3  Alkaline Phos 39 - 117 U/L - 84 82  AST 0 - 37 U/L - 13 20  ALT 0 - 35 U/L - 10 15   CBC Latest Ref Rng & Units 05/14/2019 10/06/2018 09/09/2018  WBC 4.0 - 10.5 K/uL 8.0 7.0 5.6  Hemoglobin 12.0 - 15.0 g/dL 13.8 13.7 13.8  Hematocrit 36.0 - 46.0 % 40.9 41.0 41.7  Platelets 150.0 - 400.0 K/uL 121.0(L) 110.0(L) 120(L)   Lipid Panel     Component Value Date/Time   CHOL 181 04/08/2020 1017   TRIG 119.0 04/08/2020 1017   HDL 45.20 04/08/2020 1017   CHOLHDL 4 04/08/2020 1017   VLDL 23.8 04/08/2020 1017   LDLCALC 112 (H) 04/08/2020 1017   LDLDIRECT 109.0 10/06/2018 1457   HEMOGLOBIN A1C Lab Results  Component Value Date   HGBA1C 5.1 10/06/2018   TSH No results for input(s): TSH in the last 8760 hours.   Medications and allergies   Allergies   Allergen Reactions  . Shellfish Allergy Hives, Other (See Comments) and Rash  . Gadolinium Derivatives Hives  . Contrast  Media [Iodinated Diagnostic Agents] Hives  . Hydrocodone Other (See Comments)    insomnia  . Metronidazole Nausea And Vomiting and Other (See Comments)  . Acetazolamide Anxiety    Makes head feel strange  . Gabapentin Anxiety    unknown  . Mirtazapine Other (See Comments)     Current Outpatient Medications  Medication Instructions  . acetaminophen (TYLENOL) 500 mg, Oral, Every 6 hours PRN  . ALPRAZolam (XANAX) 1 mg, Oral, 3 times daily  . carboxymethylcellulose (REFRESH PLUS) 0.5 % SOLN 1 drop, 3 times daily PRN, Uses kind with no preservativesOnly can see in R eye  . cholecalciferol (VITAMIN D) 1,000 Units, Oral, Daily  . doxepin (SINEQUAN) 10 mg, Oral, Daily  . ketotifen (ALAWAY) 0.025 % ophthalmic solution 1 drop, Both Eyes, 2 times daily  . Loteprednol Etabonate (EYSUVIS) 0.25 % SUSP 1 drop, Ophthalmic, 4 times daily, x2weeks  . Multiple Vitamins-Minerals (MULTIVITAMIN WITH MINERALS) tablet 1 tablet, Oral, Daily  . omeprazole (PRILOSEC) 40 MG capsule TAKE 1 CAPSULE BY MOUTH EVERY DAY  . polyethylene glycol (MIRALAX) 17 g, Oral, Daily PRN  . sucralfate (CARAFATE) 1 g, Oral, 3 times daily with meals & bedtime  . zolpidem (AMBIEN) 10 MG tablet TAKE 1 TO 1 & 1/2 TABLET AT BEDTIME AS NEEDED FOR SLEEP   Radiology:   No results found.  Cardiac Studies:   None  EKG  EKG 08/12/2018: Normal sinus rhythm at rate of 58 bpm, normal axis, no evidence of ischemia.  Normal EKG.    Assessment     ICD-10-CM   1. Tinnitus of both ears  H93.13   2. Primary hypertension  I10   3. Class 2 obesity due to excess calories without serious comorbidity with body mass index (BMI) of 37.0 to 37.9 in adult  E66.09    Z68.37     No orders of the defined types were placed in this encounter.   There are no discontinued medications.  Recommendations:   SAYANA WHITTLESEY  is  a 69 y.o. female patient with hypertension, chronic tinnitus, multiple medication intolerances, posttraumatic stress disorder, who I had last seen and evaluated in 2017 for palpitations and at that time I had added diltiazem CD for palpitations and symptoms had resolved.  I had also tried acetazolamide in Dec 2017 which she stopped in Jan 2018 due to improvement in tinnitus.  He now tells me that because she felt well with tinnitus, she had continued Diamox until she developed thrombocytopenia asymptomatic and since discontinuing the medication, the counts improved.  Recently she has developed frequent episodes of "clicking in her brain" and has been evaluated by neurology and ENT and has been recommended spinal tap, she has been reluctant as she has had complications from medications or procedures most times, she recently developed visual loss left eye and was eventually diagnosed with optic atrophy. She made an appointment to see me to get my opinion regarding how she has to proceed.   I advised her that as she states that her life has become miserable with constant clicking in her brain that she should proceed with lumbar puncture to evaluate further as recommended by her neurologist.  She is concerned about complications and is also worried whether she would lose her right eye.  She was specifically concerned about spinal fluid leak which I again reassured her it is probably low risk.  I will leave it to her to decide upon whether she should proceed but overall I do not see a  major contraindication. Weight loss would certainly reduce the risk.  Her blood pressure was elevated today but patient states that she is being closely monitored by Dr. Mauricia Area, nephrology and I will defer further management to him.  Adrian Prows, MD, [facility] 05/25/2020, 12:08 PM [facility] Pager: [phone] Office: [phone]  CC: Drs. Pricilla Holm, Sofie Rower Deterding and Metta Clines

## 2020-05-22 ENCOUNTER — Encounter: Payer: Self-pay | Admitting: Cardiology

## 2020-05-22 ENCOUNTER — Other Ambulatory Visit: Payer: Self-pay

## 2020-05-22 ENCOUNTER — Ambulatory Visit: Payer: PPO | Admitting: Cardiology

## 2020-05-22 VITALS — BP 150/84 | HR 75 | Ht 65.0 in | Wt 222.0 lb

## 2020-05-22 DIAGNOSIS — I10 Essential (primary) hypertension: Secondary | ICD-10-CM

## 2020-05-22 DIAGNOSIS — E6609 Other obesity due to excess calories: Secondary | ICD-10-CM | POA: Diagnosis not present

## 2020-05-22 DIAGNOSIS — H9313 Tinnitus, bilateral: Secondary | ICD-10-CM

## 2020-05-22 DIAGNOSIS — Z6837 Body mass index (BMI) 37.0-37.9, adult: Secondary | ICD-10-CM | POA: Diagnosis not present

## 2020-05-25 ENCOUNTER — Encounter: Payer: Self-pay | Admitting: Cardiology

## 2020-05-28 DIAGNOSIS — R8281 Pyuria: Secondary | ICD-10-CM | POA: Diagnosis not present

## 2020-05-28 DIAGNOSIS — H9319 Tinnitus, unspecified ear: Secondary | ICD-10-CM | POA: Diagnosis not present

## 2020-05-28 DIAGNOSIS — F411 Generalized anxiety disorder: Secondary | ICD-10-CM | POA: Diagnosis not present

## 2020-05-28 DIAGNOSIS — D696 Thrombocytopenia, unspecified: Secondary | ICD-10-CM | POA: Diagnosis not present

## 2020-05-28 DIAGNOSIS — Z0000 Encounter for general adult medical examination without abnormal findings: Secondary | ICD-10-CM | POA: Diagnosis not present

## 2020-05-28 DIAGNOSIS — I10 Essential (primary) hypertension: Secondary | ICD-10-CM | POA: Diagnosis not present

## 2020-05-28 DIAGNOSIS — E785 Hyperlipidemia, unspecified: Secondary | ICD-10-CM | POA: Diagnosis not present

## 2020-05-31 IMAGING — CT CT ORBITS W/O CM
3 of 6 series · 13 of 47 positions shown, 15 images · non-contrast
Comparison: CT temporal bone 02/20/2013, CT head 11/28/2012

CLINICAL DATA: Loss of vision H1X.0 ([id number])

S/p bilateral blepharoplasty
EXAM:
CT ORBITS WITHOUT CONTRAST
TECHNIQUE: Multidetector CT images were obtained using the standard protocol
without intravenous contrast.

[Series 3: orbits 2.0 hr40 3 st · axial · 0.34mm/px · z∈[-52,+32]mm · 8 of 50 slices shown, 10 images]
[im 4/50  brain]
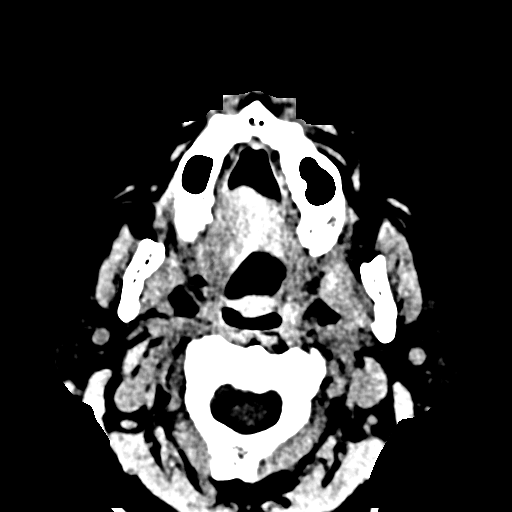
[im 4/50  bone]
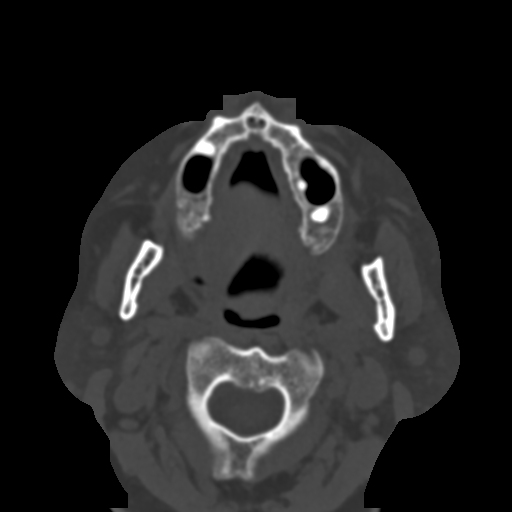
[im 11/50  bone]
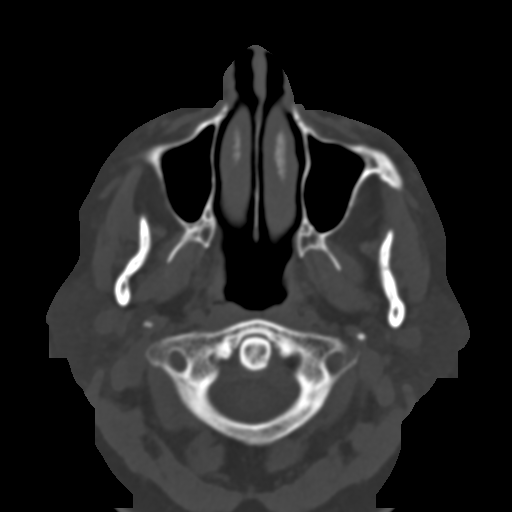
[im 18/50  bone]
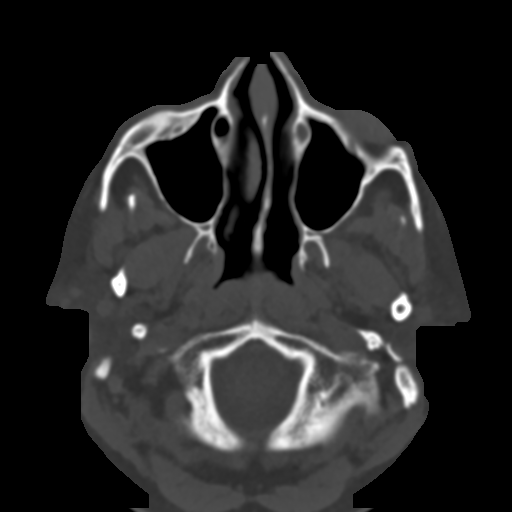
[im 22/50  bone]
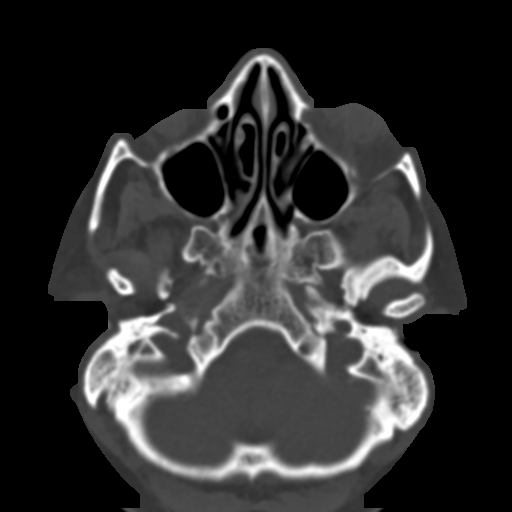
[im 29/50  brain]
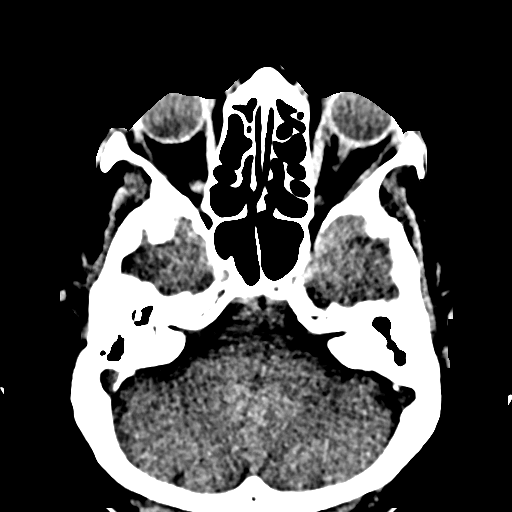
[im 29/50  bone]
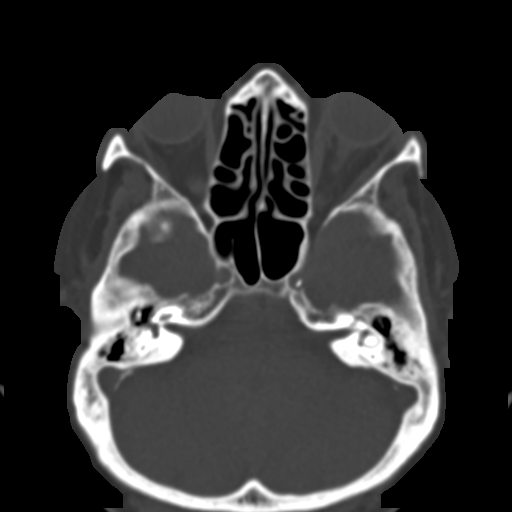
[im 32/50  bone]
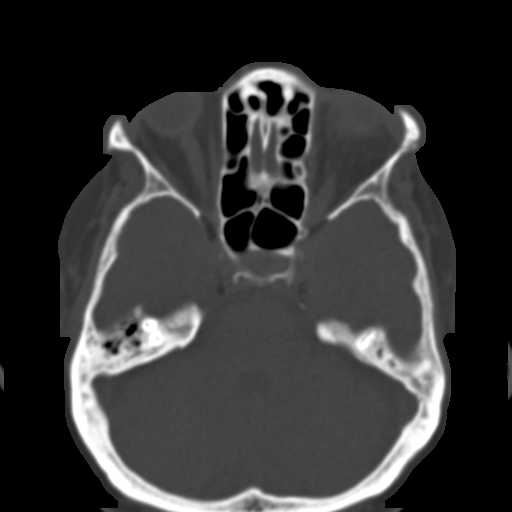
[im 39/50  bone]
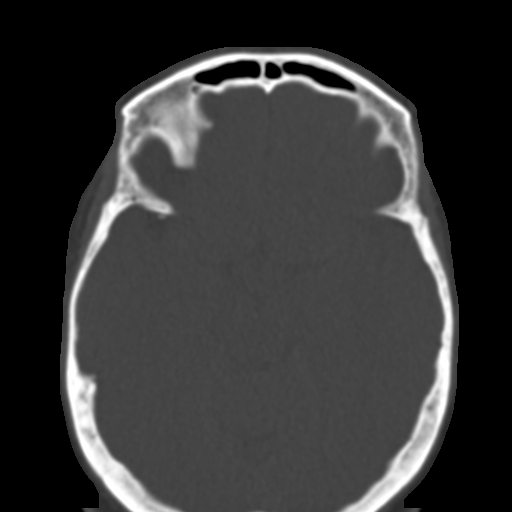
[im 46/50  bone]
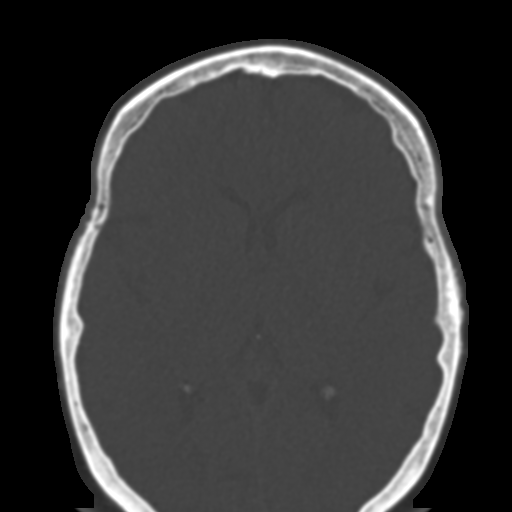

[Series 7: bone cor · coronal · 0.21mm/px · 3 of 78 slices shown]
[im 20/78  bone]
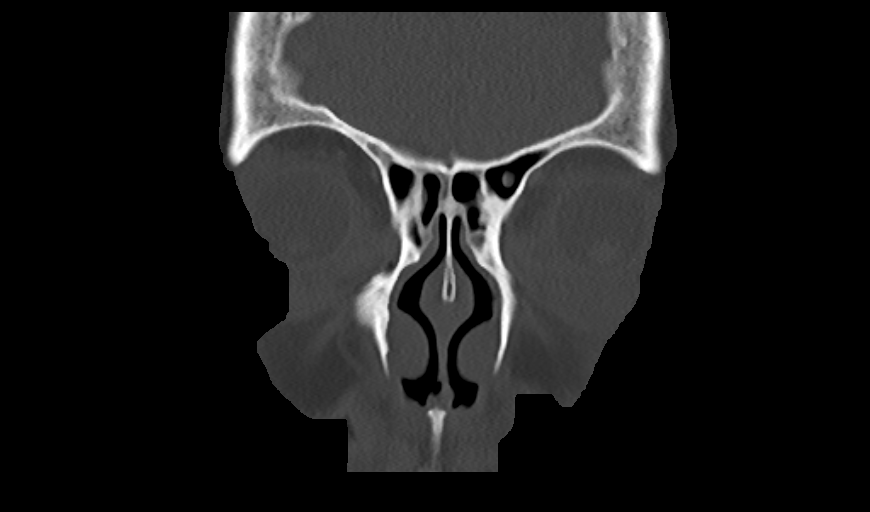
[im 39/78  bone]
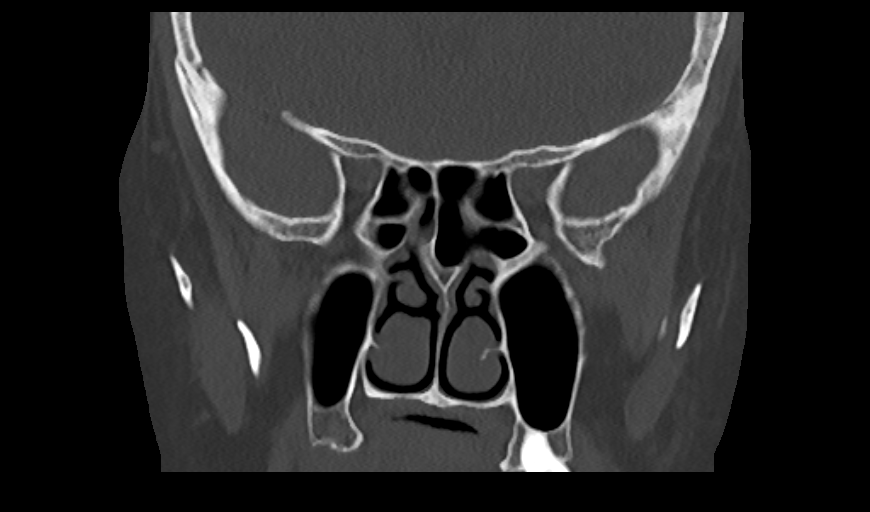
[im 58/78  bone]
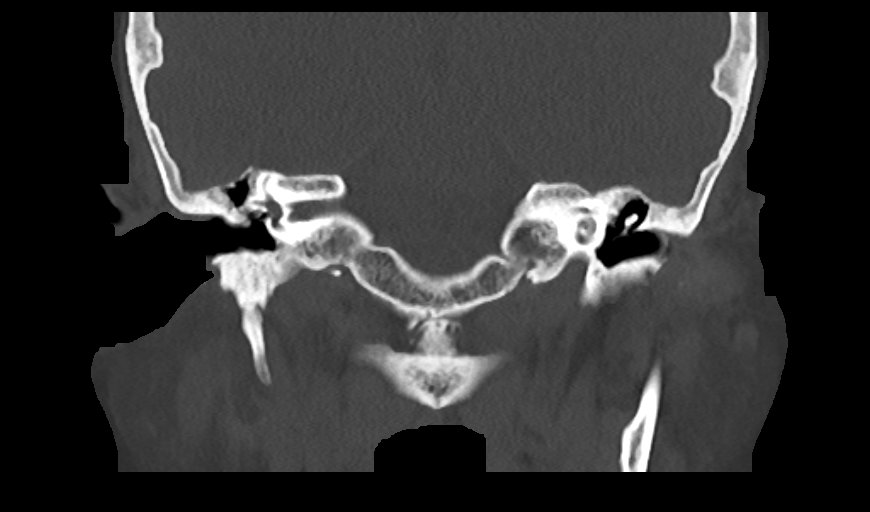

[Series 8: bone sag · sagittal · 0.21mm/px · 2 of 90 slices shown]
[im 30/90  bone]
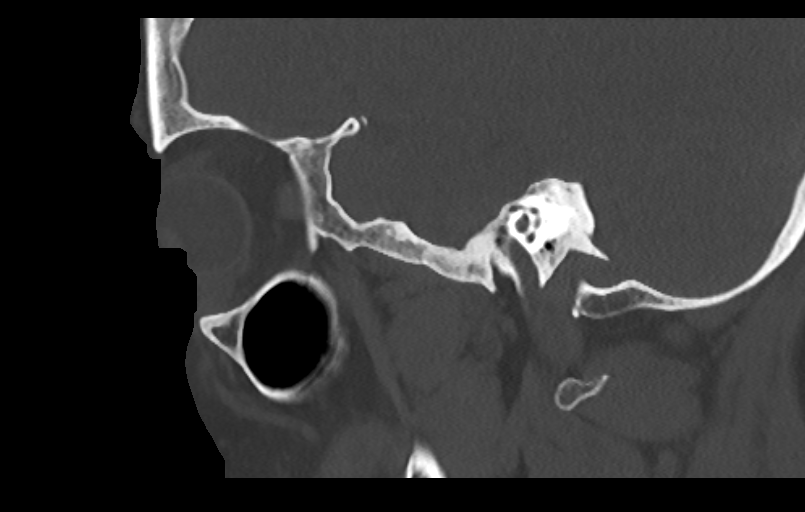
[im 60/90  bone]
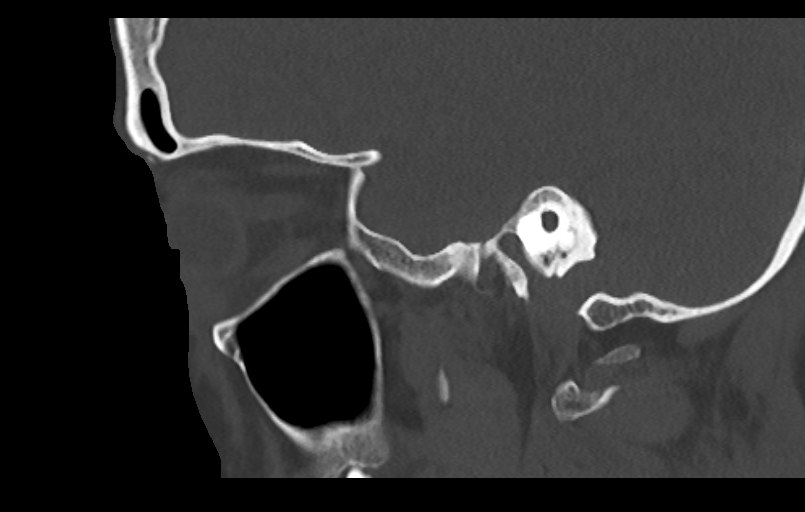

[13 of 47 positions shown; findings below may reference images not displayed]

FINDINGS: Orbits: Normal globe. Optic nerve and extraocular muscles normal. No
orbital hematoma

Soft tissue density in the left inferior eyelid shows increased
density compared to muscle. Density is homogeneous and relatively
well-circumscribed. This measures approximately 16 x 24 x 17 mm.
Given history of recent surgery, this may represent hematoma in the
soft tissues. There is mild extension inferior to the globe.
Negative for proptosis.

Visualized sinuses: Mild mucosal edema paranasal sinuses. No
air-fluid level

Chronic mastoiditis bilaterally. Improvement in small right mastoid
effusion since the prior CT. Middle ear well aerated

Limited intracranial: Negative
IMPRESSION: Soft tissue density inferior to the left globe compatible with
postop hematoma measuring 16 x 24 x 17 mm. No extension into the
orbit. No compression of the optic nerve.

Chronic mastoiditis bilaterally.

These results were called by telephone at the time of interpretation
on 07/06/2018 at [DATE] to Dr. CALEB AUJLA , who verbally
acknowledged these results.

## 2020-06-03 DIAGNOSIS — H47012 Ischemic optic neuropathy, left eye: Secondary | ICD-10-CM | POA: Diagnosis not present

## 2020-06-03 DIAGNOSIS — H0288B Meibomian gland dysfunction left eye, upper and lower eyelids: Secondary | ICD-10-CM | POA: Diagnosis not present

## 2020-06-03 DIAGNOSIS — Z9889 Other specified postprocedural states: Secondary | ICD-10-CM | POA: Diagnosis not present

## 2020-06-03 DIAGNOSIS — H0288A Meibomian gland dysfunction right eye, upper and lower eyelids: Secondary | ICD-10-CM | POA: Diagnosis not present

## 2020-06-03 DIAGNOSIS — H04123 Dry eye syndrome of bilateral lacrimal glands: Secondary | ICD-10-CM | POA: Diagnosis not present

## 2020-06-03 DIAGNOSIS — H16223 Keratoconjunctivitis sicca, not specified as Sjogren's, bilateral: Secondary | ICD-10-CM | POA: Diagnosis not present

## 2020-06-10 DIAGNOSIS — Z1231 Encounter for screening mammogram for malignant neoplasm of breast: Secondary | ICD-10-CM | POA: Diagnosis not present

## 2020-06-18 DIAGNOSIS — H0288B Meibomian gland dysfunction left eye, upper and lower eyelids: Secondary | ICD-10-CM | POA: Diagnosis not present

## 2020-06-18 DIAGNOSIS — H16223 Keratoconjunctivitis sicca, not specified as Sjogren's, bilateral: Secondary | ICD-10-CM | POA: Diagnosis not present

## 2020-06-18 DIAGNOSIS — H04123 Dry eye syndrome of bilateral lacrimal glands: Secondary | ICD-10-CM | POA: Diagnosis not present

## 2020-06-18 DIAGNOSIS — H0288A Meibomian gland dysfunction right eye, upper and lower eyelids: Secondary | ICD-10-CM | POA: Diagnosis not present

## 2020-07-15 DIAGNOSIS — H9113 Presbycusis, bilateral: Secondary | ICD-10-CM | POA: Diagnosis not present

## 2020-07-15 DIAGNOSIS — I10 Essential (primary) hypertension: Secondary | ICD-10-CM

## 2020-07-15 DIAGNOSIS — H6123 Impacted cerumen, bilateral: Secondary | ICD-10-CM | POA: Diagnosis not present

## 2020-07-21 ENCOUNTER — Encounter: Payer: Self-pay | Admitting: Internal Medicine

## 2020-07-21 ENCOUNTER — Telehealth (INDEPENDENT_AMBULATORY_CARE_PROVIDER_SITE_OTHER): Payer: PPO | Admitting: Internal Medicine

## 2020-07-21 DIAGNOSIS — F4312 Post-traumatic stress disorder, chronic: Secondary | ICD-10-CM

## 2020-07-21 DIAGNOSIS — J0110 Acute frontal sinusitis, unspecified: Secondary | ICD-10-CM

## 2020-07-21 MED ORDER — ALPRAZOLAM 1 MG PO TABS: 1.0000 mg | ORAL_TABLET | Freq: Three times a day (TID) | ORAL | 2 refills | Status: DC

## 2020-07-21 MED ORDER — HYDROCODONE-HOMATROPINE 5-1.5 MG/5ML PO SYRP: 5.0000 mL | ORAL_SOLUTION | Freq: Four times a day (QID) | ORAL | 0 refills | Status: DC | PRN

## 2020-07-21 MED ORDER — AMOXICILLIN-POT CLAVULANATE 875-125 MG PO TABS: 1.0000 | ORAL_TABLET | Freq: Two times a day (BID) | ORAL | 0 refills | Status: DC

## 2020-07-21 NOTE — Progress Notes (Signed)
Virtual Visit via Video Note  I connected with Gabrielle [name] on 07/21/20 at  2:40 PM EDT by a video enabled telemedicine application and verified that I am speaking with the correct person using two identifiers.  The patient and the provider were at separate locations throughout the entire encounter. Patient location: home, Provider location: work   I discussed the limitations of evaluation and management by telemedicine and the availability of in person appointments. The patient expressed understanding and agreed to proceed. The patient and the provider were the only parties present for the visit unless noted in HPI below.  History of Present Illness: The patient is a 69 y.o. female with visit for sinus problems. Started a couple of weeks or more ago with sinus pain and pressure. She is still having problems with her tinnitus and this is unchanged. Denies new ear pain or discharge. Is having nasal drainage and post nasal drip. She has cough which is worsening overall and some sore throat. She did seek care for similar in the fall and was given hydrocodone cough syrup. She has taken that 1-2 times for this and it is helpful for the cough and lets her sleep better. Has no fevers or chills. Denies muscle aches. Overall it is not improving. Has tried otc medications for allergies without relief  Observations/Objective: Appearance: normal, breathing appears normal, some cough during visit and voice hoarse, casual grooming, abdomen does not appear distended, throat not well visualized, memory normal, mental status is A and O times 3  Assessment and Plan: See problem oriented charting  Follow Up Instructions: rx augmentin and hycodan cough syrup  I discussed the assessment and treatment plan with the patient. The patient was provided an opportunity to ask questions and all were answered. The patient agreed with the plan and demonstrated an understanding of the instructions.   The patient was advised to  call back or seek an in-person evaluation if the symptoms worsen or if the condition fails to improve as anticipated.  [name], MD

## 2020-07-23 DIAGNOSIS — J019 Acute sinusitis, unspecified: Secondary | ICD-10-CM | POA: Insufficient documentation

## 2020-07-23 NOTE — Assessment & Plan Note (Signed)
Rx augmentin for sinus infection, advised to keep taking allergy medications. Rx hycodan for cough and [place] narcotic database reviewed and no inappropriate fills.

## 2020-08-01 DIAGNOSIS — H02822 Cysts of right lower eyelid: Secondary | ICD-10-CM | POA: Diagnosis not present

## 2020-08-01 DIAGNOSIS — H02115 Cicatricial ectropion of left lower eyelid: Secondary | ICD-10-CM | POA: Diagnosis not present

## 2020-08-01 DIAGNOSIS — Z9889 Other specified postprocedural states: Secondary | ICD-10-CM | POA: Diagnosis not present

## 2020-08-01 DIAGNOSIS — H02112 Cicatricial ectropion of right lower eyelid: Secondary | ICD-10-CM | POA: Diagnosis not present

## 2020-08-05 DIAGNOSIS — H47012 Ischemic optic neuropathy, left eye: Secondary | ICD-10-CM | POA: Diagnosis not present

## 2020-08-05 DIAGNOSIS — H0288B Meibomian gland dysfunction left eye, upper and lower eyelids: Secondary | ICD-10-CM | POA: Diagnosis not present

## 2020-08-05 DIAGNOSIS — Z961 Presence of intraocular lens: Secondary | ICD-10-CM | POA: Diagnosis not present

## 2020-08-05 DIAGNOSIS — Z9889 Other specified postprocedural states: Secondary | ICD-10-CM | POA: Diagnosis not present

## 2020-08-05 DIAGNOSIS — H40053 Ocular hypertension, bilateral: Secondary | ICD-10-CM | POA: Diagnosis not present

## 2020-08-05 DIAGNOSIS — H0288A Meibomian gland dysfunction right eye, upper and lower eyelids: Secondary | ICD-10-CM | POA: Diagnosis not present

## 2020-08-05 DIAGNOSIS — D3132 Benign neoplasm of left choroid: Secondary | ICD-10-CM | POA: Diagnosis not present

## 2020-08-05 DIAGNOSIS — H43811 Vitreous degeneration, right eye: Secondary | ICD-10-CM | POA: Diagnosis not present

## 2020-08-05 DIAGNOSIS — H11823 Conjunctivochalasis, bilateral: Secondary | ICD-10-CM | POA: Diagnosis not present

## 2020-08-05 DIAGNOSIS — H16223 Keratoconjunctivitis sicca, not specified as Sjogren's, bilateral: Secondary | ICD-10-CM | POA: Diagnosis not present

## 2020-08-07 ENCOUNTER — Telehealth: Payer: Self-pay | Admitting: Internal Medicine

## 2020-08-07 NOTE — Telephone Encounter (Signed)
    Santiago Glad from [employer] calling to report she spoke with patient today.  Patient was crying, verbalized feeling worried about her overall health, including anxiety associated with Covid19 social  Isolation.  Santiago Glad [phone]

## 2020-08-14 ENCOUNTER — Encounter: Payer: Self-pay | Admitting: Psychiatry

## 2020-08-14 ENCOUNTER — Telehealth (INDEPENDENT_AMBULATORY_CARE_PROVIDER_SITE_OTHER): Payer: PPO | Admitting: Psychiatry

## 2020-08-14 ENCOUNTER — Other Ambulatory Visit: Payer: Self-pay

## 2020-08-14 DIAGNOSIS — I10 Essential (primary) hypertension: Secondary | ICD-10-CM | POA: Insufficient documentation

## 2020-08-14 DIAGNOSIS — G4700 Insomnia, unspecified: Secondary | ICD-10-CM | POA: Diagnosis not present

## 2020-08-14 DIAGNOSIS — F411 Generalized anxiety disorder: Secondary | ICD-10-CM | POA: Diagnosis not present

## 2020-08-14 DIAGNOSIS — F4312 Post-traumatic stress disorder, chronic: Secondary | ICD-10-CM | POA: Diagnosis not present

## 2020-08-14 DIAGNOSIS — F32A Depression, unspecified: Secondary | ICD-10-CM | POA: Insufficient documentation

## 2020-08-14 DIAGNOSIS — F329 Major depressive disorder, single episode, unspecified: Secondary | ICD-10-CM | POA: Diagnosis not present

## 2020-08-14 DIAGNOSIS — H47292 Other optic atrophy, left eye: Secondary | ICD-10-CM | POA: Insufficient documentation

## 2020-08-14 DIAGNOSIS — H9190 Unspecified hearing loss, unspecified ear: Secondary | ICD-10-CM | POA: Insufficient documentation

## 2020-08-14 DIAGNOSIS — F419 Anxiety disorder, unspecified: Secondary | ICD-10-CM | POA: Insufficient documentation
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
## 2020-08-18 NOTE — Progress Notes (Signed)
Subjective:    Patient ID: Gabrielle [name], female    DOB: Aug 23, 1951, 69 y.o.   MRN: [id number]  HPI The patient is here for an acute visit.  She is here for flank pain on the left.  Started 2 days ago.  It was initially mild and was worse yesterday morning when she woke up.  During the day yesterday her pain level was 5-6/10, but slowly decreased to 2-3/10.  She did have decreased appetite.  Yesterday she was able to have 3-4 bowel movements and the pain improved after that.   Today is is pain free.     Has GERD.  Has taken multiple meds.  She takes Carafate prn.     Medications and allergies reviewed with patient and updated if appropriate.  Patient Active Problem List   Diagnosis Date Noted   Hearing loss 08/14/2020   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   Essential (primary) hypertension 08/14/2020   Depression 08/14/2020   GAD (generalized anxiety disorder) 08/14/2020   Acute sinusitis 07/23/2020   Morbid obesity ([place]) 05/01/2020   Family history of colon cancer 05/01/2020   Diverticular disease of colon 05/01/2020   Chronic idiopathic constipation 05/01/2020   Decreased vision of left eye 02/28/2020   Elevated blood pressure reading in office without diagnosis of hypertension 12/15/2018   Blindness 09/14/2018   GERD (gastroesophageal reflux disease) 04/21/2018   TMJ pain dysfunction syndrome 03/23/2018   Pulsatile tinnitus of both ears 03/23/2018   Chronic post-traumatic stress disorder (PTSD) 03/14/2018   Tinnitus 03/14/2018   Routine general medical examination at a health care facility 03/14/2018   Left ear pain 05/13/2017   Presbycusis of both ears 03/30/2017   Bilateral impacted cerumen 11/30/2016   Abnormal auditory perception of both ears 06/03/2016   Neck pain 03/12/2016   Abnormality of gait 06/03/2015   S/P TKR (total knee replacement) 06/03/2015   Chronic left SI joint pain 06/03/2015   Insomnia 10/11/2014    Drug-induced sleep disorder ([place]) 10/11/2014   DJD (degenerative joint disease) of knee 03/13/2014   Sensorineural hearing loss, bilateral 11/27/2013    Current Outpatient Medications on File Prior to Visit  Medication Sig Dispense Refill   acetaminophen (TYLENOL) 500 MG tablet Take 500 mg by mouth every 6 (six) hours as needed.      ALPRAZolam (XANAX) 1 MG tablet Take 1 tablet (1 mg total) by mouth 3 (three) times daily. (Patient taking differently: Take 1 mg by mouth 3 (three) times daily. 1 mg bid -takes differently) 90 tablet 2   amoxicillin-clavulanate (AUGMENTIN) 875-125 MG tablet Take 1 tablet by mouth 2 (two) times daily. 14 tablet 0   carboxymethylcellulose (REFRESH PLUS) 0.5 % SOLN 1 drop 3 (three) times daily as needed. Uses kind with no preservatives Only can see in R eye     cholecalciferol (VITAMIN D) 1000 UNITS tablet Take 1,000 Units by mouth daily.      doxepin (SINEQUAN) 10 MG capsule Take 10 mg by mouth daily.      HYDROcodone-homatropine (HYCODAN) 5-1.5 MG/5ML syrup Take 5 mLs by mouth every 6 (six) hours as needed for cough. 100 mL 0   ketotifen (ALAWAY) 0.025 % ophthalmic solution Place 1 drop into both eyes 2 (two) times daily.     Loteprednol Etabonate (EYSUVIS) 0.25 % SUSP Apply 1 drop to eye in the morning, at noon, in the evening, and at bedtime. x2weeks      Multiple Vitamins-Minerals (MULTIVITAMIN WITH MINERALS) tablet Take  1 tablet by mouth daily.     omeprazole (PRILOSEC) 40 MG capsule TAKE 1 CAPSULE BY MOUTH EVERY DAY 30 capsule 5   polyethylene glycol (MIRALAX) packet Take 17 g by mouth daily as needed.      sucralfate (CARAFATE) 1 GM/10ML suspension Take 10 mLs (1 g total) by mouth 4 (four) times daily -  with meals and at bedtime. 420 mL 0   zolpidem (AMBIEN) 10 MG tablet TAKE 1 TO 1 & 1/2 TABLET AT BEDTIME AS NEEDED FOR SLEEP (Patient taking differently: Take 10-15 mg by mouth at bedtime as needed for sleep. Daily at bedtime) 30 tablet 4   No  current facility-administered medications on file prior to visit.    Past Medical History:  Diagnosis Date   Anemia    as a child   Anxiety    takes Xanax daily   Arthritis    Cataracts, bilateral    immature   Chronic insomnia 85/63/1497   Complication of anesthesia    Constipation    will occasionally take Milk of Mag   Diverticulosis    Dizziness    rarely   GERD (gastroesophageal reflux disease)    takes Omeprazole every other day   Glaucoma    borderline and no drops required   Hearing loss    but doesn't have hearing aids   History of bronchitis    many many yrs ago   History of colitis    History of colon polyps    Hypertension    hasn't been on meds for the past 30yrs    Insomnia    takes Trazodone nightly as needed and Ambien nightly    Insomnia due to substance 10/11/2014   Joint pain    Joint swelling    PONV (postoperative nausea and vomiting)    Tinnitus    takes HCTZ daily to decrease pressure in ears    Past Surgical History:  Procedure Laterality Date   ABDOMINAL HYSTERECTOMY     APPENDECTOMY     COLONOSCOPY     IR RADIOLOGIST EVAL & MGMT  06/08/2017   tinnitus Right    TONSILLECTOMY     TOTAL KNEE ARTHROPLASTY Right 03/13/2014   DR MURPHY   TOTAL KNEE ARTHROPLASTY Right 03/13/2014   Procedure: TOTAL KNEE ARTHROPLASTY;  Surgeon: [name], MD;  Location: [facility];  Service: Orthopedics;  Laterality: Right;   TUBAL LIGATION      Social History   Socioeconomic History   Marital status: Divorced    Spouse name: Not on file   Number of children: 2   Years of education: Not on file   Highest education level: Not on file  Occupational History   Occupation: retired  Tobacco Use   Smoking status: Former Smoker    Packs/day: 1.00    Years: 22.00    Pack years: 22.00    Types: Cigarettes    Start date: 03/08/1972    Quit date: 01/08/1994    Years since quitting: 26.6   Smokeless tobacco: Never Used    Tobacco comment: quit smoking 61yrs ago  Vaping Use   Vaping Use: Never used  Substance and Sexual Activity   Alcohol use: No    Alcohol/week: 0.0 standard drinks   Drug use: No   Sexual activity: Not Currently    Birth control/protection: Surgical  Other Topics Concern   Not on file  Social History Narrative   Right handed, Caffeine none, Divorced, 2 kids,  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 [place],   13.5 yrs school; one level home   Social Determinants of Health   Financial Resource Strain:    Difficulty of Paying Living Expenses: Not on file  Food Insecurity:    Worried About Charity fundraiser in the Last Year: Not on file   [employer] of Food in the Last Year: Not on file  Transportation Needs:    Lack of Transportation (Medical): Not on file   Lack of Transportation (Non-Medical): Not on file  Physical Activity:    Days of Exercise per Week: Not on file   Minutes of Exercise per Session: Not on file  Stress: No Stress Concern Present   Feeling of Stress : Only a little  Social Connections:    Frequency of Communication with Friends and Family: Not on file   Frequency of Social Gatherings with Friends and Family: Not on file   Attends Religious Services: Not on Electrical engineer or Organizations: Not on file   Attends Archivist Meetings: Not on file   Marital Status: Not on file    Family History  Problem Relation Age of Onset   Depression Mother    Cancer - Other Father    Kidney disease Father    Cancer - Other Sister        breast ca   Cancer - Other Brother        lymphoma in remission   Lymphoma Brother    Cancer - Other Sister        in remission breast ca    Review of Systems  Constitutional: Positive for appetite change (dec). Negative for chills and fever.  Gastrointestinal: Positive for abdominal pain (left), constipation and nausea. Negative for blood in stool (no melena).  Genitourinary: Positive for flank pain  (left). Negative for dysuria, frequency and hematuria.       Objective:   Vitals:   08/19/20 1307  BP: 128/84  Pulse: 66  Temp: 97.9 F (36.6 C)  SpO2: 97%   BP Readings from Last 3 Encounters:  08/19/20 128/84  05/22/20 (!) 150/84  05/01/20 (!) 142/68   Wt Readings from Last 3 Encounters:  08/19/20 223 lb (101.2 kg)  05/22/20 222 lb (100.7 kg)  04/14/20 217 lb 8 oz (98.7 kg)   Body mass index is 37.11 kg/m.   Physical Exam Constitutional:      General: She is not in acute distress.    Appearance: Normal appearance. She is not ill-appearing.  HENT:     Head: Normocephalic and atraumatic.  Cardiovascular:     Pulses: Normal pulses.     Heart sounds: Normal heart sounds. No murmur heard.   Pulmonary:     Effort: Pulmonary effort is normal.     Breath sounds: Normal breath sounds.  Abdominal:     General: There is no distension.     Palpations: Abdomen is soft.     Tenderness: There is no abdominal tenderness. There is no right CVA tenderness, left CVA tenderness, guarding or rebound.  Skin:    General: Skin is warm and dry.  Neurological:     Mental Status: She is alert.            Assessment & Plan:    See Problem List for Assessment and Plan of chronic medical problems.    This visit occurred during the SARS-CoV-2 public health emergency.  Safety protocols were in place, including screening questions prior to  the visit, additional usage of staff PPE, and extensive cleaning of exam room while observing appropriate contact time as indicated for disinfecting solutions.

## 2020-08-19 ENCOUNTER — Encounter: Payer: Self-pay | Admitting: Internal Medicine

## 2020-08-19 ENCOUNTER — Other Ambulatory Visit: Payer: Self-pay

## 2020-08-19 ENCOUNTER — Ambulatory Visit (INDEPENDENT_AMBULATORY_CARE_PROVIDER_SITE_OTHER): Payer: PPO | Admitting: Internal Medicine

## 2020-08-19 VITALS — BP 128/84 | HR 66 | Temp 97.9°F | Ht 65.0 in | Wt 223.0 lb

## 2020-08-19 DIAGNOSIS — R109 Unspecified abdominal pain: Secondary | ICD-10-CM | POA: Insufficient documentation

## 2020-08-19 DIAGNOSIS — K5904 Chronic idiopathic constipation: Secondary | ICD-10-CM

## 2020-08-19 MED ORDER — HYDROCODONE-HOMATROPINE 5-1.5 MG/5ML PO SYRP: 5.0000 mL | ORAL_SOLUTION | Freq: Four times a day (QID) | ORAL | 0 refills | Status: DC | PRN

## 2020-08-19 NOTE — Assessment & Plan Note (Signed)
Acute Likely related to severe constipation Currently pain-free Pain started 2 days ago and resolved yesterday after having 3-4 bowel movements Exam benign No further evaluation at this time Advised sticking with her constipation regimen and taking something as needed-typically takes MiraLAX Call if symptoms recur

## 2020-08-19 NOTE — Assessment & Plan Note (Signed)
Chronic Fairly controlled overall, but recent flare of constipation which is likely the cause of her left flank pain.  Flank pain radiating down to left lower quadrant-improved after having 3-4 bowel movements yesterday Today pain-free No further evaluation of pain Continue daily bowel regimen and advised her to take anything she needs to keep her bowels regular

## 2020-08-19 NOTE — Patient Instructions (Signed)
Your pain was likely from severe constipation.  Continue to work on controlling your constipation.    If your pain recurs please call us and let us know.

## 2020-08-21 ENCOUNTER — Telehealth: Payer: Self-pay

## 2020-08-21 NOTE — Telephone Encounter (Signed)
Pt has been informed.

## 2020-08-21 NOTE — Telephone Encounter (Signed)
New message    The patient was instructed to call back if symptoms are not getting any better.   Asking for the next steps from Dr. Quay Burow

## 2020-08-21 NOTE — Telephone Encounter (Signed)
Have her take miralax daily and see if that helps.

## 2020-08-22 ENCOUNTER — Ambulatory Visit: Payer: No Typology Code available for payment source | Admitting: Internal Medicine

## 2020-08-29 ENCOUNTER — Telehealth: Payer: Self-pay | Admitting: Internal Medicine

## 2020-08-29 DIAGNOSIS — F5104 Psychophysiologic insomnia: Secondary | ICD-10-CM

## 2020-08-29 MED ORDER — ZOLPIDEM TARTRATE 10 MG PO TABS: ORAL_TABLET | ORAL | 0 refills | Status: DC

## 2020-08-29 NOTE — Addendum Note (Signed)
Addended by: Sherlene Shams on: 08/29/2020 11:18 AM   Modules accepted: Orders

## 2020-08-29 NOTE — Telephone Encounter (Signed)
Patient is requesting a refill on the following medications ; zolpidem (AMBIEN) 10 MG tablet  & ALPRAZolam (XANAX) 1 MG tablet  [id number] - [place], [place] - [place] Phone:  [phone]  Fax:  [phone]

## 2020-08-29 NOTE — Telephone Encounter (Signed)
Will refill Ambien; too soon for Xanax- Dr. Sharlet Salina wrote prescription on 7/26 with 2 refills.

## 2020-09-03 ENCOUNTER — Other Ambulatory Visit: Payer: Self-pay

## 2020-09-03 ENCOUNTER — Encounter: Payer: Self-pay | Admitting: Plastic Surgery

## 2020-09-03 ENCOUNTER — Ambulatory Visit (INDEPENDENT_AMBULATORY_CARE_PROVIDER_SITE_OTHER): Payer: PPO | Admitting: Plastic Surgery

## 2020-09-03 VITALS — HR 72 | Temp 98.4°F | Ht 64.0 in | Wt 228.0 lb

## 2020-09-03 DIAGNOSIS — L989 Disorder of the skin and subcutaneous tissue, unspecified: Secondary | ICD-10-CM | POA: Diagnosis not present

## 2020-09-03 NOTE — Progress Notes (Signed)
Referring Provider [name], MD [address] [place],  [place] [address]   CC:  Chief Complaint  Patient presents with  . Advice Only      Gabrielle [name] is an 69 y.o. female.  HPI: Patient presents to discuss issues with her eyelids.  Her problem started with a lower lid blepharoplasty that was performed here locally in the summer 2019.  She reports significant pain in the left eye thereafter and has had quite a few issues with vision in that eye ever since.  The clinical picture she describes is suggestive of a retrobulbar hematoma.  She has been seen at [facility] for a lot of issues regarding her dry eyes and vision and I believe she is seen an oculoplastic surgeon who is discussing removing a cyst from the lateral aspect of her right lower lid and also potentially performing some ectropion repair work on her left lower lid.  She is interested in my opinion about her plan.  Allergies  Allergen Reactions  . Iodinated Diagnostic Agents Hives  . Other Hives, Other (See Comments) and Rash  . Shellfish Allergy Hives, Other (See Comments) and Rash  . Gadolinium Derivatives Hives  . Hydrocodone Other (See Comments)    insomnia  . Metronidazole Nausea And Vomiting and Other (See Comments)  . Acetazolamide Anxiety    Makes head feel strange  . Gabapentin Anxiety    unknown  . Mirtazapine Other (See Comments)    Outpatient Encounter Medications as of 09/03/2020  Medication Sig Note  . ALPRAZolam (XANAX) 1 MG tablet Take 1 tablet (1 mg total) by mouth 3 (three) times daily. (Patient taking differently: Take 1 mg by mouth 3 (three) times daily. 1 mg bid -takes differently)   . carboxymethylcellulose (REFRESH PLUS) 0.5 % SOLN 1 drop 3 (three) times daily as needed. Uses kind with no preservatives Only can see in R eye   . cholecalciferol (VITAMIN D) 1000 UNITS tablet Take 1,000 Units by mouth daily.    Marland Kitchen doxepin (SINEQUAN) 10 MG capsule Take 10 mg by mouth daily.    Marland Kitchen  ketotifen (ALAWAY) 0.025 % ophthalmic solution Place 1 drop into both eyes 2 (two) times daily.   . Multiple Vitamins-Minerals (MULTIVITAMIN WITH MINERALS) tablet Take 1 tablet by mouth daily.   Marland Kitchen omeprazole (PRILOSEC) 40 MG capsule TAKE 1 CAPSULE BY MOUTH EVERY DAY   . polyethylene glycol (MIRALAX) packet Take 17 g by mouth daily as needed.    . sucralfate (CARAFATE) 1 GM/10ML suspension Take 10 mLs (1 g total) by mouth 4 (four) times daily -  with meals and at bedtime.   Marland Kitchen zolpidem (AMBIEN) 10 MG tablet TAKE 1 TO 1 & 1/2 TABLET AT BEDTIME AS NEEDED FOR SLEEP   . acetaminophen (TYLENOL) 500 MG tablet Take 500 mg by mouth every 6 (six) hours as needed.  (Patient not taking: Reported on 09/03/2020)   . HYDROcodone-homatropine (HYCODAN) 5-1.5 MG/5ML syrup Take 5 mLs by mouth every 6 (six) hours as needed for cough. (Patient not taking: Reported on 09/03/2020)   . Loteprednol Etabonate (EYSUVIS) 0.25 % SUSP Apply 1 drop to eye in the morning, at noon, in the evening, and at bedtime. x2weeks  (Patient not taking: Reported on 09/03/2020) 05/01/2020: Will end 5.18.21   No facility-administered encounter medications on file as of 09/03/2020.     Past Medical History:  Diagnosis Date  . Anemia    as a child  . Anxiety    takes  Xanax daily  . Arthritis   . Cataracts, bilateral    immature  . Chronic insomnia 10/11/2014  . Complication of anesthesia   . Constipation    will occasionally take Milk of Mag  . Diverticulosis   . Dizziness    rarely  . GERD (gastroesophageal reflux disease)    takes Omeprazole every other day  . Glaucoma    borderline and no drops required  . Hearing loss    but doesn't have hearing aids  . History of bronchitis    many many yrs ago  . History of colitis   . History of colon polyps   . Hypertension    hasn't been on meds for the past 78yrs   . Insomnia    takes Trazodone nightly as needed and Ambien nightly   . 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 Joint pain    . Joint swelling   . PONV (postoperative nausea and vomiting)   . Tinnitus    takes HCTZ daily to decrease pressure in ears    Past Surgical History:  Procedure Laterality Date  . ABDOMINAL HYSTERECTOMY    . APPENDECTOMY    . COLONOSCOPY    . IR RADIOLOGIST EVAL & MGMT  06/08/2017  . tinnitus Right   . TONSILLECTOMY    . TOTAL KNEE ARTHROPLASTY Right 03/13/2014   DR MURPHY  . TOTAL KNEE ARTHROPLASTY Right 03/13/2014   Procedure: TOTAL KNEE ARTHROPLASTY;  Surgeon: [name], MD;  Location: [facility];  Service: Orthopedics;  Laterality: Right;  . TUBAL LIGATION      Family History  Problem Relation Age of Onset  . Depression Mother   . Cancer - Other Father   . Kidney disease Father   . Cancer - Other Sister        breast ca  . Cancer - Other Brother        lymphoma in remission  . Lymphoma Brother   . 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 Go to Review Flowsheets activity to see all data.    General:  No acute distress,  Alert and oriented, Non-Toxic, Normal speech and affect Examination shows a 2 mm diameter cystic lesion in the lateral aspect of the right lower lid near the lateral canthus.  It is freely mobile and directly beneath the skin.  The overlying skin is atrophied.  It looks to be just adjacent to her lower Bleph incision.  Her lower lids both show a little bit of laxity and retraction inferiorly  indicating somewhat diminished support.  She does not have any obvious scleral irritation visually.  Assessment/Plan Patient presents with a cystic lesion in her right lower lid status post bilateral lower lid blepharoplasty.  She also has a little bit of mild ectropion on both sides.  She is notably apprehensive about any sort of surgical procedure after which she describes as a bad experience with her lower lid Bleph.  I think the risk profile for excision of the cyst in the right lower lid is totally favorable and I would recommend that to her certainly.  In terms of the other lower lid complaints I am uncertain if the chances of improvement would be high enough to justify the risk of the additional procedures given that a lot of her complaints may or may not be addressed by the repair of the ectropion.  She is heavily opposed to any additional risk from surgical procedures so I am not sure that additional surgery would be worth it to her.  I explained I would be willing to take off the cyst in her right lower lid if she wanted and would do so under local or she could keep her current appointment and have it done at [facility].  I think either option would be fine for her.  Cindra Presume 09/03/2020, 2:13 PM

## 2020-09-10 DIAGNOSIS — E785 Hyperlipidemia, unspecified: Secondary | ICD-10-CM | POA: Diagnosis not present

## 2020-09-10 DIAGNOSIS — Z23 Encounter for immunization: Secondary | ICD-10-CM | POA: Diagnosis not present

## 2020-09-10 DIAGNOSIS — F411 Generalized anxiety disorder: Secondary | ICD-10-CM | POA: Diagnosis not present

## 2020-09-10 DIAGNOSIS — D696 Thrombocytopenia, unspecified: Secondary | ICD-10-CM | POA: Diagnosis not present

## 2020-09-10 DIAGNOSIS — R8281 Pyuria: Secondary | ICD-10-CM | POA: Diagnosis not present

## 2020-09-10 DIAGNOSIS — Z0000 Encounter for general adult medical examination without abnormal findings: Secondary | ICD-10-CM | POA: Diagnosis not present

## 2020-09-10 DIAGNOSIS — M545 Low back pain: Secondary | ICD-10-CM | POA: Diagnosis not present

## 2020-09-10 DIAGNOSIS — I10 Essential (primary) hypertension: Secondary | ICD-10-CM | POA: Diagnosis not present

## 2020-09-18 ENCOUNTER — Telehealth: Payer: Self-pay | Admitting: Emergency Medicine

## 2020-09-18 DIAGNOSIS — H018 Other specified inflammations of eyelid: Secondary | ICD-10-CM | POA: Diagnosis not present

## 2020-09-18 DIAGNOSIS — H04123 Dry eye syndrome of bilateral lacrimal glands: Secondary | ICD-10-CM | POA: Diagnosis not present

## 2020-09-18 NOTE — Telephone Encounter (Signed)
Noted  

## 2020-09-18 NOTE — Telephone Encounter (Signed)
Ty called stating she went out on a house call for pt. Pts fall risk is now moderate. She feels unsteady while ambulating and she also uses equipment while ambulating. She just wanted to make you aware. Thanks.

## 2020-09-23 ENCOUNTER — Telehealth: Payer: Self-pay | Admitting: Internal Medicine

## 2020-09-23 NOTE — Telephone Encounter (Signed)
Pt has been informed and expressed understanding.  

## 2020-09-23 NOTE — Telephone Encounter (Signed)
When to the eye doctor on 9.23.21, they suggested omega 3 supplements for eye  Is this safe for the patient to take? Which brand would be recommended  When is she eligible for the booster if she got her last pfizer injection on April 28th?

## 2020-09-23 NOTE — Telephone Encounter (Signed)
Yes, it is safe for her to take omega-3 supplements.  No specific plan to recommend-just get a reputable brand.  She is eligible for a Covid booster 6 months after her last shot.

## 2020-09-23 NOTE — Addendum Note (Signed)
Addended by: Binnie Rail on: 09/23/2020 04:39 PM   Modules accepted: Orders

## 2020-10-03 DIAGNOSIS — F329 Major depressive disorder, single episode, unspecified: Secondary | ICD-10-CM | POA: Diagnosis not present

## 2020-10-03 DIAGNOSIS — F4312 Post-traumatic stress disorder, chronic: Secondary | ICD-10-CM | POA: Diagnosis not present

## 2020-10-03 DIAGNOSIS — G4700 Insomnia, unspecified: Secondary | ICD-10-CM | POA: Diagnosis not present

## 2020-10-03 DIAGNOSIS — F411 Generalized anxiety disorder: Secondary | ICD-10-CM | POA: Diagnosis not present

## 2020-10-06 ENCOUNTER — Telehealth: Payer: Self-pay

## 2020-10-06 ENCOUNTER — Telehealth: Payer: Self-pay | Admitting: Internal Medicine

## 2020-10-06 ENCOUNTER — Other Ambulatory Visit: Payer: Self-pay | Admitting: Family

## 2020-10-06 DIAGNOSIS — F5104 Psychophysiologic insomnia: Secondary | ICD-10-CM

## 2020-10-06 MED ORDER — ZOLPIDEM TARTRATE 10 MG PO TABS: ORAL_TABLET | ORAL | 0 refills | Status: DC

## 2020-10-06 NOTE — Telephone Encounter (Signed)
pt called left message wanting to know if you received her test results yet?

## 2020-10-06 NOTE — Addendum Note (Signed)
Addended by: Binnie Rail on: 10/06/2020 04:51 PM   Modules accepted: Orders

## 2020-10-06 NOTE — Telephone Encounter (Signed)
Pls check the status Jess and pls let her know.

## 2020-10-06 NOTE — Telephone Encounter (Signed)
Patient is requesting a refill on the following medication : zolpidem (AMBIEN) 10 MG tablet  [id number] [place] [place] [facility] [place] Phone:  [phone]  Fax:  [phone]

## 2020-10-10 ENCOUNTER — Ambulatory Visit (INDEPENDENT_AMBULATORY_CARE_PROVIDER_SITE_OTHER): Payer: PPO | Admitting: Family Medicine

## 2020-10-10 ENCOUNTER — Encounter: Payer: Self-pay | Admitting: Family Medicine

## 2020-10-10 ENCOUNTER — Other Ambulatory Visit: Payer: Self-pay

## 2020-10-10 VITALS — BP 126/84 | HR 67 | Temp 97.4°F | Ht 64.0 in | Wt 227.0 lb

## 2020-10-10 DIAGNOSIS — B029 Zoster without complications: Secondary | ICD-10-CM

## 2020-10-10 MED ORDER — VALACYCLOVIR HCL 1 G PO TABS: 1000.0000 mg | ORAL_TABLET | Freq: Three times a day (TID) | ORAL | 0 refills | Status: AC

## 2020-10-10 NOTE — Progress Notes (Signed)
Gabrielle [name] is a 69 y.o. female  Chief Complaint  Patient presents with  . Acute Visit    rash on on LT wrist x 3 days that itches off/on.  has been using OTC cortizone crem with little relief.      HPI: Gabrielle [name] is a 69 y.o. female patient of Dr. Sharlet Salina who complains of a rash on her Lt wrist that has been present x 3 days. Pt states it has itched at times, but more so when it first appeared.  Denies fever, chills.  She has been using 1% hydrocortisone PRN itch. She has also been using triamcinolone BID x 3 days w/o change.  She had 1 dose of shingrix in 07/2019 and broke out in a rash on her arm so she states PCP said not to get 2nd dose.   Past Medical History:  Diagnosis Date  . Anemia    as a child  . Anxiety    takes Xanax daily  . Arthritis   . Cataracts, bilateral    immature  . Chronic insomnia 10/11/2014  . Complication of anesthesia   . Constipation    will occasionally take Milk of Mag  . Diverticulosis   . Dizziness    rarely  . GERD (gastroesophageal reflux disease)    takes Omeprazole every other day  . Glaucoma    borderline and no drops required  . Hearing loss    but doesn't have hearing aids  . History of bronchitis    many many yrs ago  . History of colitis   . History of colon polyps   . Hypertension    hasn't been on meds for the past 64yrs   . Insomnia    takes Trazodone nightly as needed and Ambien nightly   . 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 Joint pain   . Joint swelling   . PONV (postoperative nausea and vomiting)   . Tinnitus    takes HCTZ daily to decrease pressure in ears    Past Surgical History:  Procedure Laterality Date  . ABDOMINAL HYSTERECTOMY    . APPENDECTOMY    . COLONOSCOPY    . IR RADIOLOGIST EVAL & MGMT  06/08/2017  . tinnitus Right   . TONSILLECTOMY    . TOTAL KNEE ARTHROPLASTY Right 03/13/2014   DR MURPHY  . TOTAL KNEE ARTHROPLASTY Right 03/13/2014   Procedure: TOTAL KNEE ARTHROPLASTY;   Surgeon: [name], MD;  Location: [facility];  Service: Orthopedics;  Laterality: Right;  . TUBAL LIGATION      Social History   Socioeconomic History  . Marital status: Divorced    Spouse name: Not on file  . Number of children: 2  . Years of education: Not on file  . Highest education level: Not on file  Occupational History  . Occupation: retired  Tobacco Use  . Smoking status: Former Smoker    Packs/day: 1.00    Years: 22.00    Pack years: 22.00    Types: Cigarettes    Start date: 03/08/1972    Quit date: 01/08/1994    Years since quitting: 26.7  . Smokeless tobacco: Never Used  . Tobacco comment: quit smoking 28yrs ago  Vaping Use  . Vaping Use: Never used  Substance and Sexual Activity  . Alcohol use: No    Alcohol/week: 0.0 standard drinks  . Drug use: No  . Sexual activity: Not Currently    Birth control/protection: Surgical  Other Topics  Concern  . Not on file  Social History Narrative   Right handed, Caffeine none, Divorced, 2 kids,  PT - Housing auth in [place],   13.5 yrs school; one level home   Social Determinants of Health   Financial Resource Strain:   . Difficulty of Paying Living Expenses: Not on file  Food Insecurity:   . Worried About Charity fundraiser in the Last Year: Not on file  . Ran Out of Food in the Last Year: Not on file  Transportation Needs:   . Lack of Transportation (Medical): Not on file  . Lack of Transportation (Non-Medical): Not on file  Physical Activity:   . Days of Exercise per Week: Not on file  . Minutes of Exercise per Session: Not on file  Stress:   . Feeling of Stress : Not on file  Social Connections:   . Frequency of Communication with Friends and Family: Not on file  . Frequency of Social Gatherings with Friends and Family: Not on file  . Attends Religious Services: Not on file  . Active Member of Clubs or Organizations: Not on file  . Attends Archivist Meetings: Not on file  . Marital Status: Not on  file  Intimate Partner Violence:   . Fear of Current or Ex-Partner: Not on file  . Emotionally Abused: Not on file  . Physically Abused: Not on file  . Sexually Abused: Not on file    Family History  Problem Relation Age of Onset  . Depression Mother   . Cancer - Other Father   . Kidney disease Father   . Cancer - Other Sister        breast ca  . Cancer - Other Brother        lymphoma in remission  . Lymphoma Brother   . Cancer - Other Sister        in remission breast ca     Immunization History  Administered Date(s) Administered  . Influenza, High Dose Seasonal PF 11/28/2018  . Influenza,inj,Quad PF,6+ Mos 09/27/2019  . Influenza-Unspecified 09/26/2016, 09/18/2017, 09/26/2020  . PFIZER SARS-COV-2 Vaccination 03/27/2020, 04/22/2020    Outpatient Encounter Medications as of 10/10/2020  Medication Sig  . ALPRAZolam (XANAX) 1 MG tablet Take 1 tablet (1 mg total) by mouth 3 (three) times daily. (Patient taking differently: Take 1 mg by mouth 3 (three) times daily. 1 mg bid -takes differently)  . carboxymethylcellulose (REFRESH PLUS) 0.5 % SOLN 1 drop 3 (three) times daily as needed. Uses kind with no preservatives Only can see in R eye  . cholecalciferol (VITAMIN D) 1000 UNITS tablet Take 1,000 Units by mouth daily.   . Multiple Vitamins-Minerals (MULTIVITAMIN WITH MINERALS) tablet Take 1 tablet by mouth daily.  Marland Kitchen omeprazole (PRILOSEC) 40 MG capsule TAKE 1 CAPSULE BY MOUTH EVERY DAY  . polyethylene glycol (MIRALAX) packet Take 17 g by mouth daily as needed.   . sucralfate (CARAFATE) 1 GM/10ML suspension Take 10 mLs (1 g total) by mouth 4 (four) times daily -  with meals and at bedtime.  Marland Kitchen zolpidem (AMBIEN) 10 MG tablet TAKE 1 TO 1 & 1/2 TABLET AT BEDTIME AS NEEDED FOR SLEEP  . doxepin (SINEQUAN) 10 MG capsule Take 10 mg by mouth daily.  (Patient not taking: Reported on 10/10/2020)  . ketotifen (ALAWAY) 0.025 % ophthalmic solution Place 1 drop into both eyes 2 (two) times daily.  (Patient not taking: Reported on 10/10/2020)   No facility-administered encounter medications on file as  of 10/10/2020.     ROS: Pertinent positives and negatives noted in HPI. Remainder of ROS non-contributory    Allergies  Allergen Reactions  . Iodinated Diagnostic Agents Hives  . Other Hives, Other (See Comments) and Rash  . Shellfish Allergy Hives, Other (See Comments) and Rash  . Gadolinium Derivatives Hives  . Hydrocodone Other (See Comments)    insomnia  . Metronidazole Nausea And Vomiting and Other (See Comments)  . Acetazolamide Anxiety    Makes head feel strange  . Gabapentin Anxiety    unknown  . Mirtazapine Other (See Comments)    BP 126/84   Pulse 67   Temp (!) 97.4 F (36.3 C) (Temporal)   Ht 5\' 4"  (1.626 m)   Wt 227 lb (103 kg)   SpO2 98%   BMI 38.96 kg/m   Physical Exam Constitutional:      General: She is not in acute distress.    Appearance: Normal appearance. She is not ill-appearing.  Pulmonary:     Effort: No respiratory distress.  Skin:    Findings: Rash (erythematous, papulovesicular rash on left lateral forearm just proximal to wrist) present.  Neurological:     Mental Status: She is alert and oriented to person, place, and time.  Psychiatric:        Mood and Affect: Mood normal.        Behavior: Behavior normal.      A/P:  1. Herpes zoster without complication - Lt forearm, likely shingles - explained to pt that while I am not fully certain it is shingles, it does resemble shingles rash and is present in dermatomal pattern. Pt denies burning/tingling/etc and endorses "itching" when rash first appeared. Discussed at length with pt and agreed to treat with antiviral med Rx: - valACYclovir (VALTREX) 1000 MG tablet; Take 1 tablet (1,000 mg total) by mouth 3 (three) times daily for 7 days.  Dispense: 21 tablet; Refill: 0 - f/u PRN if symptoms worsen or do not improve in 10-14 days Discussed plan and reviewed medications with patient,  including risks, benefits, and potential side effects. Pt expressed understand. All questions answered.   This visit occurred during the SARS-CoV-2 public health emergency.  Safety protocols were in place, including screening questions prior to the visit, additional usage of staff PPE, and extensive cleaning of exam room while observing appropriate contact time as indicated for disinfecting solutions.

## 2020-10-15 DIAGNOSIS — H02535 Eyelid retraction left lower eyelid: Secondary | ICD-10-CM | POA: Diagnosis not present

## 2020-10-15 DIAGNOSIS — H02822 Cysts of right lower eyelid: Secondary | ICD-10-CM | POA: Diagnosis not present

## 2020-10-15 DIAGNOSIS — H02825 Cysts of left lower eyelid: Secondary | ICD-10-CM | POA: Diagnosis not present

## 2020-10-15 DIAGNOSIS — H02532 Eyelid retraction right lower eyelid: Secondary | ICD-10-CM | POA: Diagnosis not present

## 2020-10-25 ENCOUNTER — Other Ambulatory Visit: Payer: Self-pay | Admitting: Internal Medicine

## 2020-10-31 ENCOUNTER — Telehealth: Payer: Self-pay | Admitting: Internal Medicine

## 2020-10-31 DIAGNOSIS — F5104 Psychophysiologic insomnia: Secondary | ICD-10-CM

## 2020-10-31 DIAGNOSIS — H938X3 Other specified disorders of ear, bilateral: Secondary | ICD-10-CM | POA: Diagnosis not present

## 2020-10-31 MED ORDER — ZOLPIDEM TARTRATE 10 MG PO TABS: ORAL_TABLET | ORAL | 0 refills | Status: DC

## 2020-10-31 NOTE — Telephone Encounter (Signed)
    Patient requesting refill for zolpidem (AMBIEN) 10 MG tablet Pharmacy [id number] [place]

## 2020-10-31 NOTE — Telephone Encounter (Signed)
Sent to Dr. Quay Burow.

## 2020-11-04 ENCOUNTER — Telehealth: Payer: Self-pay

## 2020-11-04 NOTE — Telephone Encounter (Signed)
pt called states she wants the results of her gene testing.

## 2020-11-04 NOTE — Telephone Encounter (Signed)
Results will be discussed in session . However you could mail her a copy if she wants it . Please let patient know.

## 2020-11-10 ENCOUNTER — Encounter: Payer: Self-pay | Admitting: Internal Medicine

## 2020-11-10 ENCOUNTER — Other Ambulatory Visit: Payer: Self-pay

## 2020-11-10 ENCOUNTER — Ambulatory Visit (INDEPENDENT_AMBULATORY_CARE_PROVIDER_SITE_OTHER): Payer: PPO | Admitting: Internal Medicine

## 2020-11-10 VITALS — BP 138/80 | HR 60 | Temp 98.0°F | Ht 64.0 in | Wt 225.0 lb

## 2020-11-10 DIAGNOSIS — F4312 Post-traumatic stress disorder, chronic: Secondary | ICD-10-CM | POA: Diagnosis not present

## 2020-11-10 DIAGNOSIS — F411 Generalized anxiety disorder: Secondary | ICD-10-CM | POA: Diagnosis not present

## 2020-11-10 DIAGNOSIS — F5104 Psychophysiologic insomnia: Secondary | ICD-10-CM | POA: Diagnosis not present

## 2020-11-10 NOTE — Patient Instructions (Signed)
I will find out about the CT angiogram of the brain.   I have sent in the refills.

## 2020-11-10 NOTE — Progress Notes (Signed)
   Subjective:   Patient ID: Gabrielle [name], female    DOB: Sep 01, 1951, 69 y.o.   MRN: [id number]  HPI The patient is a 69 YO female coming in for follow up of her anxiety. This is overall stable but not improving. She has seen psychiatry and they did a genetic study to see which medications might be best for her. She has not heard back about results and was not impressed with the visit and follow up from the clinic. She is still using xanax and ambien for sleep and anxiety. She is having worsening problems with tinnitus which is making this worse overall. She does intend to follow up with psych. She is struggling with potential diagnosis of intracranial hypertension. She feels unable to have an MRI of the brain to check blood vessels and does not want lumbar puncture and IR will not consider angiogram directly to evaluate.   Review of Systems  Constitutional: Negative.   HENT: Positive for tinnitus.   Eyes: Positive for visual disturbance.  Respiratory: Negative for cough, chest tightness and shortness of breath.   Cardiovascular: Negative for chest pain, palpitations and leg swelling.  Gastrointestinal: Negative for abdominal distention, abdominal pain, constipation, diarrhea, nausea and vomiting.  Musculoskeletal: Negative.   Skin: Negative.   Neurological: Negative.   Psychiatric/Behavioral: Positive for sleep disturbance. The patient is nervous/anxious.     Objective:  Physical Exam Constitutional:      Appearance: She is well-developed.  HENT:     Head: Normocephalic and atraumatic.  Cardiovascular:     Rate and Rhythm: Normal rate and regular rhythm.  Pulmonary:     Effort: Pulmonary effort is normal. No respiratory distress.     Breath sounds: Normal breath sounds. No wheezing or rales.  Abdominal:     General: Bowel sounds are normal. There is no distension.     Palpations: Abdomen is soft.     Tenderness: There is no abdominal tenderness. There is no rebound.    Musculoskeletal:     Cervical back: Normal range of motion.  Skin:    General: Skin is warm and dry.  Neurological:     Mental Status: She is alert and oriented to person, place, and time.     Coordination: Coordination normal.     Vitals:   11/10/20 1321  BP: 138/80  Pulse: 60  Temp: 98 F (36.7 C)  TempSrc: Oral  SpO2: 97%  Weight: 225 lb (102.1 kg)  Height: 5\' 4"  (1.626 m)    This visit occurred during the SARS-CoV-2 public health emergency.  Safety protocols were in place, including screening questions prior to the visit, additional usage of staff PPE, and extensive cleaning of exam room while observing appropriate contact time as indicated for disinfecting solutions.   Assessment & Plan:

## 2020-11-11 ENCOUNTER — Encounter: Payer: Self-pay | Admitting: Internal Medicine

## 2020-11-11 MED ORDER — ALPRAZOLAM 1 MG PO TABS: 1.0000 mg | ORAL_TABLET | Freq: Three times a day (TID) | ORAL | 4 refills | Status: DC

## 2020-11-11 MED ORDER — ZOLPIDEM TARTRATE 10 MG PO TABS: ORAL_TABLET | ORAL | 4 refills | Status: DC

## 2020-11-11 NOTE — Assessment & Plan Note (Signed)
She will contact the office again to get and discuss results of gene study and potentially try alternative medication for her anxiety. Her health problems in recent years (vision changes and tinnitus) are driving a lot of her anxiety and prior to that she had weaned off successfully from the xanax. Refilled both today for her ambien and xanax.

## 2020-12-30 ENCOUNTER — Encounter (INDEPENDENT_AMBULATORY_CARE_PROVIDER_SITE_OTHER): Payer: PPO | Admitting: Ophthalmology

## 2020-12-30 DIAGNOSIS — K5730 Diverticulosis of large intestine without perforation or abscess without bleeding: Secondary | ICD-10-CM | POA: Diagnosis not present

## 2020-12-30 DIAGNOSIS — K5904 Chronic idiopathic constipation: Secondary | ICD-10-CM | POA: Diagnosis not present

## 2020-12-30 DIAGNOSIS — K219 Gastro-esophageal reflux disease without esophagitis: Secondary | ICD-10-CM | POA: Diagnosis not present

## 2020-12-30 DIAGNOSIS — Z800 Family history of malignant neoplasm of digestive organs: Secondary | ICD-10-CM | POA: Diagnosis not present

## 2020-12-31 DIAGNOSIS — H02115 Cicatricial ectropion of left lower eyelid: Secondary | ICD-10-CM | POA: Diagnosis not present

## 2020-12-31 DIAGNOSIS — H02112 Cicatricial ectropion of right lower eyelid: Secondary | ICD-10-CM | POA: Diagnosis not present

## 2021-01-02 ENCOUNTER — Encounter: Payer: Self-pay | Admitting: Internal Medicine

## 2021-01-02 ENCOUNTER — Other Ambulatory Visit: Payer: Self-pay

## 2021-01-02 ENCOUNTER — Telehealth (INDEPENDENT_AMBULATORY_CARE_PROVIDER_SITE_OTHER): Payer: PPO | Admitting: Internal Medicine

## 2021-01-02 DIAGNOSIS — K219 Gastro-esophageal reflux disease without esophagitis: Secondary | ICD-10-CM

## 2021-01-02 MED ORDER — ESOMEPRAZOLE MAGNESIUM 40 MG PO CPDR: 40.0000 mg | DELAYED_RELEASE_CAPSULE | Freq: Two times a day (BID) | ORAL | 3 refills | Status: DC

## 2021-01-02 NOTE — Assessment & Plan Note (Signed)
Switch from dexilant to nexium BID. Continue sucralfate. Referral done to GI for second opinion and possible EGD. EGD results from 2019 available in media tab.

## 2021-01-02 NOTE — Progress Notes (Signed)
Virtual Visit via Audio Note  I connected with Gabrielle [name] on 01/02/21 at  1:20 PM EST by an audio-only enabled telemedicine application and verified that I am speaking with the correct person using two identifiers.  The patient and the provider were at separate locations throughout the entire encounter. Patient location: home, Provider location: work   I discussed the limitations of evaluation and management by telemedicine and the availability of in person appointments. The patient expressed understanding and agreed to proceed. The patient and the provider were the only parties present for the visit unless noted in HPI below.  History of Present Illness: The patient is a 70 y.o. female with visit for burning in throat and esophagus and cannot eat well since Thanksgiving. Went to GI Dr. Collene Mares last Tuesday and they did labs which she is not sure will help. Has severe pain in the throat. Denies fevers or chills. They did call in dexilant which she has tried for 3 days and it is not helping. Overall it is not helping and she would like to try another GI doctor. Had EGD in 2019 but does not have confidence in that doctor. Has tried many different PPI in the past and is chronically on sucralfate and has been taking that without relief also lately.   Observations/Objective: A and O times 3, voice strong, no dyspnea or coughing during visit  Assessment and Plan: See problem oriented charting  Follow Up Instructions: rx nexium BID and referral to GI for second opinion  Visit time 16 minutes in non-face to face communication with patient and coordination of care.    I discussed the assessment and treatment plan with the patient. The patient was provided an opportunity to ask questions and all were answered. The patient agreed with the plan and demonstrated an understanding of the instructions.   The patient was advised to call back or seek an in-person evaluation if the symptoms worsen or if the  condition fails to improve as anticipated.  [name], MD

## 2021-01-07 DIAGNOSIS — I10 Essential (primary) hypertension: Secondary | ICD-10-CM | POA: Diagnosis not present

## 2021-01-07 DIAGNOSIS — D696 Thrombocytopenia, unspecified: Secondary | ICD-10-CM | POA: Diagnosis not present

## 2021-01-07 DIAGNOSIS — H9319 Tinnitus, unspecified ear: Secondary | ICD-10-CM | POA: Diagnosis not present

## 2021-01-07 DIAGNOSIS — Z0000 Encounter for general adult medical examination without abnormal findings: Secondary | ICD-10-CM | POA: Diagnosis not present

## 2021-01-07 DIAGNOSIS — F411 Generalized anxiety disorder: Secondary | ICD-10-CM | POA: Diagnosis not present

## 2021-01-07 DIAGNOSIS — R8281 Pyuria: Secondary | ICD-10-CM | POA: Diagnosis not present

## 2021-01-07 DIAGNOSIS — E785 Hyperlipidemia, unspecified: Secondary | ICD-10-CM | POA: Diagnosis not present

## 2021-01-15 ENCOUNTER — Telehealth: Payer: Self-pay | Admitting: Internal Medicine

## 2021-01-15 NOTE — Telephone Encounter (Signed)
Patient wondering if we were going to do a referral to neurology, states she talked about this with Dr. Sharlet Salina at her last visit.

## 2021-01-16 NOTE — Telephone Encounter (Signed)
She has seen neurology for tinnitus, is she wanting to see a different neurologist? Is she wants to see the same group she can just call them to schedule.

## 2021-01-16 NOTE — Telephone Encounter (Signed)
We did referral to GI for second opinion as discussed. We did not discuss neurology referral.

## 2021-01-16 NOTE — Telephone Encounter (Signed)
See below

## 2021-01-16 NOTE — Telephone Encounter (Signed)
Spoke with the patient and she stated that she has not heard from GI. She wants to a referral to neurology for Tinnitus.

## 2021-01-19 ENCOUNTER — Telehealth: Payer: Self-pay | Admitting: Internal Medicine

## 2021-01-19 NOTE — Telephone Encounter (Signed)
Should keep appointment

## 2021-01-19 NOTE — Telephone Encounter (Signed)
    Patient calling to report thigh pain that started over the weekend. She went to [facility], was prescribed Meloxicam and Baclofen Patient states she isn't taking the Meloxicam because she read about possible side effects She is concerned she might need ultrasound Patient was scheduled an appointment for 1/25  Seeking advice, should patient keep 1/25 appointment or be sent for testing first

## 2021-01-19 NOTE — Telephone Encounter (Signed)
See below

## 2021-01-20 ENCOUNTER — Ambulatory Visit (INDEPENDENT_AMBULATORY_CARE_PROVIDER_SITE_OTHER): Payer: PPO | Admitting: Internal Medicine

## 2021-01-20 ENCOUNTER — Other Ambulatory Visit: Payer: Self-pay

## 2021-01-20 ENCOUNTER — Encounter: Payer: Self-pay | Admitting: Internal Medicine

## 2021-01-20 VITALS — BP 132/76 | HR 62 | Temp 98.1°F | Resp 18 | Ht 64.0 in | Wt 220.4 lb

## 2021-01-20 DIAGNOSIS — M79651 Pain in right thigh: Secondary | ICD-10-CM | POA: Diagnosis not present

## 2021-01-20 DIAGNOSIS — H9313 Tinnitus, bilateral: Secondary | ICD-10-CM | POA: Diagnosis not present

## 2021-01-20 DIAGNOSIS — K219 Gastro-esophageal reflux disease without esophagitis: Secondary | ICD-10-CM

## 2021-01-20 NOTE — Telephone Encounter (Signed)
Patient is scheduled to see Dr. Sharlet Salina 01/20/2021.

## 2021-01-20 NOTE — Patient Instructions (Signed)
We will get the ultrasound of the legs to check for blood clots.

## 2021-01-20 NOTE — Progress Notes (Unsigned)
   Subjective:   Patient ID: Gabrielle [name], female    DOB: Mar 10, 1951, 70 y.o.   MRN: [id number]  HPI The patient is a 70 YO female coming in for several concerns including thigh pain (right leg, started 4-5 days ago, no cause she can recall or injury, went to [place] clinic over the weekend, they gave her baclofen muscle relaxer and she has used this with relief of pain, does not want to take too often, denies progression, overall stable from onset, she is concerned about blood clot), and gerd (taking nexium we prescribed and this is helping overall, denies worsening, still using liquid carafate as well she gets through the [place]) and tinnitus (overall worsening in recent years, she has seen multiple providers for this including neurology and audiology and ENT, she has been recommended some tests in workup and has done a lot of testing to evaluate, would like to see a neurologist again to see if they can help with this, it is life limiting).   Review of Systems  Constitutional: Negative.   HENT: Positive for tinnitus.   Eyes: Negative.   Respiratory: Negative for cough, chest tightness and shortness of breath.   Cardiovascular: Negative for chest pain, palpitations and leg swelling.  Gastrointestinal: Negative for abdominal distention, abdominal pain, constipation, diarrhea, nausea and vomiting.       GERD  Musculoskeletal: Positive for myalgias.  Skin: Negative.   Neurological: Negative.   Psychiatric/Behavioral: Negative.     Objective:  Physical Exam Constitutional:      Appearance: She is well-developed and well-nourished.  HENT:     Head: Normocephalic and atraumatic.  Eyes:     Extraocular Movements: EOM normal.  Cardiovascular:     Rate and Rhythm: Normal rate and regular rhythm.  Pulmonary:     Effort: Pulmonary effort is normal. No respiratory distress.     Breath sounds: Normal breath sounds. No wheezing or rales.  Abdominal:     General: Bowel sounds are normal. There is no  distension.     Palpations: Abdomen is soft.     Tenderness: There is no abdominal tenderness. There is no rebound.  Musculoskeletal:        General: Tenderness present. No edema.     Cervical back: Normal range of motion.     Comments: Pain right thigh region midline  Skin:    General: Skin is warm and dry.  Neurological:     Mental Status: She is alert and oriented to person, place, and time.     Coordination: Coordination normal.  Psychiatric:        Mood and Affect: Mood and affect normal.     Vitals:   01/20/21 1440  BP: 132/76  Pulse: 62  Resp: 18  Temp: 98.1 F (36.7 C)  TempSrc: Oral  SpO2: 98%  Weight: 220 lb 6.4 oz (100 kg)  Height: 5\' 4"  (1.626 m)    This visit occurred during the SARS-CoV-2 public health emergency.  Safety protocols were in place, including screening questions prior to the visit, additional usage of staff PPE, and extensive cleaning of exam room while observing appropriate contact time as indicated for disinfecting solutions.   Assessment & Plan:

## 2021-01-21 ENCOUNTER — Ambulatory Visit (HOSPITAL_COMMUNITY)
Admission: RE | Admit: 2021-01-21 | Discharge: 2021-01-21 | Disposition: A | Discharge status: Home / Self Care | Payer: PPO | Source: Ambulatory Visit | Attending: Cardiovascular Disease | Admitting: Cardiovascular Disease

## 2021-01-21 ENCOUNTER — Encounter: Payer: Self-pay | Admitting: Internal Medicine

## 2021-01-21 ENCOUNTER — Telehealth: Payer: Self-pay | Admitting: Internal Medicine

## 2021-01-21 DIAGNOSIS — M79651 Pain in right thigh: Secondary | ICD-10-CM

## 2021-01-21 NOTE — Telephone Encounter (Signed)
Patient called and was wondering if the order has been placed for her back lower leg to have an ultrasound. Please call her at [phone].

## 2021-01-21 NOTE — Telephone Encounter (Signed)
Patient called and was wondering if an order can be placed to have an ultrasound done of her back left lower leg. She said that she is having pain in that leg as well. Please call the patient if the order is placed [phone].

## 2021-01-21 NOTE — Telephone Encounter (Signed)
During her visit yesterday (01/20/2021) she didn't mention anything to me about wanting a ultrasound of lower leg, only her thigh.

## 2021-01-22 DIAGNOSIS — M79651 Pain in right thigh: Secondary | ICD-10-CM | POA: Insufficient documentation

## 2021-01-22 NOTE — Telephone Encounter (Signed)
We ordered US to rule out blood clot as discussed and they check both legs for this. It is not truly to discover the source of the pain but to rule out blood clot which it was able to do. She did not have any blood clots in the legs.

## 2021-01-22 NOTE — Assessment & Plan Note (Signed)
Continue nexium BID for symptoms as she seems to be improving slightly.

## 2021-01-22 NOTE — Assessment & Plan Note (Signed)
Ordered US to rule out DVT although this is a lower suspicion. Baclofen is working well for pain and advised to continue for now.

## 2021-01-22 NOTE — Assessment & Plan Note (Signed)
Referral to neurology to discuss tinnitus.

## 2021-01-23 NOTE — Telephone Encounter (Signed)
Patient mentioned that she has been taking the Meloxicam and her pain has gotten better.

## 2021-01-30 ENCOUNTER — Telehealth: Payer: Self-pay | Admitting: Internal Medicine

## 2021-01-30 NOTE — Progress Notes (Signed)
  Chronic Care Management   Outreach Note  01/30/2021 Name: Gabrielle [name] MRN: [id number] DOB: 1951/11/27  Referred by: [name], MD Reason for referral : No chief complaint on file.   An unsuccessful telephone outreach was attempted today. The patient was referred to the pharmacist for assistance with care management and care coordination.   Follow Up Plan:   Carley Perdue [employer] Scheduler

## 2021-02-03 ENCOUNTER — Telehealth: Payer: Self-pay | Admitting: Internal Medicine

## 2021-02-03 DIAGNOSIS — H26491 Other secondary cataract, right eye: Secondary | ICD-10-CM | POA: Diagnosis not present

## 2021-02-03 DIAGNOSIS — H16223 Keratoconjunctivitis sicca, not specified as Sjogren's, bilateral: Secondary | ICD-10-CM | POA: Diagnosis not present

## 2021-02-03 DIAGNOSIS — D3132 Benign neoplasm of left choroid: Secondary | ICD-10-CM | POA: Diagnosis not present

## 2021-02-03 DIAGNOSIS — H47012 Ischemic optic neuropathy, left eye: Secondary | ICD-10-CM | POA: Diagnosis not present

## 2021-02-03 DIAGNOSIS — H43811 Vitreous degeneration, right eye: Secondary | ICD-10-CM | POA: Diagnosis not present

## 2021-02-03 DIAGNOSIS — H11823 Conjunctivochalasis, bilateral: Secondary | ICD-10-CM | POA: Diagnosis not present

## 2021-02-03 DIAGNOSIS — H0288B Meibomian gland dysfunction left eye, upper and lower eyelids: Secondary | ICD-10-CM | POA: Diagnosis not present

## 2021-02-03 DIAGNOSIS — Z961 Presence of intraocular lens: Secondary | ICD-10-CM | POA: Diagnosis not present

## 2021-02-03 DIAGNOSIS — Z9889 Other specified postprocedural states: Secondary | ICD-10-CM | POA: Diagnosis not present

## 2021-02-03 DIAGNOSIS — H0288A Meibomian gland dysfunction right eye, upper and lower eyelids: Secondary | ICD-10-CM | POA: Diagnosis not present

## 2021-02-03 DIAGNOSIS — H40053 Ocular hypertension, bilateral: Secondary | ICD-10-CM | POA: Diagnosis not present

## 2021-02-03 NOTE — Progress Notes (Signed)
  Chronic Care Management   Note  02/03/2021 Name: Gabrielle [name] MRN: [id number] DOB: July 17, 1951  Gabrielle [name] is a 70 y.o. year old female who is a primary care patient of Sharlet Salina Real Cons, MD. I reached out to Silvio Clayman by phone today in response to a referral sent by [name] PCP, [name], MD.   [name] was given information about Chronic Care Management services today including:  1. CCM service includes personalized support from designated clinical staff supervised by her physician, including individualized plan of care and coordination with other care providers 2. 24/7 contact phone numbers for assistance for urgent and routine care needs. 3. Service will only be billed when office clinical staff spend 20 minutes or more in a month to coordinate care. 4. Only one practitioner may furnish and bill the service in a calendar month. 5. The patient may stop CCM services at any time (effective at the end of the month) by phone call to the office staff.   Patient agreed to services and verbal consent obtained.   Follow up plan:   Carley Perdue [employer] Scheduler
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
## 2021-02-16 ENCOUNTER — Ambulatory Visit: Payer: PPO | Admitting: Neurology

## 2021-02-16 ENCOUNTER — Encounter: Payer: Self-pay | Admitting: Neurology

## 2021-02-16 ENCOUNTER — Other Ambulatory Visit: Payer: Self-pay

## 2021-02-16 VITALS — BP 143/76 | HR 77 | Ht 65.0 in | Wt 222.0 lb

## 2021-02-16 DIAGNOSIS — H9313 Tinnitus, bilateral: Secondary | ICD-10-CM

## 2021-02-16 DIAGNOSIS — H93A9 Pulsatile tinnitus, unspecified ear: Secondary | ICD-10-CM | POA: Diagnosis not present

## 2021-02-16 NOTE — Patient Instructions (Signed)
Call [facility] on [facility] and find out how long MRI of brain and MRA of head without contrast would last.  Contact me if you would like to schedule.

## 2021-02-17 ENCOUNTER — Telehealth: Payer: Self-pay | Admitting: Neurology

## 2021-02-17 NOTE — Telephone Encounter (Signed)
From a neurologic perspective, and for the reason she has seen me, I do not see any indication for a PET scan.

## 2021-02-17 NOTE — Telephone Encounter (Signed)
Pt advised.

## 2021-02-17 NOTE — Telephone Encounter (Signed)
Please advise, Pt wanted to know if she could ahave a PET Scan?

## 2021-02-23 DIAGNOSIS — H6123 Impacted cerumen, bilateral: Secondary | ICD-10-CM | POA: Diagnosis not present

## 2021-02-24 ENCOUNTER — Ambulatory Visit (INDEPENDENT_AMBULATORY_CARE_PROVIDER_SITE_OTHER): Payer: PPO

## 2021-02-24 DIAGNOSIS — Z0000 Encounter for general adult medical examination without abnormal findings: Secondary | ICD-10-CM | POA: Diagnosis not present

## 2021-02-24 NOTE — Progress Notes (Signed)
I connected with Cydnee Fuquay today by telephone and verified that I am speaking with the correct person using two identifiers. Location patient: home Location provider: work Persons participating in the virtual visit: Tane Biegler and Lisette Abu, LPN.   I discussed the limitations, risks, security and privacy concerns of performing an evaluation and management service by telephone and the availability of in person appointments. I also discussed with the patient that there may be a patient responsible charge related to this service. The patient expressed understanding and verbally consented to this telephonic visit.    Interactive audio and video telecommunications were attempted between this provider and patient, however failed, due to patient having technical difficulties OR patient did not have access to video capability.  We continued and completed visit with audio only.  Some vital signs may be absent or patient reported.   Time Spent with patient on telephone encounter: 30 minutes  Subjective:   Gabrielle [name] is a 70 y.o. female who presents for Medicare Annual (Subsequent) preventive examination.  Review of Systems    No ROS. Medicare Wellness Visit. Additional risk factors are reflected in social history. Cardiac Risk Factors include: advanced age (>26men, >58 women);hypertension;obesity (BMI >30kg/m2) Sleep Patterns: Issues with sleep, sleeps 5-6 hours nightly. Home Safety/Smoke Alarms: Feels safe in home; uses home alarm. Smoke alarms in place. Living environment: 1-story home; Lives alone; no needs for DME; good support system. Seat Belt Safety/Bike Helmet: Wears seat belt.    Objective:    There were no vitals filed for this visit. There is no height or weight on file to calculate BMI.  Advanced Directives 02/24/2021 02/16/2021 05/01/2020 10/13/2019 10/10/2019 08/27/2019 11/07/2018  Does Patient Have a Medical Advance Directive? Yes No No No No Yes Yes  Type of  Advance Directive [place]  Does patient want to make changes to medical advance directive? - - - - - - No - Patient declined  Copy of [place] in Chart? No - copy requested - - - - - No - copy requested  Would patient like information on creating a medical advance directive? - - - No - Patient declined Yes (ED - Information included in AVS) - -  Pre-existing out of facility DNR order (yellow form or pink MOST form) - - - - - - -    Current Medications (verified) Outpatient Encounter Medications as of 02/24/2021  Medication Sig  . ALPRAZolam (XANAX) 1 MG tablet Take 1 tablet (1 mg total) by mouth 3 (three) times daily.  . baclofen (LIORESAL) 10 MG tablet Take 10 mg by mouth in the morning and at bedtime.  . carboxymethylcellulose (REFRESH PLUS) 0.5 % SOLN 1 drop 3 (three) times daily as needed. Uses kind with no preservatives Only can see in R eye  . cholecalciferol (VITAMIN D) 1000 UNITS tablet Take 1,000 Units by mouth daily.   Marland Kitchen esomeprazole (NEXIUM) 40 MG capsule Take 1 capsule (40 mg total) by mouth 2 (two) times daily before a meal.  . Multiple Vitamins-Minerals (MULTIVITAMIN WITH MINERALS) tablet Take 1 tablet by mouth daily.  . polyethylene glycol (MIRALAX / GLYCOLAX) 17 g packet Take 17 g by mouth daily as needed.   . sucralfate (CARAFATE) 1 GM/10ML suspension Take 10 mLs (1 g total) by mouth 4 (four) times daily -  with meals and at bedtime.  Marland Kitchen zolpidem (AMBIEN) 10 MG tablet TAKE 1 TO  1 & 1/2 TABLET AT BEDTIME AS NEEDED FOR SLEEP   No facility-administered encounter medications on file as of 02/24/2021.    Allergies (verified) Iodinated diagnostic agents, Other, Shellfish allergy, Gadolinium derivatives, Hydrocodone, Metronidazole, Acetazolamide, Gabapentin, and Mirtazapine   History: Past Medical History:  Diagnosis Date  . Anemia    as a child  . Anxiety    takes Xanax  daily  . Arthritis   . Cataracts, bilateral    immature  . Chronic insomnia 10/11/2014  . Complication of anesthesia   . Constipation    will occasionally take Milk of Mag  . Diverticulosis   . Dizziness    rarely  . GERD (gastroesophageal reflux disease)    takes Omeprazole every other day  . Glaucoma    borderline and no drops required  . Hearing loss    but doesn't have hearing aids  . History of bronchitis    many many yrs ago  . History of colitis   . History of colon polyps   . Hypertension    hasn't been on meds for the past 37yrs   . Insomnia    takes Trazodone nightly as needed and Ambien nightly   . 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 Joint pain   . Joint swelling   . PONV (postoperative nausea and vomiting)   . Tinnitus    takes HCTZ daily to decrease pressure in ears   Past Surgical History:  Procedure Laterality Date  . ABDOMINAL HYSTERECTOMY    . APPENDECTOMY    . COLONOSCOPY    . IR RADIOLOGIST EVAL & MGMT  06/08/2017  . tinnitus Right   . TONSILLECTOMY    . TOTAL KNEE ARTHROPLASTY Right 03/13/2014   DR MURPHY  . TOTAL KNEE ARTHROPLASTY Right 03/13/2014   Procedure: TOTAL KNEE ARTHROPLASTY;  Surgeon: [name], MD;  Location: [place];  Service: Orthopedics;  Laterality: Right;  . TUBAL LIGATION     Family History  Problem Relation Age of Onset  . Depression Mother   . Cancer - Other Father   . Kidney disease Father   . Cancer - Other Sister        breast ca  . Cancer - Other Brother        lymphoma in remission  . Lymphoma Brother   . Cancer - Other Sister        in remission breast ca   Social History   Socioeconomic History  . Marital status: Divorced    Spouse name: Not on file  . Number of children: 2  . Years of education: Not on file  . Highest education level: Not on file  Occupational History  . Occupation: retired  Tobacco Use  . Smoking status: Former Smoker    Packs/day: 1.00    Years: 22.00    Pack years: 22.00     Types: Cigarettes    Start date: 03/08/1972    Quit date: 01/08/1994    Years since quitting: 27.1  . Smokeless tobacco: Never Used  . Tobacco comment: quit smoking 80yrs ago  Vaping Use  . Vaping Use: Never used  Substance and Sexual Activity  . Alcohol use: No    Alcohol/week: 0.0 standard drinks  . Drug use: No  . Sexual activity: Not Currently    Birth control/protection: Surgical  Other Topics Concern  . Not on file  Social History Narrative   Right handed, Caffeine none, Divorced, 2 kids,  PT - Housing auth  in [place],   13.5 yrs school; one level home   Social Determinants of Health   Financial Resource Strain: Not on file  Food Insecurity: Not on file  Transportation Needs: Not on file  Physical Activity: Not on file  Stress: Not on file  Social Connections: Not on file    Tobacco Counseling Counseling given: Not Answered Comment: quit smoking 75yrs ago   Clinical Intake:  Pre-visit preparation completed: Yes  Pain : No/denies pain     Nutritional Risks: None Diabetes: No  How often do you need to have someone help you when you read instructions, pamphlets, or other written materials from your doctor or pharmacy?: 1 - Never What is the last grade level you completed in school?: High School Graduate  Diabetic? no  Interpreter Needed?: No  Information entered by :: Lisette Abu, LPN   Activities of Daily Living In your present state of health, do you have any difficulty performing the following activities: 02/24/2021 01/20/2021  Hearing? Y N  Vision? Y N  Difficulty concentrating or making decisions? N N  Walking or climbing stairs? N N  Dressing or bathing? N N  Doing errands, shopping? Y N  Preparing Food and eating ? N -  Using the Toilet? N -  In the past six months, have you accidently leaked urine? N -  Do you have problems with loss of bowel control? N -  Managing your Medications? N -  Managing your Finances? N -  Housekeeping or  managing your Housekeeping? N -  Some recent data might be hidden    Patient Care Team: [name], MD as PCP - General (Internal Medicine) Pieter Partridge, DO as Consulting Physician (Neurology) Pieter Partridge, DO as Consulting Physician (Neurology) Warden Fillers, MD as Consulting Physician (Ophthalmology) Barry Brunner III, MD as Referring Physician (Ophthalmology) Charlton Haws, [facility] as Pharmacist (Pharmacist)  Indicate any recent Medical Services you may have received from other than [facility] providers in the past year (date may be approximate).     Assessment:   This is a routine wellness examination for Leia.  Hearing/Vision screen No exam data present  Dietary issues and exercise activities discussed: Current Exercise Habits: The patient does not participate in regular exercise at present, Exercise limited by: psychological condition(s)  Goals    . Patient Stated     Maintain current health status.       Depression Screen PHQ 2/9 Scores 02/24/2021 11/10/2020 10/10/2019 11/07/2018 10/06/2018 09/18/2018 09/14/2018  PHQ - 2 Score 3 2 3 6 6 6 6   PHQ- 9 Score - 7 9 14 16 20 20   Exception Documentation - - - - - - -    Fall Risk Fall Risk  02/24/2021 02/16/2021 11/10/2020 10/10/2019 08/27/2019  Falls in the past year? 0 0 0 0 1  Number falls in past yr: 0 0 0 0 1  Injury with Fall? 0 0 0 0 0  Risk for fall due to : No Fall Risks - - - -  Follow up - - - - -    [place]:  Any stairs in or around the home? No  If so, are there any without handrails? No  Home free of loose throw rugs in walkways, pet beds, electrical cords, etc? Yes  Adequate lighting in your home to reduce risk of falls? Yes   ASSISTIVE DEVICES UTILIZED TO PREVENT FALLS:  Life alert? Yes  Use of a  cane, walker or w/c? No  Grab bars in the bathroom? Yes  Shower chair or bench in shower? No  Elevated toilet seat or a handicapped toilet? Yes    TIMED UP AND GO:  Was the test performed? No .  Length of time to ambulate 10 feet: 0 sec.   Gait steady and fast without use of assistive device  Cognitive Function: No flowsheet data found.         Immunizations Immunization History  Administered Date(s) Administered  . Influenza, High Dose Seasonal PF 11/28/2018  . Influenza,inj,Quad PF,6+ Mos 09/27/2019  . Influenza-Unspecified 09/26/2016, 09/18/2017, 09/26/2020  . PFIZER(Purple Top)SARS-COV-2 Vaccination 03/27/2020, 04/22/2020, 10/23/2020    TDAP status: Due, Education has been provided regarding the importance of this vaccine. Advised may receive this vaccine at local pharmacy or Health Dept. Aware to provide a copy of the vaccination record if obtained from local pharmacy or Health Dept. Verbalized acceptance and understanding.  Flu Vaccine status: Up to date  Pneumococcal vaccine status: Declined,  Education has been provided regarding the importance of this vaccine but patient still declined. Advised may receive this vaccine at local pharmacy or Health Dept. Aware to provide a copy of the vaccination record if obtained from local pharmacy or Health Dept. Verbalized acceptance and understanding.   Covid-19 vaccine status: Completed vaccines  Qualifies for Shingles Vaccine? Yes   Zostavax completed No   Shingrix Completed?: No.    Education has been provided regarding the importance of this vaccine. Patient has been advised to call insurance company to determine out of pocket expense if they have not yet received this vaccine. Advised may also receive vaccine at local pharmacy or Health Dept. Verbalized acceptance and understanding.  Screening Tests Health Maintenance  Topic Date Due  . Hepatitis C Screening  Never done  . PNA vac Low Risk Adult (1 of 2 - PCV13) Never done  . MAMMOGRAM  05/13/2021  . TETANUS/TDAP  12/28/2023  . COLONOSCOPY (Pts 45-24yrs Insurance coverage will need to be confirmed)  10/30/2028  .  INFLUENZA VACCINE  Completed  . DEXA SCAN  Completed  . COVID-19 Vaccine  Completed  . HPV VACCINES  Aged Out    Health Maintenance  Health Maintenance Due  Topic Date Due  . Hepatitis C Screening  Never done  . PNA vac Low Risk Adult (1 of 2 - PCV13) Never done    Colorectal cancer screening: Type of screening: Colonoscopy. Completed 10/30/2018. Repeat every 5 years  Mammogram status: Completed 06/10/2020. Repeat every year  Bone Density status: Completed 08/14/2019. Results reflect: Bone density results: OSTEOPENIA. Repeat every 2 years.  Lung Cancer Screening: (Low Dose CT Chest recommended if Age 28-80 years, 30 pack-year currently smoking OR have quit w/in 15years.) does qualify.   Lung Cancer Screening Referral: no  Additional Screening:  Hepatitis C Screening: does qualify; Completed no  Vision Screening: Recommended annual ophthalmology exams for early detection of glaucoma and other disorders of the eye. Is the patient up to date with their annual eye exam?  Yes  Who is the provider or what is the name of the office in which the patient attends annual eye exams? Warden Fillers, MD. and Deloria Lair, MD. If pt is not established with a provider, would they like to be referred to a provider to establish care? No .   Dental Screening: Recommended annual dental exams for proper oral hygiene  Community Resource Referral / Chronic Care Management: CRR required this visit?  No  CCM required this visit?  No      Plan:     I have personally reviewed and noted the following in the patient's chart:   . Medical and social history . Use of alcohol, tobacco or illicit drugs  . Current medications and supplements . Functional ability and status . Nutritional status . Physical activity . Advanced directives . List of other physicians . Hospitalizations, surgeries, and ER visits in previous 12 months . Vitals . Screenings to include cognitive, depression, and  falls . Referrals and appointments  In addition, I have reviewed and discussed with patient certain preventive protocols, quality metrics, and best practice recommendations. A written personalized care plan for preventive services as well as general preventive health recommendations were provided to patient.     Sheral Flow, LPN   04/26/7000   Nurse Notes:  Patient is cogitatively intact. There were no vitals filed for this visit. There is no height or weight on file to calculate BMI. Patient stated that she has no issues with gait or balance; does not use any assistive devices. Medications reviewed with patient; no opioid use noted.

## 2021-02-24 NOTE — Patient Instructions (Signed)
Gabrielle [name] , Thank you for taking time to come for your Medicare Wellness Visit. I appreciate your ongoing commitment to your health goals. Please review the following plan we discussed and let me know if I can assist you in the future.   Screening recommendations/referrals: Colonoscopy: 10/30/2018; due every 5 years Mammogram: 06/10/2020 Bone Density: 08/14/2019; due every 2 years Recommended yearly ophthalmology/optometry visit for glaucoma screening and checkup Recommended yearly dental visit for hygiene and checkup  Vaccinations: Influenza vaccine: 09/26/2020 Pneumococcal vaccine: declined Tdap vaccine: declined Shingles vaccine: never done   Covid-19: 03/27/2020, 04/22/2020, 10/23/2020  Advanced directives: Please bring a copy of your health care power of attorney and living will to the office at your convenience.  Conditions/risks identified: Yes; Reviewed health maintenance screenings with patient today and relevant education, vaccines, and/or referrals were provided. Please continue to do your personal lifestyle choices by: daily care of teeth and gums, regular physical activity (goal should be 5 days a week for 30 minutes), eat a healthy diet, avoid tobacco and drug use, limiting any alcohol intake, taking a low-dose aspirin (if not allergic or have been advised by your provider otherwise) and taking vitamins and minerals as recommended by your provider. Continue doing brain stimulating activities (puzzles, reading, adult coloring books, staying active) to keep memory sharp. Continue to eat heart healthy diet (full of fruits, vegetables, whole grains, lean protein, water--limit salt, fat, and sugar intake) and increase physical activity as tolerated.  Next appointment: Please schedule your next Medicare Wellness Visit with your Nurse Health Advisor in 1 year by calling [phone].   Preventive Care 75 Years and Older, Female Preventive care refers to lifestyle choices and visits with  your health care provider that can promote health and wellness. What does preventive care include?  A yearly physical exam. This is also called an annual well check.  Dental exams once or twice a year.  Routine eye exams. Ask your health care provider how often you should have your eyes checked.  Personal lifestyle choices, including:  Daily care of your teeth and gums.  Regular physical activity.  Eating a healthy diet.  Avoiding tobacco and drug use.  Limiting alcohol use.  Practicing safe sex.  Taking low-dose aspirin every day.  Taking vitamin and mineral supplements as recommended by your health care provider. What happens during an annual well check? The services and screenings done by your health care provider during your annual well check will depend on your age, overall health, lifestyle risk factors, and family history of disease. Counseling  Your health care provider may ask you questions about your:  Alcohol use.  Tobacco use.  Drug use.  Emotional well-being.  Home and relationship well-being.  Sexual activity.  Eating habits.  History of falls.  Memory and ability to understand (cognition).  Work and work Statistician.  Reproductive health. Screening  You may have the following tests or measurements:  Height, weight, and BMI.  Blood pressure.  Lipid and cholesterol levels. These may be checked every 5 years, or more frequently if you are over 69 years old.  Skin check.  Lung cancer screening. You may have this screening every year starting at age 64 if you have a 30-pack-year history of smoking and currently smoke or have quit within the past 15 years.  Fecal occult blood test (FOBT) of the stool. You may have this test every year starting at age 74.  Flexible sigmoidoscopy or colonoscopy. You may have a sigmoidoscopy every 5 years or  a colonoscopy every 10 years starting at age 34.  Hepatitis C blood test.  Hepatitis B blood  test.  Sexually transmitted disease (STD) testing.  Diabetes screening. This is done by checking your blood sugar (glucose) after you have not eaten for a while (fasting). You may have this done every 1-3 years.  Bone density scan. This is done to screen for osteoporosis. You may have this done starting at age 57.  Mammogram. This may be done every 1-2 years. Talk to your health care provider about how often you should have regular mammograms. Talk with your health care provider about your test results, treatment options, and if necessary, the need for more tests. Vaccines  Your health care provider may recommend certain vaccines, such as:  Influenza vaccine. This is recommended every year.  Tetanus, diphtheria, and acellular pertussis (Tdap, Td) vaccine. You may need a Td booster every 10 years.  Zoster vaccine. You may need this after age 45.  Pneumococcal 13-valent conjugate (PCV13) vaccine. One dose is recommended after age 57.  Pneumococcal polysaccharide (PPSV23) vaccine. One dose is recommended after age 61. Talk to your health care provider about which screenings and vaccines you need and how often you need them. This information is not intended to replace advice given to you by your health care provider. Make sure you discuss any questions you have with your health care provider. Document Released: 01/09/2016 Document Revised: 09/01/2016 Document Reviewed: 10/14/2015 Elsevier Interactive Patient Education  2017 [place] Prevention in the Home Falls can cause injuries. They can happen to people of all ages. There are many things you can do to make your home safe and to help prevent falls. What can I do on the outside of my home?  Regularly fix the edges of walkways and driveways and fix any cracks.  Remove anything that might make you trip as you walk through a door, such as a raised step or threshold.  Trim any bushes or trees on the path to your home.  Use  bright outdoor lighting.  Clear any walking paths of anything that might make someone trip, such as rocks or tools.  Regularly check to see if handrails are loose or broken. Make sure that both sides of any steps have handrails.  Any raised decks and porches should have guardrails on the edges.  Have any leaves, snow, or ice cleared regularly.  Use sand or salt on walking paths during winter.  Clean up any spills in your garage right away. This includes oil or grease spills. What can I do in the bathroom?  Use night lights.  Install grab bars by the toilet and in the tub and shower. Do not use towel bars as grab bars.  Use non-skid mats or decals in the tub or shower.  If you need to sit down in the shower, use a plastic, non-slip stool.  Keep the floor dry. Clean up any water that spills on the floor as soon as it happens.  Remove soap buildup in the tub or shower regularly.  Attach bath mats securely with double-sided non-slip rug tape.  Do not have throw rugs and other things on the floor that can make you trip. What can I do in the bedroom?  Use night lights.  Make sure that you have a light by your bed that is easy to reach.  Do not use any sheets or blankets that are too big for your bed. They should not hang down onto  the floor.  Have a firm chair that has side arms. You can use this for support while you get dressed.  Do not have throw rugs and other things on the floor that can make you trip. What can I do in the kitchen?  Clean up any spills right away.  Avoid walking on wet floors.  Keep items that you use a lot in easy-to-reach places.  If you need to reach something above you, use a strong step stool that has a grab bar.  Keep electrical cords out of the way.  Do not use floor polish or wax that makes floors slippery. If you must use wax, use non-skid floor wax.  Do not have throw rugs and other things on the floor that can make you trip. What can  I do with my stairs?  Do not leave any items on the stairs.  Make sure that there are handrails on both sides of the stairs and use them. Fix handrails that are broken or loose. Make sure that handrails are as long as the stairways.  Check any carpeting to make sure that it is firmly attached to the stairs. Fix any carpet that is loose or worn.  Avoid having throw rugs at the top or bottom of the stairs. If you do have throw rugs, attach them to the floor with carpet tape.  Make sure that you have a light switch at the top of the stairs and the bottom of the stairs. If you do not have them, ask someone to add them for you. What else can I do to help prevent falls?  Wear shoes that:  Do not have high heels.  Have rubber bottoms.  Are comfortable and fit you well.  Are closed at the toe. Do not wear sandals.  If you use a stepladder:  Make sure that it is fully opened. Do not climb a closed stepladder.  Make sure that both sides of the stepladder are locked into place.  Ask someone to hold it for you, if possible.  Clearly mark and make sure that you can see:  Any grab bars or handrails.  First and last steps.  Where the edge of each step is.  Use tools that help you move around (mobility aids) if they are needed. These include:  Canes.  Walkers.  Scooters.  Crutches.  Turn on the lights when you go into a dark area. Replace any light bulbs as soon as they burn out.  Set up your furniture so you have a clear path. Avoid moving your furniture around.  If any of your floors are uneven, fix them.  If there are any pets around you, be aware of where they are.  Review your medicines with your doctor. Some medicines can make you feel dizzy. This can increase your chance of falling. Ask your doctor what other things that you can do to help prevent falls. This information is not intended to replace advice given to you by your health care provider. Make sure you  discuss any questions you have with your health care provider. Document Released: 10/09/2009 Document Revised: 05/20/2016 Document Reviewed: 01/17/2015 Elsevier Interactive Patient Education  2017 [employer].

## 2021-02-25 ENCOUNTER — Ambulatory Visit (INDEPENDENT_AMBULATORY_CARE_PROVIDER_SITE_OTHER): Payer: PPO | Admitting: Ophthalmology

## 2021-02-25 ENCOUNTER — Encounter (INDEPENDENT_AMBULATORY_CARE_PROVIDER_SITE_OTHER): Payer: Self-pay | Admitting: Ophthalmology

## 2021-02-25 ENCOUNTER — Other Ambulatory Visit: Payer: Self-pay

## 2021-02-25 DIAGNOSIS — H43311 Vitreous membranes and strands, right eye: Secondary | ICD-10-CM | POA: Diagnosis not present

## 2021-02-25 DIAGNOSIS — H4720 Unspecified optic atrophy: Secondary | ICD-10-CM | POA: Diagnosis not present

## 2021-02-25 DIAGNOSIS — Z961 Presence of intraocular lens: Secondary | ICD-10-CM | POA: Insufficient documentation

## 2021-02-25 DIAGNOSIS — H43813 Vitreous degeneration, bilateral: Secondary | ICD-10-CM | POA: Insufficient documentation

## 2021-02-25 NOTE — Progress Notes (Signed)
02/25/2021     CHIEF COMPLAINT Patient presents for Flashes/floaters (WIP Floaters OD s/p YAG with Dr. Georganna Skeans c/o continued floaters OD x 3 weeks following YAG laser. Pt denies any improvement in floaters, and sts they have not gone away. Pt c/o "bumps" on lower eyelid and intermittent swelling OS x 1 year approx. Pt wants to know if she has film over her lens OS./)   HISTORY OF PRESENT ILLNESS: Gabrielle [name] is a 70 y.o. female who presents to the clinic today for:   HPI    Flashes/floaters    In right eye.  This started 3 weeks ago.  Duration of 3 weeks.  Duration Constant.  Characterized as small and spots.  Since onset it is stable.  Associated Symptoms Floaters.  Context: recent eye surgery.  Treatments tried include no treatments.  Response to treatment was no improvement. Additional comments: WIP Floaters OD s/p YAG with Dr. [name]  Pt c/o continued floaters OD x 3 weeks following YAG laser. Pt denies any improvement in floaters, and sts they have not gone away. Pt c/o "bumps" on lower eyelid and intermittent swelling OS x 1 year approx. Pt wants to know if she has film over her lens OS.        Last edited by [name], MD on 02/25/2021 11:21 AM. (History)      Referring physician: [name], MD [place],   [id number]  HISTORICAL INFORMATION:   Selected notes from the MEDICAL RECORD NUMBER    Lab Results  Component Value Date   HGBA1C 5.1 10/06/2018     CURRENT MEDICATIONS: Current Outpatient Medications (Ophthalmic Drugs)  Medication Sig  . carboxymethylcellulose (REFRESH PLUS) 0.5 % SOLN 1 drop 3 (three) times daily as needed. Uses kind with no preservatives Only can see in R eye   No current facility-administered medications for this visit. (Ophthalmic Drugs)   Current Outpatient Medications (Other)  Medication Sig  . ALPRAZolam (XANAX) 1 MG tablet Take 1 tablet (1 mg total) by mouth 3 (three) times daily.  . baclofen  (LIORESAL) 10 MG tablet Take 10 mg by mouth in the morning and at bedtime.  . cholecalciferol (VITAMIN D) 1000 UNITS tablet Take 1,000 Units by mouth daily.   Marland Kitchen esomeprazole (NEXIUM) 40 MG capsule Take 1 capsule (40 mg total) by mouth 2 (two) times daily before a meal.  . Multiple Vitamins-Minerals (MULTIVITAMIN WITH MINERALS) tablet Take 1 tablet by mouth daily.  . polyethylene glycol (MIRALAX / GLYCOLAX) 17 g packet Take 17 g by mouth daily as needed.   . sucralfate (CARAFATE) 1 GM/10ML suspension Take 10 mLs (1 g total) by mouth 4 (four) times daily -  with meals and at bedtime.  Marland Kitchen zolpidem (AMBIEN) 10 MG tablet TAKE 1 TO 1 & 1/2 TABLET AT BEDTIME AS NEEDED FOR SLEEP   No current facility-administered medications for this visit. (Other)      REVIEW OF SYSTEMS:    ALLERGIES Allergies  Allergen Reactions  . Iodinated Diagnostic Agents Hives  . Other Hives, Other (See Comments) and Rash  . Shellfish Allergy Hives, Other (See Comments) and Rash  . Gadolinium Derivatives Hives  . Hydrocodone Other (See Comments)    insomnia  . Metronidazole Nausea And Vomiting and Other (See Comments)  . Acetazolamide Anxiety    Makes head feel strange  . Gabapentin Anxiety    unknown  . Mirtazapine Other (See Comments)    Dry eyes  PAST MEDICAL HISTORY Past Medical History:  Diagnosis Date  . Anemia    as a child  . Anxiety    takes Xanax daily  . Arthritis   . Cataracts, bilateral    immature  . Chronic insomnia 10/11/2014  . Complication of anesthesia   . Constipation    will occasionally take Milk of Mag  . Diverticulosis   . Dizziness    rarely  . GERD (gastroesophageal reflux disease)    takes Omeprazole every other day  . Glaucoma    borderline and no drops required  . Hearing loss    but doesn't have hearing aids  . History of bronchitis    many many yrs ago  . History of colitis   . History of colon polyps   . Hypertension    hasn't been on meds for the past  20yrs   . Insomnia    takes Trazodone nightly as needed and Ambien nightly   . 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 Joint pain   . Joint swelling   . PONV (postoperative nausea and vomiting)   . Tinnitus    takes HCTZ daily to decrease pressure in ears   Past Surgical History:  Procedure Laterality Date  . ABDOMINAL HYSTERECTOMY    . APPENDECTOMY    . COLONOSCOPY    . IR RADIOLOGIST EVAL & MGMT  06/08/2017  . tinnitus Right   . TONSILLECTOMY    . TOTAL KNEE ARTHROPLASTY Right 03/13/2014   DR MURPHY  . TOTAL KNEE ARTHROPLASTY Right 03/13/2014   Procedure: TOTAL KNEE ARTHROPLASTY;  Surgeon: [name], MD;  Location: [facility];  Service: Orthopedics;  Laterality: Right;  . TUBAL LIGATION      FAMILY HISTORY Family History  Problem Relation Age of Onset  . Depression Mother   . Cancer - Other Father   . Kidney disease Father   . Cancer - Other Sister        breast ca  . Cancer - Other Brother        lymphoma in remission  . Lymphoma Brother   . Cancer - Other Sister        in remission breast ca    SOCIAL HISTORY Social History   Tobacco Use  . Smoking status: Former Smoker    Packs/day: 1.00    Years: 22.00    Pack years: 22.00    Types: Cigarettes    Start date: 03/08/1972    Quit date: 01/08/1994    Years since quitting: 27.1  . Smokeless tobacco: Never Used  . Tobacco comment: quit smoking 72yrs ago  Vaping Use  . Vaping Use: Never used  Substance Use Topics  . Alcohol use: No    Alcohol/week: 0.0 standard drinks  . Drug use: No         OPHTHALMIC EXAM:  Base Eye Exam    Visual Acuity (ETDRS)      Right Left   Dist cc 20/20 20/200 -1   Dist ph cc  NI   Correction: Glasses       Tonometry (Tonopen, 11:11 AM)      Right Left   Pressure 14 16       Pupils      Dark Light Shape React APD   Right 6 5 Round Brisk None   Left 6 5 Round Brisk +1       Visual Fields (Counting fingers)      Left Right  Full   Restrictions Total  superior temporal, superior nasal deficiencies        Extraocular Movement      Right Left    Full Full       Neuro/Psych    Oriented x3: Yes   Mood/Affect: Normal       Dilation    Both eyes: 1.0% Mydriacyl, 2.5% Phenylephrine @ 11:16 AM        Slit Lamp and Fundus Exam    External Exam      Right Left   External Normal Normal       Slit Lamp Exam      Right Left   Lids/Lashes Normal Normal   Conjunctiva/Sclera White and quiet White and quiet   Cornea Clear Clear   Anterior Chamber Deep and quiet Deep and quiet   Iris Round and reactive Round and reactive   Lens Centered posterior chamber intraocular lens, Open posterior capsule Centered posterior chamber intraocular lens, Clear PC   Anterior Vitreous Normal Normal       Fundus Exam      Right Left   Posterior Vitreous Posterior vitreous detachment, , Central vitreous floaters Normal   Disc Normal 3+ Pallor   C/D Ratio 0.55 0.8   Macula Normal Normal   Vessels Normal Normal   Periphery Normal,,  no holes or tears Normal          IMAGING AND PROCEDURES  Imaging and Procedures for 02/25/21  OCT, Retina - OU - Both Eyes       Right Eye Quality was good. Scan locations included subfoveal. Central Foveal Thickness: 239. Progression has been stable. Findings include normal foveal contour.   Left Eye Quality was good. Scan locations included subfoveal. Central Foveal Thickness: 273. Progression has been stable.   Notes OD normal contour, incidental posterior vitreous detachment, OS with diffuse retinal atrophy from previous AI ON stable                ASSESSMENT/PLAN:  Vitreous membranes and strands, right eye I have recommended patient continue to monitor and watch for improvement in her floaters since she is only now 3 weeks post YAG capsulotomy which can increase the number of floaters on a temporary basis.  Assured the patient in most cases, not all in , floaters after YAG capsulotomy do  subside over time.  But it takes a while up to 6 months typically.  Less the patient says she is currently debilitated and would like follow-up to monitor if this in fact does resolve.      ICD-10-CM   1. Posterior vitreous detachment of both eyes  H43.813 OCT, Retina - OU - Both Eyes  2. Optic nerve atrophy  H47.20   3. Pseudophakia  Z96.1   4. Vitreous membranes and strands, right eye  H43.311     1.  Patient understands the importance of continued monitoring and observation alone to provide time for floaters to subside diminish or of the brain to adjust to them.  2.  3.  Ophthalmic Meds Ordered this visit:  No orders of the defined types were placed in this encounter.      Return in about 10 weeks (around 05/06/2021) for DILATE OU, OCT, COLOR FP.  There are no Patient Instructions on file for this visit.   Explained the diagnoses, plan, and follow up with the patient and they expressed understanding.  Patient expressed understanding of the importance of proper follow up care.   Clent Demark  Akai Dollard M.D. Diseases & Surgery of the Retina and Vitreous [facility] [place] 02/25/21     Abbreviations: M myopia (nearsighted); A astigmatism; H hyperopia (farsighted); P presbyopia; Mrx spectacle prescription;  CTL contact lenses; OD right eye; OS left eye; OU both eyes  XT exotropia; ET esotropia; PEK punctate epithelial keratitis; PEE punctate epithelial erosions; DES dry eye syndrome; MGD meibomian gland dysfunction; ATs artificial tears; PFAT's preservative free artificial tears; [place] nuclear sclerotic cataract; PSC posterior subcapsular cataract; ERM epi-retinal membrane; PVD posterior vitreous detachment; RD retinal detachment; DM diabetes mellitus; DR diabetic retinopathy; NPDR non-proliferative diabetic retinopathy; PDR proliferative diabetic retinopathy; CSME clinically significant macular edema; DME diabetic macular edema; dbh dot blot hemorrhages; CWS cotton wool spot; POAG  primary open angle glaucoma; C/D cup-to-disc ratio; HVF humphrey visual field; GVF goldmann visual field; OCT optical coherence tomography; IOP intraocular pressure; BRVO Branch retinal vein occlusion; CRVO central retinal vein occlusion; CRAO central retinal artery occlusion; BRAO branch retinal artery occlusion; RT retinal tear; SB scleral buckle; PPV pars plana vitrectomy; VH Vitreous hemorrhage; PRP panretinal laser photocoagulation; IVK intravitreal kenalog; VMT vitreomacular traction; MH Macular hole;  NVD neovascularization of the disc; NVE neovascularization elsewhere; AREDS age related eye disease study; ARMD age related macular degeneration; POAG primary open angle glaucoma; EBMD epithelial/anterior basement membrane dystrophy; ACIOL anterior chamber intraocular lens; IOL intraocular lens; PCIOL posterior chamber intraocular lens; Phaco/IOL phacoemulsification with intraocular lens placement; [place] photorefractive keratectomy; LASIK laser assisted in situ keratomileusis; HTN hypertension; DM diabetes mellitus; COPD chronic obstructive pulmonary disease

## 2021-02-25 NOTE — Assessment & Plan Note (Signed)
I have recommended patient continue to monitor and watch for improvement in her floaters since she is only now 3 weeks post YAG capsulotomy which can increase the number of floaters on a temporary basis.  Assured the patient in most cases, not all in , floaters after YAG capsulotomy do subside over time.  But it takes a while up to 6 months typically.  Less the patient says she is currently debilitated and would like follow-up to monitor if this in fact does resolve.

## 2021-03-03 ENCOUNTER — Telehealth: Payer: Self-pay | Admitting: Neurology

## 2021-03-03 NOTE — Telephone Encounter (Signed)
Advised pt of Dr.Jaffe note. Pt will call [facility] to see how long the Mri will take and give [place] a call back if she wants to move forward.

## 2021-03-03 NOTE — Telephone Encounter (Signed)
Patient would like Dr Georgie Chard opinion on just getting the MRA since she has tinnitus she feels like the MRI would be too loud. If Dr Tomi Likens agrees that just MRA would be okay, she would like to go ahead with scheduling it and to please send the referral in for it. Please call patient.

## 2021-03-03 NOTE — Telephone Encounter (Signed)
I would prefer MRI of the brain.  She actually has had imaging of the blood vessels in the head previously (a CTA).  However, she hasn't had an MRI of the brain.

## 2021-03-19 ENCOUNTER — Telehealth: Payer: Self-pay | Admitting: Pharmacist

## 2021-03-19 NOTE — Progress Notes (Signed)
Chronic Care Management Pharmacy Assistant   Name: ADONIA PORADA  MRN: [id number] DOB: 07-18-1951   Reason for Encounter: Initial Questions    Recent office visits:  01/20/21 Dr. Rowe Pavy Medicine-stopped omeprazole  Recent consult visits:  02/16/21 Dr. Tomi Likens Neurology 02/25/21 Dr. [name] [facility] visits:  None in previous 6 months  Medications: Outpatient Encounter Medications as of 03/19/2021  Medication Sig  . ALPRAZolam (XANAX) 1 MG tablet Take 1 tablet (1 mg total) by mouth 3 (three) times daily.  . baclofen (LIORESAL) 10 MG tablet Take 10 mg by mouth in the morning and at bedtime.  . carboxymethylcellulose (REFRESH PLUS) 0.5 % SOLN 1 drop 3 (three) times daily as needed. Uses kind with no preservatives Only can see in R eye  . cholecalciferol (VITAMIN D) 1000 UNITS tablet Take 1,000 Units by mouth daily.   Marland Kitchen esomeprazole (NEXIUM) 40 MG capsule Take 1 capsule (40 mg total) by mouth 2 (two) times daily before a meal.  . Multiple Vitamins-Minerals (MULTIVITAMIN WITH MINERALS) tablet Take 1 tablet by mouth daily.  . polyethylene glycol (MIRALAX / GLYCOLAX) 17 g packet Take 17 g by mouth daily as needed.   . sucralfate (CARAFATE) 1 GM/10ML suspension Take 10 mLs (1 g total) by mouth 4 (four) times daily -  with meals and at bedtime.  Marland Kitchen zolpidem (AMBIEN) 10 MG tablet TAKE 1 TO 1 & 1/2 TABLET AT BEDTIME AS NEEDED FOR SLEEP   No facility-administered encounter medications on file as of 03/19/2021.    Care Gaps: Overdue Hepatitis C Screening Star Rating Drugs: No ACE/ARB   Have you seen any other providers since your last visit?  The patient states that she last seen Dr. Sharlet Salina, Dr. Tomi Likens and Dr. [name]  Any changes in your medications or health?  The patient states that she has not had any changes in medication or health  Any side effects from any medications? The patient states that she does not have any side effects from any medications  Do  you have an symptoms or problems not managed by your medications?  The patient states that she does not have any new symptoms or problems not managed by medications  Any concerns about your health right now?  The patient states that her concern is that no one has been able to help with the tinnitus in ears and head  Has your provider asked that you check blood pressure, blood sugar, or follow special diet at home?  The patient states that she dos not have to check her blood pressure or blood sugar anymore  Do you get any type of exercise on a regular basis?  Did not discuss exercise  Can you think of a goal you would like to reach for your health?  The patient states that her health goal would be to be free from the ringing in her head so that she can live some quality of life  Do you have any problems getting your medications?  The patient states that she does not have any problems with getting medication or cost of medications from the pharmacy  Is there anything that you would like to discuss during the appointment?  The patient states that she does not have anything specific to discuss that she has not discussed with Dr. Sharlet Salina at this time  Patient to receive a phone call for appointment, asked to have medications and supplements at appointment   Wendy Poet, [place] [phone]  Time spent:60 minutes- telephone call, reviewing chart notes, and documentation
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
## 2021-03-20 ENCOUNTER — Other Ambulatory Visit: Payer: Self-pay

## 2021-03-20 ENCOUNTER — Other Ambulatory Visit: Payer: Self-pay | Admitting: Internal Medicine

## 2021-03-20 ENCOUNTER — Ambulatory Visit (INDEPENDENT_AMBULATORY_CARE_PROVIDER_SITE_OTHER): Payer: PPO | Admitting: Pharmacist

## 2021-03-20 DIAGNOSIS — F5104 Psychophysiologic insomnia: Secondary | ICD-10-CM

## 2021-03-20 DIAGNOSIS — H9313 Tinnitus, bilateral: Secondary | ICD-10-CM

## 2021-03-20 DIAGNOSIS — G4700 Insomnia, unspecified: Secondary | ICD-10-CM

## 2021-03-20 DIAGNOSIS — K219 Gastro-esophageal reflux disease without esophagitis: Secondary | ICD-10-CM

## 2021-03-20 DIAGNOSIS — F4312 Post-traumatic stress disorder, chronic: Secondary | ICD-10-CM

## 2021-03-20 DIAGNOSIS — M170 Bilateral primary osteoarthritis of knee: Secondary | ICD-10-CM

## 2021-03-20 DIAGNOSIS — F411 Generalized anxiety disorder: Secondary | ICD-10-CM

## 2021-03-20 MED ORDER — CITALOPRAM HYDROBROMIDE 10 MG PO TABS: 10.0000 mg | ORAL_TABLET | Freq: Every day | ORAL | 3 refills | Status: DC

## 2021-03-23 NOTE — Patient Instructions (Addendum)
Visit Information  Phone number for Pharmacist: [phone]  Thank you for meeting with me to discuss your medications! I look forward to working with you to achieve your health care goals. Below is a summary of what we talked about during the visit:  Goals Addressed            This Visit's Progress   . Manage My Medicine       Timeframe:  Long-Range Goal Priority:  Medium Start Date:      03/23/21                       Expected End Date:     09/23/21                  Follow Up Date 04/25/21   - call for medicine refill 2 or 3 days before it runs out - call if I am sick and can't take my medicine - keep a list of all the medicines I take; vitamins and herbals too  -Take baclofen at night to help with tinnitus -Start citalopram 10 mg - 1/2 tablet daily for 1 week, then increase to whole tablet. Contact pharmacist with any issues.   Why is this important?   . These steps will help you keep on track with your medicines.   Notes:       Patient Care Plan: CCM Pharmacy Care Plan    Problem Identified: Hypertension, GERD, Depression, Anxiety and Pain/Tinnitus   Priority: High    Long-Range Goal: Disease management   Start Date: 03/23/2021  Expected End Date: 09/23/2021  This Visit's Progress: On track  Priority: High  Note:   Current Barriers:  . Unable to independently monitor therapeutic efficacy . Unable to achieve control of tinnitus  . Suboptimal therapeutic regimen for anxiety  Pharmacist Clinical Goal(s):  Marland Kitchen Patient will achieve adherence to monitoring guidelines and medication adherence to achieve therapeutic efficacy . adhere to plan to optimize therapeutic regimen for anxiety as evidenced by report of adherence to recommended medication management changes through collaboration with PharmD and provider.   Interventions: . 1:1 collaboration with [name], MD regarding development and update of comprehensive plan of care as evidenced by provider  attestation and co-signature . Inter-disciplinary care team collaboration (see longitudinal plan of care) . Comprehensive medication review performed; medication list updated in electronic medical record  Hypertension (BP goal <130/80) -Controlled - off medication -Current treatment: . No medication -Medications previously tried: lisinopril, HCTZ  -Denies hypotensive/hypertensive symptoms -Educated on Importance of home blood pressure monitoring; -Counseled to monitor BP at home weekly, document, and provide log at future appointments -Counseled on diet and exercise extensively  Depression/Anxiety (PTSD) (Goal: manage symptoms) -Not ideally controlled - per patient tinnitus is the cause of her anxiety; thorough workup by several providers (ENT, neurology) have not yielded etiology/treatment for tinnitus; she saw a neurologist recently who wants to do an MRI but pt does not think she can handle an MRI > 15 min due to anxiety and noise -pt does not like being "dependent" on pills and wants to get off of alprazolam; she does speak with therapist at the [place] twice a month which is helpful for coping -Accupuncture has helped in the past with tinnitus, but pt is worried about trying this in case it makes tinnitus worse -PHQ9: 7 (10/2020) -GAD7: 19 (08/2018) -Current treatment: . Alprazolam 1 mg TID - usually just 9pm. Takes 1/2 during day w/ bad tinnitus .  Zolpidem 10 mg HS prn  - gets 4-5 hrs of sleep -Medications previously tried/failed: Citalopram (stopped after 3 days after reading side effects, she did not personally experience ADR), buspar, mirtazapine, trazodone -Counseled on Benefits of SSRI; it is possible tinnitus is stemming from mental/emotional distress and SSRI may help stabilize this and allow her to use less alprazolam; pt has never tried an SSRI or other antidepressant for longer than a few days; she agreed to try one now, and would like to try one she has been prescribed  before -Advised patient to contact MRI office and ask how long MRI will take - if < 15 min encouraged patient to make appt and f/u with neurology -Recommended to initiate citalopram 10 mg - 1/2 tablet daily x 1 week then increase to whole tablet. Continue alprazolam while titrating; may consider reducing alprazolam in the future as tolerated  GERD (Goal: manage symptoms) -Controlled - pt reports improvement in symptoms on current regimen; she is not using sucralfate -Current treatment  . Esomeprazole 40 mg BID -Medications previously tried: omeprazole, sucralfate -does not eat pork (7 years) -Recommended to continue current medication  Pain / Tinnitus (Goal: manage symptoms) -Not ideally controlled - pt reports pain has improved overall; she is no longer taking baclofen, she does not like to take pain medication; she did report that tinnitus improved while taking baclofen -DJD of knee, TMJ  -Current treatment  . 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 Vitamin D 1000 IU daily . Multivitamin . Collagen supplement . 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 Patient will:  - take medications as prescribed focus on medication adherence by routine check blood pressure weekly, document, and provide at future appointments  Follow Up Plan: Telephone follow up appointment with care management team member scheduled for: 1 month      Ms. Grefe was given information about Chronic Care Management  services today including:  1. CCM service includes personalized support from designated clinical staff supervised by her physician, including individualized plan of care and coordination with other care providers 2. 24/7 contact phone numbers for assistance for urgent and routine care needs. 3. Standard insurance, coinsurance, copays and deductibles apply for chronic care management only during months in which we provide at least 20 minutes of these services. Most insurances cover these services at 100%, however patients may be responsible for any copay, coinsurance and/or deductible if applicable. This service may help you avoid the need for more expensive face-to-face services. 4. Only one practitioner may furnish and bill the service in a calendar month. 5. The patient may stop CCM services at any time (effective at the end of the month) by phone call to the office staff.  Patient agreed to services and verbal consent obtained.   Patient verbalizes understanding of instructions provided today and agrees to view in [place].  Telephone follow up appointment with pharmacy team member scheduled for: 1 month  Charlene Brooke, PharmD, BCACP Clinical Pharmacist [facility] [phone] Citalopram tablets What is this medicine? CITALOPRAM (sye  TAL oh pram) is a medicine for depression. This medicine may be used for other purposes; ask your health care provider or pharmacist if you have questions. COMMON BRAND NAME(S): Celexa What should I tell my health care provider before I take this medicine? They need to know if you have any of these conditions:  bleeding disorders  bipolar disorder or a family history of bipolar disorder  glaucoma  heart disease  history of irregular heartbeat  kidney disease  liver disease  low levels of magnesium or potassium in the blood  receiving electroconvulsive therapy  seizures  suicidal thoughts, plans, or attempt; a  previous suicide attempt by you or a family member  take medicines that treat or prevent blood clots  thyroid disease  an unusual or allergic reaction to citalopram, escitalopram, other medicines, foods, dyes, or preservatives  pregnant or trying to become pregnant  breast-feeding How should I use this medicine? Take this medicine by mouth with a glass of water. Follow the directions on the prescription label. You can take it with or without food. Take your medicine at regular intervals. Do not take your medicine more often than directed. Do not stop taking this medicine suddenly except upon the advice of your doctor. Stopping this medicine too quickly may cause serious side effects or your condition may worsen. A special MedGuide will be given to you by the pharmacist with each prescription and refill. Be sure to read this information carefully each time. Talk to your pediatrician regarding the use of this medicine in children. Special care may be needed. Patients over 33 years old may have a stronger reaction and need a smaller dose. Overdosage: If you think you have taken too much of this medicine contact a poison control center or emergency room at once. NOTE: This medicine is only for you. Do not share this medicine with others. What if I miss a dose? If you miss a dose, take it as soon as you can. If it is almost time for your next dose, take only that dose. Do not take double or extra doses. What may interact with this medicine? Do not take this medicine with any of the following medications:  certain medicines for fungal infections like fluconazole, itraconazole, ketoconazole, posaconazole, voriconazole  cisapride  dronedarone  escitalopram  linezolid  MAOIs like Carbex, Eldepryl, Marplan, Nardil, and Parnate  methylene blue (injected into a vein)  pimozide  thioridazine This medicine may also interact with the following  medications:  alcohol  amphetamines  aspirin and aspirin-like medicines  carbamazepine  certain medicines for depression, anxiety, or psychotic disturbances  certain medicines for infections like chloroquine, clarithromycin, erythromycin, furazolidone, isoniazid, pentamidine  certain medicines for migraine headaches like almotriptan, eletriptan, frovatriptan, naratriptan, rizatriptan, sumatriptan, zolmitriptan  certain medicines for sleep  certain medicines that treat or prevent blood clots like dalteparin, enoxaparin, warfarin  cimetidine  diuretics  dofetilide  fentanyl  lithium  methadone  metoprolol  NSAIDs, medicines for pain and inflammation, like ibuprofen or naproxen  omeprazole  other medicines that prolong the QT interval (cause an abnormal heart rhythm)  procarbazine  rasagiline  supplements like St. John's wort, kava kava, valerian  tramadol  tryptophan  ziprasidone This list may not describe all possible interactions. Give your health care provider a list of all the medicines, herbs, non-prescription drugs, or dietary supplements you use. Also tell them if you smoke, drink alcohol, or use illegal drugs. Some items may interact with your medicine. What should I watch for  while using this medicine? Tell your doctor if your symptoms do not get better or if they get worse. Visit your doctor or health care professional for regular checks on your progress. Because it may take several weeks to see the full effects of this medicine, it is important to continue your treatment as prescribed by your doctor. Patients and their families should watch out for new or worsening thoughts of suicide or depression. Also watch out for sudden changes in feelings such as feeling anxious, agitated, panicky, irritable, hostile, aggressive, impulsive, severely restless, overly excited and hyperactive, or not being able to sleep. If this happens, especially at the beginning of  treatment or after a change in dose, call your health care professional. Dennis Bast may get drowsy or dizzy. Do not drive, use machinery, or do anything that needs mental alertness until you know how this medicine affects you. Do not stand or sit up quickly, especially if you are an older patient. This reduces the risk of dizzy or fainting spells. Alcohol may interfere with the effect of this medicine. Avoid alcoholic drinks. Your mouth may get dry. Chewing sugarless gum or sucking hard candy, and drinking plenty of water will help. Contact your doctor if the problem does not go away or is severe. What side effects may I notice from receiving this medicine? Side effects that you should report to your doctor or health care professional as soon as possible:  allergic reactions like skin rash, itching or hives, swelling of the face, lips, or tongue  anxious  black, tarry stools  breathing problems  changes in vision  chest pain  confusion  elevated mood, decreased need for sleep, racing thoughts, impulsive behavior  eye pain  fast, irregular heartbeat  feeling faint or lightheaded, falls  feeling agitated, angry, or irritable  hallucination, loss of contact with reality  loss of balance or coordination  loss of memory  painful or prolonged erections  restlessness, pacing, inability to keep still  seizures  stiff muscles  suicidal thoughts or other mood changes  trouble sleeping  unusual bleeding or bruising  unusually weak or tired  vomiting Side effects that usually do not require medical attention (report to your doctor or health care professional if they continue or are bothersome):  change in appetite or weight  change in sex drive or performance  dizziness  headache  increased sweating  indigestion, nausea  tremors This list may not describe all possible side effects. Call your doctor for medical advice about side effects. You may report side effects to  FDA at 1-800-FDA-1088. Where should I keep my medicine? Keep out of reach of children. Store at room temperature between 15 and 30 degrees C (59 and 86 degrees F). Throw away any unused medicine after the expiration date. NOTE: This sheet is a summary. It may not cover all possible information. If you have questions about this medicine, talk to your doctor, pharmacist, or health care provider.  2021 Elsevier/Gold Standard (2018-12-04 09:05:36)

## 2021-03-24 ENCOUNTER — Encounter (INDEPENDENT_AMBULATORY_CARE_PROVIDER_SITE_OTHER): Payer: PPO | Admitting: Ophthalmology

## 2021-03-25 ENCOUNTER — Other Ambulatory Visit: Payer: Self-pay | Admitting: Internal Medicine

## 2021-04-02 DIAGNOSIS — H0223C Paralytic lagophthalmos, bilateral, upper and lower eyelids: Secondary | ICD-10-CM | POA: Diagnosis not present

## 2021-04-02 DIAGNOSIS — H16213 Exposure keratoconjunctivitis, bilateral: Secondary | ICD-10-CM | POA: Diagnosis not present

## 2021-04-02 DIAGNOSIS — H02823 Cysts of right eye, unspecified eyelid: Secondary | ICD-10-CM | POA: Diagnosis not present

## 2021-04-02 DIAGNOSIS — H47012 Ischemic optic neuropathy, left eye: Secondary | ICD-10-CM | POA: Diagnosis not present

## 2021-04-08 ENCOUNTER — Telehealth: Payer: Self-pay | Admitting: Internal Medicine

## 2021-04-08 ENCOUNTER — Ambulatory Visit (INDEPENDENT_AMBULATORY_CARE_PROVIDER_SITE_OTHER): Payer: PPO | Admitting: Internal Medicine

## 2021-04-08 ENCOUNTER — Other Ambulatory Visit: Payer: Self-pay

## 2021-04-08 ENCOUNTER — Encounter: Payer: Self-pay | Admitting: Internal Medicine

## 2021-04-08 DIAGNOSIS — L509 Urticaria, unspecified: Secondary | ICD-10-CM

## 2021-04-08 NOTE — Telephone Encounter (Signed)
Handicap placard has been filled out and placed in the mail.

## 2021-04-08 NOTE — Telephone Encounter (Signed)
   Patient requesting form for handicap placard be completed and mailed to her

## 2021-04-08 NOTE — Progress Notes (Signed)
   Subjective:   Patient ID: Gabrielle [name], female    DOB: 07/26/1951, 70 y.o.   MRN: [id number]  HPI The patient is a 70 YO female coming in for rash and itching. She did eat shrimp over the weekend. She does have prior allergic reaction with shellfish. Did get symptoms within 12 hours of eating this. Denies fevers or chills. Did get itching all over. Rash on the left leg and some hives and swelling around that area. Denies rash on face or throat swelling. Overall the itching is improving. She has used otc cream on the rash. Did not take benadryl as she was concerned about taking this with her xanax.   Review of Systems  Constitutional: Negative.   HENT: Negative.   Eyes: Negative.   Respiratory: Negative for cough, chest tightness and shortness of breath.   Cardiovascular: Negative for chest pain, palpitations and leg swelling.  Gastrointestinal: Negative for abdominal distention, abdominal pain, constipation, diarrhea, nausea and vomiting.  Musculoskeletal: Negative.   Skin: Positive for rash.  Neurological: Negative.   Psychiatric/Behavioral: Negative.     Objective:  Physical Exam Constitutional:      Appearance: She is well-developed.  HENT:     Head: Normocephalic and atraumatic.     Mouth/Throat:     Mouth: Mucous membranes are dry.     Pharynx: No posterior oropharyngeal erythema.  Cardiovascular:     Rate and Rhythm: Normal rate and regular rhythm.  Pulmonary:     Effort: Pulmonary effort is normal. No respiratory distress.     Breath sounds: Normal breath sounds. No wheezing or rales.  Abdominal:     General: Bowel sounds are normal. There is no distension.     Palpations: Abdomen is soft.     Tenderness: There is no abdominal tenderness. There is no rebound.  Musculoskeletal:     Cervical back: Normal range of motion.  Skin:    General: Skin is warm and dry.     Findings: Rash present.     Comments: Rash on the left thigh area consistent with hives.    Neurological:     Mental Status: She is alert and oriented to person, place, and time.     Coordination: Coordination normal.     Vitals:   04/08/21 1025  BP: 130/88  Pulse: 68  Resp: 18  Temp: 98 F (36.7 C)  TempSrc: Oral  SpO2: 98%  Weight: 217 lb 9.6 oz (98.7 kg)  Height: 5\' 5"  (1.651 m)    This visit occurred during the SARS-CoV-2 public health emergency.  Safety protocols were in place, including screening questions prior to the visit, additional usage of staff PPE, and extensive cleaning of exam room while observing appropriate contact time as indicated for disinfecting solutions.   Assessment & Plan:  Visit time 15 minutes in face to face communication with patient and coordination of care, additional 5 minutes spent in record review, coordination or care, ordering tests, communicating/referring to other healthcare professionals, documenting in medical records all on the same day of the visit for total time 20 minutes spent on the visit.

## 2021-04-08 NOTE — Patient Instructions (Signed)
You can use benadryl for itching if needed and cream on the leg.

## 2021-04-09 ENCOUNTER — Ambulatory Visit (INDEPENDENT_AMBULATORY_CARE_PROVIDER_SITE_OTHER): Payer: PPO | Admitting: Pharmacist

## 2021-04-09 DIAGNOSIS — M170 Bilateral primary osteoarthritis of knee: Secondary | ICD-10-CM | POA: Diagnosis not present

## 2021-04-09 DIAGNOSIS — H9313 Tinnitus, bilateral: Secondary | ICD-10-CM

## 2021-04-09 DIAGNOSIS — F411 Generalized anxiety disorder: Secondary | ICD-10-CM

## 2021-04-09 DIAGNOSIS — F4312 Post-traumatic stress disorder, chronic: Secondary | ICD-10-CM

## 2021-04-09 DIAGNOSIS — L509 Urticaria, unspecified: Secondary | ICD-10-CM | POA: Insufficient documentation
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
## 2021-04-09 NOTE — Assessment & Plan Note (Signed)
Overall resolving, advised to avoid shellfish. Can use benadryl otc for itching. Continue with cream for rash. Does not need steroid course at this time since it is resolving. Counseled about symptoms to monitor for.

## 2021-04-09 NOTE — Patient Instructions (Signed)
Visit Information  Phone number for Pharmacist: [phone]  Goals Addressed            This Visit's Progress   . Manage My Medicine       Timeframe:  Long-Range Goal Priority:  Medium Start Date:      03/23/21                       Expected End Date:     09/23/21                  Follow Up Date 07/25/21   - call for medicine refill 2 or 3 days before it runs out - call if I am sick and can't take my medicine - keep a list of all the medicines I take; vitamins and herbals too  -Take baclofen at night to help with tinnitus -Start citalopram 10 mg - 1/2 tablet daily for 1 week, then increase to whole tablet. Contact pharmacist with any issues.   Why is this important?   . These steps will help you keep on track with your medicines.   Notes:       Patient Care Plan: CCM Pharmacy Care Plan    Problem Identified: Hypertension, GERD, Depression, Anxiety and Pain/Tinnitus   Priority: High    Long-Range Goal: Disease management   Start Date: 03/23/2021  Expected End Date: 09/23/2021  This Visit's Progress: Not on track  Recent Progress: On track  Priority: High  Note:   Current Barriers:  . Unable to independently monitor therapeutic efficacy . Unable to achieve control of tinnitus  . Suboptimal therapeutic regimen for anxiety  Pharmacist Clinical Goal(s):  Marland Kitchen Patient will achieve adherence to monitoring guidelines and medication adherence to achieve therapeutic efficacy . adhere to plan to optimize therapeutic regimen for anxiety as evidenced by report of adherence to recommended medication management changes through collaboration with PharmD and provider.   Interventions: . 1:1 collaboration with [name], MD regarding development and update of comprehensive plan of care as evidenced by provider attestation and co-signature . Inter-disciplinary care team collaboration (see longitudinal plan of care) . Comprehensive medication review performed; medication list  updated in electronic medical record  Depression/Anxiety (PTSD) (Goal: manage symptoms) -Not ideally controlled - per patient tinnitus is the cause of her anxiety; thorough workup by several providers (ENT, neurology) have not yielded etiology/treatment for tinnitus; she saw a neurologist recently who wants to do an MRI but pt does not think she can handle an MRI > 15 min due to anxiety and noise -pt does not like being "dependent" on pills and wants to get off of alprazolam; she does speak with therapist at the [place] twice a month which is helpful for coping -Pt has picked up citalopram but has not started it yet. -Accupuncture has helped in the past with tinnitus, but pt is worried about trying this in case it makes tinnitus worse -PHQ9: 7 (10/2020) -GAD7: 19 (08/2018) -Current treatment: . Alprazolam 1 mg TID - usually just 9pm. Takes 1/2 during day w/ bad tinnitus . Zolpidem 10 mg HS prn  - gets 4-5 hrs of sleep . Citalopram 10 mg - 1/2 tab daily (pt has not started yet) -Medications previously tried/failed: Citalopram (stopped after 3 days after reading side effects, she did not personally experience ADR), buspar, mirtazapine, trazodone -Counseled on Benefits of SSRI; it is possible tinnitus is stemming from mental/emotional distress and SSRI may help stabilize this and allow her to  use less alprazolam; pt has never tried an SSRI or other antidepressant for longer than a few days; she agreed to try one now, and would like to try one she has been prescribed before -Advised patient to contact MRI office and ask how long MRI will take - if < 15 min encouraged patient to make appt and f/u with neurology -Recommended to initiate citalopram 10 mg - 1/2 tablet daily x 1 week then increase to whole tablet. Continue alprazolam while titrating; may consider reducing alprazolam in the future as tolerated  Pain / Tinnitus (Goal: manage symptoms) -Not ideally controlled - pt reports pain has improved overall;  she is no longer taking baclofen, she does not like to take pain medication; she did report that tinnitus improved while taking baclofen -DJD of knee, TMJ  -Current treatment  . Baclofen 10 mg HS  -Medications previously tried: meloxicam  -Discussed bacofen is a muscle relaxer, if tinnitus is stemming from myoclonus (as one Dr told her in the past), this could be why she noticed an improvement in tinnitus while taking it -Recommended to continue baclofen 10 mg HS  Patient Goals/Self-Care Activities . Patient will:  - take medications as prescribed focus on medication adherence by routine check blood pressure weekly, document, and provide at future appointments Start taking citalopram 5 mg daily (1/2 of 10 mg)  Follow Up Plan: Telephone follow up appointment with care management team member scheduled for: 3 month      Patient verbalizes understanding of instructions provided today and agrees to view in [place].  Telephone follow up appointment with pharmacy team member scheduled for: 3 months  Charlene Brooke, PharmD, [place], CPP Clinical Pharmacist [place] [facility] [phone]

## 2021-04-13 ENCOUNTER — Telehealth: Payer: Self-pay | Admitting: Internal Medicine

## 2021-04-13 NOTE — Telephone Encounter (Signed)
[facility] [employer] FYI   Caller states she had an appointment about a rash on her leg on Tues. She has hives on her chest and it is itching.  Advised home care. Patient understood.

## 2021-04-14 DIAGNOSIS — H11823 Conjunctivochalasis, bilateral: Secondary | ICD-10-CM | POA: Diagnosis not present

## 2021-04-14 DIAGNOSIS — Z961 Presence of intraocular lens: Secondary | ICD-10-CM | POA: Diagnosis not present

## 2021-04-14 DIAGNOSIS — H0288B Meibomian gland dysfunction left eye, upper and lower eyelids: Secondary | ICD-10-CM | POA: Diagnosis not present

## 2021-04-14 DIAGNOSIS — H0288A Meibomian gland dysfunction right eye, upper and lower eyelids: Secondary | ICD-10-CM | POA: Diagnosis not present

## 2021-04-14 DIAGNOSIS — D3132 Benign neoplasm of left choroid: Secondary | ICD-10-CM | POA: Diagnosis not present

## 2021-04-14 DIAGNOSIS — H40053 Ocular hypertension, bilateral: Secondary | ICD-10-CM | POA: Diagnosis not present

## 2021-04-14 DIAGNOSIS — H47012 Ischemic optic neuropathy, left eye: Secondary | ICD-10-CM | POA: Diagnosis not present

## 2021-04-14 DIAGNOSIS — Z9889 Other specified postprocedural states: Secondary | ICD-10-CM | POA: Diagnosis not present

## 2021-04-14 DIAGNOSIS — H43811 Vitreous degeneration, right eye: Secondary | ICD-10-CM | POA: Diagnosis not present

## 2021-04-14 DIAGNOSIS — H16223 Keratoconjunctivitis sicca, not specified as Sjogren's, bilateral: Secondary | ICD-10-CM | POA: Diagnosis not present

## 2021-04-21 NOTE — Telephone Encounter (Signed)
    Patient states she has not received handicap placard form in the mail.  Please call

## 2021-04-21 NOTE — Telephone Encounter (Signed)
Unable to get in contact with th patient. LVM asking the patient to return my call here at the office. Office number was provided.

## 2021-04-23 ENCOUNTER — Other Ambulatory Visit: Payer: Self-pay | Admitting: Internal Medicine

## 2021-05-03 ENCOUNTER — Other Ambulatory Visit: Payer: Self-pay | Admitting: Internal Medicine

## 2021-05-03 DIAGNOSIS — F5104 Psychophysiologic insomnia: Secondary | ICD-10-CM

## 2021-05-04 ENCOUNTER — Telehealth: Payer: Self-pay | Admitting: Internal Medicine

## 2021-05-04 DIAGNOSIS — Z1231 Encounter for screening mammogram for malignant neoplasm of breast: Secondary | ICD-10-CM | POA: Diagnosis not present

## 2021-05-04 NOTE — Telephone Encounter (Signed)
   Patient calling to report she had a fall over the weekend. She is having pain in her right hip, leg and foot. Patient is requesting order for xray Declined office appointment   Please advise/call

## 2021-05-04 NOTE — Telephone Encounter (Signed)
Patient called and was notified of Dr. Charlynne Cousins response. She said she would go to the [place] to see if they could do a STAT x-ray. She said she did not want an OV.

## 2021-05-04 NOTE — Telephone Encounter (Signed)
See below

## 2021-05-04 NOTE — Telephone Encounter (Signed)
Fyi.

## 2021-05-04 NOTE — Telephone Encounter (Signed)
1.Medication Requested: zolpidem (AMBIEN) 10 MG tablet    2. Pharmacy (Name, Street, [place]): [id number] [place], [place]  3. On Med List: yes   4. Last Visit with PCP: 04-08-21  5. Next visit date with PCP: n/a   Patient is on her last pill.    Agent: Please be advised that RX refills may take up to 3 business days. We ask that you follow-up with your pharmacy.

## 2021-05-04 NOTE — Telephone Encounter (Signed)
We can't order x-ray without office visit to assess if these are needed.

## 2021-05-05 NOTE — Telephone Encounter (Signed)
Duplicate encounter can close.

## 2021-05-06 ENCOUNTER — Encounter (INDEPENDENT_AMBULATORY_CARE_PROVIDER_SITE_OTHER): Payer: PPO | Admitting: Ophthalmology

## 2021-05-06 ENCOUNTER — Ambulatory Visit: Payer: PPO | Admitting: Internal Medicine

## 2021-05-07 DIAGNOSIS — E785 Hyperlipidemia, unspecified: Secondary | ICD-10-CM | POA: Diagnosis not present

## 2021-05-07 DIAGNOSIS — R8281 Pyuria: Secondary | ICD-10-CM | POA: Diagnosis not present

## 2021-05-07 DIAGNOSIS — E669 Obesity, unspecified: Secondary | ICD-10-CM | POA: Diagnosis not present

## 2021-05-07 DIAGNOSIS — I10 Essential (primary) hypertension: Secondary | ICD-10-CM | POA: Diagnosis not present

## 2021-05-12 ENCOUNTER — Other Ambulatory Visit: Payer: Self-pay

## 2021-05-12 ENCOUNTER — Encounter: Payer: Self-pay | Admitting: Internal Medicine

## 2021-05-12 ENCOUNTER — Ambulatory Visit (INDEPENDENT_AMBULATORY_CARE_PROVIDER_SITE_OTHER): Payer: PPO | Admitting: Internal Medicine

## 2021-05-12 VITALS — BP 138/80 | HR 63 | Temp 98.5°F | Ht 64.0 in | Wt 225.4 lb

## 2021-05-12 DIAGNOSIS — M25551 Pain in right hip: Secondary | ICD-10-CM | POA: Diagnosis not present

## 2021-05-12 DIAGNOSIS — M5441 Lumbago with sciatica, right side: Secondary | ICD-10-CM

## 2021-05-12 NOTE — Patient Instructions (Signed)
We can get you in with physical therapy.   The meloxicam will help with the pain and inflammation.  The baclofen can help with the pain but not the inflammation.

## 2021-05-12 NOTE — Progress Notes (Signed)
   Subjective:   Patient ID: Gabrielle [name], female    DOB: [birth year], 70 y.o.   MRN: [id number]  HPI The patient is a 70 YO female coming in for right hip pain. Had a fall on 05/03/21 and is concerned about this. She is having pain in that area. Has tried meloxicam and baclofen but she does not like taking medications and so is using sparingly. Mostly has taken the meloxicam a couple of times. Was seen at [facility] after this happened and they did x-rays on the low back, hips and knees. There is some arthritis especially in the left knee which is flared up. Overall it is maybe improving some. Prior right knee replacement.   Review of Systems  Constitutional: Negative.   HENT: Positive for tinnitus.   Eyes: Negative.   Respiratory: Negative for cough, chest tightness and shortness of breath.   Cardiovascular: Negative for chest pain, palpitations and leg swelling.  Gastrointestinal: Negative for abdominal distention, abdominal pain, constipation, diarrhea, nausea and vomiting.  Musculoskeletal: Positive for arthralgias and myalgias.  Skin: Negative.   Neurological: Negative.   Psychiatric/Behavioral: Negative.     Objective:  Physical Exam Constitutional:      Appearance: She is well-developed.  HENT:     Head: Normocephalic and atraumatic.  Cardiovascular:     Rate and Rhythm: Normal rate and regular rhythm.  Pulmonary:     Effort: Pulmonary effort is normal. No respiratory distress.     Breath sounds: Normal breath sounds. No wheezing or rales.  Abdominal:     General: Bowel sounds are normal. There is no distension.     Palpations: Abdomen is soft.     Tenderness: There is no abdominal tenderness. There is no rebound.  Musculoskeletal:        General: Tenderness present.     Cervical back: Normal range of motion.     Comments: Pain low back bilateral and right buttock and thigh and right IT band  Skin:    General: Skin is warm and dry.  Neurological:     Mental Status: She is  alert and oriented to person, place, and time.     Coordination: Coordination normal.     Vitals:   05/12/21 1311  BP: 138/80  Pulse: 63  Temp: 98.5 F (36.9 C)  TempSrc: Oral  SpO2: 98%  Weight: 225 lb 6.4 oz (102.2 kg)  Height: 5\' 4"  (1.626 m)    This visit occurred during the SARS-CoV-2 public health emergency.  Safety protocols were in place, including screening questions prior to the visit, additional usage of staff PPE, and extensive cleaning of exam room while observing appropriate contact time as indicated for disinfecting solutions.   Assessment & Plan:

## 2021-05-13 DIAGNOSIS — M25551 Pain in right hip: Secondary | ICD-10-CM | POA: Insufficient documentation

## 2021-05-13 DIAGNOSIS — M5441 Lumbago with sciatica, right side: Secondary | ICD-10-CM | POA: Insufficient documentation

## 2021-05-13 NOTE — Assessment & Plan Note (Signed)
Referral to PT likely some bursitis/tendonitis from the fall and strain.

## 2021-05-13 NOTE — Assessment & Plan Note (Signed)
Advised to keep using meloxicam and baclofen only if needed for pain. Referral to PT for improvement and water therapy if possible.

## 2021-05-20 ENCOUNTER — Other Ambulatory Visit: Payer: Self-pay | Admitting: Internal Medicine

## 2021-05-20 DIAGNOSIS — F4312 Post-traumatic stress disorder, chronic: Secondary | ICD-10-CM

## 2021-06-01 DIAGNOSIS — H93A3 Pulsatile tinnitus, bilateral: Secondary | ICD-10-CM | POA: Diagnosis not present

## 2021-06-16 DIAGNOSIS — H16213 Exposure keratoconjunctivitis, bilateral: Secondary | ICD-10-CM | POA: Diagnosis not present

## 2021-06-16 DIAGNOSIS — H47292 Other optic atrophy, left eye: Secondary | ICD-10-CM | POA: Diagnosis not present

## 2021-06-16 DIAGNOSIS — H02115 Cicatricial ectropion of left lower eyelid: Secondary | ICD-10-CM | POA: Diagnosis not present

## 2021-06-16 DIAGNOSIS — H02112 Cicatricial ectropion of right lower eyelid: Secondary | ICD-10-CM | POA: Diagnosis not present

## 2021-06-24 DIAGNOSIS — D3132 Benign neoplasm of left choroid: Secondary | ICD-10-CM | POA: Diagnosis not present

## 2021-06-24 DIAGNOSIS — H0288B Meibomian gland dysfunction left eye, upper and lower eyelids: Secondary | ICD-10-CM | POA: Diagnosis not present

## 2021-06-24 DIAGNOSIS — H40053 Ocular hypertension, bilateral: Secondary | ICD-10-CM | POA: Diagnosis not present

## 2021-06-24 DIAGNOSIS — Z9889 Other specified postprocedural states: Secondary | ICD-10-CM | POA: Diagnosis not present

## 2021-06-24 DIAGNOSIS — H0288A Meibomian gland dysfunction right eye, upper and lower eyelids: Secondary | ICD-10-CM | POA: Diagnosis not present

## 2021-06-24 DIAGNOSIS — Z961 Presence of intraocular lens: Secondary | ICD-10-CM | POA: Diagnosis not present

## 2021-06-24 DIAGNOSIS — H43811 Vitreous degeneration, right eye: Secondary | ICD-10-CM | POA: Diagnosis not present

## 2021-06-24 DIAGNOSIS — H47012 Ischemic optic neuropathy, left eye: Secondary | ICD-10-CM | POA: Diagnosis not present

## 2021-06-24 DIAGNOSIS — H16223 Keratoconjunctivitis sicca, not specified as Sjogren's, bilateral: Secondary | ICD-10-CM | POA: Diagnosis not present

## 2021-06-24 DIAGNOSIS — H11823 Conjunctivochalasis, bilateral: Secondary | ICD-10-CM | POA: Diagnosis not present

## 2021-07-08 ENCOUNTER — Ambulatory Visit (INDEPENDENT_AMBULATORY_CARE_PROVIDER_SITE_OTHER): Payer: PPO | Admitting: Internal Medicine

## 2021-07-08 ENCOUNTER — Encounter: Payer: Self-pay | Admitting: Internal Medicine

## 2021-07-08 ENCOUNTER — Other Ambulatory Visit: Payer: Self-pay

## 2021-07-08 DIAGNOSIS — H93293 Other abnormal auditory perceptions, bilateral: Secondary | ICD-10-CM

## 2021-07-08 DIAGNOSIS — M7989 Other specified soft tissue disorders: Secondary | ICD-10-CM

## 2021-07-08 NOTE — Progress Notes (Signed)
   Subjective:   Patient ID: Gabrielle [name], female    DOB: May 20, 1951, 70 y.o.   MRN: [id number]  HPI The patient is a 70 YO female coming in for swelling in the legs. Traveled to [place] and they did swell while traveling but did not reduce as quickly as normal. Are going down now in the last day.   Review of Systems  Constitutional: Negative.   HENT: Negative.    Eyes: Negative.   Respiratory:  Negative for cough, chest tightness and shortness of breath.   Cardiovascular:  Positive for leg swelling. Negative for chest pain and palpitations.  Gastrointestinal:  Negative for abdominal distention, abdominal pain, constipation, diarrhea, nausea and vomiting.  Musculoskeletal: Negative.   Skin: Negative.   Neurological: Negative.   Psychiatric/Behavioral: Negative.     Objective:  Physical Exam Constitutional:      Appearance: She is well-developed.  HENT:     Head: Normocephalic and atraumatic.  Cardiovascular:     Rate and Rhythm: Normal rate and regular rhythm.  Pulmonary:     Effort: Pulmonary effort is normal. No respiratory distress.     Breath sounds: Normal breath sounds. No wheezing or rales.  Abdominal:     General: Bowel sounds are normal. There is no distension.     Palpations: Abdomen is soft.     Tenderness: There is no abdominal tenderness. There is no rebound.  Musculoskeletal:     Cervical back: Normal range of motion.     Comments: Minimal edema ankles and feet without pitting  Skin:    General: Skin is warm and dry.  Neurological:     Mental Status: She is alert and oriented to person, place, and time.     Coordination: Coordination normal.    Vitals:   07/08/21 0948  BP: 132/80  Pulse: 71  Resp: 18  Temp: 98.4 F (36.9 C)  TempSrc: Oral  SpO2: 97%  Weight: 228 lb 3.2 oz (103.5 kg)  Height: 5\' 4"  (1.626 m)    This visit occurred during the SARS-CoV-2 public health emergency.  Safety protocols were in place, including screening questions  prior to the visit, additional usage of staff PPE, and extensive cleaning of exam room while observing appropriate contact time as indicated for disinfecting solutions.   Assessment & Plan:

## 2021-07-09 ENCOUNTER — Ambulatory Visit (INDEPENDENT_AMBULATORY_CARE_PROVIDER_SITE_OTHER): Payer: PPO | Admitting: Pharmacist

## 2021-07-09 ENCOUNTER — Ambulatory Visit: Payer: PPO | Admitting: Internal Medicine

## 2021-07-09 ENCOUNTER — Encounter: Payer: Self-pay | Admitting: Internal Medicine

## 2021-07-09 DIAGNOSIS — F411 Generalized anxiety disorder: Secondary | ICD-10-CM

## 2021-07-09 DIAGNOSIS — I10 Essential (primary) hypertension: Secondary | ICD-10-CM | POA: Diagnosis not present

## 2021-07-09 DIAGNOSIS — K219 Gastro-esophageal reflux disease without esophagitis: Secondary | ICD-10-CM

## 2021-07-09 DIAGNOSIS — F4312 Post-traumatic stress disorder, chronic: Secondary | ICD-10-CM

## 2021-07-09 DIAGNOSIS — H9313 Tinnitus, bilateral: Secondary | ICD-10-CM

## 2021-07-09 DIAGNOSIS — M7989 Other specified soft tissue disorders: Secondary | ICD-10-CM | POA: Insufficient documentation

## 2021-07-09 DIAGNOSIS — F5104 Psychophysiologic insomnia: Secondary | ICD-10-CM

## 2021-07-09 NOTE — Assessment & Plan Note (Signed)
We talked about how travel can cause swelling. Likely over the trip there was higher than usual sodium intake which can cause swelling. Once she returned and was back on normal diet the swelling did decrease. No indication for [place] as no signs/symptoms of DVT on exam.

## 2021-07-09 NOTE — Assessment & Plan Note (Signed)
She is still struggling with severe tinnitus and this limits her life. We did talk about possibility that increased sodium over the trip she had less tinnitus. We could trial higher sodium to see if this helps.
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
## 2021-07-10 MED ORDER — VORTIOXETINE HBR 5 MG PO TABS: 5.0000 mg | ORAL_TABLET | Freq: Every day | ORAL | 0 refills | Status: DC

## 2021-07-10 NOTE — Patient Instructions (Signed)
Visit Information  Phone number for Pharmacist: [phone]   Goals Addressed             This Visit's Progress    Manage My Medicine       Timeframe:  Long-Range Goal Priority:  Medium Start Date:      03/23/21                       Expected End Date:     09/23/21                  Follow Up Date July 2022   - call for medicine refill 2 or 3 days before it runs out - call if I am sick and can't take my medicine - keep a list of all the medicines I take; vitamins and herbals too  -Start Trintellix 5 mg daily - pick up samples from [place] office   Why is this important?   These steps will help you keep on track with your medicines.   Notes:         Patient verbalizes understanding of instructions provided today and agrees to view in [place].  Telephone follow up appointment with pharmacy team member scheduled for: 1 week  Charlene Brooke, PharmD, Para [place], CPP Clinical Pharmacist [facility] [phone]

## 2021-07-15 DIAGNOSIS — H9319 Tinnitus, unspecified ear: Secondary | ICD-10-CM | POA: Diagnosis not present

## 2021-07-15 DIAGNOSIS — H905 Unspecified sensorineural hearing loss: Secondary | ICD-10-CM | POA: Diagnosis not present

## 2021-07-16 ENCOUNTER — Ambulatory Visit: Payer: PPO | Admitting: Pharmacist

## 2021-07-16 ENCOUNTER — Other Ambulatory Visit: Payer: Self-pay

## 2021-07-16 DIAGNOSIS — F4312 Post-traumatic stress disorder, chronic: Secondary | ICD-10-CM

## 2021-07-16 DIAGNOSIS — F411 Generalized anxiety disorder: Secondary | ICD-10-CM

## 2021-07-16 DIAGNOSIS — H9313 Tinnitus, bilateral: Secondary | ICD-10-CM

## 2021-07-16 DIAGNOSIS — I10 Essential (primary) hypertension: Secondary | ICD-10-CM
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
## 2021-07-17 NOTE — Patient Instructions (Signed)
Visit Information  Phone number for Pharmacist: [phone]   Goals Addressed             This Visit's Progress    Manage My Medicine       Timeframe:  Long-Range Goal Priority:  Medium Start Date:      03/23/21                       Expected End Date:     09/23/21                  Follow Up Date Aug 2022   - call for medicine refill 2 or 3 days before it runs out - call if I am sick and can't take my medicine - keep a list of all the medicines I take; vitamins and herbals too  -Discuss medication alternatives with psychiatrist   Why is this important?   These steps will help you keep on track with your medicines.   Notes:         Patient verbalizes understanding of instructions provided today and agrees to view in [place].  Telephone follow up appointment with pharmacy team member scheduled for: 1 month  Charlene Brooke, PharmD, [place], CPP Clinical Pharmacist [place] [facility] at [facility] [phone]

## 2021-07-28 ENCOUNTER — Inpatient Hospital Stay (HOSPITAL_COMMUNITY): Payer: No Typology Code available for payment source

## 2021-07-28 ENCOUNTER — Other Ambulatory Visit: Payer: Self-pay

## 2021-07-28 ENCOUNTER — Inpatient Hospital Stay (HOSPITAL_BASED_OUTPATIENT_CLINIC_OR_DEPARTMENT_OTHER)
Admission: EM | Admit: 2021-07-28 | Discharge: 2021-08-01 | DRG: 378 | Disposition: A | Discharge status: Home / Self Care | Payer: No Typology Code available for payment source | Attending: Internal Medicine | Admitting: Internal Medicine

## 2021-07-28 ENCOUNTER — Emergency Department (HOSPITAL_BASED_OUTPATIENT_CLINIC_OR_DEPARTMENT_OTHER): Payer: No Typology Code available for payment source

## 2021-07-28 ENCOUNTER — Encounter (HOSPITAL_BASED_OUTPATIENT_CLINIC_OR_DEPARTMENT_OTHER): Payer: Self-pay | Admitting: *Deleted

## 2021-07-28 DIAGNOSIS — F32A Depression, unspecified: Secondary | ICD-10-CM | POA: Diagnosis not present

## 2021-07-28 DIAGNOSIS — H93A3 Pulsatile tinnitus, bilateral: Secondary | ICD-10-CM | POA: Diagnosis present

## 2021-07-28 DIAGNOSIS — Z8673 Personal history of transient ischemic attack (TIA), and cerebral infarction without residual deficits: Secondary | ICD-10-CM

## 2021-07-28 DIAGNOSIS — R5381 Other malaise: Secondary | ICD-10-CM | POA: Diagnosis not present

## 2021-07-28 DIAGNOSIS — Z96651 Presence of right artificial knee joint: Secondary | ICD-10-CM | POA: Diagnosis present

## 2021-07-28 DIAGNOSIS — K317 Polyp of stomach and duodenum: Secondary | ICD-10-CM | POA: Diagnosis not present

## 2021-07-28 DIAGNOSIS — F419 Anxiety disorder, unspecified: Secondary | ICD-10-CM | POA: Diagnosis not present

## 2021-07-28 DIAGNOSIS — Z888 Allergy status to other drugs, medicaments and biological substances status: Secondary | ICD-10-CM

## 2021-07-28 DIAGNOSIS — K219 Gastro-esophageal reflux disease without esophagitis: Secondary | ICD-10-CM | POA: Diagnosis not present

## 2021-07-28 DIAGNOSIS — Z841 Family history of disorders of kidney and ureter: Secondary | ICD-10-CM | POA: Diagnosis not present

## 2021-07-28 DIAGNOSIS — K5730 Diverticulosis of large intestine without perforation or abscess without bleeding: Secondary | ICD-10-CM | POA: Diagnosis not present

## 2021-07-28 DIAGNOSIS — Z87891 Personal history of nicotine dependence: Secondary | ICD-10-CM | POA: Diagnosis not present

## 2021-07-28 DIAGNOSIS — R42 Dizziness and giddiness: Secondary | ICD-10-CM | POA: Diagnosis not present

## 2021-07-28 DIAGNOSIS — Z807 Family history of other malignant neoplasms of lymphoid, hematopoietic and related tissues: Secondary | ICD-10-CM | POA: Diagnosis not present

## 2021-07-28 DIAGNOSIS — K2100 Gastro-esophageal reflux disease with esophagitis, without bleeding: Secondary | ICD-10-CM | POA: Diagnosis present

## 2021-07-28 DIAGNOSIS — K922 Gastrointestinal hemorrhage, unspecified: Secondary | ICD-10-CM | POA: Insufficient documentation

## 2021-07-28 DIAGNOSIS — G8929 Other chronic pain: Secondary | ICD-10-CM | POA: Diagnosis not present

## 2021-07-28 DIAGNOSIS — K625 Hemorrhage of anus and rectum: Secondary | ICD-10-CM | POA: Insufficient documentation

## 2021-07-28 DIAGNOSIS — K921 Melena: Principal | ICD-10-CM

## 2021-07-28 DIAGNOSIS — I700 Atherosclerosis of aorta: Secondary | ICD-10-CM | POA: Diagnosis not present

## 2021-07-28 DIAGNOSIS — R7989 Other specified abnormal findings of blood chemistry: Secondary | ICD-10-CM | POA: Diagnosis not present

## 2021-07-28 DIAGNOSIS — M533 Sacrococcygeal disorders, not elsewhere classified: Secondary | ICD-10-CM | POA: Diagnosis not present

## 2021-07-28 DIAGNOSIS — Z91013 Allergy to seafood: Secondary | ICD-10-CM | POA: Diagnosis not present

## 2021-07-28 DIAGNOSIS — I10 Essential (primary) hypertension: Secondary | ICD-10-CM | POA: Diagnosis not present

## 2021-07-28 DIAGNOSIS — Z6839 Body mass index (BMI) 39.0-39.9, adult: Secondary | ICD-10-CM

## 2021-07-28 DIAGNOSIS — I959 Hypotension, unspecified: Secondary | ICD-10-CM | POA: Diagnosis not present

## 2021-07-28 DIAGNOSIS — R1310 Dysphagia, unspecified: Secondary | ICD-10-CM | POA: Diagnosis present

## 2021-07-28 DIAGNOSIS — F411 Generalized anxiety disorder: Secondary | ICD-10-CM | POA: Diagnosis not present

## 2021-07-28 DIAGNOSIS — R1111 Vomiting without nausea: Secondary | ICD-10-CM | POA: Diagnosis not present

## 2021-07-28 DIAGNOSIS — R110 Nausea: Secondary | ICD-10-CM | POA: Diagnosis not present

## 2021-07-28 DIAGNOSIS — D509 Iron deficiency anemia, unspecified: Secondary | ICD-10-CM | POA: Diagnosis not present

## 2021-07-28 DIAGNOSIS — Z20822 Contact with and (suspected) exposure to covid-19: Secondary | ICD-10-CM | POA: Diagnosis present

## 2021-07-28 DIAGNOSIS — D62 Acute posthemorrhagic anemia: Secondary | ICD-10-CM

## 2021-07-28 DIAGNOSIS — D500 Iron deficiency anemia secondary to blood loss (chronic): Secondary | ICD-10-CM | POA: Diagnosis not present

## 2021-07-28 HISTORY — DX: Cerebral infarction, unspecified: I63.9

## 2021-07-28 LAB — LIPASE, BLOOD: Lipase: 20 U/L (ref 11–51)

## 2021-07-28 LAB — HEMOGLOBIN A1C
Hgb A1c MFr Bld: 5.2 % (ref 4.8–5.6)
Mean Plasma Glucose: 102.54 mg/dL

## 2021-07-28 LAB — CBC
HCT: 22.2 % — ABNORMAL LOW (ref 36.0–46.0)
Hemoglobin: 7 g/dL — ABNORMAL LOW (ref 12.0–15.0)
MCH: 28.5 pg (ref 26.0–34.0)
MCHC: 31.5 g/dL (ref 30.0–36.0)
MCV: 90.2 fL (ref 80.0–100.0)
Platelets: 137 10*3/uL — ABNORMAL LOW (ref 150–400)
RBC: 2.46 MIL/uL — ABNORMAL LOW (ref 3.87–5.11)
RDW: 14.4 % (ref 11.5–15.5)
WBC: 10.9 10*3/uL — ABNORMAL HIGH (ref 4.0–10.5)
nRBC: 0 % (ref 0.0–0.2)

## 2021-07-28 LAB — CBC WITH DIFFERENTIAL/PLATELET
Abs Immature Granulocytes: 0.04 10*3/uL (ref 0.00–0.07)
Basophils Absolute: 0 10*3/uL (ref 0.0–0.1)
Basophils Relative: 0 %
Eosinophils Absolute: 0 10*3/uL (ref 0.0–0.5)
Eosinophils Relative: 0 %
HCT: 20.5 % — ABNORMAL LOW (ref 36.0–46.0)
Hemoglobin: 6.6 g/dL — CL (ref 12.0–15.0)
Immature Granulocytes: 1 %
Lymphocytes Relative: 25 %
Lymphs Abs: 1.8 10*3/uL (ref 0.7–4.0)
MCH: 27.6 pg (ref 26.0–34.0)
MCHC: 32.2 g/dL (ref 30.0–36.0)
MCV: 85.8 fL (ref 80.0–100.0)
Monocytes Absolute: 0.4 10*3/uL (ref 0.1–1.0)
Monocytes Relative: 6 %
Neutro Abs: 5 10*3/uL (ref 1.7–7.7)
Neutrophils Relative %: 68 %
Platelets: 144 10*3/uL — ABNORMAL LOW (ref 150–400)
RBC: 2.39 MIL/uL — ABNORMAL LOW (ref 3.87–5.11)
RDW: 14 % (ref 11.5–15.5)
WBC: 7.3 10*3/uL (ref 4.0–10.5)
nRBC: 0 % (ref 0.0–0.2)

## 2021-07-28 LAB — COMPREHENSIVE METABOLIC PANEL
ALT: 8 U/L (ref 0–44)
AST: 14 U/L — ABNORMAL LOW (ref 15–41)
Albumin: 3.1 g/dL — ABNORMAL LOW (ref 3.5–5.0)
Alkaline Phosphatase: 41 U/L (ref 38–126)
Anion gap: 6 (ref 5–15)
BUN: 44 mg/dL — ABNORMAL HIGH (ref 8–23)
CO2: 26 mmol/L (ref 22–32)
Calcium: 7.9 mg/dL — ABNORMAL LOW (ref 8.9–10.3)
Chloride: 108 mmol/L (ref 98–111)
Creatinine, Ser: 0.8 mg/dL (ref 0.44–1.00)
GFR, Estimated: >60 mL/min (ref >60)
Glucose, Bld: 100 mg/dL — ABNORMAL HIGH (ref 70–99)
Potassium: 4.1 mmol/L (ref 3.5–5.1)
Sodium: 140 mmol/L (ref 135–145)
Total Bilirubin: 0.2 mg/dL — ABNORMAL LOW (ref 0.3–1.2)
Total Protein: 5.1 g/dL — ABNORMAL LOW (ref 6.5–8.1)

## 2021-07-28 LAB — MRSA NEXT GEN BY PCR, NASAL: MRSA by PCR Next Gen: NOT DETECTED

## 2021-07-28 LAB — RESP PANEL BY RT-PCR (FLU A&B, COVID) ARPGX2
Influenza A by PCR: NEGATIVE
Influenza B by PCR: NEGATIVE
SARS Coronavirus 2 by RT PCR: NEGATIVE

## 2021-07-28 LAB — APTT: aPTT: 30 seconds (ref 24–36)

## 2021-07-28 LAB — GLUCOSE, CAPILLARY
Glucose-Capillary: 92 mg/dL (ref 70–99)
Glucose-Capillary: 98 mg/dL (ref 70–99)
Glucose-Capillary: 118 mg/dL — ABNORMAL HIGH (ref 70–99)

## 2021-07-28 LAB — HEMOGLOBIN AND HEMATOCRIT, BLOOD
HCT: 19.2 % — ABNORMAL LOW (ref 36.0–46.0)
Hemoglobin: 6.1 g/dL — CL (ref 12.0–15.0)

## 2021-07-28 LAB — OCCULT BLOOD X 1 CARD TO LAB, STOOL: Fecal Occult Bld: POSITIVE — AB

## 2021-07-28 LAB — PROTIME-INR
INR: 1.2 (ref 0.8–1.2)
Prothrombin Time: 14.8 seconds (ref 11.4–15.2)

## 2021-07-28 LAB — PREPARE RBC (CROSSMATCH)

## 2021-07-28 LAB — TROPONIN I (HIGH SENSITIVITY)
Troponin I (High Sensitivity): 6 ng/L (ref <18)
Troponin I (High Sensitivity): 6 ng/L (ref <18)

## 2021-07-28 LAB — LACTIC ACID, PLASMA: Lactic Acid, Venous: 2.5 mmol/L (ref 0.5–1.9)

## 2021-07-28 MED ORDER — ALPRAZOLAM 0.5 MG PO TABS
1.0000 mg | ORAL_TABLET | Freq: Once | ORAL | Status: AC
  Administered 2021-07-28: 1 mg via ORAL
  Filled 2021-07-28: qty 2

## 2021-07-28 MED ORDER — PANTOPRAZOLE SODIUM 40 MG IV SOLR
INTRAVENOUS | Status: AC
  Administered 2021-07-28: 80 mg via INTRAVENOUS
  Filled 2021-07-28: qty 80

## 2021-07-28 MED ORDER — PANTOPRAZOLE 80MG IVPB - SIMPLE MED
80.0000 mg | Freq: Once | INTRAVENOUS | Status: AC
  Administered 2021-07-28: 80 mg via INTRAVENOUS
  Filled 2021-07-28: qty 100

## 2021-07-28 MED ORDER — PANTOPRAZOLE SODIUM 40 MG IV SOLR
40.0000 mg | Freq: Two times a day (BID) | INTRAVENOUS | Status: DC
  Administered 2021-07-28 – 2021-08-01 (×8): 40 mg via INTRAVENOUS
  Filled 2021-07-28 (×8): qty 40

## 2021-07-28 MED ORDER — SODIUM CHLORIDE 0.9% IV SOLUTION: Freq: Once | INTRAVENOUS | Status: AC

## 2021-07-28 MED ORDER — LORAZEPAM 2 MG/ML IJ SOLN
1.0000 mg | INTRAMUSCULAR | Status: DC | PRN
Start: 2021-07-28 — End: 2021-07-28
  Administered 2021-07-28: 1 mg via INTRAVENOUS
  Filled 2021-07-28: qty 1

## 2021-07-28 MED ORDER — INSULIN ASPART 100 UNIT/ML IJ SOLN
0.0000 [IU] | INTRAMUSCULAR | Status: DC
  Administered 2021-07-30: 2 [IU] via SUBCUTANEOUS

## 2021-07-28 MED ORDER — CHLORHEXIDINE GLUCONATE CLOTH 2 % EX PADS
6.0000 | MEDICATED_PAD | Freq: Every day | CUTANEOUS | Status: DC
  Administered 2021-07-28 – 2021-07-31 (×4): 6 via TOPICAL

## 2021-07-28 MED ORDER — SODIUM CHLORIDE 0.9 % IV SOLN: INTRAVENOUS | Status: DC

## 2021-07-28 MED ORDER — SODIUM CHLORIDE 0.9 % IV BOLUS
500.0000 mL | Freq: Once | INTRAVENOUS | Status: AC
  Administered 2021-07-28: 500 mL via INTRAVENOUS

## 2021-07-28 MED ORDER — LORAZEPAM 2 MG/ML IJ SOLN
1.0000 mg | INTRAMUSCULAR | Status: AC | PRN
  Administered 2021-07-28 – 2021-07-29 (×5): 1 mg via INTRAVENOUS
  Filled 2021-07-28 (×5): qty 1

## 2021-07-28 MED ORDER — ORAL CARE MOUTH RINSE
15.0000 mL | Freq: Two times a day (BID) | OROMUCOSAL | Status: DC
  Administered 2021-07-28 – 2021-08-01 (×7): 15 mL via OROMUCOSAL

## 2021-07-28 NOTE — Consult Note (Signed)
Reason for Consult: Symptomatic anemia and melena Referring Physician: [facility]  Silvio Clayman HPI: This is a 70 year old female with a PMH GERD and dysphagia.  She presented to the ER today with complaints of melenic stools that started acutely yesterday.  As a result she felt dizzy and she had a near syncopal episode.  Her HGB upon admission was at 6.6 g/dL with an MCV at 85.8.  Her baseline HGB was at 13 g/dL.  On 03/29/2018 she underwent an EGD with Dr. Collene Mares for complaints of GERD and dysphagia.  No significant pathology was identified with the esophagealbiopsies.  Her colonoscopy on 10/2018 only showed two small adenomas. The patient denies any issues with hematemesis, nausea, or vomiting.  She does not report any use of NSAIDS and this is the first time she developed melenic stools.  Past Medical History:  Diagnosis Date   Anemia    as a child   Anxiety    takes Xanax daily   Arthritis    Cataracts, bilateral    immature   Chronic insomnia [id number]   Complication of anesthesia    Constipation    will occasionally take Milk of Mag   Diverticulosis    Dizziness    rarely   GERD (gastroesophageal reflux disease)    takes Omeprazole every other day   Glaucoma    borderline and no drops required   Hearing loss    but doesn't have hearing aids   History of bronchitis    many many yrs ago   History of colitis    History of colon polyps    Hypertension    hasn't been on meds for the past 55yr    Insomnia    takes Trazodone nightly as needed and Ambien nightly    Insomnia due to substance 10/11/2014   Joint pain    Joint swelling    PONV (postoperative nausea and vomiting)    Stroke (HCC)    Tinnitus    takes HCTZ daily to decrease pressure in ears    Past Surgical History:  Procedure Laterality Date   ABDOMINAL HYSTERECTOMY     APPENDECTOMY     COLONOSCOPY     IR RADIOLOGIST EVAL & MGMT  06/08/2017   tinnitus Right    TONSILLECTOMY     TOTAL KNEE  ARTHROPLASTY Right 03/13/2014   DR MURPHY   TOTAL KNEE ARTHROPLASTY Right 03/13/2014   Procedure: TOTAL KNEE ARTHROPLASTY;  Surgeon: DNinetta Lights MD;  [place] [facility]  Service: Orthopedics;  Laterality: Right;   TUBAL LIGATION      Family History  Problem Relation Age of Onset   Depression Mother    Cancer - Other Father    Kidney disease Father    Cancer - Other Sister        breast ca   Cancer - Other Brother        lymphoma in remission   Lymphoma Brother    Cancer - Other Sister        in remission breast ca    Social History:  reports that she quit smoking about 27 years ago. Her smoking use included cigarettes. She started smoking about 49 years ago. She has a 22.00 pack-year smoking history. She has never used smokeless tobacco. She reports that she does not drink alcohol and does not use drugs.  Allergies:  Allergies  Allergen Reactions   Iodinated Diagnostic Agents Hives   Other Hives, Other (See Comments) and  Rash   Shellfish Allergy Hives, Other (See Comments) and Rash   Gadolinium Derivatives Hives   Hydrocodone Other (See Comments)    insomnia   Metronidazole Nausea And Vomiting and Other (See Comments)   Acetazolamide Anxiety    Makes head feel strange   Gabapentin Anxiety    unknown   Mirtazapine Other (See Comments)    Dry eyes    Medications: Scheduled:  Chlorhexidine Gluconate Cloth  6 each Topical Daily   mouth rinse  15 mL Mouth Rinse BID   pantoprazole (PROTONIX) IV  40 mg Intravenous Q12H   Continuous:  Results for orders placed or performed during the hospital encounter of 07/28/21 (from the past 24 hour(s))  CBC with Differential     Status: Abnormal   Collection Time: 07/28/21  9:38 AM  Result Value Ref Range   WBC 7.3 4.0 - 10.5 K/uL   RBC 2.39 (L) 3.87 - 5.11 MIL/uL   Hemoglobin 6.6 (LL) 12.0 - 15.0 g/dL   HCT 20.5 (L) 36.0 - 46.0 %   MCV 85.8 80.0 - 100.0 fL   MCH 27.6 26.0 - 34.0 pg   MCHC 32.2 30.0 - 36.0 g/dL   RDW 14.0  11.5 - 15.5 %   Platelets 144 (L) 150 - 400 K/uL   nRBC 0.0 0.0 - 0.2 %   Neutrophils Relative % 68 %   Neutro Abs 5.0 1.7 - 7.7 K/uL   Lymphocytes Relative 25 %   Lymphs Abs 1.8 0.7 - 4.0 K/uL   Monocytes Relative 6 %   Monocytes Absolute 0.4 0.1 - 1.0 K/uL   Eosinophils Relative 0 %   Eosinophils Absolute 0.0 0.0 - 0.5 K/uL   Basophils Relative 0 %   Basophils Absolute 0.0 0.0 - 0.1 K/uL   Immature Granulocytes 1 %   Abs Immature Granulocytes 0.04 0.00 - 0.07 K/uL  Comprehensive metabolic panel     Status: Abnormal   Collection Time: 07/28/21  9:38 AM  Result Value Ref Range   Sodium 140 135 - 145 mmol/L   Potassium 4.1 3.5 - 5.1 mmol/L   Chloride 108 98 - 111 mmol/L   CO2 26 22 - 32 mmol/L   Glucose, Bld 100 (H) 70 - 99 mg/dL   BUN 44 (H) 8 - 23 mg/dL   Creatinine, Ser 0.80 0.44 - 1.00 mg/dL   Calcium 7.9 (L) 8.9 - 10.3 mg/dL   Total Protein 5.1 (L) 6.5 - 8.1 g/dL   Albumin 3.1 (L) 3.5 - 5.0 g/dL   AST 14 (L) 15 - 41 U/L   ALT 8 0 - 44 U/L   Alkaline Phosphatase 41 38 - 126 U/L   Total Bilirubin 0.2 (L) 0.3 - 1.2 mg/dL   GFR, Estimated >60 >60 mL/min   Anion gap 6 5 - 15  Lipase, blood     Status: None   Collection Time: 07/28/21  9:38 AM  Result Value Ref Range   Lipase 20 11 - 51 U/L  Troponin I (High Sensitivity)     Status: None   Collection Time: 07/28/21  9:39 AM  Result Value Ref Range   Troponin I (High Sensitivity) 6 <18 ng/L  Resp Panel by RT-PCR (Flu A&B, Covid) Nasopharyngeal Swab     Status: None   Collection Time: 07/28/21  9:39 AM   Specimen: Nasopharyngeal Swab; Nasopharyngeal(NP) swabs in vial transport medium  Result Value Ref Range   SARS Coronavirus 2 by RT PCR NEGATIVE NEGATIVE   Influenza A  by PCR NEGATIVE NEGATIVE   Influenza B by PCR NEGATIVE NEGATIVE  Hemoglobin and hematocrit, blood     Status: Abnormal   Collection Time: 07/28/21 11:24 AM  Result Value Ref Range   Hemoglobin 6.1 (LL) 12.0 - 15.0 g/dL   HCT 19.2 (L) 36.0 - 46.0 %   Troponin I (High Sensitivity)     Status: None   Collection Time: 07/28/21 11:24 AM  Result Value Ref Range   Troponin I (High Sensitivity) 6 <18 ng/L  Occult blood card to lab, stool     Status: Abnormal   Collection Time: 07/28/21 12:09 PM  Result Value Ref Range   Fecal Occult Bld POSITIVE (A) NEGATIVE  Type and screen     Status: None (Preliminary result)   Collection Time: 07/28/21  2:08 PM  Result Value Ref Range   ABO/RH(D) PENDING    Antibody Screen PENDING    Sample Expiration 07/31/2021,2359    Unit Number [id number]    Blood Component Type RED CELLS,LR    Unit division 00    Status of Unit ISSUED    Unit tag comment VERBAL ORDERS PER DR A WRIGHT    Transfusion Status      OK TO TRANSFUSE Performed at [facility], [address], [place], [place] [address]    Crossmatch Result PENDING      CT Abdomen Pelvis Wo Contrast  Result Date: 07/28/2021 CLINICAL DATA:  Bowel obstruction suspected nausea, vomiting EXAM: CT ABDOMEN AND PELVIS WITHOUT CONTRAST TECHNIQUE: Multidetector CT imaging of the abdomen and pelvis was performed following the standard protocol without IV contrast. COMPARISON:  None. FINDINGS: Lower chest: No acute findings Hepatobiliary: No focal hepatic abnormality. Gallbladder unremarkable. Pancreas: No focal abnormality or ductal dilatation. Spleen: No focal abnormality.  Normal size. Adrenals/Urinary Tract: No adrenal abnormality. No focal renal abnormality. No stones or hydronephrosis. Urinary bladder is unremarkable. Stomach/Bowel: Few scattered sigmoid diverticula. No active diverticulitis. Stomach and small bowel decompressed. No evidence of bowel obstruction. Vascular/Lymphatic: Aortic atherosclerosis. No evidence of aneurysm or adenopathy. Reproductive: Prior hysterectomy.  No adnexal masses. Other: No free fluid or free air. Musculoskeletal: No acute bony abnormality. IMPRESSION: Few scattered sigmoid diverticula.  No active  diverticulitis. No acute findings in the abdomen or pelvis. Aortic atherosclerosis. Electronically Signed   By: Rolm Baptise M.D.   On: 07/28/2021 10:11    ROS:  As stated above in the HPI otherwise negative.  Blood pressure (!) 90/53, pulse (!) 106, temperature 99.1 F (37.3 C), temperature source Oral, resp. rate 18, height '5\' 4"'$  (1.626 m), weight 103.4 kg, SpO2 100 %.    PE: Gen: NAD, Alert and Oriented HEENT:  [place]/AT, EOMI Neck: Supple, no LAD Lungs: CTA Bilaterally CV: RRR without M/G/R, tachycardic ABD: Soft, NTND, +BS Ext: No C/C/E  Assessment/Plan: 1) Melenic stools. 2) Symptomatic anemia. 3) GERD. 4) Anxiety.   The patient is stable.  She is still mildly hypotensive, but she is receiving her blood transfusions.  Further evaluation is necessary with an EGD as there is an acute drop in her HGB.  Plan: 1) EGD tomorrow. 2) PPI. 3) NPO. 4) Follow HGB and transfuse as necessary.  Geraldo Haris D 07/28/2021, 3:06 PM

## 2021-07-28 NOTE — ED Provider Notes (Addendum)
[name] Provider Note   CSN: [id number] Arrival date & time: 07/28/21  0913     History Chief Complaint  Patient presents with   Dizziness   Emesis   Nausea   Melena    [name] is a 70 y.o. female.  [name] has been having frequent bouts of black stool.  She has had some incontinence associated with this because of her frequent bowel movements.  She states that she feels dizzy, like she is going to pass out, but she denies pain.  No hematemesis.  She has a history of esophagitis.  The history is provided by the patient.  Rectal Bleeding Quality:  Black and tarry Amount:  Copious Duration:  1 day Timing:  Intermittent Chronicity:  New Context: spontaneously   Similar prior episodes: no   Relieved by:  Nothing Worsened by:  Nothing Associated symptoms: dizziness (like she is going to pass out)   Associated symptoms: no abdominal pain, no fever, no hematemesis and no vomiting       Past Medical History:  Diagnosis Date   Anemia    as a child   Anxiety    takes Xanax daily   Arthritis    Cataracts, bilateral    immature   Chronic insomnia [id number]   Complication of anesthesia    Constipation    will occasionally take Milk of Mag   Diverticulosis    Dizziness    rarely   GERD (gastroesophageal reflux disease)    takes Omeprazole every other day   Glaucoma    borderline and no drops required   Hearing loss    but doesn't have hearing aids   History of bronchitis    many many yrs ago   History of colitis    History of colon polyps    Hypertension    hasn't been on meds for the past 63yr    Insomnia    takes Trazodone nightly as needed and Ambien nightly    Insomnia due to substance 10/11/2014   Joint pain    Joint swelling    PONV (postoperative nausea and vomiting)    Stroke (HCC)    Tinnitus    takes HCTZ daily to decrease pressure in ears    Patient Active Problem List   Diagnosis Date Noted   Leg  swelling 07/09/2021   Right hip pain 05/13/2021   Acute bilateral low back pain with right-sided sciatica 05/13/2021   Hives 04/09/2021   Posterior vitreous detachment of both eyes 02/25/2021   Optic nerve atrophy 02/25/2021   Pseudophakia 02/25/2021   Vitreous membranes and strands, right eye 02/25/2021   Right thigh pain 01/22/2021   Hearing loss 08/14/2020   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   Essential (primary) hypertension 08/14/2020   Depression 08/14/2020   GAD (generalized anxiety disorder) 08/14/2020   [place] 05/01/2020   Family history of colon cancer 05/01/2020   Diverticular disease of colon 05/01/2020   Chronic idiopathic constipation 05/01/2020   Decreased vision of left eye 02/28/2020   Elevated blood pressure reading in office without diagnosis of hypertension 12/15/2018   Blindness 09/14/2018   GERD (gastroesophageal reflux disease) 04/21/2018   TMJ pain dysfunction syndrome 03/23/2018   Pulsatile tinnitus of both ears 03/23/2018   Chronic post-traumatic stress disorder (PTSD) 03/14/2018   Tinnitus 03/14/2018   Routine general medical examination at a health care facility 03/14/2018   Left ear pain 05/13/2017   Presbycusis of  both ears 03/30/2017   Abnormal auditory perception of both ears 06/03/2016   Neck pain 03/12/2016   Abnormality of gait 06/03/2015   S/P TKR (total knee replacement) 06/03/2015   Chronic left SI joint pain 06/03/2015   Insomnia 10/11/2014   Drug-induced sleep disorder ([place]) 10/11/2014   DJD (degenerative joint disease) of knee 03/13/2014   Sensorineural hearing loss, bilateral 11/27/2013    Past Surgical History:  Procedure Laterality Date   ABDOMINAL HYSTERECTOMY     APPENDECTOMY     COLONOSCOPY     IR RADIOLOGIST EVAL & MGMT  06/08/2017   tinnitus Right    TONSILLECTOMY     TOTAL KNEE ARTHROPLASTY Right 03/13/2014   DR MURPHY   TOTAL KNEE ARTHROPLASTY Right 03/13/2014   Procedure: TOTAL KNEE  ARTHROPLASTY;  Surgeon: [name], MD;  Location: [facility];  Service: Orthopedics;  Laterality: Right;   TUBAL LIGATION       OB History   No obstetric history on file.     Family History  Problem Relation Age of Onset   Depression Mother    Cancer - Other Father    Kidney disease Father    Cancer - Other Sister        breast ca   Cancer - Other Brother        lymphoma in remission   Lymphoma Brother    Cancer - Other Sister        in remission breast ca    Social History   Tobacco Use   Smoking status: Former    Packs/day: 1.00    Years: 22.00    Pack years: 22.00    Types: Cigarettes    Start date: 03/08/1972    Quit date: 01/08/1994    Years since quitting: 27.5   Smokeless tobacco: Never   Tobacco comments:    quit smoking 2yr ago  Vaping Use   Vaping Use: Never used  Substance Use Topics   Alcohol use: No    Alcohol/week: 0.0 standard drinks   Drug use: No    Home Medications Prior to Admission medications   Medication Sig Start Date End Date Taking? Authorizing Provider  ALPRAZolam (XANAX) 1 MG tablet TAKE 1 TABLET BY MOUTH 3 TIMES DAILY. 05/21/21  Yes CHoyt Koch MD  carboxymethylcellulose (REFRESH PLUS) 0.5 % SOLN 1 drop 3 (three) times daily as needed. Uses kind with no preservatives Only can see in R eye   Yes [provider]  esomeprazole (NEXIUM) 40 MG capsule TAKE 1 CAPSULE (40 MG TOTAL) BY MOUTH 2 (TWO) TIMES DAILY BEFORE A MEAL. 03/26/21  Yes CHoyt Koch MD  zolpidem (AMBIEN) 10 MG tablet TAKE 1 TO 1 & 1/2 TABLET AT BEDTIME AS NEEDED FOR SLEEP 05/04/21  Yes CHoyt Koch MD  baclofen (LIORESAL) 10 MG tablet Take 10 mg by mouth in the morning and at bedtime.    [provider]  polyethylene glycol (MIRALAX / GLYCOLAX) 17 g packet Take 17 g by mouth daily as needed.     [provider]  sucralfate (CARAFATE) 1 GM/10ML suspension Take 10 mLs (1 g total) by mouth 4 (four) times daily -  with  meals and at bedtime. 10/13/18   CHoyt Koch MD    Allergies    Iodinated diagnostic agents, Other, Shellfish allergy, Gadolinium derivatives, Hydrocodone, Metronidazole, Acetazolamide, Gabapentin, and Mirtazapine  Review of Systems   Review of Systems  Constitutional:  Negative for chills and fever.  HENT:  Negative for ear pain and sore throat.   Eyes:  Negative for pain and visual disturbance.  Respiratory:  Negative for cough and shortness of breath.   Cardiovascular:  Negative for chest pain and palpitations.  Gastrointestinal:  Positive for hematochezia. Negative for abdominal pain, hematemesis and vomiting.  Genitourinary:  Negative for dysuria and hematuria.  Musculoskeletal:  Negative for arthralgias and back pain.  Skin:  Negative for color change and rash.  Neurological:  Positive for dizziness (like she is going to pass out). Negative for seizures and syncope.  All other systems reviewed and are negative.  Physical Exam Updated Vital Signs BP (!) 135/44   Pulse 95   Temp 99.1 F (37.3 C) (Oral)   Resp 18   Ht '5\' 4"'$  (1.626 m)   Wt 103.4 kg   SpO2 99%   BMI 39.14 kg/m   Physical Exam Vitals and nursing note reviewed.  Constitutional:      Appearance: She is well-developed.  HENT:     Head: Normocephalic and atraumatic.  Cardiovascular:     Rate and Rhythm: Normal rate and regular rhythm.     Heart sounds: Normal heart sounds.  Pulmonary:     Effort: Pulmonary effort is normal. No tachypnea.     Breath sounds: Normal breath sounds.  Abdominal:     Palpations: Abdomen is soft.     Tenderness: There is no abdominal tenderness.  Genitourinary:    Comments: Scant black stool in the rectal vault Musculoskeletal:     Right lower leg: No edema.     Left lower leg: No edema.  Skin:    General: Skin is warm and dry.  Neurological:     General: No focal deficit present.     Mental Status: She is alert and oriented to person, place, and time.   Psychiatric:        Mood and Affect: Mood normal.        Behavior: Behavior normal.    ED Results / Procedures / Treatments   Labs (all labs ordered are listed, but only abnormal results are displayed) Labs Reviewed  CBC WITH DIFFERENTIAL/PLATELET - Abnormal; Notable for the following components:      Result Value   RBC 2.39 (*)    Hemoglobin 6.6 (*)    HCT 20.5 (*)    Platelets 144 (*)    All other components within normal limits  COMPREHENSIVE METABOLIC PANEL - Abnormal; Notable for the following components:   Glucose, Bld 100 (*)    BUN 44 (*)    Calcium 7.9 (*)    Total Protein 5.1 (*)    Albumin 3.1 (*)    AST 14 (*)    Total Bilirubin 0.2 (*)    All other components within normal limits  HEMOGLOBIN AND HEMATOCRIT, BLOOD - Abnormal; Notable for the following components:   Hemoglobin 6.1 (*)    HCT 19.2 (*)    All other components within normal limits  OCCULT BLOOD X 1 CARD TO LAB, STOOL - Abnormal; Notable for the following components:   Fecal Occult Bld POSITIVE (*)    All other components within normal limits  RESP PANEL BY RT-PCR (FLU A&B, COVID) ARPGX2  LIPASE, BLOOD  POC OCCULT BLOOD, ED  TROPONIN I (HIGH SENSITIVITY)  TROPONIN I (HIGH SENSITIVITY)    EKG EKG Interpretation  Date/Time:  Tuesday July 28 2021 09:20:58 EDT Ventricular Rate:  92 PR Interval:  145 QRS Duration: 89 QT Interval:  369  QTC Calculation: 457 R Axis:   35 Text Interpretation: Sinus rhythm Consider left atrial enlargement Abnormal R-wave progression, early transition Borderline T wave abnormalities axis normal No acute ischemia Confirmed by Lorre Munroe ([id number]) on 07/28/2021 9:31:05 AM  Radiology CT Abdomen Pelvis Wo Contrast  Result Date: 07/28/2021 CLINICAL DATA:  Bowel obstruction suspected nausea, vomiting EXAM: CT ABDOMEN AND PELVIS WITHOUT CONTRAST TECHNIQUE: Multidetector CT imaging of the abdomen and pelvis was performed following the standard protocol without IV contrast.  COMPARISON:  None. FINDINGS: Lower chest: No acute findings Hepatobiliary: No focal hepatic abnormality. Gallbladder unremarkable. Pancreas: No focal abnormality or ductal dilatation. Spleen: No focal abnormality.  Normal size. Adrenals/Urinary Tract: No adrenal abnormality. No focal renal abnormality. No stones or hydronephrosis. Urinary bladder is unremarkable. Stomach/Bowel: Few scattered sigmoid diverticula. No active diverticulitis. Stomach and small bowel decompressed. No evidence of bowel obstruction. Vascular/Lymphatic: Aortic atherosclerosis. No evidence of aneurysm or adenopathy. Reproductive: Prior hysterectomy.  No adnexal masses. Other: No free fluid or free air. Musculoskeletal: No acute bony abnormality. IMPRESSION: Few scattered sigmoid diverticula.  No active diverticulitis. No acute findings in the abdomen or pelvis. Aortic atherosclerosis. Electronically Signed   By: Rolm Baptise M.D.   On: 07/28/2021 10:11    Procedures .Critical Care  Date/Time: 07/28/2021 10:34 AM Performed by: Arnaldo Natal, MD Authorized by: Arnaldo Natal, MD   Critical care provider statement:    Critical care time (minutes):  45   Critical care time was exclusive of:  Separately billable procedures and treating other patients and teaching time   Critical care was necessary to treat or prevent imminent or life-threatening deterioration of the following conditions: acute GI bleed in need of transfusion.   Critical care was time spent personally by me on the following activities:  Development of treatment plan with patient or surrogate, blood draw for specimens, discussions with consultants, evaluation of patient's response to treatment, examination of patient, obtaining history from patient or surrogate, ordering and performing treatments and interventions, ordering and review of laboratory studies, ordering and review of radiographic studies, re-evaluation of patient's condition and review of old charts   I  assumed direction of critical care for this patient from another provider in my specialty: no     Care discussed with: admitting provider     Medications Ordered in ED Medications  sodium chloride 0.9 % bolus 500 mL (has no administration in time range)  pantoprazole (PROTONIX) 80 mg /NS 100 mL IVPB (has no administration in time range)    ED Course  I have reviewed the triage vital signs and the nursing notes.  Pertinent labs & imaging results that were available during my care of the patient were reviewed by me and considered in my medical decision making (see chart for details).  Clinical Course as of 07/28/21 1421  Tue Jul 28, 2021  1107 I spoke with Dr. Roosevelt Locks of [facility] who will accept the patient.  [AW]  1110 I spoke with Dr. Michail Sermon of GI who is not on call for unassigned.  I spoke with Jaclyn Shaggy of [facility]. Patient has seen Dr. Collene Mares in the past. Requests I call her.   [AW]  1203 I spoke with Dr. Benson Norway, GI who is aware of the patient and will see her when she arrives at [facility]  [AW]  1333 Hemoglobin(!!): 6.1 [AW]  63 [name] was up to the bedside commode when she had a syncopal episode.  She was monitored at all times, but at  that point, she was assisted into bed where she continued to have large volumes of melenic stool.  She was placed on a nonrebreather.  Additional IV access was obtained, and IV fluid was started.  Emergency blood was obtained and hung.  At this point, I talked to Dr. Chase Caller with the ICU.  Agreed to accept the patient to the ICU. [AW]    Clinical Course User Index [AW] Arnaldo Natal, MD   MDM Rules/Calculators/A&P                           [name] arrived to the ED via EMS.  She has been having frequent black stools since yesterday.  Her vital signs were normal, and she was found to have melanotic stool.  CBC showed a hemoglobin of 6.6 and an elevated BUN.  She was given Protonix, and arrangements were made to transfer to [facility].   Coordination was undertaken with GI.  She will need blood when she arrives at [facility].  After the patient was admitted to the floor, she decompensated abruptly.  See documentation above.  She will not be transferred to the ICU. Final Clinical Impression(s) / ED Diagnoses Final diagnoses:  Gastrointestinal hemorrhage with melena  Gastroesophageal reflux disease with esophagitis, unspecified whether hemorrhage  Acute blood loss anemia    Rx / DC Orders ED Discharge Orders     None        Arnaldo Natal, MD 07/28/21 1205    Arnaldo Natal, MD 07/28/21 [id number]

## 2021-07-28 NOTE — ED Notes (Signed)
Patient cleaned of stool and changed into clean gown with clean chux placed under patient.  [facility] here for transport.

## 2021-07-28 NOTE — Progress Notes (Signed)
   Called by RN - because patient indicated to RN  she is DNR  When I enterd room - patient had capacity and deferrred to 2 sisters at bedside - who indicated full code to which patient agreed  Full code    SIGNATURE    Dr. Brand Males, M.D., F.C.C.P,  Pulmonary and Critical Care Medicine Staff Physician, [place] Director - Interstitial Lung Disease  Program  Pulmonary [place] [facility] [place], [place], [address]  NPI Number:  NPI [id number]  Pager: [phone], If no answer  -> Check AMION or Try [phone] Telephone (clinical office): [phone] Telephone (research): [phone]  7:09 PM 07/28/2021

## 2021-07-28 NOTE — H&P (Signed)
NAME:  Gabrielle [name], MRN:  [id number], DOB:  02-Jul-1951, LOS: 0 ADMISSION DATE:  07/28/2021, CONSULTATION DATE:  07/28/21 REFERRING MD:  [facility] , CHIEF COMPLAINT:  GIB   History of Present Illness:  Gabrielle [name] is a 70 y.o. F with past medical history of diverticulosis, GERD, colitis, anemia, anxiety, hypertension, stroke who presented to [facility] ED complaining of nausea and vomiting and black stools for the last 2 days along with lightheadedness.  Her stool was Hemoccult positive, initial hemoglobin 6.6, however she had a near syncopal event while in the ED with additional melanotic stool and repeat hemoglobin was 6.1.  Labwork showed normal renal function and CT abd/pelvis was without acute findings.  She was given IVF and did not require pressors.  She was transferred to [facility] ICU.   Is on no blood thinners or antiplatelet agents, she denies any NSAID use.  No recent abdominal pain or chest pain and states that she has had persistent GERD for many years and is on a PPI for this.  Last colonoscopy was 2 years ago and significant for colon polyps, she also underwent an EGD years ago that showed mucosal changes in the entire esophagus small mucosal ulcer, but no other abnormalities.  Patient states she has never had bleeding like this in the past.  Pertinent  Medical History   has a past medical history of Anemia, Anxiety, Arthritis, Cataracts, bilateral, Chronic insomnia (123456), Complication of anesthesia, Constipation, Diverticulosis, Dizziness, GERD (gastroesophageal reflux disease), Glaucoma, Hearing loss, History of bronchitis, History of colitis, History of colon polyps, Hypertension, Insomnia, Insomnia due to substance (10/11/2014), Joint pain, Joint swelling, PONV (postoperative nausea and vomiting), Stroke ([place]), and Tinnitus.   Significant Hospital Events: Including procedures, antibiotic start and stop dates in addition to other pertinent events   8/2  Admit to PCCM, transfused one unit PRBC's  Interim History / Subjective:  Hemodynamically stable throughout transfer  Objective   Blood pressure (!) 90/53, pulse (!) 106, temperature 99.1 F (37.3 C), temperature source Oral, resp. rate 18, height '5\' 4"'$  (1.626 m), weight 103.4 kg, SpO2 100 %.        Intake/Output Summary (Last 24 hours) at 07/28/2021 1428 Last data filed at 07/28/2021 1053 Gross per 24 hour  Intake 509.23 ml  Output --  Net 509.23 ml   Filed Weights   07/28/21 0924  Weight: 103.4 kg    General: Pale female, well-nourished, awake and anxious appearing but in no distress HEENT: MM pale/moist Neuro: Awake, alert, oriented x3, moving all extremities CV: s1s2 RRR, no m/r/g PULM: Clear bilaterally, no rhonchi wheezing, tachypnea or respiratory distress GI: soft, nontender throughout, nondistended Extremities: warm/dry, no edema  Skin: no rashes or lesions   Resolved Hospital Problem list     Assessment & Plan:    Acute blood loss anemia secondary to upper GI bleed POA With history of persistent GERD, colonic polyps Not on anticoagulation, CT abdomen/pelvis without acute finding Receiving first unit of PRBCs on arrival Blood pressure stable and patient alert and oriented P: -Admit to ICU for close monitoring check serial CBC every 4 hours, transfuse for hemoglobin<7 -Appreciate GI recommendations, keep n.p.o., continue Protonix 40 mg twice daily, plan for EGD in the morning -Maintain 2 large-bore IV's, not currently requiring pressors -Check coags and lactic acid   History of anxiety Takes Xanax 2 mg daily P: -Appears acutely anxious, start alprazolam 1 mg twice daily   Chronic back pain Hold baclofen while  n.p.o.     Best Practice (right click and "Reselect all SmartList Selections" daily)   Diet/type: NPO DVT prophylaxis: SCD GI prophylaxis: PPI Lines: N/A Foley:  N/A Code Status:  full code Last date of multidisciplinary goals of care  discussion [patient's daughter and sister updated at the bedside 8/2]  Labs   CBC: Recent Labs  Lab 07/28/21 0938 07/28/21 1124  WBC 7.3  --   NEUTROABS 5.0  --   HGB 6.6* 6.1*  HCT 20.5* 19.2*  MCV 85.8  --   PLT 144*  --     Basic Metabolic Panel: Recent Labs  Lab 07/28/21 0938  NA 140  K 4.1  CL 108  CO2 26  GLUCOSE 100*  BUN 44*  CREATININE 0.80  CALCIUM 7.9*   GFR: Estimated Creatinine Clearance: 76.6 mL/min (by C-G formula based on SCr of 0.8 mg/dL). Recent Labs  Lab 07/28/21 0938  WBC 7.3    Liver Function Tests: Recent Labs  Lab 07/28/21 0938  AST 14*  ALT 8  ALKPHOS 41  BILITOT 0.2*  PROT 5.1*  ALBUMIN 3.1*   Recent Labs  Lab 07/28/21 0938  LIPASE 20   No results for input(s): AMMONIA in the last 168 hours.  ABG    Component Value Date/Time   TCO2 28 02/15/2010 0927     Coagulation Profile: No results for input(s): INR, PROTIME in the last 168 hours.  Cardiac Enzymes: No results for input(s): CKTOTAL, CKMB, CKMBINDEX, TROPONINI in the last 168 hours.  HbA1C: Hgb A1c MFr Bld  Date/Time Value Ref Range Status  10/06/2018 02:57 PM 5.1 4.6 - 6.5 % Final    Comment:    Glycemic Control Guidelines for People with Diabetes:Non Diabetic:  <6%Goal of Therapy: <7%Additional Action Suggested:  >8%     CBG: No results for input(s): GLUCAP in the last 168 hours.  Review of Systems:   Negative except as noted in HPI  Past Medical History:  She,  has a past medical history of Anemia, Anxiety, Arthritis, Cataracts, bilateral, Chronic insomnia (123456), Complication of anesthesia, Constipation, Diverticulosis, Dizziness, GERD (gastroesophageal reflux disease), Glaucoma, Hearing loss, History of bronchitis, History of colitis, History of colon polyps, Hypertension, Insomnia, Insomnia due to substance (10/11/2014), Joint pain, Joint swelling, PONV (postoperative nausea and vomiting), Stroke ([place]), and Tinnitus.   Surgical History:   Past  Surgical History:  Procedure Laterality Date   ABDOMINAL HYSTERECTOMY     APPENDECTOMY     COLONOSCOPY     IR RADIOLOGIST EVAL & MGMT  06/08/2017   tinnitus Right    TONSILLECTOMY     TOTAL KNEE ARTHROPLASTY Right 03/13/2014   DR MURPHY   TOTAL KNEE ARTHROPLASTY Right 03/13/2014   Procedure: TOTAL KNEE ARTHROPLASTY;  Surgeon: [name], MD;  Location: [facility];  Service: Orthopedics;  Laterality: Right;   TUBAL LIGATION       Social History:   reports that she quit smoking about 27 years ago. Her smoking use included cigarettes. She started smoking about 49 years ago. She has a 22.00 pack-year smoking history. She has never used smokeless tobacco. She reports that she does not drink alcohol and does not use drugs.   Family History:  Her family history includes Cancer - Other in her brother, father, sister, and sister; Depression in her mother; Kidney disease in her father; Lymphoma in her brother.   Allergies Allergies  Allergen Reactions   Iodinated Diagnostic Agents Hives   Other Hives, Other (See Comments) and  Rash   Shellfish Allergy Hives, Other (See Comments) and Rash   Gadolinium Derivatives Hives   Hydrocodone Other (See Comments)    insomnia   Metronidazole Nausea And Vomiting and Other (See Comments)   Acetazolamide Anxiety    Makes head feel strange   Gabapentin Anxiety    unknown   Mirtazapine Other (See Comments)    Dry eyes     Home Medications  Prior to Admission medications   Medication Sig Start Date End Date Taking? Authorizing Provider  ALPRAZolam (XANAX) 1 MG tablet TAKE 1 TABLET BY MOUTH 3 TIMES DAILY. 05/21/21  Yes [name], MD  carboxymethylcellulose (REFRESH PLUS) 0.5 % SOLN 1 drop 3 (three) times daily as needed. Uses kind with no preservatives Only can see in R eye   Yes [provider]  esomeprazole (NEXIUM) 40 MG capsule TAKE 1 CAPSULE (40 MG TOTAL) BY MOUTH 2 (TWO) TIMES DAILY BEFORE A MEAL. 03/26/21  Yes [name], MD  zolpidem (AMBIEN) 10 MG tablet TAKE 1 TO 1 & 1/2 TABLET AT BEDTIME AS NEEDED FOR SLEEP 05/04/21  Yes [name], MD  baclofen (LIORESAL) 10 MG tablet Take 10 mg by mouth in the morning and at bedtime.    [provider]  polyethylene glycol (MIRALAX / GLYCOLAX) 17 g packet Take 17 g by mouth daily as needed.     [provider]  sucralfate (CARAFATE) 1 GM/10ML suspension Take 10 mLs (1 g total) by mouth 4 (four) times daily -  with meals and at bedtime. 10/13/18   [name], MD     Critical care time: 40 minutes    CRITICAL CARE Performed by: Otilio Carpen Sharvi Mooneyhan   Total critical care time: 40 minutes  Critical care time was exclusive of separately billable procedures and treating other patients.  Critical care was necessary to treat or prevent imminent or life-threatening deterioration.  Critical care was time spent personally by me on the following activities: development of treatment plan with patient and/or surrogate as well as nursing, discussions with consultants, evaluation of patient's response to treatment, examination of patient, obtaining history from patient or surrogate, ordering and performing treatments and interventions, ordering and review of laboratory studies, ordering and review of radiographic studies, pulse oximetry and re-evaluation of patient's condition.   Otilio Carpen Zania Kalisz, PA-C [facility] See Amion for pager If no response to pager , please call [phone] until 7pm After 7:00 pm call Elink  [phone]?[place]

## 2021-07-28 NOTE — ED Notes (Signed)
Dr Joya Gaskins has updated pt and visitor with POC. Pt requested something for her nerves, Xanax given after speaking with Dr Joya Gaskins. Pt provided warm blankets.

## 2021-07-28 NOTE — ED Notes (Signed)
Emergency release blood started at 1420, NS 2 L both running at 999/hr. Blood verified by RN x 2, me and Tracey.  Pt A&O x 4, family notified of POC.Karen Chafe at bedside for transfer to ICU at [facility].

## 2021-07-28 NOTE — ED Notes (Signed)
carelink called for report, eta 15 min

## 2021-07-28 NOTE — ED Notes (Signed)
Pt had synopal episode while getting up to bedside commode, provider at bedside.  Pt moved from commode to bed, more GI bleeding noted at this time.  Once in bed pt became alert and oriented.  Pt reports being weak.  Emergency blood ordered.

## 2021-07-28 NOTE — ED Triage Notes (Addendum)
Patient stated that she has been vomiting and nauseous and black stools for 2 days.  She is also complaining  of being dizzy when she gets up.  Patient remembered eating creamy spinach, baked chicken and macaroni and cheese this past Sunday.

## 2021-07-28 NOTE — Consult Note (Signed)
Reason for Consult: Melena Referring Physician: Anemia  Gabrielle [name] HPI: This is a 70 year old female with a PMH GERD and dysphagia.  She presented to the ER today with complaints of melenic stools for the past several days.  As a result she felt dizzy and she had a near syncopal episode.  Her HGB upon admission was at 6.6 g/dL with an MCV at 85.8.  Her baseline HGB was at 13 g/dL.  On 03/29/2018 she underwent an EGD with Dr. Collene Mares for complaints of GERD and dysphagia.  No significant pathology was identified with the esophageal biopsies.  Her colonoscopy on 10/2018 only showed to small adenomas.  Past Medical History:  Diagnosis Date   Anemia    as a child   Anxiety    takes Xanax daily   Arthritis    Cataracts, bilateral    immature   Chronic insomnia [id number]   Complication of anesthesia    Constipation    will occasionally take Milk of Mag   Diverticulosis    Dizziness    rarely   GERD (gastroesophageal reflux disease)    takes Omeprazole every other day   Glaucoma    borderline and no drops required   Hearing loss    but doesn't have hearing aids   History of bronchitis    many many yrs ago   History of colitis    History of colon polyps    Hypertension    hasn't been on meds for the past 57yr    Insomnia    takes Trazodone nightly as needed and Ambien nightly    Insomnia due to substance 10/11/2014   Joint pain    Joint swelling    PONV (postoperative nausea and vomiting)    Stroke (H[facility])    Tinnitus    takes HCTZ daily to decrease pressure in ears    Past Surgical History:  Procedure Laterality Date   ABDOMINAL HYSTERECTOMY     APPENDECTOMY     COLONOSCOPY     IR RADIOLOGIST EVAL & MGMT  06/08/2017   tinnitus Right    TONSILLECTOMY     TOTAL KNEE ARTHROPLASTY Right 03/13/2014   DR MURPHY   TOTAL KNEE ARTHROPLASTY Right 03/13/2014   Procedure: TOTAL KNEE ARTHROPLASTY;  Surgeon: DNinetta Lights MD;  [facility]  Service: Orthopedics;  Laterality:  Right;   TUBAL LIGATION      Family History  Problem Relation Age of Onset   Depression Mother    Cancer - Other Father    Kidney disease Father    Cancer - Other Sister        breast ca   Cancer - Other Brother        lymphoma in remission   Lymphoma Brother    Cancer - Other Sister        in remission breast ca    Social History:  reports that she quit smoking about 27 years ago. Her smoking use included cigarettes. She started smoking about 49 years ago. She has a 22.00 pack-year smoking history. She has never used smokeless tobacco. She reports that she does not drink alcohol and does not use drugs.  Allergies:  Allergies  Allergen Reactions   Iodinated Diagnostic Agents Hives   Other Hives, Other (See Comments) and Rash   Shellfish Allergy Hives, Other (See Comments) and Rash   Gadolinium Derivatives Hives   Hydrocodone Other (See Comments)    insomnia   Metronidazole Nausea And  Vomiting and Other (See Comments)   Acetazolamide Anxiety    Makes head feel strange   Gabapentin Anxiety    unknown   Mirtazapine Other (See Comments)    Dry eyes    Medications: Scheduled:  pantoprazole (PROTONIX) IV  40 mg Intravenous Q12H   Continuous:  Results for orders placed or performed during the hospital encounter of 07/28/21 (from the past 24 hour(s))  CBC with Differential     Status: Abnormal   Collection Time: 07/28/21  9:38 AM  Result Value Ref Range   WBC 7.3 4.0 - 10.5 K/uL   RBC 2.39 (L) 3.87 - 5.11 MIL/uL   Hemoglobin 6.6 (LL) 12.0 - 15.0 g/dL   HCT 20.5 (L) 36.0 - 46.0 %   MCV 85.8 80.0 - 100.0 fL   MCH 27.6 26.0 - 34.0 pg   MCHC 32.2 30.0 - 36.0 g/dL   RDW 14.0 11.5 - 15.5 %   Platelets 144 (L) 150 - 400 K/uL   nRBC 0.0 0.0 - 0.2 %   Neutrophils Relative % 68 %   Neutro Abs 5.0 1.7 - 7.7 K/uL   Lymphocytes Relative 25 %   Lymphs Abs 1.8 0.7 - 4.0 K/uL   Monocytes Relative 6 %   Monocytes Absolute 0.4 0.1 - 1.0 K/uL   Eosinophils Relative 0 %    Eosinophils Absolute 0.0 0.0 - 0.5 K/uL   Basophils Relative 0 %   Basophils Absolute 0.0 0.0 - 0.1 K/uL   Immature Granulocytes 1 %   Abs Immature Granulocytes 0.04 0.00 - 0.07 K/uL  Comprehensive metabolic panel     Status: Abnormal   Collection Time: 07/28/21  9:38 AM  Result Value Ref Range   Sodium 140 135 - 145 mmol/L   Potassium 4.1 3.5 - 5.1 mmol/L   Chloride 108 98 - 111 mmol/L   CO2 26 22 - 32 mmol/L   Glucose, Bld 100 (H) 70 - 99 mg/dL   BUN 44 (H) 8 - 23 mg/dL   Creatinine, Ser 0.80 0.44 - 1.00 mg/dL   Calcium 7.9 (L) 8.9 - 10.3 mg/dL   Total Protein 5.1 (L) 6.5 - 8.1 g/dL   Albumin 3.1 (L) 3.5 - 5.0 g/dL   AST 14 (L) 15 - 41 U/L   ALT 8 0 - 44 U/L   Alkaline Phosphatase 41 38 - 126 U/L   Total Bilirubin 0.2 (L) 0.3 - 1.2 mg/dL   GFR, Estimated >60 >60 mL/min   Anion gap 6 5 - 15  Lipase, blood     Status: None   Collection Time: 07/28/21  9:38 AM  Result Value Ref Range   Lipase 20 11 - 51 U/L  Troponin I (High Sensitivity)     Status: None   Collection Time: 07/28/21  9:39 AM  Result Value Ref Range   Troponin I (High Sensitivity) 6 <18 ng/L  Resp Panel by RT-PCR (Flu A&B, Covid) Nasopharyngeal Swab     Status: None   Collection Time: 07/28/21  9:39 AM   Specimen: Nasopharyngeal Swab; Nasopharyngeal(NP) swabs in vial transport medium  Result Value Ref Range   SARS Coronavirus 2 by RT PCR NEGATIVE NEGATIVE   Influenza A by PCR NEGATIVE NEGATIVE   Influenza B by PCR NEGATIVE NEGATIVE  Troponin I (High Sensitivity)     Status: None   Collection Time: 07/28/21 11:24 AM  Result Value Ref Range   Troponin I (High Sensitivity) 6 <18 ng/L  Occult blood card to lab, stool  Status: Abnormal   Collection Time: 07/28/21 12:09 PM  Result Value Ref Range   Fecal Occult Bld POSITIVE (A) NEGATIVE     CT Abdomen Pelvis Wo Contrast  Result Date: 07/28/2021 CLINICAL DATA:  Bowel obstruction suspected nausea, vomiting EXAM: CT ABDOMEN AND PELVIS WITHOUT CONTRAST  TECHNIQUE: Multidetector CT imaging of the abdomen and pelvis was performed following the standard protocol without IV contrast. COMPARISON:  None. FINDINGS: Lower chest: No acute findings Hepatobiliary: No focal hepatic abnormality. Gallbladder unremarkable. Pancreas: No focal abnormality or ductal dilatation. Spleen: No focal abnormality.  Normal size. Adrenals/Urinary Tract: No adrenal abnormality. No focal renal abnormality. No stones or hydronephrosis. Urinary bladder is unremarkable. Stomach/Bowel: Few scattered sigmoid diverticula. No active diverticulitis. Stomach and small bowel decompressed. No evidence of bowel obstruction. Vascular/Lymphatic: Aortic atherosclerosis. No evidence of aneurysm or adenopathy. Reproductive: Prior hysterectomy.  No adnexal masses. Other: No free fluid or free air. Musculoskeletal: No acute bony abnormality. IMPRESSION: Few scattered sigmoid diverticula.  No active diverticulitis. No acute findings in the abdomen or pelvis. Aortic atherosclerosis. Electronically Signed   By: Rolm Baptise M.D.   On: 07/28/2021 10:11    ROS:  As stated above in the HPI otherwise negative.  Blood pressure (!) 117/54, pulse 94, temperature 99.1 F (37.3 C), temperature source Oral, resp. rate 18, height '5\' 4"'$  (1.626 m), weight 103.4 kg, SpO2 100 %.    PE: Gen: NAD, Alert and Oriented HEENT:  [place]/AT, EOMI Neck: Supple, no LAD Lungs: CTA Bilaterally CV: RRR without M/G/R ABD: Soft, NTND, +BS Ext: No C/C/E  Assessment/Plan: 1) Melena. 2) Symptomatic anemia. 3) GERD.  Plan: 1) EGD  Liboria Putnam D 07/28/2021, 1:06 PM

## 2021-07-28 NOTE — Progress Notes (Signed)
E-link called with lactic 2.5 and Hgb 7.0 results, will continue to monitor and report.

## 2021-07-28 NOTE — Progress Notes (Signed)
eLink Physician-Brief Progress Note Patient Name: Gabrielle [name] DOB: [birth year] MRN: [id number]   Date of Service  07/28/2021  HPI/Events of Note  Anemia - Hgb = 7.0. Lactic Acid = 2.5.  eICU Interventions  Transfuse 1 unit PRBC now.      Intervention Category Major Interventions: Other:;Acid-Base disturbance - evaluation and management  Shany Marinez Cornelia Copa 07/28/2021, 8:05 PM

## 2021-07-28 NOTE — Progress Notes (Signed)
Patient arrived with following belongings- phone, clothes, necklace, sound pillow and small white sound machine.

## 2021-07-28 NOTE — Progress Notes (Signed)
Pt has had a large dark bloody bowel movement and SBP dropped to 90's E-link aware. Called blood bank for status of blood to be given.

## 2021-07-29 ENCOUNTER — Encounter (HOSPITAL_COMMUNITY): Payer: Self-pay | Admitting: Internal Medicine

## 2021-07-29 ENCOUNTER — Inpatient Hospital Stay (HOSPITAL_COMMUNITY): Payer: No Typology Code available for payment source | Admitting: Certified Registered Nurse Anesthetist

## 2021-07-29 ENCOUNTER — Encounter (HOSPITAL_COMMUNITY)
Admission: EM | Disposition: A | Discharge status: Home / Self Care | Payer: Self-pay | Source: Home / Self Care | Attending: Internal Medicine

## 2021-07-29 DIAGNOSIS — K922 Gastrointestinal hemorrhage, unspecified: Secondary | ICD-10-CM

## 2021-07-29 HISTORY — PX: POLYPECTOMY: SHX5525

## 2021-07-29 HISTORY — PX: HEMOSTASIS CLIP PLACEMENT: SHX6857

## 2021-07-29 HISTORY — PX: ESOPHAGOGASTRODUODENOSCOPY (EGD) WITH PROPOFOL: SHX5813

## 2021-07-29 LAB — BASIC METABOLIC PANEL
Anion gap: 3 — ABNORMAL LOW (ref 5–15)
BUN: 42 mg/dL — ABNORMAL HIGH (ref 8–23)
CO2: 23 mmol/L (ref 22–32)
Calcium: 7.4 mg/dL — ABNORMAL LOW (ref 8.9–10.3)
Chloride: 117 mmol/L — ABNORMAL HIGH (ref 98–111)
Creatinine, Ser: 0.89 mg/dL (ref 0.44–1.00)
GFR, Estimated: >60 mL/min (ref >60)
Glucose, Bld: 128 mg/dL — ABNORMAL HIGH (ref 70–99)
Potassium: 4.1 mmol/L (ref 3.5–5.1)
Sodium: 143 mmol/L (ref 135–145)

## 2021-07-29 LAB — URINALYSIS, ROUTINE W REFLEX MICROSCOPIC
Bilirubin Urine: NEGATIVE
Glucose, UA: NEGATIVE mg/dL
Ketones, ur: NEGATIVE mg/dL
Nitrite: NEGATIVE
Protein, ur: NEGATIVE mg/dL
Specific Gravity, Urine: 1.02 (ref 1.005–1.030)
pH: 5 (ref 5.0–8.0)

## 2021-07-29 LAB — BPAM RBC
Blood Product Expiration Date: 202208302359
ISSUE DATE / TIME: 202208021413
Unit Type and Rh: 9500

## 2021-07-29 LAB — TYPE AND SCREEN
ABO/RH(D): O POS
Antibody Screen: NEGATIVE
Unit division: 0

## 2021-07-29 LAB — CBC
HCT: 22.2 % — ABNORMAL LOW (ref 36.0–46.0)
HCT: 25.3 % — ABNORMAL LOW (ref 36.0–46.0)
Hemoglobin: 7 g/dL — ABNORMAL LOW (ref 12.0–15.0)
Hemoglobin: 8.1 g/dL — ABNORMAL LOW (ref 12.0–15.0)
MCH: 28.1 pg (ref 26.0–34.0)
MCH: 29.1 pg (ref 26.0–34.0)
MCHC: 31.5 g/dL (ref 30.0–36.0)
MCHC: 32 g/dL (ref 30.0–36.0)
MCV: 89.2 fL (ref 80.0–100.0)
MCV: 91 fL (ref 80.0–100.0)
Platelets: 125 K/uL — ABNORMAL LOW (ref 150–400)
Platelets: 117 10*3/uL — ABNORMAL LOW (ref 150–400)
RBC: 2.49 MIL/uL — ABNORMAL LOW (ref 3.87–5.11)
RBC: 2.78 MIL/uL — ABNORMAL LOW (ref 3.87–5.11)
RDW: 15.7 % — ABNORMAL HIGH (ref 11.5–15.5)
RDW: 15.9 % — ABNORMAL HIGH (ref 11.5–15.5)
WBC: 10.6 K/uL — ABNORMAL HIGH (ref 4.0–10.5)
WBC: 9.6 10*3/uL (ref 4.0–10.5)
nRBC: 0 % (ref 0.0–0.2)
nRBC: 0 % (ref 0.0–0.2)

## 2021-07-29 LAB — GLUCOSE, CAPILLARY
Glucose-Capillary: 103 mg/dL — ABNORMAL HIGH (ref 70–99)
Glucose-Capillary: 104 mg/dL — ABNORMAL HIGH (ref 70–99)
Glucose-Capillary: 119 mg/dL — ABNORMAL HIGH (ref 70–99)
Glucose-Capillary: 89 mg/dL (ref 70–99)
Glucose-Capillary: 93 mg/dL (ref 70–99)
Glucose-Capillary: 99 mg/dL (ref 70–99)

## 2021-07-29 LAB — PREPARE RBC (CROSSMATCH)

## 2021-07-29 LAB — HEMOGLOBIN AND HEMATOCRIT, BLOOD
HCT: 28.4 % — ABNORMAL LOW (ref 36.0–46.0)
Hemoglobin: 8.9 g/dL — ABNORMAL LOW (ref 12.0–15.0)

## 2021-07-29 LAB — MAGNESIUM: Magnesium: 2 mg/dL (ref 1.7–2.4)

## 2021-07-29 LAB — PHOSPHORUS: Phosphorus: 3.1 mg/dL (ref 2.5–4.6)

## 2021-07-29 SURGERY — ESOPHAGOGASTRODUODENOSCOPY (EGD) WITH PROPOFOL
Anesthesia: Monitor Anesthesia Care

## 2021-07-29 MED ORDER — PROPOFOL 500 MG/50ML IV EMUL
INTRAVENOUS | Status: AC
  Filled 2021-07-29: qty 50

## 2021-07-29 MED ORDER — PROPOFOL 10 MG/ML IV BOLUS
INTRAVENOUS | Status: DC | PRN
  Administered 2021-07-29 (×2): 10 mg via INTRAVENOUS
  Administered 2021-07-29: 20 mg via INTRAVENOUS

## 2021-07-29 MED ORDER — PROPOFOL 500 MG/50ML IV EMUL
INTRAVENOUS | Status: DC | PRN
  Administered 2021-07-29: 125 ug/kg/min via INTRAVENOUS

## 2021-07-29 MED ORDER — LORAZEPAM 2 MG/ML IJ SOLN
1.0000 mg | INTRAMUSCULAR | Status: AC | PRN
  Administered 2021-07-30 – 2021-07-31 (×5): 1 mg via INTRAVENOUS
  Filled 2021-07-29 (×5): qty 1

## 2021-07-29 MED ORDER — LACTATED RINGERS IV SOLN
INTRAVENOUS | Status: DC | PRN
Start: 2021-07-29 — End: 2021-07-29

## 2021-07-29 MED ORDER — ONDANSETRON HCL 4 MG/2ML IJ SOLN
INTRAMUSCULAR | Status: DC | PRN
  Administered 2021-07-29: 4 mg via INTRAVENOUS

## 2021-07-29 MED ORDER — ONDANSETRON HCL 4 MG/2ML IJ SOLN
4.0000 mg | Freq: Four times a day (QID) | INTRAMUSCULAR | Status: DC | PRN
  Filled 2021-07-29: qty 2

## 2021-07-29 MED ORDER — SODIUM CHLORIDE 0.9% IV SOLUTION: Freq: Once | INTRAVENOUS | Status: DC

## 2021-07-29 MED ORDER — LIDOCAINE 2% (20 MG/ML) 5 ML SYRINGE
INTRAMUSCULAR | Status: DC | PRN
  Administered 2021-07-29: 60 mg via INTRAVENOUS

## 2021-07-29 SURGICAL SUPPLY — 15 items

## 2021-07-29 NOTE — Progress Notes (Signed)
Pt continues to have small, frequent, type 6 tarry stools. CCM and GI aware. Pt has received 2 units of blood this AM, awaiting repeat hgb results. Per endo RN, pt will be taken downstairs around 12:30 for procedure. (Endo RN also said physician will get consent from pt downstairs).   VS remain stable at this time, RN will continue to carefully monitor pt.

## 2021-07-29 NOTE — Progress Notes (Addendum)
NAME:  Gabrielle [name], MRN:  [id number], DOB:  [birth year], LOS: 1 ADMISSION DATE:  07/28/2021, CONSULTATION DATE:  07/29/21 REFERRING MD:  [place] , CHIEF COMPLAINT:  GIB   History of Present Illness:  Gabrielle [name] is a 70 y.o. F with past medical history of diverticulosis, GERD, colitis, anemia, anxiety, hypertension, stroke who presented to [facility] ED complaining of nausea and vomiting and black stools for the last 2 days along with lightheadedness.  Her stool was Hemoccult positive, initial hemoglobin 6.6, however she had a near syncopal event while in the ED with additional melanotic stool and repeat hemoglobin was 6.1.  Labwork showed normal renal function and CT abd/pelvis was without acute findings.  She was given IVF and did not require pressors.  She was transferred to [facility] ICU.   Is on no blood thinners or antiplatelet agents, she denies any NSAID use.  No recent abdominal pain or chest pain and states that she has had persistent GERD for many years and is on a PPI for this.  Last colonoscopy was 2 years ago and significant for colon polyps, she also underwent an EGD years ago that showed mucosal changes in the entire esophagus small mucosal ulcer, but no other abnormalities.  Patient states she has never had bleeding like this in the past.  Pertinent  Medical History   has a past medical history of Anemia, Anxiety, Arthritis, Cataracts, bilateral, Chronic insomnia (123456), Complication of anesthesia, Constipation, Diverticulosis, Dizziness, GERD (gastroesophageal reflux disease), Glaucoma, Hearing loss, History of bronchitis, History of colitis, History of colon polyps, Hypertension, Insomnia, Insomnia due to substance (10/11/2014), Joint pain, Joint swelling, PONV (postoperative nausea and vomiting), Stroke ([place]), and Tinnitus.   Significant Hospital Events: Including procedures, antibiotic start and stop dates in addition to other pertinent events   8/2  Admit to PCCM, transfused one unit PRBC's  Interim History / Subjective:  HDS 2 PRBC overnight Complaining of thirst, hunger, not wanting to be NPO, frustrated that she has not had EGD yet this morning   Wants PCCM to tell the patient if the patient is feeling dizzy or not.   Feels that output volume and frequency is decreasing   Objective   Blood pressure (!) 151/85, pulse 94, temperature 99 F (37.2 C), temperature source Axillary, resp. rate 17, height '5\' 4"'$  (1.626 m), weight 103.4 kg, SpO2 100 %.        Intake/Output Summary (Last 24 hours) at 07/29/2021 0855 Last data filed at 07/29/2021 0745 Gross per 24 hour  Intake 1586.03 ml  Output --  Net 1586.03 ml   Filed Weights   07/28/21 0924  Weight: 103.4 kg    General: wdwn obese older adult F reclined in bed NAD  HEENT: NCAT pink mmm anicteric sclera  Neuro: AAOx4 following commands PERRL CV: rrr s1s2 no rgm 2+ radial pulse cap refill brisk  PULM: CTAb no accessory use even unlabored on RA  GI: soft round ndnt normoactive x4 Extremities: no acute abnormalities  Skin: c/d/w no rash    Resolved Hospital Problem list     Assessment & Plan:   ABLA Upper GIB Hx GERD Hx Colonic polyps -has been HDS. Got 2PRBC on arrival, 2 PRBC 8/3  -output quantity is slowing  P: -GI planning ECG 8/3 afternoon -NPO for EGD  -BID protonix  -cont IVF  -has remained HDS -- if stable following EGD, will consider transfer out of ICU   Elevated lactic acid -in setting of  GIB P -don't feel there is a great utility in trending further. Pt remains HDS   Hx Chronic pain  Hx Anxiety Takes Xanax 2 mg daily P: -PRN IV ativan  -environmental interventions to decr anxiety, which seem to be heightened during this NPO period  -holding home baclofen while NPO   Best Practice (right click and "Reselect all SmartList Selections" daily)   Diet/type: NPO DVT prophylaxis: SCD GI prophylaxis: PPI Lines: N/A Foley:  N/A Code Status:   full code Last date of multidisciplinary goals of care discussion [patient's daughter and sister updated at the bedside 8/2] Dispo: pending stability following EGD. Possible transfer out of ICU later 8/3   Labs   CBC: Recent Labs  Lab 07/28/21 0938 07/28/21 1124 07/28/21 1845 07/29/21 0236  WBC 7.3  --  10.9* 10.6*  NEUTROABS 5.0  --   --   --   HGB 6.6* 6.1* 7.0* 7.0*  HCT 20.5* 19.2* 22.2* 22.2*  MCV 85.8  --  90.2 89.2  PLT 144*  --  137* 125*    Basic Metabolic Panel: Recent Labs  Lab 07/28/21 0938 07/29/21 0236  NA 140 143  K 4.1 4.1  CL 108 117*  CO2 26 23  GLUCOSE 100* 128*  BUN 44* 42*  CREATININE 0.80 0.89  CALCIUM 7.9* 7.4*  MG  --  2.0  PHOS  --  3.1   GFR: Estimated Creatinine Clearance: 68.9 mL/min (by C-G formula based on SCr of 0.89 mg/dL). Recent Labs  Lab 07/28/21 0938 07/28/21 1845 07/29/21 0236  WBC 7.3 10.9* 10.6*  LATICACIDVEN  --  2.5*  --     Liver Function Tests: Recent Labs  Lab 07/28/21 0938  AST 14*  ALT 8  ALKPHOS 41  BILITOT 0.2*  PROT 5.1*  ALBUMIN 3.1*   Recent Labs  Lab 07/28/21 0938  LIPASE 20   No results for input(s): AMMONIA in the last 168 hours.  ABG    Component Value Date/Time   TCO2 28 02/15/2010 0927     Coagulation Profile: Recent Labs  Lab 07/28/21 1845  INR 1.2    Cardiac Enzymes: No results for input(s): CKTOTAL, CKMB, CKMBINDEX, TROPONINI in the last 168 hours.  HbA1C: Hgb A1c MFr Bld  Date/Time Value Ref Range Status  07/28/2021 06:45 PM 5.2 4.8 - 5.6 % Final    Comment:    (NOTE) Pre diabetes:          5.7%-6.4%  Diabetes:              >6.4%  Glycemic control for   <7.0% adults with diabetes   10/06/2018 02:57 PM 5.1 4.6 - 6.5 % Final    Comment:    Glycemic Control Guidelines for People with Diabetes:Non Diabetic:  <6%Goal of Therapy: <7%Additional Action Suggested:  >8%     CBG: Recent Labs  Lab 07/28/21 1619 07/28/21 1943 07/28/21 2325 07/29/21 0351  07/29/21 0802  GLUCAP 92 98 118* 119* 103*    CCT: n/a    Eliseo Gum MSN, AGACNP-BC [place] for pager  07/29/2021, 8:55 AM  Xxxxx   ATTESTATION & SIGNATURE   STAFF NOTE: I, Dr Ann Lions have personally reviewed patient's available data, including medical history, events of note, physical examination and test results as part of my evaluation. I have discussed with resident/NP and other care providers such as pharmacist, RN and RRT.  In addition,  I personally evaluated patient and elicited key findings of  S: Date of admit 07/28/2021 with LOS 1 for today 07/29/2021 : Gabrielle [name] is -on and off melena.  She is received 2 units of blood since last night.  Including 1 this morning.  Hemodynamically stable.  Endoscopy scheduled for an hour or 2 from now.  Overall feeling stable.  She is anxious to start healing again.  O:  Blood pressure (!) 149/74, pulse 86, temperature 99 F (37.2 C), temperature source Axillary, resp. rate 20, height '5\' 4"'$  (1.626 m), weight 103.4 kg, SpO2 100 %.   No distress.  Color looks somewhat better than yesterday but still looks somewhat pale.  Alert and oriented x3 abdomen is soft nontender no organomegaly no rebound no tenderness.  No rigidity.  Moves all 4 extremities.  Clear to auscultation normal heart sounds    A: Near syncope with hemodynamic instability circulatory shock secondary to hemorrhage  -shock but is resolved.  This was present at admission  Hemorrhages GI bleed likely upper based on melena -present at admission likely ongoing intermittently and slow  Right upper quadrant ultrasound without evidence of cirrhosis  P: Continue ICU status Await endoscopy-if results show low risk of major bleeding then can move out of the ICU. - PRBC for hgb </= 6.9gm%    - exceptions are   -  if ACS susepcted/confirmed then transfuse for hgb </= 8.0gm%,  or    -  active bleeding with hemodynamic instability, then  transfuse regardless of hemoglobin value   At at all times try to transfuse 1 unit prbc as possible with exception of active hemorrhage     Anti-infectives (From admission, onward)    None         SIGNATURE    Dr. Brand Males, M.D., F.C.C.P,  Pulmonary and Critical Care Medicine Staff Physician, [facility] Director - Interstitial Lung Disease  Program  Pulmonary [place] at [place], [place], [address]  NPI Number:  NPI [id number]  Pager: [phone], If no answer  -> Check AMION or Try [phone] Telephone (clinical office): [phone] Telephone (research): [phone]  11:38 AM 07/29/2021

## 2021-07-29 NOTE — Anesthesia Procedure Notes (Signed)
Procedure Name: MAC Date/Time: 07/29/2021 2:04 PM Performed by: Maxwell Caul, CRNA Pre-anesthesia Checklist: Patient identified, Emergency Drugs available, Suction available and Patient being monitored Oxygen Delivery Method: Simple face mask

## 2021-07-29 NOTE — Anesthesia Postprocedure Evaluation (Signed)
Anesthesia Post Note  Patient: REHA MARTINOVICH  Procedure(s) Performed: ESOPHAGOGASTRODUODENOSCOPY (EGD) WITH PROPOFOL POLYPECTOMY HEMOSTASIS CLIP PLACEMENT     Patient location during evaluation: PACU Anesthesia Type: MAC Level of consciousness: awake and alert Pain management: pain level controlled Vital Signs Assessment: post-procedure vital signs reviewed and stable Respiratory status: spontaneous breathing, nonlabored ventilation, respiratory function stable and patient connected to nasal cannula oxygen Cardiovascular status: stable and blood pressure returned to baseline Postop Assessment: no apparent nausea or vomiting Anesthetic complications: no   No notable events documented.  Last Vitals:  Vitals:   07/29/21 1453 07/29/21 1500  BP: 121/70 108/63  Pulse: 83 80  Resp: (!) 23 17  Temp: (!) 36.1 C   SpO2: 100% 100%    Last Pain:  Vitals:   07/29/21 1510  TempSrc:   PainSc: 0-No pain                 Ayse Mccartin

## 2021-07-29 NOTE — Progress Notes (Signed)
[facility] Progress Note Patient Name: Gabrielle [name] DOB: 1950/12/29 MRN: [id number]   Date of Service  07/29/2021  HPI/Events of Note  Anemia - Hgb remains = 7.0 post 1 unit PRBC. Apparently had large bloody BM around 10 PM.   eICU Interventions  Plan: Will transfuse 1 unit PRBC.     Intervention Category Major Interventions: Other:  Gabrielle [name] Cornelia Copa 07/29/2021, 4:12 AM

## 2021-07-29 NOTE — Transfer of Care (Signed)
Immediate Anesthesia Transfer of Care Note  Patient: Gabrielle [name]  Procedure(s) Performed: ESOPHAGOGASTRODUODENOSCOPY (EGD) WITH PROPOFOL POLYPECTOMY HEMOSTASIS CLIP PLACEMENT  Patient Location: PACU and Endoscopy Unit  Anesthesia Type:MAC  Level of Consciousness: awake, alert  and oriented  Airway & Oxygen Therapy: Patient Spontanous Breathing and Patient connected to face mask oxygen  Post-op Assessment: Report given to RN and Post -op Vital signs reviewed and stable  Post vital signs: Reviewed and stable  Last Vitals:  Vitals Value Taken Time  BP    Temp    Pulse    Resp    SpO2      Last Pain:  Vitals:   07/29/21 1453  TempSrc: Oral  PainSc: 0-No pain         Complications: No notable events documented.

## 2021-07-29 NOTE — Interval H&P Note (Signed)
History and Physical Interval Note:  07/29/2021 2:05 PM  Gabrielle [name]  has presented today for surgery, with the diagnosis of Anemia and melena.  The various methods of treatment have been discussed with the patient and family. After consideration of risks, benefits and other options for treatment, the patient has consented to  Procedure(s): ESOPHAGOGASTRODUODENOSCOPY (EGD) WITH PROPOFOL (N/A) as a surgical intervention.  The patient's history has been reviewed, patient examined, no change in status, stable for surgery.  I have reviewed the patient's chart and labs.  Questions were answered to the patient's satisfaction.     [name]

## 2021-07-29 NOTE — Op Note (Signed)
[facility] Patient Name: Gabrielle [name] Procedure Date: 07/29/2021 MRN: [id number] Attending MD: [name] , MD Date of Birth: March 31, 1951 CSN: [id number] Age: 70 Admit Type: Inpatient Procedure:                EGD with hot snare polypectomy x 1 & control of                            bleeding with clips x 3. Indications:              Iron deficiency anemia, Melena, Gastro-esophageal                            reflux disease. Providers:                [name], MD, Particia Nearing, RN, Cherylynn Ridges,                            Technician, Virgia Land, CRNA Referring MD:             Real Cons. Crawford MD Medicines:                Monitored Anesthesia Care Complications:            No immediate complications. Estimated Blood Loss:     Estimated blood loss was minimal. Procedure:                Pre-Anesthesia Assessment: - Prior to the                            procedure, a history and physical was performed,                            and patient medications and allergies were                            reviewed. The patient's tolerance of previous                            anesthesia was also reviewed. The risks and                            benefits of the procedure and the sedation options                            and risks were discussed with the patient. All                            questions were answered, and informed consent was                            obtained. Prior Anticoagulants: The patient has                            taken no previous anticoagulant or antiplatelet  agents. ASA Grade Assessment: III - A patient with                            severe systemic disease. After reviewing the risks                            and benefits, the patient was deemed in                            satisfactory condition to undergo the procedure.                            After obtaining informed consent, the endoscope was                             passed under direct vision. Throughout the                            procedure, the patient's blood pressure, pulse, and                            oxygen saturations were monitored continuously. The                            [id number]-H190 ([id number]) Olympus endoscope was introduced                            through the mouth, and advanced to the second part                            of duodenum. The EGD was performed with moderate                            difficulty. The patient tolerated the procedure                            well. Scope In: Scope Out: Findings:      The examined esophagus and GEJ appeared widely patent and normal.      A single 20 mm sessile polyp with patchy ulceration and with stigmata of       recent bleeding was found in the gastric body along the GC ; some fresh       heme was noted in the stomach; the polyp was removed with a hot snare x       1-200/20; resection and retrieval were complete; to stop       post-polypectomy bleeding, hemostatic clips were deployed x 4; 3 were       deployed successfully and one clip dislodged. There was no bleeding at       the end of the procedure.      The cardia and gastric fundus were normal on retroflexion.      The examined duodenum was normal. Impression:               - Normal appearing, widely patent esophagus and GEJ.                           -  A 2 cm single, sessile gastric polyp along the                            greater curvature in the mid-body removed by a hot                            snare x 1; resected and retrieved; 3 hemostatic                            clips applied successfully.                           - The rest of the stomach appeared normal; some                            fresh heme was noted in the stomach.                           - Normal examined duodenum. Moderate Sedation:      MAC used. Recommendation:           - Ice chips for 4 hours followed by a clear liquid                             diet today.                           - Continue present medications.                           - Check a repeat CBC in 6 hours.                           - No Ibuprofen, Naproxen, or other non-steroidal                            anti-inflammatory drugs for 2 weeks after polyp                            removal.                           - Return to my office in 4 weeks. Procedure Code(s):        --- Professional ---                           [phone], Esophagogastroduodenoscopy, flexible,                            transoral; with control of bleeding, any method                           43251, 59, Esophagogastroduodenoscopy, flexible,                            transoral; with removal of tumor(s),  polyp(s), or                            other lesion(s) by snare technique Diagnosis Code(s):        --- Professional ---                           D50.9, Iron deficiency anemia, unspecified                           K92.1, Melena (includes Hematochezia)                           K21.9, Gastro-esophageal reflux disease without                            esophagitis                           K31.7, Polyp of stomach and duodenum CPT copyright 2019 [employer]. All rights reserved. The codes documented in this report are preliminary and upon coder review may  be revised to meet current compliance requirements. [name], MD [name], MD 07/29/2021 3:13:46 PM This report has been signed electronically. Number of Addenda: 0

## 2021-07-29 NOTE — H&P (View-Only) (Signed)
Pt continues to have small, frequent, type 6 tarry stools. CCM and GI aware. Pt has received 2 units of blood this AM, awaiting repeat hgb results. Per endo RN, pt will be taken downstairs around 12:30 for procedure. (Endo RN also said physician will get consent from pt downstairs).   VS remain stable at this time, RN will continue to carefully monitor pt.

## 2021-07-29 NOTE — Progress Notes (Signed)
[facility] Progress Note Patient Name: Gabrielle [name] DOB: [birth year] MRN: [id number]   Date of Service  07/29/2021  HPI/Events of Note  Multiple issues: 1. Anxiety - Nursing request to renew Ativan PRN order. 2. Nausea - QTc interval = 0.42.   eICU Interventions  Plan: Ativan 1 mg IV Q 4 hours PRN anxiety X 5 doses. Zofran 4 mg IV Q 4 hours PRN N/V.     Intervention Category Major Interventions: Delirium, psychosis, severe agitation - evaluation and management;Other:  Lysle Dingwall 07/29/2021, 10:37 PM

## 2021-07-29 NOTE — TOC Initial Note (Addendum)
Transition of Care [facility]) - Initial/Assessment Note    Patient Details  Name: Gabrielle [name] MRN: [id number] Date of Birth: July 18, 1951  Transition of Care [facility]) CM/SW Contact:    Leeroy Cha, RN Phone Number: 07/29/2021, 7:43 AM  Clinical Narrative:                 : Date of admit 07/28/2021 with LOS 0 for today 07/28/2021 : Gabrielle [name] is  - presents with melena and near syncope. In 2019 she had clear EGD. She is on PPI. Denies NSAID . Denies etoh.    has a past medical history of Anemia, Anxiety, Arthritis, Cataracts, bilateral, Chronic insomnia (123456), Complication of anesthesia, Constipation, Diverticulosis, Dizziness, GERD (gastroesophageal reflux disease), Glaucoma, Hearing loss, History of bronchitis, History of colitis, History of colon polyps, Hypertension, Insomnia, Insomnia due to substance (10/11/2014), Joint pain, Joint swelling, PONV (postoperative nausea and vomiting), Stroke ([place]), and Tinnitus.    has a past surgical history that includes Abdominal hysterectomy; Tonsillectomy; Appendectomy; Tubal ligation; Colonoscopy; Total knee arthroplasty (Right, 03/13/2014); Total knee arthroplasty (Right, 03/13/2014); tinnitus (Right); and IR Radiologist Eval & Mgmt (06/08/2017).     O: Blood pressure (!) 149/53, pulse 93, temperature 98.2 F (36.8 C), temperature source Oral, resp. rate 17, height '5\' 4"'$  (1.626 m), weight 103.4 kg, SpO2 100 %.     Obese Pale No distress BP ok now Abd soft CTA bilaterally  TOC PLAN OF CARE:  following for toc needs and progression, hgb 7.0-rec'd one unit prbc 02 at 2l/[place]  lives alone should be able to return to home.   Expected Discharge Plan: Home/Self Care Barriers to Discharge: Continued Medical Work up   Patient Goals and CMS Choice Patient states their goals for this hospitalization and ongoing recovery are:: to go home CMS Medicare.gov Compare Post Acute Care list provided to:: Patient    Expected Discharge Plan and  Services Expected Discharge Plan: Home/Self Care   Discharge Planning Services: CM Consult   Living arrangements for the past 2 months: Apartment                                      Prior Living Arrangements/Services Living arrangements for the past 2 months: Apartment Lives with:: Self Patient language and need for interpreter reviewed:: Yes Do you feel safe going back to the place where you live?: Yes            Criminal Activity/Legal Involvement Pertinent to Current Situation/Hospitalization: No - Comment as needed  Activities of Daily Living Home Assistive Devices/Equipment: Eyeglasses ADL Screening (condition at time of admission) Patient's cognitive ability adequate to safely complete daily activities?: Yes Is the patient deaf or have difficulty hearing?: No Does the patient have difficulty seeing, even when wearing glasses/contacts?: No Does the patient have difficulty concentrating, remembering, or making decisions?: No Patient able to express need for assistance with ADLs?: Yes Does the patient have difficulty dressing or bathing?: Yes Independently performs ADLs?: No Communication: Independent Dressing (OT): Needs assistance Is this a change from baseline?: Change from baseline, expected to last >3 days Grooming: Needs assistance Is this a change from baseline?: Change from baseline, expected to last >3 days Feeding: Needs assistance Is this a change from baseline?: Change from baseline, expected to last >3 days Bathing: Needs assistance Is this a change from baseline?: Change from baseline, expected to last >3 days Toileting: Needs assistance Is  this a change from baseline?: Change from baseline, expected to last >3days In/Out Bed: Needs assistance Is this a change from baseline?: Change from baseline, expected to last >3 days Walks in Home: Needs assistance Is this a change from baseline?: Change from baseline, expected to last >3 days Does the  patient have difficulty walking or climbing stairs?: Yes (secondary to weakness) Weakness of Legs: Both Weakness of Arms/Hands: None  Permission Sought/Granted                  Emotional Assessment Appearance:: Appears stated age     Orientation: : Oriented to Self, Oriented to Place, Oriented to  Time, Oriented to Situation Alcohol / Substance Use: Not Applicable Psych Involvement: No (comment)  Admission diagnosis:  Acute blood loss anemia [D62] Rectal bleeding [K62.5] Lower GI bleed [K92.2] Gastrointestinal hemorrhage with melena [K92.1] Gastroesophageal reflux disease with esophagitis, unspecified whether hemorrhage [K21.00] Patient Active Problem List   Diagnosis Date Noted   Lower GI bleed 07/28/2021   Rectal bleeding 07/28/2021   Acute blood loss anemia    Gastrointestinal hemorrhage with melena    Leg swelling 07/09/2021   Right hip pain 05/13/2021   Acute bilateral low back pain with right-sided sciatica 05/13/2021   Hives 04/09/2021   Posterior vitreous detachment of both eyes 02/25/2021   Optic nerve atrophy 02/25/2021   Pseudophakia 02/25/2021   Vitreous membranes and strands, right eye 02/25/2021   Right thigh pain 01/22/2021   Hearing loss 08/14/2020   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   Essential (primary) hypertension 08/14/2020   Depression 08/14/2020   GAD (generalized anxiety disorder) 08/14/2020   Morbid obesity ([place]) 05/01/2020   Family history of colon cancer 05/01/2020   Diverticular disease of colon 05/01/2020   Chronic idiopathic constipation 05/01/2020   Decreased vision of left eye 02/28/2020   Elevated blood pressure reading in office without diagnosis of hypertension 12/15/2018   Blindness 09/14/2018   GERD (gastroesophageal reflux disease) 04/21/2018   TMJ pain dysfunction syndrome 03/23/2018   Pulsatile tinnitus of both ears 03/23/2018   Chronic post-traumatic stress disorder (PTSD) 03/14/2018   Tinnitus 03/14/2018    Routine general medical examination at a health care facility 03/14/2018   Left ear pain 05/13/2017   Presbycusis of both ears 03/30/2017   Abnormal auditory perception of both ears 06/03/2016   Neck pain 03/12/2016   Abnormality of gait 06/03/2015   S/P TKR (total knee replacement) 06/03/2015   Chronic left SI joint pain 06/03/2015   Insomnia 10/11/2014   Drug-induced sleep disorder ([place]) 10/11/2014   DJD (degenerative joint disease) of knee 03/13/2014   Sensorineural hearing loss, bilateral 11/27/2013   PCP:  [name], MD Pharmacy:   [id number] [id number] [id number]: [phone]: [phone]    Social Determinants of Health (SDOH) Interventions    Readmission Risk Interventions No flowsheet data found.

## 2021-07-29 NOTE — Anesthesia Preprocedure Evaluation (Addendum)
Anesthesia Evaluation  Patient identified by MRN, date of birth, ID band Patient awake    Reviewed: Allergy & Precautions, H&P , NPO status , Patient's Chart, lab work & pertinent test results, reviewed documented beta blocker date and time   History of Anesthesia Complications (+) PONV and history of anesthetic complications  Airway Mallampati: II  TM Distance: >3 FB Neck ROM: full    Dental no notable dental hx. (+) Teeth Intact, Poor Dentition, Missing, Dental Advisory Given, Chipped   Pulmonary neg pulmonary ROS, former smoker,    Pulmonary exam normal breath sounds clear to auscultation       Cardiovascular Exercise Tolerance: Good hypertension, Pt. on medications negative cardio ROS   Rhythm:regular Rate:Normal     Neuro/Psych Anxiety Depression CVA negative psych ROS   GI/Hepatic Neg liver ROS, GERD  Medicated,  Endo/Other  negative endocrine ROS  Renal/GU negative Renal ROS  negative genitourinary   Musculoskeletal  (+) Arthritis , Osteoarthritis,    Abdominal   Peds  Hematology  (+) Blood dyscrasia, anemia ,   Anesthesia Other Findings   Reproductive/Obstetrics negative OB ROS                            Anesthesia Physical Anesthesia Plan  ASA: 4  Anesthesia Plan: MAC   Post-op Pain Management:    Induction: Intravenous  PONV Risk Score and Plan: 2  Airway Management Planned: Mask, Natural Airway and Nasal Cannula  Additional Equipment: None  Intra-op Plan:   Post-operative Plan:   Informed Consent: I have reviewed the patients History and Physical, chart, labs and discussed the procedure including the risks, benefits and alternatives for the proposed anesthesia with the patient or authorized representative who has indicated his/her understanding and acceptance.     Dental Advisory Given  Plan Discussed with: CRNA and Anesthesiologist  Anesthesia Plan  Comments:         Anesthesia Quick Evaluation

## 2021-07-30 ENCOUNTER — Encounter (HOSPITAL_COMMUNITY): Payer: Self-pay | Admitting: Gastroenterology

## 2021-07-30 LAB — HEMOGLOBIN AND HEMATOCRIT, BLOOD
HCT: 25.7 % — ABNORMAL LOW (ref 36.0–46.0)
Hemoglobin: 8.4 g/dL — ABNORMAL LOW (ref 12.0–15.0)

## 2021-07-30 LAB — GLUCOSE, CAPILLARY
Glucose-Capillary: 98 mg/dL (ref 70–99)
Glucose-Capillary: 92 mg/dL (ref 70–99)
Glucose-Capillary: 92 mg/dL (ref 70–99)
Glucose-Capillary: 128 mg/dL — ABNORMAL HIGH (ref 70–99)
Glucose-Capillary: 129 mg/dL — ABNORMAL HIGH (ref 70–99)
Glucose-Capillary: 106 mg/dL — ABNORMAL HIGH (ref 70–99)
Glucose-Capillary: 97 mg/dL (ref 70–99)

## 2021-07-30 LAB — BASIC METABOLIC PANEL
Anion gap: 4 — ABNORMAL LOW (ref 5–15)
BUN: 24 mg/dL — ABNORMAL HIGH (ref 8–23)
CO2: 26 mmol/L (ref 22–32)
Calcium: 7.7 mg/dL — ABNORMAL LOW (ref 8.9–10.3)
Chloride: 113 mmol/L — ABNORMAL HIGH (ref 98–111)
Creatinine, Ser: 0.88 mg/dL (ref 0.44–1.00)
GFR, Estimated: >60 mL/min (ref >60)
Glucose, Bld: 108 mg/dL — ABNORMAL HIGH (ref 70–99)
Potassium: 3.8 mmol/L (ref 3.5–5.1)
Sodium: 143 mmol/L (ref 135–145)

## 2021-07-30 LAB — CBC
HCT: 21.9 % — ABNORMAL LOW (ref 36.0–46.0)
HCT: 25.7 % — ABNORMAL LOW (ref 36.0–46.0)
Hemoglobin: 7 g/dL — ABNORMAL LOW (ref 12.0–15.0)
Hemoglobin: 8.2 g/dL — ABNORMAL LOW (ref 12.0–15.0)
MCH: 28.9 pg (ref 26.0–34.0)
MCH: 29.2 pg (ref 26.0–34.0)
MCHC: 32 g/dL (ref 30.0–36.0)
MCHC: 31.9 g/dL (ref 30.0–36.0)
MCV: 90.5 fL (ref 80.0–100.0)
MCV: 91.5 fL (ref 80.0–100.0)
Platelets: 113 10*3/uL — ABNORMAL LOW (ref 150–400)
Platelets: 128 10*3/uL — ABNORMAL LOW (ref 150–400)
RBC: 2.42 MIL/uL — ABNORMAL LOW (ref 3.87–5.11)
RBC: 2.81 MIL/uL — ABNORMAL LOW (ref 3.87–5.11)
RDW: 15.9 % — ABNORMAL HIGH (ref 11.5–15.5)
RDW: 15.4 % (ref 11.5–15.5)
WBC: 7.6 10*3/uL (ref 4.0–10.5)
WBC: 8.4 10*3/uL (ref 4.0–10.5)
nRBC: 0 % (ref 0.0–0.2)
nRBC: 0 % (ref 0.0–0.2)

## 2021-07-30 LAB — SURGICAL PATHOLOGY

## 2021-07-30 LAB — PREPARE RBC (CROSSMATCH)

## 2021-07-30 MED ORDER — SODIUM CHLORIDE 0.9% IV SOLUTION: Freq: Once | INTRAVENOUS | Status: AC

## 2021-07-30 MED ORDER — INSULIN ASPART 100 UNIT/ML IJ SOLN: 0.0000 [IU] | Freq: Three times a day (TID) | INTRAMUSCULAR | Status: DC

## 2021-07-30 MED ORDER — INSULIN ASPART 100 UNIT/ML IJ SOLN: 0.0000 [IU] | Freq: Every day | INTRAMUSCULAR | Status: DC

## 2021-07-30 NOTE — Progress Notes (Signed)
Subjective: No reports of any melena.  No complaints.  Objective: Vital signs in last 24 hours: Temp:  [97 F (36.1 C)-98.9 F (37.2 C)] 98.3 F (36.8 C) (08/04 1234) Pulse Rate:  [76-100] 76 (08/04 1404) Resp:  [15-27] 17 (08/04 1404) BP: (103-157)/(38-81) 119/47 (08/04 1404) SpO2:  [95 %-100 %] 100 % (08/04 1404) Last BM Date: 07/29/21  Intake/Output from previous day: 08/03 0701 - 08/04 0700 In: 1491 [P.O.:120; I.V.:921; Blood:450] Out: 750 [Urine:750] Intake/Output this shift: Total I/O In: 1082.5 [P.O.:600; I.V.:40; Blood:442.5] Out: -   General appearance: alert and no distress GI: soft, non-tender; bowel sounds normal; no masses,  no organomegaly  Lab Results: Recent Labs    07/29/21 0236 07/29/21 1034 07/29/21 1923 07/30/21 0615 07/30/21 1414  WBC 10.6*  --  9.6 7.6  --   HGB 7.0*   < > 8.1* 7.0* 8.4*  HCT 22.2*   < > 25.3* 21.9* 25.7*  PLT 125*  --  117* 113*  --    < > = values in this interval not displayed.   BMET Recent Labs    07/28/21 0938 07/29/21 0236 07/30/21 0615  NA 140 143 143  K 4.1 4.1 3.8  CL 108 117* 113*  CO2 '26 23 26  '$ GLUCOSE 100* 128* 108*  BUN 44* 42* 24*  CREATININE 0.80 0.89 0.88  CALCIUM 7.9* 7.4* 7.7*   LFT Recent Labs    07/28/21 0938  PROT 5.1*  ALBUMIN 3.1*  AST 14*  ALT 8  ALKPHOS 41  BILITOT 0.2*   PT/INR Recent Labs    07/28/21 1845  LABPROT 14.8  INR 1.2   Hepatitis Panel No results for input(s): HEPBSAG, HCVAB, HEPAIGM, HEPBIGM in the last 72 hours. C-Diff No results for input(s): CDIFFTOX in the last 72 hours. Fecal Lactopherrin No results for input(s): FECLLACTOFRN in the last 72 hours.  Studies/Results: US Abdomen Limited RUQ (LIVER/GB)  Result Date: 07/28/2021 CLINICAL DATA:  Elevated liver function tests EXAM: ULTRASOUND ABDOMEN LIMITED RIGHT UPPER QUADRANT COMPARISON:  CT of earlier today FINDINGS: Gallbladder: No gallstones or wall thickening visualized. No sonographic Murphy sign noted  by sonographer. Common bile duct: Diameter: Normal, 4 mm. Liver: No focal lesion identified. Within normal limits in parenchymal echogenicity. Portal vein is patent on color Doppler imaging with normal direction of blood flow towards the liver. Other: None. IMPRESSION: No acute process or explanation for elevated liver function tests. Electronically Signed   By: Abigail Miyamoto M.D.   On: 07/28/2021 19:05    Medications: Scheduled:  sodium chloride   Intravenous Once   Chlorhexidine Gluconate Cloth  6 each Topical Daily   insulin aspart  0-15 Units Subcutaneous Q4H   mouth rinse  15 mL Mouth Rinse BID   pantoprazole (PROTONIX) IV  40 mg Intravenous Q12H   Continuous:  Assessment/Plan: 1) Gastric polyp - presumed to be the site of bleeding. 2) Anemia. 3) GERD.   Clinically she is stable and feeling well.  Her HGB did drop down to 7.0 g/dL, but she did not have any melena or hematochezia.  This is likely ongoing equilibration.  Plan: 1) Advance to a regular diet. 2) Continue with pantoprazole. 3) Follow HGB and transfuse as necessary.  LOS: 2 days   Sofi Bryars D 07/30/2021, 2:45 PM

## 2021-07-30 NOTE — Progress Notes (Signed)
[facility] Progress Note Patient Name: AILEY KNEPPER DOB: September 26, 1951 MRN: [id number]   Date of Service  07/30/2021  HPI/Events of Note  Patient now on PO diet - Nursing request to change to AC/HS insulin coverage.  eICU Interventions  Will change to AC/HS moderate Novolog SSI.     Intervention Category Major Interventions: Hyperglycemia - active titration of insulin therapy  Halley Kincer Eugene 07/30/2021, 9:30 PM

## 2021-07-30 NOTE — Progress Notes (Addendum)
NAME:  Gabrielle [name], MRN:  [id number], DOB:  November 04, 1951, LOS: 2 ADMISSION DATE:  07/28/2021, CONSULTATION DATE:  07/30/21 REFERRING MD:  [facility] , CHIEF COMPLAINT:  GIB   History of Present Illness:  Gabrielle [name] is a 70 y.o. F with past medical history of diverticulosis, GERD, colitis, anemia, anxiety, hypertension, stroke who presented to [facility] ED complaining of nausea and vomiting and black stools for the last 2 days along with lightheadedness.  Her stool was Hemoccult positive, initial hemoglobin 6.6, however she had a near syncopal event while in the ED with additional melanotic stool and repeat hemoglobin was 6.1.  Labwork showed normal renal function and CT abd/pelvis was without acute findings.  She was given IVF and did not require pressors.  She was transferred to [facility] ICU.   Is on no blood thinners or antiplatelet agents, she denies any NSAID use.  No recent abdominal pain or chest pain and states that she has had persistent GERD for many years and is on a PPI for this.  Last colonoscopy was 2 years ago and significant for colon polyps, she also underwent an EGD years ago that showed mucosal changes in the entire esophagus small mucosal ulcer, but no other abnormalities.  Patient states she has never had bleeding like this in the past.  Pertinent  Medical History   has a past medical history of Anemia, Anxiety, Arthritis, Cataracts, bilateral, Chronic insomnia (123456), Complication of anesthesia, Constipation, Diverticulosis, Dizziness, GERD (gastroesophageal reflux disease), Glaucoma, Hearing loss, History of bronchitis, History of colitis, History of colon polyps, Hypertension, Insomnia, Insomnia due to substance (10/11/2014), Joint pain, Joint swelling, PONV (postoperative nausea and vomiting), Stroke ([place]), and Tinnitus.   Significant Hospital Events: Including procedures, antibiotic start and stop dates in addition to other pertinent events   8/2  Admit to PCCM, transfused one unit PRBC's  Interim History / Subjective:  EGD yesterday. Polypectomy, 3x clips.  1 PRBC this AM, hgb was 7. She was and is HDS and is on RA  Pt this morning says she feels better.   She asked me to promise her she will not have another GIB and wanted assurance nothing like this would happen again when she went home... I politely said that I cannot promise that and do think it is quite possible she may see bloody Bms still given her hgb drop to 7, and it is possible she could have GIB in future after she is discharged home.   I provided assurance that she would only be discharged when her status is felt to be safe and stable, but we cannot make promises or provide assurance that she will never have a GIB in the future.    Objective   Blood pressure (!) 116/53, pulse 88, temperature 98.1 F (36.7 C), temperature source Oral, resp. rate 15, height '5\' 4"'$  (1.626 m), weight 99.8 kg, SpO2 100 %.        Intake/Output Summary (Last 24 hours) at 07/30/2021 0905 Last data filed at 07/30/2021 0430 Gross per 24 hour  Intake 1007.67 ml  Output 750 ml  Net 257.67 ml   Filed Weights   07/28/21 0924 07/29/21 1310  Weight: 103.4 kg 99.8 kg    General: wdwn obese older adult F reclined in bed on phone, drinking ginger ale, NAD  HEENT: NCAT pink mm anicteric sclera  Neuro: AAOx4 following commands  CV: rrr s1s2 cap refill brisk  PULM: CTAB. Even unlabored on RA  GI:  soft round ndnt  Extremities: no acute abnormalities no cyanosis or clubbing  Skin: c/d/w no rash    Resolved Hospital Problem list     Assessment & Plan:   ABLA GIB: 62m polyp with ulcer in gastric body  Hx GERD Hx Colonic Polyps  -has required ongoing PRBC for hgb 7 -EBG 8/3 -- hot snare polypectomy clips x3 P: -clear liquid diet, adv as per GI -Protonix -no NSAIDS -BID CBC, transfuse as needed, goal >7  -remains HDS -- will write orders to transfer out of the ICU 8/4   Elevated lactic  acid -in setting of GIB P -don't feel there is a great utility in trending further. Pt remains HDS   Anxiety Hx chronic pain Takes Xanax 2 mg daily P: -PRN IV ativan  -calm environment    Best Practice (right click and "Reselect all SmartList Selections" daily)   Diet/type: clear liquids DVT prophylaxis: SCD GI prophylaxis: PPI Lines: N/A Foley:  N/A Code Status:  full code Last date of multidisciplinary goals of care discussion [pt updated 8/4] Dispo: she remains HDS and appropriate for transfer out of ICU. Will ask TRH to take over care 8/5 ccm off   Labs   CBC: Recent Labs  Lab 07/28/21 0938 07/28/21 1124 07/28/21 1845 07/29/21 0236 07/29/21 1034 07/29/21 1923 07/30/21 0615  WBC 7.3  --  10.9* 10.6*  --  9.6 7.6  NEUTROABS 5.0  --   --   --   --   --   --   HGB 6.6*   < > 7.0* 7.0* 8.9* 8.1* 7.0*  HCT 20.5*   < > 22.2* 22.2* 28.4* 25.3* 21.9*  MCV 85.8  --  90.2 89.2  --  91.0 90.5  PLT 144*  --  137* 125*  --  117* 113*   < > = values in this interval not displayed.    Basic Metabolic Panel: Recent Labs  Lab 07/28/21 0938 07/29/21 0236 07/30/21 0615  NA 140 143 143  K 4.1 4.1 3.8  CL 108 117* 113*  CO2 '26 23 26  '$ GLUCOSE 100* 128* 108*  BUN 44* 42* 24*  CREATININE 0.80 0.89 0.88  CALCIUM 7.9* 7.4* 7.7*  MG  --  2.0  --   PHOS  --  3.1  --    GFR: Estimated Creatinine Clearance: 68.3 mL/min (by C-G formula based on SCr of 0.88 mg/dL). Recent Labs  Lab 07/28/21 1845 07/29/21 0236 07/29/21 1923 07/30/21 0615  WBC 10.9* 10.6* 9.6 7.6  LATICACIDVEN 2.5*  --   --   --     Liver Function Tests: Recent Labs  Lab 07/28/21 0938  AST 14*  ALT 8  ALKPHOS 41  BILITOT 0.2*  PROT 5.1*  ALBUMIN 3.1*   Recent Labs  Lab 07/28/21 0938  LIPASE 20   No results for input(s): AMMONIA in the last 168 hours.  ABG    Component Value Date/Time   TCO2 28 02/15/2010 0927     Coagulation Profile: Recent Labs  Lab 07/28/21 1845  INR 1.2     Cardiac Enzymes: No results for input(s): CKTOTAL, CKMB, CKMBINDEX, TROPONINI in the last 168 hours.  HbA1C: Hgb A1c MFr Bld  Date/Time Value Ref Range Status  07/28/2021 06:45 PM 5.2 4.8 - 5.6 % Final    Comment:    (NOTE) Pre diabetes:          5.7%-6.4%  Diabetes:              >  6.4%  Glycemic control for   <7.0% adults with diabetes   10/06/2018 02:57 PM 5.1 4.6 - 6.5 % Final    Comment:    Glycemic Control Guidelines for People with Diabetes:Non Diabetic:  <6%Goal of Therapy: <7%Additional Action Suggested:  >8%     CBG: Recent Labs  Lab 07/29/21 1529 07/29/21 1939 07/29/21 2334 07/30/21 0255 07/30/21 0740  GLUCAP 89 104* 99 98 92    CCT: n/a  Eliseo Gum MSN, AGACNP-BC [place] for pager  07/30/2021, 9:05 AM     Xxxxxxxxx  ATTESTATION & SIGNATURE   STAFF NOTE: I, Dr Ann Lions have personally reviewed patient's available data, including medical history, events of note, physical examination and test results as part of my evaluation. I have discussed with resident/NP and other care providers such as pharmacist, RN and RRT.  In addition,  I personally evaluated patient and elicited key findings of   S: Date of admit 07/28/2021 with LOS 2 for today 07/30/2021 : Gabrielle [name] is  -status post endoscopy and colonoscopy yesterday.  Status post gastric polypectomy.  No active bleeding but this morning hemoglobin was 7 g% and is getting blood.  She is worried about her prognosis and overall course.  She is waiting to talk to Dr. Collene Mares.  O:  Blood pressure 131/64, pulse 85, temperature 98.4 F (36.9 C), temperature source Oral, resp. rate 18, height '5\' 4"'$  (1.626 m), weight 99.8 kg, SpO2 100 %.   Obese.  No distress.  Color looks better abdomen is soft nontender no rebound no rigidity.  Alert and oriented x3.   LABS    PULMONARY No results for input(s): PHART, PCO2ART, PO2ART, HCO3, TCO2, O2SAT in the last 168  hours.  Invalid input(s): PCO2, PO2  CBC Recent Labs  Lab 07/29/21 0236 07/29/21 1034 07/29/21 1923 07/30/21 0615  HGB 7.0* 8.9* 8.1* 7.0*  HCT 22.2* 28.4* 25.3* 21.9*  WBC 10.6*  --  9.6 7.6  PLT 125*  --  117* 113*    COAGULATION Recent Labs  Lab 07/28/21 1845  INR 1.2    CARDIAC  No results for input(s): TROPONINI in the last 168 hours. No results for input(s): PROBNP in the last 168 hours.   CHEMISTRY Recent Labs  Lab 07/28/21 0938 07/29/21 0236 07/30/21 0615  NA 140 143 143  K 4.1 4.1 3.8  CL 108 117* 113*  CO2 '26 23 26  '$ GLUCOSE 100* 128* 108*  BUN 44* 42* 24*  CREATININE 0.80 0.89 0.88  CALCIUM 7.9* 7.4* 7.7*  MG  --  2.0  --   PHOS  --  3.1  --    Estimated Creatinine Clearance: 68.3 mL/min (by C-G formula based on SCr of 0.88 mg/dL).   LIVER Recent Labs  Lab 07/28/21 0938 07/28/21 1845  AST 14*  --   ALT 8  --   ALKPHOS 41  --   BILITOT 0.2*  --   PROT 5.1*  --   ALBUMIN 3.1*  --   INR  --  1.2     INFECTIOUS Recent Labs  Lab 07/28/21 1845  LATICACIDVEN 2.5*     ENDOCRINE CBG (last 3)  Recent Labs    07/29/21 2334 07/30/21 0255 07/30/21 0740  GLUCAP 99 98 92         IMAGING x24h  - image(s) personally visualized  -   highlighted in bold No results found.    A: Upper GI bleed secondary to gastric polyp.  Status post polypectomy.  Might still have some slow oozing but hemodynamically stable.  P: Moved to stepdown unit Continue monitoring hemoglobin Await GI - PRBC for hgb </= 6.9gm%    - exceptions are   -  if ACS susepcted/confirmed then transfuse for hgb </= 8.0gm%,  or    -  active bleeding with hemodynamic instability, then transfuse regardless of hemoglobin value   At at all times try to transfuse 1 unit prbc as possible with exception of active hemorrhage    Anti-infectives (From admission, onward)    None        Rest per NP/medical resident whose note is outlined above and that I agree  with   Dr. Brand Males, M.D., [facility].C.P Pulmonary and Critical Care Medicine Staff Physician [facility] Pager: [phone], If no answer or between  15:00h - 7:00h: call [phone]  07/30/2021 10:57 AM

## 2021-07-30 NOTE — Progress Notes (Signed)
[facility] Progress Note Patient Name: SUMAYAH CACHU DOB: 1951-04-21 MRN: [id number]   Date of Service  07/30/2021  HPI/Events of Note  Anemia - Hgb = 7.0.  eICU Interventions  Will transfuse 1 unit PRBC.     Intervention Category Major Interventions: Other:  Lysle Dingwall 07/30/2021, 6:55 AM

## 2021-07-31 LAB — CBC
HCT: 25.2 % — ABNORMAL LOW (ref 36.0–46.0)
HCT: 24.6 % — ABNORMAL LOW (ref 36.0–46.0)
Hemoglobin: 8.1 g/dL — ABNORMAL LOW (ref 12.0–15.0)
Hemoglobin: 7.7 g/dL — ABNORMAL LOW (ref 12.0–15.0)
MCH: 29.9 pg (ref 26.0–34.0)
MCH: 29.4 pg (ref 26.0–34.0)
MCHC: 32.1 g/dL (ref 30.0–36.0)
MCHC: 31.3 g/dL (ref 30.0–36.0)
MCV: 93 fL (ref 80.0–100.0)
MCV: 93.9 fL (ref 80.0–100.0)
Platelets: 118 10*3/uL — ABNORMAL LOW (ref 150–400)
Platelets: 133 10*3/uL — ABNORMAL LOW (ref 150–400)
RBC: 2.71 MIL/uL — ABNORMAL LOW (ref 3.87–5.11)
RBC: 2.62 MIL/uL — ABNORMAL LOW (ref 3.87–5.11)
RDW: 15.6 % — ABNORMAL HIGH (ref 11.5–15.5)
RDW: 15.7 % — ABNORMAL HIGH (ref 11.5–15.5)
WBC: 6.4 10*3/uL (ref 4.0–10.5)
WBC: 5.4 10*3/uL (ref 4.0–10.5)
nRBC: 0.3 % — ABNORMAL HIGH (ref 0.0–0.2)
nRBC: 0 % (ref 0.0–0.2)

## 2021-07-31 LAB — TYPE AND SCREEN
ABO/RH(D): O POS
Antibody Screen: NEGATIVE
Unit division: 0
Unit division: 0
Unit division: 0

## 2021-07-31 LAB — BPAM RBC
Blood Product Expiration Date: 202209062359
Blood Product Expiration Date: 202209062359
Blood Product Expiration Date: 202209092359
ISSUE DATE / TIME: 202208022140
ISSUE DATE / TIME: 202208030426
ISSUE DATE / TIME: 202208040919
Unit Type and Rh: 5100
Unit Type and Rh: 5100
Unit Type and Rh: 5100

## 2021-07-31 LAB — GLUCOSE, CAPILLARY
Glucose-Capillary: 103 mg/dL — ABNORMAL HIGH (ref 70–99)
Glucose-Capillary: 90 mg/dL (ref 70–99)
Glucose-Capillary: 107 mg/dL — ABNORMAL HIGH (ref 70–99)
Glucose-Capillary: 88 mg/dL (ref 70–99)

## 2021-07-31 MED ORDER — ALPRAZOLAM 1 MG PO TABS
1.0000 mg | ORAL_TABLET | Freq: Three times a day (TID) | ORAL | Status: DC | PRN
  Administered 2021-07-31 – 2021-08-01 (×4): 1 mg via ORAL
  Filled 2021-07-31 (×4): qty 1

## 2021-07-31 MED ORDER — ACETAMINOPHEN 325 MG PO TABS
650.0000 mg | ORAL_TABLET | ORAL | Status: DC | PRN
  Administered 2021-07-31 – 2021-08-01 (×2): 650 mg via ORAL
  Filled 2021-07-31 (×2): qty 2

## 2021-07-31 MED ORDER — ZOLPIDEM TARTRATE 10 MG PO TABS
10.0000 mg | ORAL_TABLET | Freq: Once | ORAL | Status: AC
  Administered 2021-07-31: 10 mg via ORAL
  Filled 2021-07-31: qty 1

## 2021-07-31 NOTE — Plan of Care (Signed)
Pt aox4, no c/o pain, no episodes of bloody stool and tolerated PO intake.  Problem: Health Behavior/Discharge Planning: Goal: Ability to manage health-related needs will improve Outcome: Progressing   Problem: Clinical Measurements: Goal: Diagnostic test results will improve Outcome: Progressing   Problem: Activity: Goal: Risk for activity intolerance will decrease Outcome: Progressing   Problem: Nutrition: Goal: Adequate nutrition will be maintained Outcome: Progressing   Problem: Coping: Goal: Level of anxiety will decrease Outcome: Progressing   Problem: Bowel/Gastric: Goal: Will show no signs and symptoms of gastrointestinal bleeding Outcome: Progressing   Problem: Fluid Volume: Goal: Will show no signs and symptoms of excessive bleeding Outcome: Progressing

## 2021-07-31 NOTE — Care Management Important Message (Signed)
Important Message  Patient Details IM Letter given to the Patient. Name: Gabrielle [name] MRN: [id number] Date of Birth: January 13, 1951   Medicare Important Message Given:  Yes     Kerin Salen 07/31/2021, 10:47 AM

## 2021-07-31 NOTE — Assessment & Plan Note (Addendum)
-   Hgb 6.6 g/dL on admission - s/p 3 units PRBC total for admission so far - Hgb stable with further monitoring, 7.8 g/dL at discharge

## 2021-07-31 NOTE — Progress Notes (Signed)
Subjective: Some minor lower abdominal discomfort.  No melena or hematochezia.  Objective: Vital signs in last 24 hours: Temp:  [98 F (36.7 C)-98.6 F (37 C)] 98.5 F (36.9 C) (08/05 0359) Pulse Rate:  [76-92] 85 (08/05 0359) Resp:  [15-22] 18 (08/05 0359) BP: (96-137)/(37-81) 131/64 (08/05 0359) SpO2:  [97 %-100 %] 100 % (08/05 0359) Last BM Date: 07/29/21  Intake/Output from previous day: 08/04 0701 - 08/05 0700 In: 1442.5 [P.O.:960; I.V.:40; Blood:442.5] Out: 600 [Urine:600] Intake/Output this shift: No intake/output data recorded.  General appearance: alert and no distress GI: soft, non-tender; bowel sounds normal; no masses,  no organomegaly  Lab Results: Recent Labs    07/30/21 0615 07/30/21 1414 07/30/21 1659 07/31/21 0343  WBC 7.6  --  8.4 6.4  HGB 7.0* 8.4* 8.2* 8.1*  HCT 21.9* 25.7* 25.7* 25.2*  PLT 113*  --  128* 118*   BMET Recent Labs    07/28/21 0938 07/29/21 0236 07/30/21 0615  NA 140 143 143  K 4.1 4.1 3.8  CL 108 117* 113*  CO2 '26 23 26  '$ GLUCOSE 100* 128* 108*  BUN 44* 42* 24*  CREATININE 0.80 0.89 0.88  CALCIUM 7.9* 7.4* 7.7*   LFT Recent Labs    07/28/21 0938  PROT 5.1*  ALBUMIN 3.1*  AST 14*  ALT 8  ALKPHOS 41  BILITOT 0.2*   PT/INR Recent Labs    07/28/21 1845  LABPROT 14.8  INR 1.2   Hepatitis Panel No results for input(s): HEPBSAG, HCVAB, HEPAIGM, HEPBIGM in the last 72 hours. C-Diff No results for input(s): CDIFFTOX in the last 72 hours. Fecal Lactopherrin No results for input(s): FECLLACTOFRN in the last 72 hours.  Studies/Results: No results found.  Medications: Scheduled:  sodium chloride   Intravenous Once   Chlorhexidine Gluconate Cloth  6 each Topical Daily   insulin aspart  0-15 Units Subcutaneous TID WC   insulin aspart  0-5 Units Subcutaneous QHS   mouth rinse  15 mL Mouth Rinse BID   pantoprazole (PROTONIX) IV  40 mg Intravenous Q12H   Continuous:  Assessment/Plan: 1) Presumed bleeding  gastric hyperplastic polyp. 2) Anemia - Improved with transfusion.   The patient remains clinically stable.  No reports of any hematochezia or melena.  Her HGB increased after the 1 unit of PRBC.  If her HGB remains stable by tomorrow she can be discharged home.  The pathology was positive for a hyperplastic polyp and the larger hyperplastic polyps are known to bleed.  Plan: 1) Follow HGB. 2) If stable in the AM Okay to [name]/C home. 3) Follow up with Dr. Collene Mares in 1-2 weeks upon discharge. 4) [facility] to round this weekend.  LOS: 3 days    Gabrielle [name] 07/31/2021, 7:13 AM

## 2021-07-31 NOTE — Assessment & Plan Note (Signed)
-   Home Xanax resumed

## 2021-07-31 NOTE — Assessment & Plan Note (Signed)
-   On baclofen at home

## 2021-07-31 NOTE — Assessment & Plan Note (Signed)
-   Patient uses white noise machine bedside

## 2021-07-31 NOTE — Assessment & Plan Note (Addendum)
-   Underwent EGD on 07/29/2021 and was found to have an ulcerated gastric polyp which was excised and hemoclips placed.  This was considered the culprit of her GI bleed and acute blood loss anemia -Resumed back on Nexium at discharge - Outpatient follow-up with GI - Hemoglobin essentially stable at time of discharge.  Repeat CBC at follow-up

## 2021-07-31 NOTE — Addendum Note (Signed)
Addendum  created 07/31/21 1705 by Janeece Riggers, MD   Attestation recorded in [place], [place] accepted, [place] Attestations filed

## 2021-07-31 NOTE — Hospital Course (Addendum)
Gabrielle [name] is a 70 yo female with PMH colonic polyps, diverticulosis, GERD, HTN, tinnitus, CVA, arthritis who presented to the hospital on 07/28/21 with melena and near syncope.  She was also having reports of nausea and vomiting.  She had Hemoccult positive stools and initial hemoglobin 6.6 g/dL. She was admitted for blood transfusions and further work-up with GI consultation.  She underwent EGD on 07/29/2021.  Ulcerated gastric polyp was removed.

## 2021-07-31 NOTE — Progress Notes (Signed)
Progress Note    Gabrielle [name]   [id number]  DOB: 1951/02/05  DOA: 07/28/2021     3  PCP: [name], MD  CC: dark stools   Hospital Course: Gabrielle [name] is a 70 yo female with PMH colonic polyps, diverticulosis, GERD, HTN, tinnitus, CVA, arthritis who presented to the hospital on 07/28/21 with melena and near syncope.  She was also having reports of nausea and vomiting.  She had Hemoccult positive stools and initial hemoglobin 6.6 g/dL. She was admitted for blood transfusions and further work-up with GI consultation.  She underwent EGD on 07/29/2021.  Ulcerated gastric polyp was removed.  Interval History:  No events overnight.  No further dark stools per patient report.  She has been tolerating a diet well and hemoglobin has remained stable since EGD.  Tentative plan is for discharge tomorrow if hemoglobin continues to remain stable. Her home Xanax was also resumed due to ongoing anxiety and patient request for resuming.  ROS: Constitutional: negative for chills and fevers, Respiratory: negative for cough and sputum, Cardiovascular: negative, and Gastrointestinal: negative for abdominal pain  Assessment & Plan: Gastrointestinal hemorrhage with melena - Underwent EGD on 07/29/2021 and was found to have an ulcerated gastric polyp which was excised and hemoclips placed.  This was considered the culprit of her GI bleed and acute blood loss anemia - continue protonix  - Hemoglobin has remained stable after blood transfusions required on admission - If hemoglobin continues to remain stable tomorrow, will be stable for discharging home  Acute blood loss anemia - Hgb 6.6 g/dL on admission - s/p 3 units PRBC total for admission so far - Hgb stable this am, 8.1 g/dL - repeat CBC again in am; if stable, will d/c home   Anxiety and depression - Home Xanax resumed  Pulsatile tinnitus of both ears - Patient uses white noise machine bedside  Chronic left SI joint pain - On  baclofen at home   Old records reviewed in assessment of this patient  Antimicrobials:   DVT prophylaxis: Place and maintain sequential compression device Start: 07/28/21 1541   Code Status:   Code Status: Full Code Family Communication:   Disposition Plan: Status is: Inpatient  Remains inpatient appropriate because:IV treatments appropriate due to intensity of illness or inability to take PO and Inpatient level of care appropriate due to severity of illness  Dispo: The patient is from: Home              Anticipated d/c is to: Home              Patient currently is not medically stable to d/c.   Difficult to place patient No  Risk of unplanned readmission score: Unplanned Admission- Pilot do not use: 17.28   Objective: Blood pressure (!) 109/51, pulse 71, temperature 98.3 F (36.8 C), temperature source Oral, resp. rate 16, height '5\' 4"'$  (1.626 m), weight 99.8 kg, SpO2 100 %.  Examination: General appearance: alert, cooperative, and no distress Head: Normocephalic, without obvious abnormality, atraumatic Eyes:  EOMI Lungs: clear to auscultation bilaterally Heart: regular rate and rhythm and S1, S2 normal Abdomen: normal findings: bowel sounds normal and soft, non-tender Extremities:  No edema Skin: mobility and turgor normal Neurologic: Grossly normal  Consultants:  GI  Procedures:    Data Reviewed: I have personally reviewed following labs and imaging studies Results for orders placed or performed during the hospital encounter of 07/28/21 (from the past 24 hour(s))  Hemoglobin and hematocrit, blood  Status: Abnormal   Collection Time: 07/30/21  2:14 PM  Result Value Ref Range   Hemoglobin 8.4 (L) 12.0 - 15.0 g/dL   HCT 25.7 (L) 36.0 - 46.0 %  Glucose, capillary     Status: Abnormal   Collection Time: 07/30/21  3:42 PM  Result Value Ref Range   Glucose-Capillary 128 (H) 70 - 99 mg/dL  CBC     Status: Abnormal   Collection Time: 07/30/21  4:59 PM  Result  Value Ref Range   WBC 8.4 4.0 - 10.5 K/uL   RBC 2.81 (L) 3.87 - 5.11 MIL/uL   Hemoglobin 8.2 (L) 12.0 - 15.0 g/dL   HCT 25.7 (L) 36.0 - 46.0 %   MCV 91.5 80.0 - 100.0 fL   MCH 29.2 26.0 - 34.0 pg   MCHC 31.9 30.0 - 36.0 g/dL   RDW 15.4 11.5 - 15.5 %   Platelets 128 (L) 150 - 400 K/uL   nRBC 0.0 0.0 - 0.2 %  Glucose, capillary     Status: Abnormal   Collection Time: 07/30/21  7:38 PM  Result Value Ref Range   Glucose-Capillary 129 (H) 70 - 99 mg/dL  Glucose, capillary     Status: Abnormal   Collection Time: 07/30/21  8:33 PM  Result Value Ref Range   Glucose-Capillary 106 (H) 70 - 99 mg/dL  Glucose, capillary     Status: None   Collection Time: 07/30/21 11:24 PM  Result Value Ref Range   Glucose-Capillary 97 70 - 99 mg/dL   Comment 1 Notify RN    Comment 2 Document in Chart   CBC     Status: Abnormal   Collection Time: 07/31/21  3:43 AM  Result Value Ref Range   WBC 6.4 4.0 - 10.5 K/uL   RBC 2.71 (L) 3.87 - 5.11 MIL/uL   Hemoglobin 8.1 (L) 12.0 - 15.0 g/dL   HCT 25.2 (L) 36.0 - 46.0 %   MCV 93.0 80.0 - 100.0 fL   MCH 29.9 26.0 - 34.0 pg   MCHC 32.1 30.0 - 36.0 g/dL   RDW 15.6 (H) 11.5 - 15.5 %   Platelets 118 (L) 150 - 400 K/uL   nRBC 0.3 (H) 0.0 - 0.2 %  Glucose, capillary     Status: Abnormal   Collection Time: 07/31/21  7:37 AM  Result Value Ref Range   Glucose-Capillary 103 (H) 70 - 99 mg/dL  Glucose, capillary     Status: None   Collection Time: 07/31/21 12:24 PM  Result Value Ref Range   Glucose-Capillary 90 70 - 99 mg/dL    Recent Results (from the past 240 hour(s))  Resp Panel by RT-PCR (Flu A&B, Covid) Nasopharyngeal Swab     Status: None   Collection Time: 07/28/21  9:39 AM   Specimen: Nasopharyngeal Swab; Nasopharyngeal(NP) swabs in vial transport medium  Result Value Ref Range Status   SARS Coronavirus 2 by RT PCR NEGATIVE NEGATIVE Final    Comment: (NOTE) SARS-CoV-2 target nucleic acids are NOT DETECTED.  The SARS-CoV-2 RNA is generally detectable  in upper respiratory specimens during the acute phase of infection. The lowest concentration of SARS-CoV-2 viral copies this assay can detect is 138 copies/mL. A negative result does not preclude SARS-Cov-2 infection and should not be used as the sole basis for treatment or other patient management decisions. A negative result may occur with  improper specimen collection/handling, submission of specimen other than nasopharyngeal swab, presence of viral mutation(s) within the areas targeted by this  assay, and inadequate number of viral copies(<138 copies/mL). A negative result must be combined with clinical observations, patient history, and epidemiological information. The expected result is Negative.  Fact Sheet for Patients:  EntrepreneurPulse.com.au  Fact Sheet for Healthcare Providers:  IncredibleEmployment.be  This test is no t yet approved or cleared by the [place] FDA and  has been authorized for detection and/or diagnosis of SARS-CoV-2 by FDA under an Emergency Use Authorization (EUA). This EUA will remain  in effect (meaning this test can be used) for the duration of the COVID-19 declaration under Section 564(b)(1) of the Act, 21 U.S.C.section 360bbb-3(b)(1), unless the authorization is terminated  or revoked sooner.       Influenza A by PCR NEGATIVE NEGATIVE Final   Influenza B by PCR NEGATIVE NEGATIVE Final    Comment: (NOTE) The Xpert Xpress SARS-CoV-2/FLU/RSV plus assay is intended as an aid in the diagnosis of influenza from Nasopharyngeal swab specimens and should not be used as a sole basis for treatment. Nasal washings and aspirates are unacceptable for Xpert Xpress SARS-CoV-2/FLU/RSV testing.  Fact Sheet for Patients: EntrepreneurPulse.com.au  Fact Sheet for Healthcare Providers: IncredibleEmployment.be  This test is not yet approved or cleared by the [place] FDA and has  been authorized for detection and/or diagnosis of SARS-CoV-2 by FDA under an Emergency Use Authorization (EUA). This EUA will remain in effect (meaning this test can be used) for the duration of the COVID-19 declaration under Section 564(b)(1) of the Act, 21 U.S.C. section 360bbb-3(b)(1), unless the authorization is terminated or revoked.  Performed at [facility], [address], [place], [place] [address]   MRSA Next Gen by PCR, Nasal     Status: None   Collection Time: 07/28/21  4:05 PM   Specimen: Nasal Mucosa; Nasal Swab  Result Value Ref Range Status   MRSA by PCR Next Gen NOT DETECTED NOT DETECTED Final    Comment: (NOTE) The GeneXpert MRSA Assay (FDA approved for NASAL specimens only), is one component of a comprehensive MRSA colonization surveillance program. It is not intended to diagnose MRSA infection nor to guide or monitor treatment for MRSA infections. Test performance is not FDA approved in patients less than 57 years old. Performed at [facility], [place] [address], [place], [place] [address]      Radiology Studies: No results found. US Abdomen Limited RUQ (LIVER/GB)  Final Result    CT Abdomen Pelvis Wo Contrast  Final Result      Scheduled Meds:  sodium chloride   Intravenous Once   Chlorhexidine Gluconate Cloth  6 each Topical Daily   insulin aspart  0-15 Units Subcutaneous TID WC   insulin aspart  0-5 Units Subcutaneous QHS   mouth rinse  15 mL Mouth Rinse BID   pantoprazole (PROTONIX) IV  40 mg Intravenous Q12H   PRN Meds: ALPRAZolam, ondansetron (ZOFRAN) IV Continuous Infusions:   LOS: 3 days  Time spent: Greater than 50% of the 35 minute visit was spent in counseling/coordination of care for the patient as laid out in the A&P.   Dwyane Dee, MD [facility] 07/31/2021, 1:47 PM

## 2021-08-01 LAB — CBC
HCT: 24.9 % — ABNORMAL LOW (ref 36.0–46.0)
Hemoglobin: 7.8 g/dL — ABNORMAL LOW (ref 12.0–15.0)
MCH: 28.9 pg (ref 26.0–34.0)
MCHC: 31.3 g/dL (ref 30.0–36.0)
MCV: 92.2 fL (ref 80.0–100.0)
Platelets: 121 10*3/uL — ABNORMAL LOW (ref 150–400)
RBC: 2.7 MIL/uL — ABNORMAL LOW (ref 3.87–5.11)
RDW: 15.5 % (ref 11.5–15.5)
WBC: 5 10*3/uL (ref 4.0–10.5)
nRBC: 0 % (ref 0.0–0.2)

## 2021-08-01 LAB — GLUCOSE, CAPILLARY: Glucose-Capillary: 84 mg/dL (ref 70–99)

## 2021-08-01 NOTE — Discharge Summary (Signed)
Physician Discharge Summary   Gabrielle [name] [id number] DOB: July 30, 1951 DOA: 07/28/2021  PCP: [name], MD  Admit date: 07/28/2021 Discharge date: 08/01/2021   Admitted From: home Disposition:  home Discharging physician: Dwyane Dee, MD  Recommendations for Outpatient Follow-up:  Follow up with GI Repeat CBC  Home Health:  Equipment/Devices:   Patient discharged to home in Discharge Condition: stable Risk of unplanned readmission score: Unplanned Admission- Pilot do not use: 16.19  CODE STATUS: Full Diet recommendation:  Diet Orders (From admission, onward)     Start     Ordered   08/01/21 0000  Diet general        08/01/21 1134   07/30/21 1427  Diet regular Room service appropriate? Yes; Fluid consistency: Thin  Diet effective now       Question Answer Comment  Room service appropriate? Yes   Fluid consistency: Thin      07/30/21 1426            Hospital Course: Ms. Hemmer is a 70 yo female with PMH colonic polyps, diverticulosis, GERD, HTN, tinnitus, CVA, arthritis who presented to the hospital on 07/28/21 with melena and near syncope.  She was also having reports of nausea and vomiting.  She had Hemoccult positive stools and initial hemoglobin 6.6 g/dL. She was admitted for blood transfusions and further work-up with GI consultation.  She underwent EGD on 07/29/2021.  Ulcerated gastric polyp was removed.  * Gastrointestinal hemorrhage with melena - Underwent EGD on 07/29/2021 and was found to have an ulcerated gastric polyp which was excised and hemoclips placed.  This was considered the culprit of her GI bleed and acute blood loss anemia -Resumed back on Nexium at discharge - Outpatient follow-up with GI - Hemoglobin essentially stable at time of discharge.  Repeat CBC at follow-up  Acute blood loss anemia - Hgb 6.6 g/dL on admission - s/p 3 units PRBC total for admission so far - Hgb stable with further monitoring, 7.8 g/dL at discharge  Anxiety  and depression - Home Xanax resumed  Pulsatile tinnitus of both ears - Patient uses white noise machine bedside  Chronic left SI joint pain - On baclofen at home   The patient's chronic medical conditions were treated accordingly per the patient's home medication regimen except as noted.  On day of discharge, patient was felt deemed stable for discharge. Patient/family member advised to call PCP or come back to ER if needed.   Principal Diagnosis: Gastrointestinal hemorrhage with melena  Discharge Diagnoses: Active Hospital Problems   Diagnosis Date Noted   Gastrointestinal hemorrhage with melena     Priority: High   Acute blood loss anemia     Priority: High   Anxiety and depression 08/14/2020   Essential (primary) hypertension 08/14/2020   Pulsatile tinnitus of both ears 03/23/2018   Chronic left SI joint pain 06/03/2015    Resolved Hospital Problems  No resolved problems to display.    Discharge Instructions     Diet general   Complete by: As directed    Increase activity slowly   Complete by: As directed       Allergies as of 08/01/2021       Reactions   Iodinated Diagnostic Agents Hives   Other Hives, Other (See Comments), Rash   Shellfish Allergy Hives, Other (See Comments), Rash   Gadolinium Derivatives Hives   Hydrocodone Other (See Comments)   insomnia   Metronidazole Nausea And Vomiting, Other (See Comments)   Acetazolamide Anxiety  Makes head feel strange   Gabapentin Anxiety   unknown   Mirtazapine Other (See Comments)   Dry eyes        Medication List     TAKE these medications    ALPRAZolam 1 MG tablet Commonly known as: XANAX TAKE 1 TABLET BY MOUTH 3 TIMES DAILY.   baclofen 10 MG tablet Commonly known as: LIORESAL Take 10 mg by mouth in the morning and at bedtime.   carboxymethylcellulose 0.5 % Soln Commonly known as: REFRESH PLUS 1 drop 3 (three) times daily as needed. Uses kind with no preservatives Only can see in R eye    esomeprazole 40 MG capsule Commonly known as: NEXIUM TAKE 1 CAPSULE (40 MG TOTAL) BY MOUTH 2 (TWO) TIMES DAILY BEFORE A MEAL.   polyethylene glycol 17 g packet Commonly known as: MIRALAX / GLYCOLAX Take 17 g by mouth daily as needed.   sucralfate 1 GM/10ML suspension Commonly known as: Carafate Take 10 mLs (1 g total) by mouth 4 (four) times daily -  with meals and at bedtime.   zolpidem 10 MG tablet Commonly known as: AMBIEN TAKE 1 TO 1 & 1/2 TABLET AT BEDTIME AS NEEDED FOR SLEEP        Allergies  Allergen Reactions   Iodinated Diagnostic Agents Hives   Other Hives, Other (See Comments) and Rash   Shellfish Allergy Hives, Other (See Comments) and Rash   Gadolinium Derivatives Hives   Hydrocodone Other (See Comments)    insomnia   Metronidazole Nausea And Vomiting and Other (See Comments)   Acetazolamide Anxiety    Makes head feel strange   Gabapentin Anxiety    unknown   Mirtazapine Other (See Comments)    Dry eyes    Consultations: GI  Discharge Exam: BP 129/65 (BP Location: Left Arm)   Pulse 70   Temp 98.9 F (37.2 C) (Oral)   Resp 18   Ht '5\' 4"'$  (1.626 m)   Wt 99.8 kg   SpO2 98%   BMI 37.76 kg/m  General appearance: alert, cooperative, and no distress Head: Normocephalic, without obvious abnormality, atraumatic Eyes:  EOMI Lungs: clear to auscultation bilaterally Heart: regular rate and rhythm and S1, S2 normal Abdomen: normal findings: bowel sounds normal and soft, non-tender Extremities:  No edema Skin: mobility and turgor normal Neurologic: Grossly normal  The results of significant diagnostics from this hospitalization (including imaging, microbiology, ancillary and laboratory) are listed below for reference.   Microbiology: Recent Results (from the past 240 hour(s))  Resp Panel by RT-PCR (Flu A&B, Covid) Nasopharyngeal Swab     Status: None   Collection Time: 07/28/21  9:39 AM   Specimen: Nasopharyngeal Swab; Nasopharyngeal(NP) swabs in vial  transport medium  Result Value Ref Range Status   SARS Coronavirus 2 by RT PCR NEGATIVE NEGATIVE Final    Comment: (NOTE) SARS-CoV-2 target nucleic acids are NOT DETECTED.  The SARS-CoV-2 RNA is generally detectable in upper respiratory specimens during the acute phase of infection. The lowest concentration of SARS-CoV-2 viral copies this assay can detect is 138 copies/mL. A negative result does not preclude SARS-Cov-2 infection and should not be used as the sole basis for treatment or other patient management decisions. A negative result may occur with  improper specimen collection/handling, submission of specimen other than nasopharyngeal swab, presence of viral mutation(s) within the areas targeted by this assay, and inadequate number of viral copies(<138 copies/mL). A negative result must be combined with clinical observations, patient history, and epidemiological information. The  expected result is Negative.  Fact Sheet for Patients:  EntrepreneurPulse.com.au  Fact Sheet for Healthcare Providers:  IncredibleEmployment.be  This test is no t yet approved or cleared by the [place] FDA and  has been authorized for detection and/or diagnosis of SARS-CoV-2 by FDA under an Emergency Use Authorization (EUA). This EUA will remain  in effect (meaning this test can be used) for the duration of the COVID-19 declaration under Section 564(b)(1) of the Act, 21 U.S.C.section 360bbb-3(b)(1), unless the authorization is terminated  or revoked sooner.       Influenza A by PCR NEGATIVE NEGATIVE Final   Influenza B by PCR NEGATIVE NEGATIVE Final    Comment: (NOTE) The Xpert Xpress SARS-CoV-2/FLU/RSV plus assay is intended as an aid in the diagnosis of influenza from Nasopharyngeal swab specimens and should not be used as a sole basis for treatment. Nasal washings and aspirates are unacceptable for Xpert Xpress SARS-CoV-2/FLU/RSV testing.  Fact  Sheet for Patients: EntrepreneurPulse.com.au  Fact Sheet for Healthcare Providers: IncredibleEmployment.be  This test is not yet approved or cleared by the [place] FDA and has been authorized for detection and/or diagnosis of SARS-CoV-2 by FDA under an Emergency Use Authorization (EUA). This EUA will remain in effect (meaning this test can be used) for the duration of the COVID-19 declaration under Section 564(b)(1) of the Act, 21 U.S.C. section 360bbb-3(b)(1), unless the authorization is terminated or revoked.  Performed at [facility], [address] [place], [place] [address]   MRSA Next Gen by PCR, Nasal     Status: None   Collection Time: 07/28/21  4:05 PM   Specimen: Nasal Mucosa; Nasal Swab  Result Value Ref Range Status   MRSA by PCR Next Gen NOT DETECTED NOT DETECTED Final    Comment: (NOTE) The GeneXpert MRSA Assay (FDA approved for NASAL specimens only), is one component of a comprehensive MRSA colonization surveillance program. It is not intended to diagnose MRSA infection nor to guide or monitor treatment for MRSA infections. Test performance is not FDA approved in patients less than 71 years old. Performed at [facility], [place] [address], [place], [place] [address]      Labs: BNP (last 3 results) No results for input(s): BNP in the last 8760 hours. Basic Metabolic Panel: Recent Labs  Lab 07/28/21 0938 07/29/21 0236 07/30/21 0615  NA 140 143 143  K 4.1 4.1 3.8  CL 108 117* 113*  CO2 '26 23 26  '$ GLUCOSE 100* 128* 108*  BUN 44* 42* 24*  CREATININE 0.80 0.89 0.88  CALCIUM 7.9* 7.4* 7.7*  MG  --  2.0  --   PHOS  --  3.1  --    Liver Function Tests: Recent Labs  Lab 07/28/21 0938  AST 14*  ALT 8  ALKPHOS 41  BILITOT 0.2*  PROT 5.1*  ALBUMIN 3.1*   Recent Labs  Lab 07/28/21 0938  LIPASE 20   No results for input(s): AMMONIA in the last 168 hours. CBC: Recent  Labs  Lab 07/28/21 0938 07/28/21 1124 07/30/21 0615 07/30/21 1414 07/30/21 1659 07/31/21 0343 07/31/21 1703 08/01/21 0414  WBC 7.3   < > 7.6  --  8.4 6.4 5.4 5.0  NEUTROABS 5.0  --   --   --   --   --   --   --   HGB 6.6*   < > 7.0* 8.4* 8.2* 8.1* 7.7* 7.8*  HCT 20.5*   < > 21.9* 25.7* 25.7* 25.2* 24.6* 24.9*  MCV 85.8   < > 90.5  --  91.5 93.0 93.9 92.2  PLT 144*   < > 113*  --  128* 118* 133* 121*   < > = values in this interval not displayed.   Cardiac Enzymes: No results for input(s): CKTOTAL, CKMB, CKMBINDEX, TROPONINI in the last 168 hours. BNP: Invalid input(s): POCBNP CBG: Recent Labs  Lab 07/31/21 0737 07/31/21 1224 07/31/21 1620 07/31/21 2122 08/01/21 0839  GLUCAP 103* 90 107* 88 84   D-Dimer No results for input(s): DDIMER in the last 72 hours. Hgb A1c No results for input(s): HGBA1C in the last 72 hours. Lipid Profile No results for input(s): CHOL, HDL, LDLCALC, TRIG, CHOLHDL, LDLDIRECT in the last 72 hours. Thyroid function studies No results for input(s): TSH, T4TOTAL, T3FREE, THYROIDAB in the last 72 hours.  Invalid input(s): FREET3 Anemia work up No results for input(s): VITAMINB12, FOLATE, FERRITIN, TIBC, IRON, RETICCTPCT in the last 72 hours. Urinalysis    Component Value Date/Time   COLORURINE YELLOW 07/29/2021 0826   APPEARANCEUR HAZY (A) 07/29/2021 0826   LABSPEC 1.020 07/29/2021 0826   PHURINE 5.0 07/29/2021 0826   GLUCOSEU NEGATIVE 07/29/2021 0826   GLUCOSEU NEGATIVE 04/08/2020 1017   HGBUR LARGE (A) 07/29/2021 0826   BILIRUBINUR NEGATIVE 07/29/2021 0826   BILIRUBINUR Negative 12/15/2018 1512   KETONESUR NEGATIVE 07/29/2021 0826   PROTEINUR NEGATIVE 07/29/2021 0826   UROBILINOGEN 0.2 04/08/2020 1017   NITRITE NEGATIVE 07/29/2021 0826   LEUKOCYTESUR LARGE (A) 07/29/2021 0826   Sepsis Labs Invalid input(s): PROCALCITONIN,  WBC,  LACTICIDVEN Microbiology Recent Results (from the past 240 hour(s))  Resp Panel by RT-PCR (Flu A&B,  Covid) Nasopharyngeal Swab     Status: None   Collection Time: 07/28/21  9:39 AM   Specimen: Nasopharyngeal Swab; Nasopharyngeal(NP) swabs in vial transport medium  Result Value Ref Range Status   SARS Coronavirus 2 by RT PCR NEGATIVE NEGATIVE Final    Comment: (NOTE) SARS-CoV-2 target nucleic acids are NOT DETECTED.  The SARS-CoV-2 RNA is generally detectable in upper respiratory specimens during the acute phase of infection. The lowest concentration of SARS-CoV-2 viral copies this assay can detect is 138 copies/mL. A negative result does not preclude SARS-Cov-2 infection and should not be used as the sole basis for treatment or other patient management decisions. A negative result may occur with  improper specimen collection/handling, submission of specimen other than nasopharyngeal swab, presence of viral mutation(s) within the areas targeted by this assay, and inadequate number of viral copies(<138 copies/mL). A negative result must be combined with clinical observations, patient history, and epidemiological information. The expected result is Negative.  Fact Sheet for Patients:  EntrepreneurPulse.com.au  Fact Sheet for Healthcare Providers:  IncredibleEmployment.be  This test is no t yet approved or cleared by the [place] FDA and  has been authorized for detection and/or diagnosis of SARS-CoV-2 by FDA under an Emergency Use Authorization (EUA). This EUA will remain  in effect (meaning this test can be used) for the duration of the COVID-19 declaration under Section 564(b)(1) of the Act, 21 U.S.C.section 360bbb-3(b)(1), unless the authorization is terminated  or revoked sooner.       Influenza A by PCR NEGATIVE NEGATIVE Final   Influenza B by PCR NEGATIVE NEGATIVE Final    Comment: (NOTE) The Xpert Xpress SARS-CoV-2/FLU/RSV plus assay is intended as an aid in the diagnosis of influenza from Nasopharyngeal swab specimens and should  not be used as a sole basis for treatment. Nasal washings and aspirates  are unacceptable for Xpert Xpress SARS-CoV-2/FLU/RSV testing.  Fact Sheet for Patients: EntrepreneurPulse.com.au  Fact Sheet for Healthcare Providers: IncredibleEmployment.be  This test is not yet approved or cleared by the [place] FDA and has been authorized for detection and/or diagnosis of SARS-CoV-2 by FDA under an Emergency Use Authorization (EUA). This EUA will remain in effect (meaning this test can be used) for the duration of the COVID-19 declaration under Section 564(b)(1) of the Act, 21 U.S.C. section 360bbb-3(b)(1), unless the authorization is terminated or revoked.  Performed at [facility], [address] [place], [place] [address]   MRSA Next Gen by PCR, Nasal     Status: None   Collection Time: 07/28/21  4:05 PM   Specimen: Nasal Mucosa; Nasal Swab  Result Value Ref Range Status   MRSA by PCR Next Gen NOT DETECTED NOT DETECTED Final    Comment: (NOTE) The GeneXpert MRSA Assay (FDA approved for NASAL specimens only), is one component of a comprehensive MRSA colonization surveillance program. It is not intended to diagnose MRSA infection nor to guide or monitor treatment for MRSA infections. Test performance is not FDA approved in patients less than 85 years old. Performed at [facility], [place] [address] [place],  [address]     Procedures/Studies: CT Abdomen Pelvis Wo Contrast  Result Date: 07/28/2021 CLINICAL DATA:  Bowel obstruction suspected nausea, vomiting EXAM: CT ABDOMEN AND PELVIS WITHOUT CONTRAST TECHNIQUE: Multidetector CT imaging of the abdomen and pelvis was performed following the standard protocol without IV contrast. COMPARISON:  None. FINDINGS: Lower chest: No acute findings Hepatobiliary: No focal hepatic abnormality. Gallbladder unremarkable. Pancreas: No focal abnormality or ductal  dilatation. Spleen: No focal abnormality.  Normal size. Adrenals/Urinary Tract: No adrenal abnormality. No focal renal abnormality. No stones or hydronephrosis. Urinary bladder is unremarkable. Stomach/Bowel: Few scattered sigmoid diverticula. No active diverticulitis. Stomach and small bowel decompressed. No evidence of bowel obstruction. Vascular/Lymphatic: Aortic atherosclerosis. No evidence of aneurysm or adenopathy. Reproductive: Prior hysterectomy.  No adnexal masses. Other: No free fluid or free air. Musculoskeletal: No acute bony abnormality. IMPRESSION: Few scattered sigmoid diverticula.  No active diverticulitis. No acute findings in the abdomen or pelvis. Aortic atherosclerosis. Electronically Signed   By: Rolm Baptise M.D.   On: 07/28/2021 10:11   US Abdomen Limited RUQ (LIVER/GB)  Result Date: 07/28/2021 CLINICAL DATA:  Elevated liver function tests EXAM: ULTRASOUND ABDOMEN LIMITED RIGHT UPPER QUADRANT COMPARISON:  CT of earlier today FINDINGS: Gallbladder: No gallstones or wall thickening visualized. No sonographic Murphy sign noted by sonographer. Common bile duct: Diameter: Normal, 4 mm. Liver: No focal lesion identified. Within normal limits in parenchymal echogenicity. Portal vein is patent on color Doppler imaging with normal direction of blood flow towards the liver. Other: None. IMPRESSION: No acute process or explanation for elevated liver function tests. Electronically Signed   By: Abigail Miyamoto M.D.   On: 07/28/2021 19:05     Time coordinating discharge: Over 30 minutes    Dwyane Dee, MD  [facility] 08/01/2021, 1:46 PM

## 2021-08-01 NOTE — Progress Notes (Signed)
AVS given to patient and explained at the bedside. Medications and follow up appointments have been explained with pt verbalizing understanding.  

## 2021-08-06 ENCOUNTER — Telehealth: Payer: Self-pay | Admitting: Internal Medicine

## 2021-08-06 ENCOUNTER — Inpatient Hospital Stay: Payer: PPO | Admitting: Internal Medicine

## 2021-08-06 DIAGNOSIS — Z800 Family history of malignant neoplasm of digestive organs: Secondary | ICD-10-CM | POA: Diagnosis not present

## 2021-08-06 DIAGNOSIS — D509 Iron deficiency anemia, unspecified: Secondary | ICD-10-CM | POA: Diagnosis not present

## 2021-08-06 DIAGNOSIS — K219 Gastro-esophageal reflux disease without esophagitis: Secondary | ICD-10-CM | POA: Diagnosis not present

## 2021-08-06 DIAGNOSIS — K5730 Diverticulosis of large intestine without perforation or abscess without bleeding: Secondary | ICD-10-CM | POA: Diagnosis not present

## 2021-08-06 DIAGNOSIS — K5904 Chronic idiopathic constipation: Secondary | ICD-10-CM | POA: Diagnosis not present

## 2021-08-06 NOTE — Telephone Encounter (Signed)
   Spoke with the nurse and the provider, OK for patient to come in on 8.12.22 for a BP reading. Pt did not answer, left a voicemail.

## 2021-08-07 ENCOUNTER — Ambulatory Visit: Payer: PPO | Admitting: Internal Medicine

## 2021-08-07 NOTE — Telephone Encounter (Signed)
Patient is aware to come into the office today to have her blood pressure checked. No visit needed with Dr. Sharlet Salina unless blood pressure is elevated.

## 2021-08-11 ENCOUNTER — Telehealth (INDEPENDENT_AMBULATORY_CARE_PROVIDER_SITE_OTHER): Payer: PPO | Admitting: Internal Medicine

## 2021-08-11 ENCOUNTER — Encounter: Payer: Self-pay | Admitting: Internal Medicine

## 2021-08-11 ENCOUNTER — Other Ambulatory Visit: Payer: Self-pay

## 2021-08-11 DIAGNOSIS — K922 Gastrointestinal hemorrhage, unspecified: Secondary | ICD-10-CM

## 2021-08-11 DIAGNOSIS — D62 Acute posthemorrhagic anemia: Secondary | ICD-10-CM | POA: Diagnosis not present

## 2021-08-11 NOTE — Progress Notes (Signed)
Virtual Visit via Video Note  I connected with Gabrielle [name] on 08/11/21 at  1:20 PM EDT by a video enabled telemedicine application and verified that I am speaking with the correct person using two identifiers.  The patient and the provider were at separate locations throughout the entire encounter. Patient location: home, Provider location: work   I discussed the limitations of evaluation and management by telemedicine and the availability of in person appointments. The patient expressed understanding and agreed to proceed. The patient and the provider were the only parties present for the visit unless noted in HPI below.  History of Present Illness: The patient is a 70 y.o. female with visit for hospital follow up. In ICU for GI bleeding with transfusion and had EGD. Got 3 units PRBC during admission. Started with feeling bad. Since discharge is doing okay from the bleeding but follow up with Dr. Collene Mares last Thursday with high BP. Denies SOB. Had blood work 9.1 Hg from last Thursday. Still very tired and has to rest often. Overall it is improving but very gradually. Taking some vitamins to see if this helps and vitamin D. BP 140/78 Friday at our office.  Observations/Objective: Appearance: appears weak, breathing appears normal, casual grooming, abdomen does not appear distended, throat not visualized, memory normal, mental status is A and O times 3  Assessment and Plan: See problem oriented charting  Follow Up Instructions: labs in 2-3 weeks, referral to home health  I discussed the assessment and treatment plan with the patient. The patient was provided an opportunity to ask questions and all were answered. The patient agreed with the plan and demonstrated an understanding of the instructions.   The patient was advised to call back or seek an in-person evaluation if the symptoms worsen or if the condition fails to improve as anticipated.  [name], MD

## 2021-08-11 NOTE — Assessment & Plan Note (Signed)
Checking CBC and and CMP and  ferritin in 2-3 weeks.

## 2021-08-11 NOTE — Assessment & Plan Note (Signed)
No dark stools since leaving hospital and Hg has increased to 9.1 last Thursday. Still needs close monitoring and will recheck in 2-3 weeks.

## 2021-08-13 ENCOUNTER — Encounter (HOSPITAL_COMMUNITY): Payer: Self-pay

## 2021-08-13 ENCOUNTER — Emergency Department (HOSPITAL_COMMUNITY): Payer: No Typology Code available for payment source

## 2021-08-13 ENCOUNTER — Other Ambulatory Visit: Payer: Self-pay

## 2021-08-13 ENCOUNTER — Emergency Department (HOSPITAL_COMMUNITY)
Admission: EM | Admit: 2021-08-13 | Discharge: 2021-08-13 | Disposition: A | Discharge status: Home / Self Care | Payer: No Typology Code available for payment source | Attending: Emergency Medicine | Admitting: Emergency Medicine

## 2021-08-13 DIAGNOSIS — Z96651 Presence of right artificial knee joint: Secondary | ICD-10-CM | POA: Diagnosis not present

## 2021-08-13 DIAGNOSIS — Z87891 Personal history of nicotine dependence: Secondary | ICD-10-CM | POA: Insufficient documentation

## 2021-08-13 DIAGNOSIS — Z20822 Contact with and (suspected) exposure to covid-19: Secondary | ICD-10-CM | POA: Insufficient documentation

## 2021-08-13 DIAGNOSIS — N281 Cyst of kidney, acquired: Secondary | ICD-10-CM | POA: Diagnosis not present

## 2021-08-13 DIAGNOSIS — I10 Essential (primary) hypertension: Secondary | ICD-10-CM | POA: Insufficient documentation

## 2021-08-13 DIAGNOSIS — Z90710 Acquired absence of both cervix and uterus: Secondary | ICD-10-CM | POA: Diagnosis not present

## 2021-08-13 DIAGNOSIS — D500 Iron deficiency anemia secondary to blood loss (chronic): Secondary | ICD-10-CM | POA: Insufficient documentation

## 2021-08-13 DIAGNOSIS — I700 Atherosclerosis of aorta: Secondary | ICD-10-CM | POA: Diagnosis not present

## 2021-08-13 DIAGNOSIS — R531 Weakness: Secondary | ICD-10-CM | POA: Diagnosis present

## 2021-08-13 DIAGNOSIS — K449 Diaphragmatic hernia without obstruction or gangrene: Secondary | ICD-10-CM | POA: Diagnosis not present

## 2021-08-13 LAB — URINALYSIS, ROUTINE W REFLEX MICROSCOPIC
Bilirubin Urine: NEGATIVE
Glucose, UA: NEGATIVE mg/dL
Hgb urine dipstick: NEGATIVE
Ketones, ur: NEGATIVE mg/dL
Leukocytes,Ua: NEGATIVE
Nitrite: NEGATIVE
Protein, ur: NEGATIVE mg/dL
Specific Gravity, Urine: 1.005 (ref 1.005–1.030)
pH: 9 — ABNORMAL HIGH (ref 5.0–8.0)

## 2021-08-13 LAB — CBC WITH DIFFERENTIAL/PLATELET
Abs Immature Granulocytes: 0.02 10*3/uL (ref 0.00–0.07)
Basophils Absolute: 0 10*3/uL (ref 0.0–0.1)
Basophils Relative: 1 %
Eosinophils Absolute: 0 10*3/uL (ref 0.0–0.5)
Eosinophils Relative: 1 %
HCT: 28.1 % — ABNORMAL LOW (ref 36.0–46.0)
Hemoglobin: 8.8 g/dL — ABNORMAL LOW (ref 12.0–15.0)
Immature Granulocytes: 0 %
Lymphocytes Relative: 33 %
Lymphs Abs: 1.5 10*3/uL (ref 0.7–4.0)
MCH: 27.4 pg (ref 26.0–34.0)
MCHC: 31.3 g/dL (ref 30.0–36.0)
MCV: 87.5 fL (ref 80.0–100.0)
Monocytes Absolute: 0.3 10*3/uL (ref 0.1–1.0)
Monocytes Relative: 7 %
Neutro Abs: 2.6 10*3/uL (ref 1.7–7.7)
Neutrophils Relative %: 58 %
Platelets: 206 10*3/uL (ref 150–400)
RBC: 3.21 MIL/uL — ABNORMAL LOW (ref 3.87–5.11)
RDW: 15.7 % — ABNORMAL HIGH (ref 11.5–15.5)
WBC: 4.5 10*3/uL (ref 4.0–10.5)
nRBC: 0 % (ref 0.0–0.2)

## 2021-08-13 LAB — COMPREHENSIVE METABOLIC PANEL
ALT: 10 U/L (ref 0–44)
AST: 21 U/L (ref 15–41)
Albumin: 3.2 g/dL — ABNORMAL LOW (ref 3.5–5.0)
Alkaline Phosphatase: 56 U/L (ref 38–126)
Anion gap: 7 (ref 5–15)
BUN: 13 mg/dL (ref 8–23)
CO2: 25 mmol/L (ref 22–32)
Calcium: 8.9 mg/dL (ref 8.9–10.3)
Chloride: 110 mmol/L (ref 98–111)
Creatinine, Ser: 0.93 mg/dL (ref 0.44–1.00)
GFR, Estimated: >60 mL/min (ref >60)
Glucose, Bld: 89 mg/dL (ref 70–99)
Potassium: 3.9 mmol/L (ref 3.5–5.1)
Sodium: 142 mmol/L (ref 135–145)
Total Bilirubin: 0.6 mg/dL (ref 0.3–1.2)
Total Protein: 6.1 g/dL — ABNORMAL LOW (ref 6.5–8.1)

## 2021-08-13 LAB — RESP PANEL BY RT-PCR (FLU A&B, COVID) ARPGX2
Influenza A by PCR: NEGATIVE
Influenza B by PCR: NEGATIVE
SARS Coronavirus 2 by RT PCR: NEGATIVE

## 2021-08-13 MED ORDER — ONDANSETRON 4 MG PO TBDP: ORAL_TABLET | ORAL | 1 refills | Status: AC

## 2021-08-13 MED ORDER — SODIUM CHLORIDE 0.9 % IV BOLUS
500.0000 mL | Freq: Once | INTRAVENOUS | Status: AC
  Administered 2021-08-13: 500 mL via INTRAVENOUS

## 2021-08-13 MED ORDER — FERROUS SULFATE 325 (65 FE) MG PO TABS: 325.0000 mg | ORAL_TABLET | Freq: Every day | ORAL | 2 refills | Status: DC

## 2021-08-13 NOTE — ED Triage Notes (Signed)
Patient c/o feeling weak . Patient states she had a procedure 2 weeks ago where she had a ruptured polyp removed. Patient did receive  blood transfusions.  Patient denies having bloody or black stools, but is feeling extremely weak.

## 2021-08-13 NOTE — Discharge Instructions (Addendum)
Follow-up with your family doctor next week for recheck. 

## 2021-08-13 NOTE — ED Provider Notes (Signed)
[facility] DEPT Provider Note   CSN: [id number] Arrival date & time: 08/13/21  0949     History Chief Complaint  Patient presents with   Weakness    Gabrielle [name] is a 70 y.o. female.  Patient has a history of bleeding from a polyp that she needed to be transfused.  Patient complains of just general weakness  The history is provided by the patient and medical records. No language interpreter was used.  Weakness Severity:  Moderate Onset quality:  Unable to specify Timing:  Constant Progression:  Waxing and waning Chronicity:  Recurrent Context: not alcohol use   Relieved by:  Nothing Worsened by:  Nothing Ineffective treatments:  None tried Associated symptoms: no abdominal pain, no chest pain, no cough, no diarrhea, no frequency, no headaches and no seizures       Past Medical History:  Diagnosis Date   Anemia    as a child   Anxiety    takes Xanax daily   Arthritis    Cataracts, bilateral    immature   Chronic insomnia [id number]   Complication of anesthesia    Constipation    will occasionally take Milk of Mag   Diverticulosis    Dizziness    rarely   GERD (gastroesophageal reflux disease)    takes Omeprazole every other day   Glaucoma    borderline and no drops required   Hearing loss    but doesn't have hearing aids   History of bronchitis    many many yrs ago   History of colitis    History of colon polyps    Hypertension    hasn't been on meds for the past 48yr    Insomnia    takes Trazodone nightly as needed and Ambien nightly    Insomnia due to substance 10/11/2014   Joint pain    Joint swelling    PONV (postoperative nausea and vomiting)    Stroke (HCC)    Tinnitus    takes HCTZ daily to decrease pressure in ears    Patient Active Problem List   Diagnosis Date Noted   Lower GI bleed 07/28/2021   Rectal bleeding 07/28/2021   Acute blood loss anemia    Gastrointestinal hemorrhage with melena    Leg  swelling 07/09/2021   Right hip pain 05/13/2021   Acute bilateral low back pain with right-sided sciatica 05/13/2021   Hives 04/09/2021   Posterior vitreous detachment of both eyes 02/25/2021   Optic nerve atrophy 02/25/2021   Pseudophakia 02/25/2021   Vitreous membranes and strands, right eye 02/25/2021   Right thigh pain 01/22/2021   Hearing loss 08/14/2020   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   Essential (primary) hypertension 08/14/2020   Anxiety and depression 08/14/2020   GAD (generalized anxiety disorder) 08/14/2020   [place] 05/01/2020   Family history of colon cancer 05/01/2020   Diverticular disease of colon 05/01/2020   Chronic idiopathic constipation 05/01/2020   Decreased vision of left eye 02/28/2020   Elevated blood pressure reading in office without diagnosis of hypertension 12/15/2018   Blindness 09/14/2018   GERD (gastroesophageal reflux disease) 04/21/2018   TMJ pain dysfunction syndrome 03/23/2018   Pulsatile tinnitus of both ears 03/23/2018   Chronic post-traumatic stress disorder (PTSD) 03/14/2018   Tinnitus 03/14/2018   Routine general medical examination at a health care facility 03/14/2018   Left ear pain 05/13/2017   Presbycusis of both ears 03/30/2017   Abnormal auditory  perception of both ears 06/03/2016   Neck pain 03/12/2016   Abnormality of gait 06/03/2015   S/P TKR (total knee replacement) 06/03/2015   Chronic left SI joint pain 06/03/2015   Insomnia 10/11/2014   Drug-induced sleep disorder ([place]) 10/11/2014   DJD (degenerative joint disease) of knee 03/13/2014   Sensorineural hearing loss, bilateral 11/27/2013    Past Surgical History:  Procedure Laterality Date   ABDOMINAL HYSTERECTOMY     APPENDECTOMY     COLONOSCOPY     ESOPHAGOGASTRODUODENOSCOPY (EGD) WITH PROPOFOL N/A 07/29/2021   Procedure: ESOPHAGOGASTRODUODENOSCOPY (EGD) WITH PROPOFOL;  Surgeon: [name], MD;  Location: [facility];  Service: Endoscopy;   Laterality: N/A;   HEMOSTASIS CLIP PLACEMENT  07/29/2021   Procedure: HEMOSTASIS CLIP PLACEMENT;  Surgeon: [name], MD;  Location: [facility];  Service: Endoscopy;;   IR RADIOLOGIST EVAL & MGMT  06/08/2017   POLYPECTOMY  07/29/2021   Procedure: POLYPECTOMY;  Surgeon: [name], MD;  Location: [facility];  Service: Endoscopy;;   tinnitus Right    TONSILLECTOMY     TOTAL KNEE ARTHROPLASTY Right 03/13/2014   DR MURPHY   TOTAL KNEE ARTHROPLASTY Right 03/13/2014   Procedure: TOTAL KNEE ARTHROPLASTY;  Surgeon: [name], MD;  Location: [facility];  Service: Orthopedics;  Laterality: Right;   TUBAL LIGATION       OB History   No obstetric history on file.     Family History  Problem Relation Age of Onset   Depression Mother    Cancer - Other Father    Kidney disease Father    Cancer - Other Sister        breast ca   Cancer - Other Brother        lymphoma in remission   Lymphoma Brother    Cancer - Other Sister        in remission breast ca    Social History   Tobacco Use   Smoking status: Former    Packs/day: 1.00    Years: 22.00    Pack years: 22.00    Types: Cigarettes    Start date: 03/08/1972    Quit date: 01/08/1994    Years since quitting: 27.6   Smokeless tobacco: Never   Tobacco comments:    quit smoking 96yr ago  Vaping Use   Vaping Use: Never used  Substance Use Topics   Alcohol use: No    Alcohol/week: 0.0 standard drinks   Drug use: No    Home Medications Prior to Admission medications   Medication Sig Start Date End Date Taking? Authorizing Provider  ALPRAZolam (XANAX) 1 MG tablet TAKE 1 TABLET BY MOUTH 3 TIMES DAILY. Patient taking differently: Take 1 mg by mouth 3 (three) times daily as needed for anxiety. 05/21/21  Yes CHoyt Koch MD  carboxymethylcellulose (REFRESH PLUS) 0.5 % SOLN Place 1 drop into both eyes 3 (three) times daily as needed (dry eyes).   Yes [provider]  esomeprazole (NEXIUM) 40 MG capsule TAKE 1  CAPSULE (40 MG TOTAL) BY MOUTH 2 (TWO) TIMES DAILY BEFORE A MEAL. Patient taking differently: Take 40 mg by mouth daily. 03/26/21  Yes CHoyt Koch MD  ferrous sulfate 325 (65 FE) MG tablet Take 1 tablet (325 mg total) by mouth daily. 08/13/21  Yes ZMilton Ferguson MD  Multiple Vitamin (MULTIVITAMIN WITH MINERALS) TABS tablet Take 1 tablet by mouth daily.   Yes [provider]  ondansetron (ZOFRAN ODT) 4 MG disintegrating tablet '4mg'$  ODT q4 hours  prn nausea/vomit 08/13/21  Yes Milton Ferguson, MD  VITAMIN D PO Take 1 tablet by mouth daily.   Yes [provider]  zolpidem (AMBIEN) 10 MG tablet TAKE 1 TO 1 & 1/2 TABLET AT BEDTIME AS NEEDED FOR SLEEP Patient taking differently: Take 10 mg by mouth at bedtime as needed for sleep. 05/04/21  Yes [name], MD    Allergies    Iodinated diagnostic agents, Other, Shellfish allergy, Gadolinium derivatives, Hydrocodone, Metronidazole, Acetazolamide, Gabapentin, and Mirtazapine  Review of Systems   Review of Systems  Constitutional:  Negative for appetite change and fatigue.  HENT:  Negative for congestion, ear discharge and sinus pressure.   Eyes:  Negative for discharge.  Respiratory:  Negative for cough.   Cardiovascular:  Negative for chest pain.  Gastrointestinal:  Negative for abdominal pain and diarrhea.  Genitourinary:  Negative for frequency and hematuria.  Musculoskeletal:  Negative for back pain.  Skin:  Negative for rash.  Neurological:  Positive for weakness. Negative for seizures and headaches.  Psychiatric/Behavioral:  Negative for hallucinations.    Physical Exam Updated Vital Signs BP (!) 157/78   Pulse (!) 57   Temp 98 F (36.7 C)   Resp 16   Ht '5\' 4"'$  (1.626 m)   Wt 100.7 kg   SpO2 100%   BMI 38.11 kg/m   Physical Exam Vitals and nursing note reviewed.  Constitutional:      Appearance: She is well-developed.  HENT:     Head: Normocephalic.     Nose: Nose normal.  Eyes:     General:  No scleral icterus.    Conjunctiva/sclera: Conjunctivae normal.  Neck:     Thyroid: No thyromegaly.  Cardiovascular:     Rate and Rhythm: Normal rate and regular rhythm.     Heart sounds: No murmur heard.   No friction rub. No gallop.  Pulmonary:     Breath sounds: No stridor. No wheezing or rales.  Chest:     Chest wall: No tenderness.  Abdominal:     General: There is no distension.     Tenderness: There is no abdominal tenderness. There is no rebound.  Musculoskeletal:        General: Normal range of motion.     Cervical back: Neck supple.  Lymphadenopathy:     Cervical: No cervical adenopathy.  Skin:    Findings: No erythema or rash.  Neurological:     Mental Status: She is alert and oriented to person, place, and time.     Motor: No abnormal muscle tone.     Coordination: Coordination normal.  Psychiatric:        Behavior: Behavior normal.    ED Results / Procedures / Treatments   Labs (all labs ordered are listed, but only abnormal results are displayed) Labs Reviewed  CBC WITH DIFFERENTIAL/PLATELET - Abnormal; Notable for the following components:      Result Value   RBC 3.21 (*)    Hemoglobin 8.8 (*)    HCT 28.1 (*)    RDW 15.7 (*)    All other components within normal limits  COMPREHENSIVE METABOLIC PANEL - Abnormal; Notable for the following components:   Total Protein 6.1 (*)    Albumin 3.2 (*)    All other components within normal limits  URINALYSIS, ROUTINE W REFLEX MICROSCOPIC - Abnormal; Notable for the following components:   Color, Urine STRAW (*)    pH 9.0 (*)    All other components within normal limits  RESP PANEL BY RT-PCR (FLU A&B, COVID) [id number]    EKG None  Radiology CT ABDOMEN PELVIS WO CONTRAST  Result Date: 08/13/2021 CLINICAL DATA:  Abdominal abscess suspected EXAM: CT ABDOMEN AND PELVIS WITHOUT CONTRAST TECHNIQUE: Multidetector CT imaging of the abdomen and pelvis was performed following the standard protocol without IV contrast.  COMPARISON:  CT abdomen pelvis dated July 28, 2021 FINDINGS: Lower chest: No acute abnormality. Hepatobiliary: No focal liver abnormality is seen. No gallstones, gallbladder wall thickening, or biliary dilatation. Pancreas: Unremarkable Spleen: Unremarkable Adrenals/Urinary Tract: Unchanged left adrenal gland nodule measuring 1.4 cm. Bilateral renal sinus cysts. No evidence of hydronephrosis or nephrolithiasis. Bladder is unremarkable. Stomach/Bowel: Small hiatal hernia. Surgical clip noted in the stomach. No evidence of obstruction, wall thickening or inflammatory change. Scattered diverticula of the sigmoid colon. Vascular/Lymphatic: Aortic atherosclerosis. No enlarged abdominal or pelvic lymph nodes. Reproductive: Status post hysterectomy. No adnexal masses. Other: Tiny fat containing umbilical hernia no abdominopelvic ascites. Musculoskeletal: No acute or significant osseous findings. IMPRESSION: No acute findings in the abdomen or pelvis. Aortic Atherosclerosis (ICD10-I70.0). Electronically Signed   By: Yetta Glassman M.D.   On: 08/13/2021 13:56    Procedures Procedures   Medications Ordered in ED Medications  sodium chloride 0.9 % bolus 500 mL (500 mLs Intravenous Bolus 08/13/21 1053)    ED Course  I have reviewed the triage vital signs and the nursing notes.  Pertinent labs & imaging results that were available during my care of the patient were reviewed by me and considered in my medical decision making (see chart for details).    MDM Rules/Calculators/A&P                           Patient with general weakness and stable anemia.  She will try some iron to help increase her hemoglobin.  She stated that iron is to make her nauseated so she is given some Zofran also.  Patient will follow up with her PCP Final Clinical Impression(s) / ED Diagnoses Final diagnoses:  Iron deficiency anemia due to chronic blood loss    Rx / DC Orders ED Discharge Orders          Ordered    ferrous  sulfate 325 (65 FE) MG tablet  Daily        08/13/21 1418    ondansetron (ZOFRAN ODT) 4 MG disintegrating tablet        08/13/21 1418             Milton Ferguson, MD 08/13/21 1420

## 2021-08-19 ENCOUNTER — Encounter (INDEPENDENT_AMBULATORY_CARE_PROVIDER_SITE_OTHER): Payer: Self-pay

## 2021-08-19 DIAGNOSIS — H11823 Conjunctivochalasis, bilateral: Secondary | ICD-10-CM | POA: Diagnosis not present

## 2021-08-19 DIAGNOSIS — H47012 Ischemic optic neuropathy, left eye: Secondary | ICD-10-CM | POA: Diagnosis not present

## 2021-08-19 DIAGNOSIS — H0288B Meibomian gland dysfunction left eye, upper and lower eyelids: Secondary | ICD-10-CM | POA: Diagnosis not present

## 2021-08-19 DIAGNOSIS — Z9889 Other specified postprocedural states: Secondary | ICD-10-CM | POA: Diagnosis not present

## 2021-08-19 DIAGNOSIS — H0288A Meibomian gland dysfunction right eye, upper and lower eyelids: Secondary | ICD-10-CM | POA: Diagnosis not present

## 2021-08-19 DIAGNOSIS — H40053 Ocular hypertension, bilateral: Secondary | ICD-10-CM | POA: Diagnosis not present

## 2021-08-19 DIAGNOSIS — H16223 Keratoconjunctivitis sicca, not specified as Sjogren's, bilateral: Secondary | ICD-10-CM | POA: Diagnosis not present

## 2021-08-19 DIAGNOSIS — H43813 Vitreous degeneration, bilateral: Secondary | ICD-10-CM | POA: Diagnosis not present

## 2021-08-19 DIAGNOSIS — D3132 Benign neoplasm of left choroid: Secondary | ICD-10-CM | POA: Diagnosis not present

## 2021-08-19 DIAGNOSIS — Z961 Presence of intraocular lens: Secondary | ICD-10-CM | POA: Diagnosis not present

## 2021-08-20 ENCOUNTER — Telehealth: Payer: Self-pay | Admitting: Internal Medicine

## 2021-08-20 NOTE — Telephone Encounter (Signed)
Patient would like to speak to provider or nurse regarding iron supplement  Please callback [phone]

## 2021-08-20 NOTE — Telephone Encounter (Signed)
Cassandra calling in requesting to speak with provider nurse  Wants to discuss possible home health care referral for patient  Please call [phone]

## 2021-08-21 NOTE — Telephone Encounter (Signed)
Called Gabrielle [name]. LVM asking her to give our office a call back. Office number was provided.

## 2021-08-21 NOTE — Telephone Encounter (Signed)
Unable to get in contact with the patient. LVM asking her to return my call here at the office.

## 2021-08-24 NOTE — Addendum Note (Signed)
Addended by: Thomes Cake on: 08/24/2021 04:46 PM   Modules accepted: Orders

## 2021-08-24 NOTE — Telephone Encounter (Signed)
See below

## 2021-08-24 NOTE — Telephone Encounter (Signed)
I am okay with referral going wherever patient wants. That request can go to referrals.

## 2021-08-24 NOTE — Telephone Encounter (Signed)
Please advise as Gabrielle [name] with [employer] has called and stated the pt has reported she feels down, depressed and hopeless every day the past 2 weeks due to her recent hospitalization. Gabrielle [name] can be reached at [phone] for any further questions or concerns.  **She is also calling inquiring about the referral or if a referral was made for the pt to receive [facility] assistance to help with her vitals, etc.   **Pt would like referral to go to [facility] for home support with custodial care. TEL: [phone]. Pt states her sister works there and prefer her to be the one to come take care of her then a stranger due to [place].

## 2021-08-26 ENCOUNTER — Telehealth: Payer: Self-pay | Admitting: Internal Medicine

## 2021-08-26 NOTE — Telephone Encounter (Signed)
Cecille Rubin ([facility]) calling in on behalf of patient  Would like to delay patient start of care till 09.02.22

## 2021-08-26 NOTE — Telephone Encounter (Signed)
Fyi.

## 2021-08-26 NOTE — Telephone Encounter (Signed)
Follow up message  Mickel Baas from [facility] calling to request order for 20 hours of custodial care. Phone [phone] Fax [phone]

## 2021-08-27 NOTE — Telephone Encounter (Signed)
See below

## 2021-08-28 NOTE — Telephone Encounter (Signed)
Rhonda from [employer] calling back to check status of referral for Gabrielle [name]  Says they have delayed patient care until 09.05 ... also states referral has to be completed by today or patient is not going to be able to receive care  Contact Info: Cecille [phone] (f)

## 2021-08-28 NOTE — Telephone Encounter (Signed)
They need the referral filled in.

## 2021-08-28 NOTE — Telephone Encounter (Signed)
See below

## 2021-08-28 NOTE — Telephone Encounter (Signed)
Fine for verbal.  

## 2021-09-01 ENCOUNTER — Encounter (HOSPITAL_BASED_OUTPATIENT_CLINIC_OR_DEPARTMENT_OTHER): Payer: Self-pay | Admitting: Emergency Medicine

## 2021-09-01 ENCOUNTER — Emergency Department (HOSPITAL_BASED_OUTPATIENT_CLINIC_OR_DEPARTMENT_OTHER): Payer: No Typology Code available for payment source | Admitting: Radiology

## 2021-09-01 ENCOUNTER — Emergency Department (HOSPITAL_BASED_OUTPATIENT_CLINIC_OR_DEPARTMENT_OTHER)
Admission: EM | Admit: 2021-09-01 | Discharge: 2021-09-01 | Disposition: A | Discharge status: Home / Self Care | Payer: No Typology Code available for payment source | Attending: Emergency Medicine | Admitting: Emergency Medicine

## 2021-09-01 ENCOUNTER — Telehealth: Payer: Self-pay

## 2021-09-01 ENCOUNTER — Telehealth: Payer: Self-pay | Admitting: Internal Medicine

## 2021-09-01 ENCOUNTER — Other Ambulatory Visit: Payer: Self-pay

## 2021-09-01 DIAGNOSIS — Z87891 Personal history of nicotine dependence: Secondary | ICD-10-CM | POA: Diagnosis not present

## 2021-09-01 DIAGNOSIS — Z96651 Presence of right artificial knee joint: Secondary | ICD-10-CM | POA: Diagnosis not present

## 2021-09-01 DIAGNOSIS — R002 Palpitations: Secondary | ICD-10-CM | POA: Diagnosis not present

## 2021-09-01 DIAGNOSIS — D649 Anemia, unspecified: Secondary | ICD-10-CM | POA: Diagnosis not present

## 2021-09-01 DIAGNOSIS — I10 Essential (primary) hypertension: Secondary | ICD-10-CM | POA: Insufficient documentation

## 2021-09-01 DIAGNOSIS — R0602 Shortness of breath: Secondary | ICD-10-CM | POA: Insufficient documentation

## 2021-09-01 DIAGNOSIS — R531 Weakness: Secondary | ICD-10-CM | POA: Diagnosis present

## 2021-09-01 DIAGNOSIS — Z8616 Personal history of COVID-19: Secondary | ICD-10-CM | POA: Insufficient documentation

## 2021-09-01 DIAGNOSIS — R5383 Other fatigue: Secondary | ICD-10-CM | POA: Diagnosis not present

## 2021-09-01 LAB — URINALYSIS, ROUTINE W REFLEX MICROSCOPIC
Bilirubin Urine: NEGATIVE
Glucose, UA: NEGATIVE mg/dL
Hgb urine dipstick: NEGATIVE
Ketones, ur: NEGATIVE mg/dL
Nitrite: NEGATIVE
Protein, ur: NEGATIVE mg/dL
Specific Gravity, Urine: <1.005 — ABNORMAL LOW (ref 1.005–1.030)
pH: 7.5 (ref 5.0–8.0)

## 2021-09-01 LAB — CBC WITH DIFFERENTIAL/PLATELET
Abs Immature Granulocytes: 0 10*3/uL (ref 0.00–0.07)
Basophils Absolute: 0 10*3/uL (ref 0.0–0.1)
Basophils Relative: 1 %
Eosinophils Absolute: 0.1 10*3/uL (ref 0.0–0.5)
Eosinophils Relative: 2 %
HCT: 30.2 % — ABNORMAL LOW (ref 36.0–46.0)
Hemoglobin: 9.3 g/dL — ABNORMAL LOW (ref 12.0–15.0)
Immature Granulocytes: 0 %
Lymphocytes Relative: 33 %
Lymphs Abs: 1.3 10*3/uL (ref 0.7–4.0)
MCH: 24.8 pg — ABNORMAL LOW (ref 26.0–34.0)
MCHC: 30.8 g/dL (ref 30.0–36.0)
MCV: 80.5 fL (ref 80.0–100.0)
Monocytes Absolute: 0.2 10*3/uL (ref 0.1–1.0)
Monocytes Relative: 6 %
Neutro Abs: 2.4 10*3/uL (ref 1.7–7.7)
Neutrophils Relative %: 58 %
Platelets: 161 10*3/uL (ref 150–400)
RBC: 3.75 MIL/uL — ABNORMAL LOW (ref 3.87–5.11)
RDW: 17.4 % — ABNORMAL HIGH (ref 11.5–15.5)
WBC: 4 10*3/uL (ref 4.0–10.5)
nRBC: 0 % (ref 0.0–0.2)

## 2021-09-01 LAB — COMPREHENSIVE METABOLIC PANEL
ALT: 7 U/L (ref 0–44)
AST: 12 U/L — ABNORMAL LOW (ref 15–41)
Albumin: 3.6 g/dL (ref 3.5–5.0)
Alkaline Phosphatase: 65 U/L (ref 38–126)
Anion gap: 9 (ref 5–15)
BUN: 10 mg/dL (ref 8–23)
CO2: 27 mmol/L (ref 22–32)
Calcium: 8.9 mg/dL (ref 8.9–10.3)
Chloride: 106 mmol/L (ref 98–111)
Creatinine, Ser: 0.85 mg/dL (ref 0.44–1.00)
GFR, Estimated: >60 mL/min (ref >60)
Glucose, Bld: 116 mg/dL — ABNORMAL HIGH (ref 70–99)
Potassium: 3.6 mmol/L (ref 3.5–5.1)
Sodium: 142 mmol/L (ref 135–145)
Total Bilirubin: 0.4 mg/dL (ref 0.3–1.2)
Total Protein: 6.6 g/dL (ref 6.5–8.1)

## 2021-09-01 LAB — VITAMIN B12: Vitamin B-12: 240 pg/mL (ref 180–914)

## 2021-09-01 LAB — IRON AND TIBC
Iron: 64 ug/dL (ref 28–170)
Saturation Ratios: 15 % (ref 10.4–31.8)
TIBC: 427 ug/dL (ref 250–450)
UIBC: 363 ug/dL

## 2021-09-01 LAB — BRAIN NATRIURETIC PEPTIDE: B Natriuretic Peptide: 54.3 pg/mL (ref 0.0–100.0)

## 2021-09-01 LAB — TROPONIN I (HIGH SENSITIVITY): Troponin I (High Sensitivity): 3 ng/L (ref <18)

## 2021-09-01 LAB — FERRITIN: Ferritin: 4 ng/mL — ABNORMAL LOW (ref 11–307)

## 2021-09-01 NOTE — ED Triage Notes (Signed)
Pt arrives to ED with c/o of weakness. Pt reports the weakness has been consistent since her admission to the ICU last month. Pt reports malaise and fatigue. No CP or SOB, however is having palpitations. Pt denies melena and BRBPR. Minimal nausea and no vomiting. No abdominal pain. Pt reports that she took her first dose of her iron pills today.

## 2021-09-01 NOTE — Telephone Encounter (Signed)
It would appear she went to ER today and had labs drawn then, would not recommend further until visit.

## 2021-09-01 NOTE — Telephone Encounter (Signed)
There was a referral done on 08/11/21 which can be faxed to them if needed.

## 2021-09-01 NOTE — ED Provider Notes (Signed)
[facility] EMERGENCY DEPT Provider Note   CSN: [id number] Arrival date & time: 09/01/21  [id number]     History Chief Complaint  Patient presents with   Weakness    Gabrielle [name] is a 70 y.o. female.  She has been to the emergency department 1 other time since discharge from the hospital.  She has also followed up with the [place].  She states that she is really unable to do much in regards to daily activities.  Prior to her hospitalization, she was not doing any cooking, but she was able to make a bed.  She was able to run simple errands.  Now, she states that she would get extremely short of breath and the distance between her car and the door of a store.  The history is provided by the patient.  Weakness Severity:  Severe Onset quality:  Gradual Duration: several weeks. Timing:  Constant Progression:  Waxing and waning Chronicity:  New Context comment:  Hospitalized for severe anemia due to bleeding gastric polyp at the start of August. Has been feeling weak since hospital discharge. Relieved by:  Nothing Worsened by:  Activity Ineffective treatments:  Rest Associated symptoms: anorexia and shortness of breath   Associated symptoms: no abdominal pain, no aphasia, no arthralgias, no ataxia, no chest pain, no cough, no diarrhea, no dysuria, no numbness in extremities, no falls, no fever, no nausea, no seizures, no sensory-motor deficit and no vomiting       Past Medical History:  Diagnosis Date   Anemia    as a child   Anxiety    takes Xanax daily   Arthritis    Cataracts, bilateral    immature   Chronic insomnia [id number]   Complication of anesthesia    Constipation    will occasionally take Milk of Mag   Diverticulosis    Dizziness    rarely   GERD (gastroesophageal reflux disease)    takes Omeprazole every other day   Glaucoma    borderline and no drops required   Hearing loss    but doesn't have hearing aids   History of bronchitis    many many yrs  ago   History of colitis    History of colon polyps    Hypertension    hasn't been on meds for the past 15yr    Insomnia    takes Trazodone nightly as needed and Ambien nightly    Insomnia due to substance 10/11/2014   Joint pain    Joint swelling    PONV (postoperative nausea and vomiting)    Stroke (HCC)    Tinnitus    takes HCTZ daily to decrease pressure in ears    Patient Active Problem List   Diagnosis Date Noted   Lower GI bleed 07/28/2021   Rectal bleeding 07/28/2021   Acute blood loss anemia    Gastrointestinal hemorrhage with melena    Leg swelling 07/09/2021   Right hip pain 05/13/2021   Acute bilateral low back pain with right-sided sciatica 05/13/2021   Hives 04/09/2021   Posterior vitreous detachment of both eyes 02/25/2021   Optic nerve atrophy 02/25/2021   Pseudophakia 02/25/2021   Vitreous membranes and strands, right eye 02/25/2021   Right thigh pain 01/22/2021   Hearing loss 08/14/2020   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   Essential (primary) hypertension 08/14/2020   Anxiety and depression 08/14/2020   GAD (generalized anxiety disorder) 08/14/2020   Morbid [place] 05/01/2020   Family  history of colon cancer 05/01/2020   Diverticular disease of colon 05/01/2020   Chronic idiopathic constipation 05/01/2020   Decreased vision of left eye 02/28/2020   Elevated blood pressure reading in office without diagnosis of hypertension 12/15/2018   Blindness 09/14/2018   GERD (gastroesophageal reflux disease) 04/21/2018   TMJ pain dysfunction syndrome 03/23/2018   Pulsatile tinnitus of both ears 03/23/2018   Chronic post-traumatic stress disorder (PTSD) 03/14/2018   Tinnitus 03/14/2018   Routine general medical examination at a health care facility 03/14/2018   Left ear pain 05/13/2017   Presbycusis of both ears 03/30/2017   Abnormal auditory perception of both ears 06/03/2016   Neck pain 03/12/2016   Abnormality of gait 06/03/2015   S/P  TKR (total knee replacement) 06/03/2015   Chronic left SI joint pain 06/03/2015   Insomnia 10/11/2014   Drug-induced sleep disorder ([place]) 10/11/2014   DJD (degenerative joint disease) of knee 03/13/2014   Sensorineural hearing loss, bilateral 11/27/2013    Past Surgical History:  Procedure Laterality Date   ABDOMINAL HYSTERECTOMY     APPENDECTOMY     COLONOSCOPY     ESOPHAGOGASTRODUODENOSCOPY (EGD) WITH PROPOFOL N/A 07/29/2021   Procedure: ESOPHAGOGASTRODUODENOSCOPY (EGD) WITH PROPOFOL;  Surgeon: [name], MD;  Location: [facility];  Service: Endoscopy;  Laterality: N/A;   HEMOSTASIS CLIP PLACEMENT  07/29/2021   Procedure: HEMOSTASIS CLIP PLACEMENT;  Surgeon: [name], MD;  Location: [facility];  Service: Endoscopy;;   IR RADIOLOGIST EVAL & MGMT  06/08/2017   POLYPECTOMY  07/29/2021   Procedure: POLYPECTOMY;  Surgeon: [name], MD;  Location: [facility];  Service: Endoscopy;;   tinnitus Right    TONSILLECTOMY     TOTAL KNEE ARTHROPLASTY Right 03/13/2014   DR MURPHY   TOTAL KNEE ARTHROPLASTY Right 03/13/2014   Procedure: TOTAL KNEE ARTHROPLASTY;  Surgeon: [name], MD;  Location: [facility];  Service: Orthopedics;  Laterality: Right;   TUBAL LIGATION       OB History   No obstetric history on file.     Family History  Problem Relation Age of Onset   Depression Mother    Cancer - Other Father    Kidney disease Father    Cancer - Other Sister        breast ca   Cancer - Other Brother        lymphoma in remission   Lymphoma Brother    Cancer - Other Sister        in remission breast ca    Social History   Tobacco Use   Smoking status: Former    Packs/day: 1.00    Years: 22.00    Pack years: 22.00    Types: Cigarettes    Start date: 03/08/1972    Quit date: 01/08/1994    Years since quitting: 27.6   Smokeless tobacco: Never   Tobacco comments:    quit smoking 45yr ago  Vaping Use   Vaping Use: Never used  Substance Use Topics   Alcohol use: No     Alcohol/week: 0.0 standard drinks   Drug use: No    Home Medications Prior to Admission medications   Medication Sig Start Date End Date Taking? Authorizing Provider  ALPRAZolam (XANAX) 1 MG tablet TAKE 1 TABLET BY MOUTH 3 TIMES DAILY. Patient taking differently: Take 1 mg by mouth 3 (three) times daily as needed for anxiety. 05/21/21   CHoyt Koch MD  carboxymethylcellulose (REFRESH PLUS) 0.5 % SOLN Place 1 drop into both eyes  3 (three) times daily as needed (dry eyes).    [provider]  esomeprazole (NEXIUM) 40 MG capsule TAKE 1 CAPSULE (40 MG TOTAL) BY MOUTH 2 (TWO) TIMES DAILY BEFORE A MEAL. Patient taking differently: Take 40 mg by mouth daily. 03/26/21   [name], MD  ferrous sulfate 325 (65 FE) MG tablet Take 1 tablet (325 mg total) by mouth daily. 08/13/21   Milton Ferguson, MD  Multiple Vitamin (MULTIVITAMIN WITH MINERALS) TABS tablet Take 1 tablet by mouth daily.    [provider]  ondansetron (ZOFRAN ODT) 4 MG disintegrating tablet '4mg'$  ODT q4 hours prn nausea/vomit 08/13/21   Milton Ferguson, MD  VITAMIN D PO Take 1 tablet by mouth daily.    [provider]  zolpidem (AMBIEN) 10 MG tablet TAKE 1 TO 1 & 1/2 TABLET AT BEDTIME AS NEEDED FOR SLEEP Patient taking differently: Take 10 mg by mouth at bedtime as needed for sleep. 05/04/21   [name], MD    Allergies    Iodinated diagnostic agents, Other, Shellfish allergy, Gadolinium derivatives, Hydrocodone, Metronidazole, Acetazolamide, Gabapentin, and Mirtazapine  Review of Systems   Review of Systems  Constitutional:  Negative for chills and fever.  HENT:  Negative for ear pain and sore throat.   Eyes:  Negative for pain and visual disturbance.  Respiratory:  Positive for shortness of breath. Negative for cough.   Cardiovascular:  Negative for chest pain and palpitations.  Gastrointestinal:  Positive for anorexia and constipation. Negative for abdominal pain, diarrhea,  nausea and vomiting.  Genitourinary:  Negative for dysuria and hematuria.  Musculoskeletal:  Negative for arthralgias, back pain and falls.  Skin:  Negative for color change and rash.  Neurological:  Positive for weakness. Negative for seizures and syncope.  Psychiatric/Behavioral:  Positive for dysphoric mood.   All other systems reviewed and are negative.  Physical Exam Updated Vital Signs BP (!) 165/76 (BP Location: Right Arm)   Pulse 76   Temp 98 F (36.7 C) (Oral)   Resp (!) 24   Ht '5\' 4"'$  (1.626 m)   Wt 99.8 kg   SpO2 100%   BMI 37.76 kg/m   Physical Exam Vitals and nursing note reviewed.  Constitutional:      Appearance: She is well-developed.  HENT:     Head: Normocephalic and atraumatic.  Cardiovascular:     Rate and Rhythm: Normal rate and regular rhythm.     Heart sounds: Normal heart sounds.  Pulmonary:     Effort: Pulmonary effort is normal. No tachypnea.     Breath sounds: Normal breath sounds.  Abdominal:     Palpations: Abdomen is soft.     Tenderness: There is no abdominal tenderness.  Musculoskeletal:     Right lower leg: No edema.     Left lower leg: No edema.  Skin:    General: Skin is warm and dry.  Neurological:     General: No focal deficit present.     Mental Status: She is alert and oriented to person, place, and time.  Psychiatric:        Mood and Affect: Mood normal.        Behavior: Behavior normal.    ED Results / Procedures / Treatments   Labs (all labs ordered are listed, but only abnormal results are displayed) Labs Reviewed  URINALYSIS, ROUTINE W REFLEX MICROSCOPIC - Abnormal; Notable for the following components:      Result Value   Color, Urine COLORLESS (*)  Specific Gravity, Urine <1.005 (*)    Leukocytes,Ua MODERATE (*)    All other components within normal limits  CBC WITH DIFFERENTIAL/PLATELET - Abnormal; Notable for the following components:   RBC 3.75 (*)    Hemoglobin 9.3 (*)    HCT 30.2 (*)    MCH 24.8 (*)     RDW 17.4 (*)    All other components within normal limits  COMPREHENSIVE METABOLIC PANEL - Abnormal; Notable for the following components:   Glucose, Bld 116 (*)    AST 12 (*)    All other components within normal limits  BRAIN NATRIURETIC PEPTIDE  IRON AND TIBC  FERRITIN  VITAMIN B12  TROPONIN I (HIGH SENSITIVITY)    EKG EKG Interpretation  Date/Time:  Tuesday September 01 2021 10:12:54 EDT Ventricular Rate:  70 PR Interval:  173 QRS Duration: 82 QT Interval:  413 QTC Calculation: 446 R Axis:   39 Text Interpretation: Sinus rhythm Consider left atrial enlargement normal axis No acute ischemia Confirmed by Lorre Munroe ([id number]) on 09/01/2021 10:16:06 AM  Radiology DG Chest 2 View  Result Date: 09/01/2021 CLINICAL DATA:  Dyspnea on exertion EXAM: CHEST - 2 VIEW COMPARISON:  10/13/2019 FINDINGS: Unchanged cardiac and mediastinal contours. Atherosclerotic plaque in the imaged aorta. Unchanged bilateral medial opacities, which correlate with a prominent epicardial fat pad on the patient most recent CT. No pleural effusion or pneumothorax. No new focal consolidative opacity. Imaged abdomen is unremarkable. No acute osseous abnormality. IMPRESSION: No active cardiopulmonary disease. Electronically Signed   By: Merilyn Baba M.D.   On: 09/01/2021 11:14    Procedures Procedures   Medications Ordered in ED Medications - No data to display  ED Course  I have reviewed the triage vital signs and the nursing notes.  Pertinent labs & imaging results that were available during my care of the patient were reviewed by me and considered in my medical decision making (see chart for details).    MDM Rules/Calculators/A&P                           ANNALAYA KHOURI presents to the emergency department complaining of fatigue and palpitations.  She has recently undergone a hospitalization for extreme symptomatic anemia due to a bleeding gastric polyp.  She has found it hard to bounce back completely  despite taking iron supplementations.  She was evaluated here for evidence of acute pathology such as congestive heart failure, ACS, metabolic abnormality, pneumonia.  She is interested in knowing whether or not she will be a candidate for iron infusions as she has had some constipation with her oral supplementation.  Iron studies are pending, and she was referred back to her primary care doctor for further discussion.  B12 level is also pending.  I recommended that she consider cardiology referral for continuous monitoring if palpitations are persistent.  We also discussed physical therapy for some deconditioning that may have occurred during her recent illness.  Finally, we discussed mental and emotional health that comes with a critical illness.  She was discharged home with recommendations for close follow-up. Final Clinical Impression(s) / ED Diagnoses Final diagnoses:  Other fatigue  Anemia, unspecified type  Palpitations    Rx / DC Orders ED Discharge Orders     None        Arnaldo Natal, MD 09/01/21 1334

## 2021-09-01 NOTE — Telephone Encounter (Signed)
See below

## 2021-09-01 NOTE — Telephone Encounter (Signed)
Patient having trouble tolerating the iron supplement on stomach  Wants to know different options to help w/ this  Please call patient as soon as possible

## 2021-09-01 NOTE — Telephone Encounter (Signed)
Please advise as the pt states she is in need of labs being done to check her B12, Hemoglobin and Iron as she is very weak and had to get 5 units of blood back in August. Pt states she can not wait to come see you on Sept 8th in which it was available at the time of this call. She stated she could not wait till then and just would like her blood checked to see if she can get help with her weakness.

## 2021-09-01 NOTE — ED Notes (Addendum)
Pt ambulated in hall, pt's gait steady, and 02 sats remained around 98-99%, HR 81. Pt stated she just feels like her heart is "fluttering". No dizziness/SHOB.

## 2021-09-02 NOTE — Telephone Encounter (Signed)
See below

## 2021-09-03 ENCOUNTER — Other Ambulatory Visit: Payer: Self-pay

## 2021-09-03 NOTE — Telephone Encounter (Signed)
Called patient. LVM asking her to give our office a call back. Office number was provided.

## 2021-09-03 NOTE — Telephone Encounter (Signed)
Okay to hold oral iron for now and have follow up with me to discuss IV iron.

## 2021-09-04 ENCOUNTER — Encounter: Payer: Self-pay | Admitting: Internal Medicine

## 2021-09-04 ENCOUNTER — Ambulatory Visit (INDEPENDENT_AMBULATORY_CARE_PROVIDER_SITE_OTHER): Payer: No Typology Code available for payment source | Admitting: Internal Medicine

## 2021-09-04 VITALS — BP 130/80 | HR 74 | Temp 98.9°F | Resp 18 | Ht 64.0 in | Wt 223.0 lb

## 2021-09-04 DIAGNOSIS — D62 Acute posthemorrhagic anemia: Secondary | ICD-10-CM | POA: Diagnosis not present

## 2021-09-04 DIAGNOSIS — K2100 Gastro-esophageal reflux disease with esophagitis, without bleeding: Secondary | ICD-10-CM

## 2021-09-04 DIAGNOSIS — E611 Iron deficiency: Secondary | ICD-10-CM | POA: Diagnosis not present

## 2021-09-04 DIAGNOSIS — R300 Dysuria: Secondary | ICD-10-CM | POA: Insufficient documentation

## 2021-09-04 LAB — URINALYSIS, ROUTINE W REFLEX MICROSCOPIC
Bilirubin Urine: NEGATIVE
Hgb urine dipstick: NEGATIVE
Ketones, ur: NEGATIVE
Nitrite: NEGATIVE
RBC / HPF: NONE SEEN (ref 0–?)
Specific Gravity, Urine: 1.01 (ref 1.000–1.030)
Total Protein, Urine: NEGATIVE
Urine Glucose: NEGATIVE
Urobilinogen, UA: 0.2 (ref 0.0–1.0)
pH: 6.5 (ref 5.0–8.0)

## 2021-09-04 NOTE — Patient Instructions (Addendum)
In 3-4 weeks come back for labs.  You can take the B12 1000 mcg daily.  We will check the urine.

## 2021-09-04 NOTE — Assessment & Plan Note (Signed)
Checking U/A and culture today. Treat as appropriate.

## 2021-09-04 NOTE — Progress Notes (Signed)
   Subjective:   Patient ID: Gabrielle [name], female    DOB: 09/06/1951, 70 y.o.   MRN: [id number]  HPI The patient is a 70 YO female coming in for ER follow up. Labs were overall stable at ER with still low iron levels. Some GI intolerance to oral iron. We had ordered future labs at our last virtual visit which she did not get done. Was feeling tired and went to ER. Some mild right pelvic pain and U/A at ER with leukocytes no treatment given.  Review of Systems  Constitutional:  Positive for appetite change and fatigue.  HENT: Negative.    Eyes: Negative.   Respiratory:  Negative for cough, chest tightness and shortness of breath.   Cardiovascular:  Negative for chest pain, palpitations and leg swelling.  Gastrointestinal:  Negative for abdominal distention, abdominal pain, constipation, diarrhea, nausea and vomiting.  Musculoskeletal: Negative.   Skin: Negative.   Neurological: Negative.   Psychiatric/Behavioral: Negative.     Objective:  Physical Exam Constitutional:      Appearance: She is well-developed. She is obese.  HENT:     Head: Normocephalic and atraumatic.  Cardiovascular:     Rate and Rhythm: Normal rate and regular rhythm.  Pulmonary:     Effort: Pulmonary effort is normal. No respiratory distress.     Breath sounds: Normal breath sounds. No wheezing or rales.  Abdominal:     General: Bowel sounds are normal. There is no distension.     Palpations: Abdomen is soft.     Tenderness: There is no abdominal tenderness. There is no rebound.  Musculoskeletal:     Cervical back: Normal range of motion.  Skin:    General: Skin is warm and dry.  Neurological:     Mental Status: She is alert and oriented to person, place, and time.     Coordination: Coordination normal.    Vitals:   09/04/21 1048  BP: 130/80  Pulse: 74  Resp: 18  Temp: 98.9 F (37.2 C)  TempSrc: Oral  SpO2: 97%  Weight: 223 lb (101.2 kg)  Height: '5\' 4"'$  (1.626 m)    This visit occurred during  the SARS-CoV-2 public health emergency.  Safety protocols were in place, including screening questions prior to the visit, additional usage of staff PPE, and extensive cleaning of exam room while observing appropriate contact time as indicated for disinfecting solutions.   Assessment & Plan:

## 2021-09-04 NOTE — Assessment & Plan Note (Signed)
Ferritin 4 is diagnostic and we talked about replacement of iron stores after serious GI bleeding can take 3-6 months. Did discuss risk and benefits of IV iron and she elects not to pursue that for now. Having some tolerability issues with oral iron so since Hg stable will have her try every other day iron and monitor closely in 3-4 weeks with CBC and ferritin.

## 2021-09-04 NOTE — Assessment & Plan Note (Signed)
Taking nexium 40 mg BID due to recent GI bleeding upper. She had polyp clipped at the time. She will follow up with GI in 6 months or sooner for recurrent bleeding signs.

## 2021-09-04 NOTE — Assessment & Plan Note (Signed)
Recent labs at the ER with mild improvement to stable Hg level 9.3. She is taking oral iron and we did discuss IV iron. She is unsure about this given her multiple bad reactions with medications in the past. She does not want to pursue this today. Advised to try iron every other day to help with tolerability. Ferritin 4 shows iron deficiency still. No active bleeding clinically. Continue with iron. Needs close monitoring. Labs are in for 3-4 weeks to monitor Hg and iron levels.

## 2021-09-05 LAB — URINE CULTURE

## 2021-09-07 ENCOUNTER — Telehealth: Payer: Self-pay | Admitting: Internal Medicine

## 2021-09-07 NOTE — Telephone Encounter (Signed)
See below

## 2021-09-07 NOTE — Telephone Encounter (Signed)
Patient wants to know if its okay for her to get the flu shot currently since she is an anemic or if she needs to wait until her blood is stable   Please call patient & advise [phone]

## 2021-09-07 NOTE — Telephone Encounter (Signed)
Ok to get flu shot

## 2021-09-08 DIAGNOSIS — I10 Essential (primary) hypertension: Secondary | ICD-10-CM | POA: Diagnosis not present

## 2021-09-08 DIAGNOSIS — H9319 Tinnitus, unspecified ear: Secondary | ICD-10-CM | POA: Diagnosis not present

## 2021-09-08 DIAGNOSIS — R8281 Pyuria: Secondary | ICD-10-CM | POA: Diagnosis not present

## 2021-09-08 DIAGNOSIS — E669 Obesity, unspecified: Secondary | ICD-10-CM | POA: Diagnosis not present

## 2021-09-08 NOTE — Telephone Encounter (Signed)
See my chart message

## 2021-09-12 ENCOUNTER — Telehealth: Payer: Self-pay | Admitting: Gastroenterology

## 2021-09-12 NOTE — Telephone Encounter (Signed)
The patient called this morning the answering service.  [facility] is covering for Dr. Mann/Dr. Benson Norway.  Patient history reviewed she had a recent gastric polyp that was resected in August by Dr. Collene Mares after presenting with anemia.  The patient was discharged on oral iron and PPI therapy.  Patient over the course of the last 2 days has not had any bowel movements.  She is having progressive abdominal discomfort.  She is taking MiraLAX 2 capfuls daily x2 days last dose yesterday evening without much effect.  She calls with concern for neck steps in etiology and is whether she could have a bowel obstruction.  Patient does not have constipation on normal regular basis.  She denies any fevers.  Abdominal discomfort is located within the mid abdomen and lower abdomen but is not on radiating.  Concern at this point as the patient has significant constipation as result of her oral iron therapy.  I have asked the patient to stop her oral iron for now.  I have asked the patient to initiate Dulcolax 10 mg daily.  She is hesitant about this as a result of cramping that she had previously.  If she does not use Dulcolax she can use magnesium citrate or milk of magnesia.  If by this afternoon she has not had a bowel movement she will trial a glycerin suppository.    If by this evening she has not had a bowel movement she should trial an enema.  If it anytime the patient has progressive worsening of pain or starts developing nausea and vomiting then she can come into the emergency department for further evaluation.  I will forward this message to Dr. Collene Mares and Dr. Benson Norway for follow-up.   Justice Britain, MD [facility] Office # [phone]

## 2021-09-25 ENCOUNTER — Other Ambulatory Visit: Payer: Non-veteran care

## 2021-09-28 ENCOUNTER — Other Ambulatory Visit: Payer: Self-pay

## 2021-09-28 ENCOUNTER — Other Ambulatory Visit (INDEPENDENT_AMBULATORY_CARE_PROVIDER_SITE_OTHER): Payer: PPO

## 2021-09-28 ENCOUNTER — Encounter: Payer: Self-pay | Admitting: Internal Medicine

## 2021-09-28 DIAGNOSIS — D62 Acute posthemorrhagic anemia: Secondary | ICD-10-CM | POA: Diagnosis not present

## 2021-09-28 LAB — COMPREHENSIVE METABOLIC PANEL
ALT: 10 U/L (ref 0–35)
AST: 16 U/L (ref 0–37)
Albumin: 3.6 g/dL (ref 3.5–5.2)
Alkaline Phosphatase: 74 U/L (ref 39–117)
BUN: 15 mg/dL (ref 6–23)
CO2: 27 mEq/L (ref 19–32)
Calcium: 9.1 mg/dL (ref 8.4–10.5)
Chloride: 107 mEq/L (ref 96–112)
Creatinine, Ser: 1.01 mg/dL (ref 0.40–1.20)
GFR: 56.51 mL/min — ABNORMAL LOW (ref >60.00)
Glucose, Bld: 82 mg/dL (ref 70–99)
Potassium: 3.9 mEq/L (ref 3.5–5.1)
Sodium: 142 mEq/L (ref 135–145)
Total Bilirubin: 0.5 mg/dL (ref 0.2–1.2)
Total Protein: 6.6 g/dL (ref 6.0–8.3)

## 2021-09-28 LAB — CBC
HCT: 30.7 % — ABNORMAL LOW (ref 36.0–46.0)
Hemoglobin: 9.6 g/dL — ABNORMAL LOW (ref 12.0–15.0)
MCHC: 31.4 g/dL (ref 30.0–36.0)
MCV: 74.7 fl — ABNORMAL LOW (ref 78.0–100.0)
Platelets: 154 10*3/uL (ref 150.0–400.0)
RBC: 4.11 Mil/uL (ref 3.87–5.11)
RDW: 20.7 % — ABNORMAL HIGH (ref 11.5–15.5)
WBC: 4.7 10*3/uL (ref 4.0–10.5)

## 2021-09-28 LAB — FERRITIN: Ferritin: 7.2 ng/mL — ABNORMAL LOW (ref 10.0–291.0)

## 2021-09-30 NOTE — Telephone Encounter (Signed)
Pt said she missed a call from office, but they didn't leave a message. Gabrielle [name] it must be from the [employer]

## 2021-10-01 ENCOUNTER — Ambulatory Visit: Payer: Non-veteran care

## 2021-10-03 ENCOUNTER — Other Ambulatory Visit: Payer: Self-pay | Admitting: Internal Medicine

## 2021-10-03 DIAGNOSIS — F5104 Psychophysiologic insomnia: Secondary | ICD-10-CM

## 2021-10-09 ENCOUNTER — Ambulatory Visit (INDEPENDENT_AMBULATORY_CARE_PROVIDER_SITE_OTHER): Payer: PPO

## 2021-10-09 ENCOUNTER — Other Ambulatory Visit: Payer: Self-pay

## 2021-10-09 DIAGNOSIS — Z23 Encounter for immunization: Secondary | ICD-10-CM | POA: Diagnosis not present

## 2021-10-09 NOTE — Progress Notes (Signed)
Pt given reg flu vacc w/o any complications. 

## 2021-10-10 ENCOUNTER — Ambulatory Visit: Payer: Non-veteran care

## 2021-10-12 ENCOUNTER — Encounter: Payer: Self-pay | Admitting: Internal Medicine

## 2021-10-12 ENCOUNTER — Encounter (INDEPENDENT_AMBULATORY_CARE_PROVIDER_SITE_OTHER): Payer: Self-pay | Admitting: Ophthalmology

## 2021-10-12 ENCOUNTER — Telehealth (INDEPENDENT_AMBULATORY_CARE_PROVIDER_SITE_OTHER): Payer: Self-pay

## 2021-10-12 ENCOUNTER — Other Ambulatory Visit: Payer: Self-pay

## 2021-10-12 ENCOUNTER — Ambulatory Visit (INDEPENDENT_AMBULATORY_CARE_PROVIDER_SITE_OTHER): Payer: PPO | Admitting: Ophthalmology

## 2021-10-12 DIAGNOSIS — H43813 Vitreous degeneration, bilateral: Secondary | ICD-10-CM | POA: Diagnosis not present

## 2021-10-12 DIAGNOSIS — H4720 Unspecified optic atrophy: Secondary | ICD-10-CM

## 2021-10-12 DIAGNOSIS — H3561 Retinal hemorrhage, right eye: Secondary | ICD-10-CM | POA: Diagnosis not present

## 2021-10-12 DIAGNOSIS — R0683 Snoring: Secondary | ICD-10-CM

## 2021-10-12 NOTE — Progress Notes (Signed)
10/12/2021     CHIEF COMPLAINT Patient presents for  Chief Complaint  Patient presents with   Retina Evaluation      HISTORY OF PRESENT ILLNESS: Gabrielle [name] is a 70 y.o. female who presents to the clinic today for:   HPI     Retina Evaluation   In right eye.  This started months ago.  Associated Symptoms Floaters (Right eye.) and Photophobia (Pt states she has always been light sensitive.).  Negative for Flashes, Distortion, Blind Spot and Pain.  Treatments tried include no treatments (Pt had cataract surgery December 2019.).        Comments   FU dx wool OD- saw Dr. Katy [name] 08/19/2021.  Pt is okay with dilation today after speaking to patient about it. Pt states "when I saw Dr Gabrielle [name] he stated he saw small wool spots from the GI bleed (I had a GI bleed end of July 2022) I had, he said it should go away in 6-12 weeks. He said he will see me in February and will dilate me in February. He did not refer me to Dr Gabrielle [name], I decided to come to Dr Gabrielle [name] for my peace of mind. I did not notice anything in my vision, he just saw it at my 6 months follow up. My right eye is blurred."        Last edited by [name] on 10/12/2021  9:35 AM.      Referring physician: [name], MD [place],  [place] [address]  HISTORICAL INFORMATION:   Selected notes from the MEDICAL RECORD NUMBER    Lab Results  Component Value Date   HGBA1C 5.2 07/28/2021     CURRENT MEDICATIONS: Current Outpatient Medications (Ophthalmic Drugs)  Medication Sig   carboxymethylcellulose (REFRESH PLUS) 0.5 % SOLN Place 1 drop into both eyes 3 (three) times daily as needed (dry eyes).   No current facility-administered medications for this visit. (Ophthalmic Drugs)   Current Outpatient Medications (Other)  Medication Sig   zolpidem (AMBIEN) 10 MG tablet Take 1 tablet (10 mg total) by mouth at bedtime as needed for sleep.   ALPRAZolam (XANAX) 1 MG tablet TAKE 1 TABLET BY  MOUTH 3 TIMES DAILY. (Patient taking differently: Take 1 mg by mouth 3 (three) times daily as needed for anxiety.)   esomeprazole (NEXIUM) 40 MG capsule TAKE 1 CAPSULE (40 MG TOTAL) BY MOUTH 2 (TWO) TIMES DAILY BEFORE A MEAL. (Patient taking differently: Take 40 mg by mouth daily.)   Ferrous Sulfate 140 (45 Fe) MG TBCR Take 140 mg by mouth daily.   Multiple Vitamin (MULTIVITAMIN WITH MINERALS) TABS tablet Take 1 tablet by mouth daily.   ondansetron (ZOFRAN ODT) 4 MG disintegrating tablet 4mg  ODT q4 hours prn nausea/vomit   VITAMIN D PO Take 1 tablet by mouth daily.   No current facility-administered medications for this visit. (Other)      REVIEW OF SYSTEMS: ROS   Positive for: Constitutional Negative for: Gastrointestinal, Neurological, Skin, Genitourinary, Musculoskeletal, HENT, Endocrine, Cardiovascular, Eyes, Respiratory, Psychiatric, Allergic/Imm, Heme/Lymph Last edited by [name], MD on 10/12/2021 10:40 AM.       ALLERGIES Allergies  Allergen Reactions   Iodinated Diagnostic Agents Hives   Other Hives, Other (See Comments) and Rash   Shellfish Allergy Hives, Other (See Comments) and Rash   Gadolinium Derivatives Hives   Hydrocodone Other (See Comments)    insomnia   Metronidazole Nausea And Vomiting and Other (See Comments)  Acetazolamide Anxiety    Makes head feel strange   Gabapentin Anxiety    unknown   Mirtazapine Other (See Comments)    Dry eyes    PAST MEDICAL HISTORY Past Medical History:  Diagnosis Date   Anemia    as a child   Anxiety    takes Xanax daily   Arthritis    Cataracts, bilateral    immature   Chronic insomnia 69/62/9528   Complication of anesthesia    Constipation    will occasionally take Milk of Mag   Diverticulosis    Dizziness    rarely   GERD (gastroesophageal reflux disease)    takes Omeprazole every other day   Glaucoma    borderline and no drops required   Hearing loss    but doesn't have hearing aids   History  of bronchitis    many many yrs ago   History of colitis    History of colon polyps    Hypertension    hasn't been on meds for the past 18yrs    Insomnia    takes Trazodone nightly as needed and Ambien nightly    Insomnia due to substance 10/11/2014   Joint pain    Joint swelling    PONV (postoperative nausea and vomiting)    Stroke (HCC)    Tinnitus    takes HCTZ daily to decrease pressure in ears   Past Surgical History:  Procedure Laterality Date   ABDOMINAL HYSTERECTOMY     APPENDECTOMY     COLONOSCOPY     ESOPHAGOGASTRODUODENOSCOPY (EGD) WITH PROPOFOL N/A 07/29/2021   Procedure: ESOPHAGOGASTRODUODENOSCOPY (EGD) WITH PROPOFOL;  Surgeon: Gabrielle Craver, MD;  Location: [facility];  Service: Endoscopy;  Laterality: N/A;   HEMOSTASIS CLIP PLACEMENT  07/29/2021   Procedure: HEMOSTASIS CLIP PLACEMENT;  Surgeon: Gabrielle Craver, MD;  Location: [facility];  Service: Endoscopy;;   IR RADIOLOGIST EVAL & MGMT  06/08/2017   POLYPECTOMY  07/29/2021   Procedure: POLYPECTOMY;  Surgeon: Gabrielle Craver, MD;  Location: [facility];  Service: Endoscopy;;   tinnitus Right    TONSILLECTOMY     TOTAL KNEE ARTHROPLASTY Right 03/13/2014   DR MURPHY   TOTAL KNEE ARTHROPLASTY Right 03/13/2014   Procedure: TOTAL KNEE ARTHROPLASTY;  Surgeon: Gabrielle Lights, MD;  Location: [facility];  Service: Orthopedics;  Laterality: Right;   TUBAL LIGATION      FAMILY HISTORY Family History  Problem Relation Age of Onset   Depression Mother    Cancer - Other Father    Kidney disease Father    Cancer - Other Sister        breast ca   Cancer - Other Brother        lymphoma in remission   Lymphoma Brother    Cancer - Other Sister        in remission breast ca    SOCIAL HISTORY Social History   Tobacco Use   Smoking status: Former    Packs/day: 1.00    Years: 22.00    Pack years: 22.00    Types: Cigarettes    Start date: 03/08/1972    Quit date: 01/08/1994    Years since quitting: 27.7   Smokeless tobacco: Never    Tobacco comments:    quit smoking 44yrs ago  Vaping Use   Vaping Use: Never used  Substance Use Topics   Alcohol use: No    Alcohol/week: 0.0 standard drinks   Drug use: No  OPHTHALMIC EXAM:  Base Eye Exam     Visual Acuity (ETDRS)       Right Left   Dist [place] 20/20 20/200   Dist ph [place]  NI         Tonometry (Tonopen, 9:33 AM)       Right Left   Pressure 15 16         Pupils       Pupils Dark Light Shape React APD   Right PERRL 7 5 Round Brisk None   Left PERRL 7 5 Round Brisk +1         Visual Fields (Counting fingers)       Left Right     Full   Restrictions Total superior temporal, superior nasal deficiencies          Extraocular Movement       Right Left    Full Full         Neuro/Psych     Oriented x3: Yes   Mood/Affect: Normal         Dilation     Both eyes: 1.0% Mydriacyl, 2.5% Phenylephrine @ 9:33 AM           Slit Lamp and Fundus Exam     External Exam       Right Left   External Normal Normal         Slit Lamp Exam       Right Left   Lids/Lashes Normal Normal   Conjunctiva/Sclera White and quiet White and quiet   Cornea Clear Clear   Anterior Chamber Deep and quiet Deep and quiet   Iris Round and reactive Round and reactive   Lens Centered posterior chamber intraocular lens, Open posterior capsule Centered posterior chamber intraocular lens, Clear PC   Anterior Vitreous Normal Normal         Fundus Exam       Right Left   Posterior Vitreous Posterior vitreous detachment, , Central vitreous floaters Normal   Disc Normal 3+ Pallor   C/D Ratio 0.55 0.8   Macula Normal Normal   Vessels Normal, solitary cotton-wool spot and solitary retinal hemorrhage inferior to the optic nerve.  This could be episodic hypertensive related,  As there are AV crossing changes noted.   Normal   Periphery Normal,,  no holes or tears Normal            IMAGING AND PROCEDURES  Imaging and Procedures for  10/12/21  OCT, Retina - OU - Both Eyes       Right Eye Quality was good. Scan locations included subfoveal. Central Foveal Thickness: 239. Progression has been stable. Findings include normal foveal contour.   Left Eye Quality was good. Scan locations included subfoveal. Central Foveal Thickness: 270. Progression has been stable.   Notes OD normal contour, incidental posterior vitreous detachment, OS with diffuse retinal atrophy from previous AI ON stable     Color Fundus Photography Optos - OU - Both Eyes       Right Eye Progression has no prior data. Disc findings include normal observations. Macula : normal observations. Vessels : normal observations. Periphery : normal observations.   Left Eye Progression has no prior data. Disc findings include increased cup to disc ratio, pallor. Vessels : attenuated. Periphery : normal observations.   Notes OD with incidental posterior vitreous detachment. Incidental retinal hemorrhage inferior to the optic nerve OD.  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 Pricilla Holm.  I have encouraged her to discover whether or not sleep apnea may be present as nightly episodic hypoxia and hypertension during episodes of sleep apnea can cause the types of findings in her right eye  I have also explained to her that untreated sleep apnea can trigger ischemic disease of the retina including AI ON which afflicted the left eye causing profound vision loss in her past.  Optic nerve atrophy Prior AI ON OS stable over time     ICD-10-CM   1. Retinal hemorrhage, right eye  H35.61 OCT, Retina - OU - Both Eyes    Color Fundus Photography Optos - OU - Both Eyes    2.  Posterior vitreous detachment of both eyes  H43.813 OCT, Retina - OU - Both Eyes    Color Fundus Photography Optos - OU - Both Eyes    3. Snores  R06.83     4. Optic nerve atrophy  H47.20       1.  OD with minor retinal hemorrhage, which is an incidental finding typically found in patients with either underlying hypertension or diabetes mellitus but occasionally can be sporadic.    However they can also be seen in episodic hypertension sometimes seen in folks with untreated sleep apnea with the nightly hypoxic and hypotensive episodic events that occurred in these conditions  2.  Positive review of systems for sleep apnea by my questioning.  I do recommend patient have a formal sleep study and I encouraged her to try this at home with sleep study, and understand that she will awaken 2 and 3 times nightly as she typically does even while using Ambien.  I also explained that the importance of using normal medications that day prior to her sleep study including the use of Ambien at night so that we can make an accurate determination as to whether or not she is having significant hypoxic events and/or hypertensive events that require intervention  3.  Ophthalmic Meds Ordered this visit:  No orders of the defined types were placed in this encounter.      Return in about 6 weeks (around 11/23/2021) for DILATE OU, COLOR FP, OCT.  There are no Patient Instructions on file for this visit.   Explained the diagnoses, plan, and follow up with the patient and they expressed understanding.  Patient expressed understanding of the importance of proper follow up care.   Clent Demark Shayley Medlin M.D. Diseases & Surgery of the Retina and Vitreous [facility]ina [facility] [place] 10/12/21     Abbreviations: M myopia (nearsighted); A astigmatism; H hyperopia (farsighted); P presbyopia; Mrx spectacle prescription;  CTL contact lenses; OD right eye; OS left eye; OU both eyes  XT exotropia; ET esotropia; PEK  punctate epithelial keratitis; PEE punctate epithelial erosions; DES dry eye syndrome; MGD meibomian gland dysfunction; ATs artificial tears; PFAT's preservative free artificial tears; [place] nuclear sclerotic cataract; PSC posterior subcapsular cataract; ERM epi-retinal membrane; PVD posterior vitreous detachment; RD retinal detachment; DM diabetes mellitus; DR diabetic retinopathy; NPDR non-proliferative diabetic retinopathy; PDR proliferative diabetic retinopathy; CSME clinically significant macular edema; DME diabetic macular edema; dbh dot blot hemorrhages; CWS cotton wool spot; POAG primary open angle glaucoma; C/D cup-to-disc ratio; HVF humphrey  visual field; GVF goldmann visual field; OCT optical coherence tomography; IOP intraocular pressure; BRVO Branch retinal vein occlusion; CRVO central retinal vein occlusion; CRAO central retinal artery occlusion; BRAO branch retinal artery occlusion; RT retinal tear; SB scleral buckle; PPV pars plana vitrectomy; VH Vitreous hemorrhage; PRP panretinal laser photocoagulation; IVK intravitreal kenalog; VMT vitreomacular traction; MH Macular hole;  NVD neovascularization of the disc; NVE neovascularization elsewhere; AREDS age related eye disease study; ARMD age related macular degeneration; POAG primary open angle glaucoma; EBMD epithelial/anterior basement membrane dystrophy; ACIOL anterior chamber intraocular lens; IOL intraocular lens; PCIOL posterior chamber intraocular lens; Phaco/IOL phacoemulsification with intraocular lens placement; [place] photorefractive keratectomy; LASIK laser assisted in situ keratomileusis; HTN hypertension; DM diabetes mellitus; COPD chronic obstructive pulmonary disease

## 2021-10-12 NOTE — Assessment & Plan Note (Signed)
I strongly urged the patient to seek out home sleep study testing arranged through her PCP Dr. Pricilla Holm.  I have encouraged her to discover whether or not sleep apnea may be present as nightly episodic hypoxia and hypertension during episodes of sleep apnea can cause the types of findings in her right eye  I have also explained to her that untreated sleep apnea can trigger ischemic disease of the retina including AI ON which afflicted the left eye causing profound vision loss in her past.

## 2021-10-12 NOTE — Telephone Encounter (Signed)
error 

## 2021-10-12 NOTE — Assessment & Plan Note (Signed)
Prior AI ON OS stable over time

## 2021-10-12 NOTE — Telephone Encounter (Signed)
I called patient back in regards to her voicemail with questions for Dr [name] about her visit this morning. Patient stated "I have high anxiety and PTSD, I was afraid to ask him questions and I do not understand what he was saying I have. I was confused. What is Optic Nerve Atrophy? Am I going to lose my vision in the next 5 weeks between now and when I see him again? I didn't hear anything about the cotton wool in my eye even though that is what Dr [name] said to me." Pt stated she has scheduled her sleep apnea test for Friday. Pt is concerned about what these diagnosis are for her eyes. Pt states again she does not have HBP or Diabetes, I informed pt Dr [name] is going to wait on her sleep apnea results and following up with her in 5 weeks. Pt still is confused and not understanding, I explained he cannot give more information about her eyes until he gets the information about the sleep apnea test. Informed patient she is not losing her vision and will be seen in 5 weeks.

## 2021-10-13 ENCOUNTER — Telehealth: Payer: Self-pay | Admitting: Internal Medicine

## 2021-10-13 NOTE — Telephone Encounter (Signed)
May from [facility] called to check on the status of the clearance form. Check with [place] to get a update on paperwork. Stated the paperwork had be completed and faxed this morning. May asked if we could refax that to the fax # listed below.

## 2021-10-13 NOTE — Telephone Encounter (Signed)
Gabrielle [name] from [facility] has called and states she sent over a medical clearance form on last Wednesday or Thursday, and hasn't heard anything back. States that  the clearance is for pt. To get a filling, after having 5 blood transfusions. The surgery is scheduled for Thursday, 10.20.22. States that she would like a call back with an update ASAP.     Callback #- [phone] Fax #- [phone]

## 2021-10-13 NOTE — Telephone Encounter (Signed)
Form was re-faxed twice and both times it went thru successful. Confirmation faxed has been received both times.

## 2021-10-15 ENCOUNTER — Telehealth: Payer: Self-pay

## 2021-10-15 DIAGNOSIS — H43813 Vitreous degeneration, bilateral: Secondary | ICD-10-CM | POA: Diagnosis not present

## 2021-10-15 DIAGNOSIS — H0288B Meibomian gland dysfunction left eye, upper and lower eyelids: Secondary | ICD-10-CM | POA: Diagnosis not present

## 2021-10-15 DIAGNOSIS — H40053 Ocular hypertension, bilateral: Secondary | ICD-10-CM | POA: Diagnosis not present

## 2021-10-15 DIAGNOSIS — D3132 Benign neoplasm of left choroid: Secondary | ICD-10-CM | POA: Diagnosis not present

## 2021-10-15 DIAGNOSIS — H0288A Meibomian gland dysfunction right eye, upper and lower eyelids: Secondary | ICD-10-CM | POA: Diagnosis not present

## 2021-10-15 DIAGNOSIS — H11823 Conjunctivochalasis, bilateral: Secondary | ICD-10-CM | POA: Diagnosis not present

## 2021-10-15 DIAGNOSIS — Z961 Presence of intraocular lens: Secondary | ICD-10-CM | POA: Diagnosis not present

## 2021-10-15 DIAGNOSIS — H16223 Keratoconjunctivitis sicca, not specified as Sjogren's, bilateral: Secondary | ICD-10-CM | POA: Diagnosis not present

## 2021-10-15 DIAGNOSIS — Z9889 Other specified postprocedural states: Secondary | ICD-10-CM | POA: Diagnosis not present

## 2021-10-15 DIAGNOSIS — H02822 Cysts of right lower eyelid: Secondary | ICD-10-CM | POA: Diagnosis not present

## 2021-10-15 DIAGNOSIS — H47012 Ischemic optic neuropathy, left eye: Secondary | ICD-10-CM | POA: Diagnosis not present

## 2021-10-15 NOTE — Progress Notes (Signed)
    Chronic Care Management Pharmacy Assistant   Name: Gabrielle [name]  MRN: [id number] DOB: 03/24/1951   Reason for Encounter: Disease State   Conditions to be addressed/monitored: General    Recent office visits:  09/04/21 [name], MD-PCP (Dysuria) med change:Ferrous Sulfate 140 (45 Fe) MG TBCR  Recent consult visits:  10/12/21 [place], Clent Demark, MD-ophthamology (Retinal hemorrhage, right eye) no med changes  Hospital visits:  Medication Reconciliation was completed by comparing discharge summary, patient's EMR and Pharmacy list, and upon discussion with patient.  Admitted to the hospital on 09/01/21 due to weakness. Discharge date was 09/01/21. Discharged from [facility].  Medications that remain the same after Hospital Discharge:??  -All other medications will remain the same.    Admitted to the hospital on 08/13/21 due to weakness. Discharge date was 08/13/21. Discharged from [facility]- ED  Med changes: ferrous sulfate 325 (65 FE) MG tablet ondansetron (ZOFRAN ODT) 4 MG disintegrating tablet  Medications that remain the same after Hospital Discharge:??  -All other medications will remain the same.   Admitted to the hospital on 07/28/21 due to gastrointestinal hemorrhage with melena. Discharge date was 08/01/21. Discharged from [facility].  Medications: Outpatient Encounter Medications as of 10/15/2021  Medication Sig Note   ALPRAZolam (XANAX) 1 MG tablet TAKE 1 TABLET BY MOUTH 3 TIMES DAILY. (Patient taking differently: Take 1 mg by mouth 3 (three) times daily as needed for anxiety.)    carboxymethylcellulose (REFRESH PLUS) 0.5 % SOLN Place 1 drop into both eyes 3 (three) times daily as needed (dry eyes).    esomeprazole (NEXIUM) 40 MG capsule TAKE 1 CAPSULE (40 MG TOTAL) BY MOUTH 2 (TWO) TIMES DAILY BEFORE A MEAL. (Patient taking differently: Take 40 mg by mouth daily.)    Ferrous Sulfate 140 (45 Fe) MG TBCR Take  140 mg by mouth daily. 09/04/2021: Hasn't received from the [place].    Multiple Vitamin (MULTIVITAMIN WITH MINERALS) TABS tablet Take 1 tablet by mouth daily.    ondansetron (ZOFRAN ODT) 4 MG disintegrating tablet 4mg  ODT q4 hours prn nausea/vomit    VITAMIN D PO Take 1 tablet by mouth daily.    zolpidem (AMBIEN) 10 MG tablet Take 1 tablet (10 mg total) by mouth at bedtime as needed for sleep.    No facility-administered encounter medications on file as of 10/15/2021.   Have you had any problems recently with your health?Patient states that she does not have any new health issues. Still having issue with her eye and the ringing in her head  Have you had any problems with your pharmacy?Patient states that she does not have any problems with getting medications from the pharmacy  What issues or side effects are you having with your medications? Patient states she has no side effects from medications  What would you like me to pass along to [facility] for them to help you with? Patient did state that the iron she is on bothers her stomach even if she takes with apple sauce. She wants to know if there is anything else she can do for this not to be a problem.   What can we do to take care of you better? Patient states no, not at this time  Care Gaps: Colonoscopy-10/30/18 Diabetic Foot Exam-NA Mammogram-05/04/21 Ophthalmology-NA Dexa Scan - NA Annual Well Visit - NA Micro albumin-NA Hemoglobin A1c- 07/28/21  Star Rating Drugs: None ID  Ethelene Hal Clinical Pharmacist Assistant [phone]

## 2021-10-16 ENCOUNTER — Ambulatory Visit: Payer: Non-veteran care | Admitting: Internal Medicine

## 2021-10-20 ENCOUNTER — Encounter: Payer: Self-pay | Admitting: Internal Medicine

## 2021-10-20 ENCOUNTER — Ambulatory Visit (INDEPENDENT_AMBULATORY_CARE_PROVIDER_SITE_OTHER): Payer: PPO | Admitting: Internal Medicine

## 2021-10-20 ENCOUNTER — Other Ambulatory Visit: Payer: Self-pay

## 2021-10-20 VITALS — BP 124/90 | HR 70 | Resp 18 | Ht 64.0 in | Wt 221.4 lb

## 2021-10-20 DIAGNOSIS — H3560 Retinal hemorrhage, unspecified eye: Secondary | ICD-10-CM | POA: Diagnosis not present

## 2021-10-20 DIAGNOSIS — H3561 Retinal hemorrhage, right eye: Secondary | ICD-10-CM

## 2021-10-20 NOTE — Patient Instructions (Addendum)
We will get the home sleep test done to check for sleep apnea.  March 14, 2018 was the first time I saw you.

## 2021-10-20 NOTE — Progress Notes (Signed)
   Subjective:   Patient ID: Gabrielle [name], female    DOB: 1951-02-20, 69 y.o.   MRN: [id number]  HPI The patient is a 70 YO female coming in for needing home sleep test. Eye specialist saw some findings which indicate need for screening for this. Sleeping poorly except with her Lorrin Mais which is usual for her. No change to this or sleepiness during daytime. Other concerns addressed as well.   Review of Systems  Constitutional: Negative.   HENT: Negative.    Eyes:  Positive for visual disturbance.  Respiratory:  Negative for cough, chest tightness and shortness of breath.   Cardiovascular:  Negative for chest pain, palpitations and leg swelling.  Gastrointestinal:  Negative for abdominal distention, abdominal pain, constipation, diarrhea, nausea and vomiting.  Musculoskeletal:  Positive for arthralgias.  Skin: Negative.   Neurological: Negative.   Psychiatric/Behavioral:  Positive for sleep disturbance.    Objective:  Physical Exam Constitutional:      Appearance: She is well-developed.  HENT:     Head: Normocephalic and atraumatic.  Cardiovascular:     Rate and Rhythm: Normal rate and regular rhythm.  Pulmonary:     Effort: Pulmonary effort is normal. No respiratory distress.     Breath sounds: Normal breath sounds. No wheezing or rales.  Abdominal:     General: Bowel sounds are normal. There is no distension.     Palpations: Abdomen is soft.     Tenderness: There is no abdominal tenderness. There is no rebound.  Musculoskeletal:     Cervical back: Normal range of motion.  Skin:    General: Skin is warm and dry.  Neurological:     Mental Status: She is alert and oriented to person, place, and time.     Coordination: Coordination normal.    Vitals:   10/20/21 1027  BP: 124/90  Pulse: 70  Resp: 18  SpO2: 98%  Weight: 221 lb 6.4 oz (100.4 kg)  Height: 5\' 4"  (1.626 m)   This visit occurred during the SARS-CoV-2 public health emergency.  Safety protocols were in place,  including screening questions prior to the visit, additional usage of staff PPE, and extensive cleaning of exam room while observing appropriate contact time as indicated for disinfecting solutions.   Assessment & Plan:  Visit time 20 minutes in face to face communication with patient and coordination of care, additional 10 minutes spent in record review, coordination or care, ordering tests, communicating/referring to other healthcare professionals, documenting in medical records all on the same day of the visit for total time 30 minutes spent on the visit.

## 2021-10-21 ENCOUNTER — Encounter: Payer: Self-pay | Admitting: Internal Medicine

## 2021-10-21 NOTE — Assessment & Plan Note (Signed)
Weight is stable. BMI 38 and complicated by neck pain and GERD. She will undergo screening for OSA due to some eye findings. She does not have known snoring or current sleep partner to check.

## 2021-10-21 NOTE — Assessment & Plan Note (Signed)
Ordered home sleep test to screen for OSA. We did discuss multiple options for treatment if detected as she does not feel she would tolerate CPAP well.

## 2021-10-23 ENCOUNTER — Telehealth: Payer: Self-pay | Admitting: Internal Medicine

## 2021-10-23 NOTE — Telephone Encounter (Signed)
Patient calling in  Provider placed order for sleep study test during recent visit  Patient says provider told her she would get a call for her to pick up sleep study equipment but no one has contacted her  Please call patient w/ necessary info [phone]

## 2021-10-27 NOTE — Telephone Encounter (Signed)
Pt. Has called back and is requesting an update on sleep study equipment. States she has an appt. Soon, and she hasn't heard anything .      Callback #- [phone]

## 2021-10-27 NOTE — Telephone Encounter (Signed)
Can they patient have a update?

## 2021-10-28 NOTE — Telephone Encounter (Signed)
Patient calling to check status of sleep study  Patient's call was disconnected before I could inform her on the status

## 2021-10-28 NOTE — Telephone Encounter (Signed)
Sent message to [place] in [facility]

## 2021-10-29 NOTE — Telephone Encounter (Signed)
See below

## 2021-10-29 NOTE — Telephone Encounter (Signed)
Patient calling in regarding sleep study referral  [facility] says they are scheduled out about 8-10 weeks & will contact patient when sleep study equipment is ready for pickup  Patient says she can not wait that long & is req provider send new referral to somewhere where a sleep study can be done sooner  Please call patient [phone]

## 2021-10-30 NOTE — Telephone Encounter (Signed)
I'm not aware of wait times but this is likely soonest she could get this done to my knowledge. You could ask referrals if they know wait time.

## 2021-10-30 NOTE — Telephone Encounter (Signed)
See below

## 2021-11-02 ENCOUNTER — Telehealth (INDEPENDENT_AMBULATORY_CARE_PROVIDER_SITE_OTHER): Payer: Self-pay

## 2021-11-02 NOTE — Telephone Encounter (Signed)
Called stating she has questions about sleep study. I called patient back. Patient stated her sleep study couldn't be scheduled sooner because they are 8-10 weeks behind. It is scheduled, but she stated she has appt with Dr [name] 11/09/2021 and wanted to know if she needs to keep that appointment, and also wanted to know if her eyes will be okay.  I spoke to Dr [name]. He informed me to tell patient her eyes will be fine, but he can't get information until she has her sleep study. He wanted me to let patient know to come back on her scheduled appointment to see Dr [name] on the 14th. He wants to see her back to reassure her about her eyes and the sleep study.   I called patient back and she explained she does not want to be dilated at the 11/14 visit. Pt states "I am still getting over the dilation from the last visit." She states she will come in on the 14th to talk more about her eyes and the sleep study because she was unsure what was going on after she left last visit.

## 2021-11-05 ENCOUNTER — Telehealth: Payer: Self-pay | Admitting: Internal Medicine

## 2021-11-05 NOTE — Telephone Encounter (Signed)
Pt. Has called and states she is in need of Dexlansoprazole. States that she is now taking esomeprazole (NEXIUM) 40 MG capsule for esophageal burning. States that this medication seems to make symptoms worse. Requesting a sample of medication until prescription can be sent over. Requesting call back.  Advised pt that requests/ refills may take up to three days.   Callback #- [phone]

## 2021-11-06 ENCOUNTER — Telehealth: Payer: Self-pay | Admitting: Internal Medicine

## 2021-11-06 DIAGNOSIS — G932 Benign intracranial hypertension: Secondary | ICD-10-CM

## 2021-11-06 DIAGNOSIS — H43311 Vitreous membranes and strands, right eye: Secondary | ICD-10-CM

## 2021-11-06 DIAGNOSIS — H4720 Unspecified optic atrophy: Secondary | ICD-10-CM

## 2021-11-06 NOTE — Telephone Encounter (Signed)
Pt. Has called and states she is in need of Dexlansoprazole. States that she is now taking esomeprazole (NEXIUM) 40 MG capsule for esophageal burning. States that this medication seems to make symptoms worse. Requesting a sample of medication until prescription can be sent over. Requesting call back.   Advised pt that requests/ refills may take up to three days.    Callback #- [phone]

## 2021-11-06 NOTE — Telephone Encounter (Signed)
Pt. Called and is requesting two referrals. Referral to Dr. Asencion Partridge Dohmeier (sleep study) and Dr. Sarina Ill (Idiopathic intracranial hypertension)   Would also like a call back about message put in on yesterday.    Please advise.    Callback #- [phone]

## 2021-11-06 NOTE — Telephone Encounter (Signed)
MD is out of the office today. Will hold until she return.Marland KitchenJohny Chess

## 2021-11-06 NOTE — Telephone Encounter (Signed)
Ok for sample if we have this, thanks

## 2021-11-09 ENCOUNTER — Ambulatory Visit (INDEPENDENT_AMBULATORY_CARE_PROVIDER_SITE_OTHER): Payer: PPO | Admitting: Ophthalmology

## 2021-11-09 ENCOUNTER — Encounter (INDEPENDENT_AMBULATORY_CARE_PROVIDER_SITE_OTHER): Payer: Self-pay | Admitting: Ophthalmology

## 2021-11-09 ENCOUNTER — Other Ambulatory Visit: Payer: Self-pay

## 2021-11-09 DIAGNOSIS — R0683 Snoring: Secondary | ICD-10-CM

## 2021-11-09 DIAGNOSIS — H3561 Retinal hemorrhage, right eye: Secondary | ICD-10-CM

## 2021-11-09 DIAGNOSIS — H43311 Vitreous membranes and strands, right eye: Secondary | ICD-10-CM | POA: Diagnosis not present

## 2021-11-09 DIAGNOSIS — H4720 Unspecified optic atrophy: Secondary | ICD-10-CM

## 2021-11-09 DIAGNOSIS — H43813 Vitreous degeneration, bilateral: Secondary | ICD-10-CM

## 2021-11-09 MED ORDER — DEXLANSOPRAZOLE 60 MG PO CPDR: 60.0000 mg | DELAYED_RELEASE_CAPSULE | Freq: Every day | ORAL | 3 refills | Status: DC

## 2021-11-09 NOTE — Assessment & Plan Note (Signed)
Mild not visually impactful

## 2021-11-09 NOTE — Telephone Encounter (Signed)
Sent in Gabrielle [name].

## 2021-11-09 NOTE — Assessment & Plan Note (Signed)
OS stable 

## 2021-11-09 NOTE — Assessment & Plan Note (Signed)
Small dot hemorrhage blot hemorrhage inferotemporal arcade seen 1 month previous completely resolved as expected

## 2021-11-09 NOTE — Progress Notes (Addendum)
11/09/2021     CHIEF COMPLAINT Patient presents for  Chief Complaint  Patient presents with   Retina Follow Up      HISTORY OF PRESENT ILLNESS: Gabrielle [name] is a 70 y.o. female who presents to the clinic today for:   HPI     Retina Follow Up   Patient presents with  Other.  In both eyes.  This started 4 weeks ago.  Duration of 4 weeks.  Since onset it is stable.        Comments   4 week f/u OU with OCT and FP      Last edited by Reather Littler, COA on 11/09/2021 10:51 AM.      Referring physician: [name], MD [place],  [place] [id number]  HISTORICAL INFORMATION:   Selected notes from the MEDICAL RECORD NUMBER    Lab Results  Component Value Date   HGBA1C 5.2 07/28/2021     CURRENT MEDICATIONS: Current Outpatient Medications (Ophthalmic Drugs)  Medication Sig   carboxymethylcellulose (REFRESH PLUS) 0.5 % SOLN Place 1 drop into both eyes 3 (three) times daily as needed (dry eyes).   No current facility-administered medications for this visit. (Ophthalmic Drugs)   Current Outpatient Medications (Other)  Medication Sig   ALPRAZolam (XANAX) 1 MG tablet TAKE 1 TABLET BY MOUTH 3 TIMES DAILY. (Patient taking differently: Take 1 mg by mouth 3 (three) times daily as needed for anxiety.)   dexlansoprazole (DEXILANT) 60 MG capsule Take 1 capsule (60 mg total) by mouth daily.   Ferrous Sulfate 140 (45 Fe) MG TBCR Take 140 mg by mouth daily.   Multiple Vitamin (MULTIVITAMIN WITH MINERALS) TABS tablet Take 1 tablet by mouth daily.   ondansetron (ZOFRAN ODT) 4 MG disintegrating tablet 4mg  ODT q4 hours prn nausea/vomit   VITAMIN D PO Take 1 tablet by mouth daily.   zolpidem (AMBIEN) 10 MG tablet Take 1 tablet (10 mg total) by mouth at bedtime as needed for sleep.   No current facility-administered medications for this visit. (Other)      REVIEW OF SYSTEMS:    ALLERGIES Allergies  Allergen Reactions   Iodinated Diagnostic  Agents Hives   Other Hives, Other (See Comments) and Rash   Shellfish Allergy Hives, Other (See Comments) and Rash   Gadolinium Derivatives Hives   Hydrocodone Other (See Comments)    insomnia   Metronidazole Nausea And Vomiting and Other (See Comments)   Acetazolamide Anxiety    Makes head feel strange   Gabapentin Anxiety    unknown   Mirtazapine Other (See Comments)    Dry eyes    PAST MEDICAL HISTORY Past Medical History:  Diagnosis Date   Anemia    as a child   Anxiety    takes Xanax daily   Arthritis    Cataracts, bilateral    immature   Chronic insomnia 89/38/1017   Complication of anesthesia    Constipation    will occasionally take Milk of Mag   Diverticulosis    Dizziness    rarely   GERD (gastroesophageal reflux disease)    takes Omeprazole every other day   Glaucoma    borderline and no drops required   Hearing loss    but doesn't have hearing aids   History of bronchitis    many many yrs ago   History of colitis    History of colon polyps    Hypertension    hasn't been on meds  for the past 64yrs    Insomnia    takes Trazodone nightly as needed and Ambien nightly    Insomnia due to substance 10/11/2014   Joint pain    Joint swelling    PONV (postoperative nausea and vomiting)    Stroke (HCC)    Tinnitus    takes HCTZ daily to decrease pressure in ears   Past Surgical History:  Procedure Laterality Date   ABDOMINAL HYSTERECTOMY     APPENDECTOMY     COLONOSCOPY     ESOPHAGOGASTRODUODENOSCOPY (EGD) WITH PROPOFOL N/A 07/29/2021   Procedure: ESOPHAGOGASTRODUODENOSCOPY (EGD) WITH PROPOFOL;  Surgeon: [name], MD;  Location: [facility];  Service: Endoscopy;  Laterality: N/A;   HEMOSTASIS CLIP PLACEMENT  07/29/2021   Procedure: HEMOSTASIS CLIP PLACEMENT;  Surgeon: [name], MD;  Location: [facility];  Service: Endoscopy;;   IR RADIOLOGIST EVAL & MGMT  06/08/2017   POLYPECTOMY  07/29/2021   Procedure: POLYPECTOMY;  Surgeon: [name], MD;   Location: [facility];  Service: Endoscopy;;   tinnitus Right    TONSILLECTOMY     TOTAL KNEE ARTHROPLASTY Right 03/13/2014   DR MURPHY   TOTAL KNEE ARTHROPLASTY Right 03/13/2014   Procedure: TOTAL KNEE ARTHROPLASTY;  Surgeon: [name], MD;  Location: [place];  Service: Orthopedics;  Laterality: Right;   TUBAL LIGATION      FAMILY HISTORY Family History  Problem Relation Age of Onset   Depression Mother    Cancer - Other Father    Kidney disease Father    Cancer - Other Sister        breast ca   Cancer - Other Brother        lymphoma in remission   Lymphoma Brother    Cancer - Other Sister        in remission breast ca    SOCIAL HISTORY Social History   Tobacco Use   Smoking status: Former    Packs/day: 1.00    Years: 22.00    Pack years: 22.00    Types: Cigarettes    Start date: 03/08/1972    Quit date: 01/08/1994    Years since quitting: 27.8   Smokeless tobacco: Never   Tobacco comments:    quit smoking 53yrs ago  Vaping Use   Vaping Use: Never used  Substance Use Topics   Alcohol use: No    Alcohol/week: 0.0 standard drinks   Drug use: No         OPHTHALMIC EXAM:  Base Eye Exam     Visual Acuity (ETDRS)       Right Left   Dist [place] 20/20 20/200 -1   Dist ph [place]  20/125 -2         Tonometry (Tonopen, 11:27 AM)       Right Left   Pressure 16 13  Pt deferred        Pupils       Pupils Dark Light Shape React APD   Right PERRL 6 5 Round Brisk None   Left PERRL 6 6 Round Minimal +2         Visual Fields       Left Right     Full   Restrictions Total superior temporal, superior nasal deficiencies          Extraocular Movement       Right Left    Full, Ortho Full, Ortho         Neuro/Psych     Oriented x3:  Yes   Mood/Affect: Normal         Dilation     Both eyes:   Pt deferred          Slit Lamp and Fundus Exam     External Exam       Right Left   External Normal Normal         Slit Lamp Exam        Right Left   Lids/Lashes Normal Normal   Conjunctiva/Sclera White and quiet White and quiet   Cornea Clear Clear   Anterior Chamber Deep and quiet Deep and quiet   Iris Round and reactive Round and reactive   Lens Centered posterior chamber intraocular lens, Open posterior capsule Centered posterior chamber intraocular lens, Clear PC   Anterior Vitreous Normal Normal         Fundus Exam       Right Left   Posterior Vitreous Posterior vitreous detachment, , Central vitreous floaters Posterior vitreous detachment, , Central vitreous floaters   Disc Normal 3+ Pallor   C/D Ratio 0.55 0.8   Macula Normal Normal   Vessels Normal Normal   Periphery Normal,,  no holes or tears Normal            IMAGING AND PROCEDURES  Imaging and Procedures for 11/09/21  OCT, Retina - OU - Both Eyes       Right Eye Quality was good. Scan locations included subfoveal. Central Foveal Thickness: 240. Progression has been stable. Findings include normal foveal contour.   Left Eye Quality was good. Scan locations included subfoveal. Central Foveal Thickness: 269. Progression has been stable.   Notes OD normal contour, incidental posterior vitreous detachment, OS with diffuse retinal atrophy from previous AI ON stable     Color Fundus Photography Optos - OU - Both Eyes       Right Eye Progression has no prior data. Disc findings include normal observations. Macula : normal observations. Vessels : normal observations. Periphery : normal observations.   Left Eye Progression has no prior data. Disc findings include increased cup to disc ratio, pallor. Vessels : attenuated. Periphery : normal observations.   Notes OD with incidental posterior vitreous detachment. 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 Nathanial Millman office and there is a backlog of access to the home sleep study testing some weeks.  I have urged the patient to continue with all efforts to have the testing done so that she can educate her self as to whether or not she has underlying sleep apnea.  We will have long discussion and that if sleep apnea is detected to undertake all measures that are appropriate under the direction of her doctors to comply with therapy so as to maximize nightly oxygen nation to the brain but most importantly to the eyes and optic nerve     ICD-10-CM   1. Posterior vitreous detachment of both eyes  H43.813 OCT, Retina - OU - Both  Eyes    Color Fundus Photography Optos - OU - Both Eyes    2. Vitreous membranes and strands, right eye  H43.311     3. Retinal hemorrhage, right eye  H35.61     4. Optic nerve atrophy  H47.20     5. Snores  R06.83       1.  OD complete resolution of very small retinal hemorrhage no impact on acuity as expected  2.  Patient is still following up to have her home sleep study completed hopefully in the coming weeks.  3.  Patient to coordinate with her physician regarding therapy should sleep apnea be detected  Ophthalmic Meds Ordered this visit:  No orders of the defined types were placed in this encounter.      Return in about 6 months (around 05/09/2022) for DILATE OU, COLOR FP, OCT.  There are no Patient Instructions on file for this visit.   Explained the diagnoses, plan, and follow up with the patient and they expressed understanding.  Patient expressed understanding of the importance of proper  follow up care.   Clent Demark Sanii Kukla M.D. Diseases & Surgery of the Retina and Vitreous [facility] 11/09/21     Abbreviations: M myopia (nearsighted); A astigmatism; H hyperopia (farsighted); P presbyopia; Mrx spectacle prescription;  CTL contact lenses; OD right eye; OS left eye; OU both eyes  XT exotropia; ET esotropia; PEK punctate epithelial keratitis; PEE punctate epithelial erosions; DES dry eye syndrome; MGD meibomian gland dysfunction; ATs artificial tears; PFAT's preservative free artificial tears; [place] nuclear sclerotic cataract; PSC posterior subcapsular cataract; ERM epi-retinal membrane; PVD posterior vitreous detachment; RD retinal detachment; DM diabetes mellitus; DR diabetic retinopathy; NPDR non-proliferative diabetic retinopathy; PDR proliferative diabetic retinopathy; CSME clinically significant macular edema; DME diabetic macular edema; dbh dot blot hemorrhages; CWS cotton wool spot; POAG primary open angle glaucoma; C/D cup-to-disc ratio; HVF humphrey visual field; GVF goldmann visual field; OCT optical coherence tomography; IOP intraocular pressure; BRVO Branch retinal vein occlusion; CRVO central retinal vein occlusion; CRAO central retinal artery occlusion; BRAO branch retinal artery occlusion; RT retinal tear; SB scleral buckle; PPV pars plana vitrectomy; VH Vitreous hemorrhage; PRP panretinal laser photocoagulation; IVK intravitreal kenalog; VMT vitreomacular traction; MH Macular hole;  NVD neovascularization of the disc; NVE neovascularization elsewhere; AREDS age related eye disease study; ARMD age related macular degeneration; POAG primary open angle glaucoma; EBMD epithelial/anterior basement membrane dystrophy; ACIOL anterior chamber intraocular lens; IOL intraocular lens; PCIOL posterior chamber intraocular lens; Phaco/IOL phacoemulsification with intraocular lens placement; [place] photorefractive keratectomy; LASIK laser assisted in situ keratomileusis; HTN  hypertension; DM diabetes mellitus; COPD chronic obstructive pulmonary disease

## 2021-11-09 NOTE — Addendum Note (Signed)
Addended by: Pricilla Holm A on: 11/09/2021 12:38 PM   Modules accepted: Orders

## 2021-11-09 NOTE — Assessment & Plan Note (Signed)
Patient reports having contacted Dr. Nathanial Millman office and there is a backlog of access to the home sleep study testing some weeks.  I have urged the patient to continue with all efforts to have the testing done so that she can educate her self as to whether or not she has underlying sleep apnea.  We will have long discussion and that if sleep apnea is detected to undertake all measures that are appropriate under the direction of her doctors to comply with therapy so as to maximize nightly oxygen nation to the brain but most importantly to the eyes and optic nerve

## 2021-11-10 ENCOUNTER — Telehealth: Payer: Self-pay | Admitting: Internal Medicine

## 2021-11-10 NOTE — Telephone Encounter (Signed)
The order is already in from 10/20/21

## 2021-11-10 NOTE — Telephone Encounter (Signed)
Patient states appt wait time for [facility] is 10 weeks, patient states she will proceed w/ [facility] for her sleep study instead   Patient is requesting a call back to discuss if sooner sleep study appt at [facility] can be requested by provider due to her retna hemorrhage

## 2021-11-10 NOTE — Telephone Encounter (Signed)
See below

## 2021-11-10 NOTE — Telephone Encounter (Signed)
Can you send to referrals it looks like a question for them

## 2021-11-11 NOTE — Telephone Encounter (Signed)
Spoke with patient. She was aware of wait time with [facility] she just wanted to cancel referral with [facility] since the wait time was about the same.

## 2021-11-17 DIAGNOSIS — H6122 Impacted cerumen, left ear: Secondary | ICD-10-CM | POA: Diagnosis not present

## 2021-11-17 DIAGNOSIS — H6123 Impacted cerumen, bilateral: Secondary | ICD-10-CM | POA: Diagnosis not present

## 2021-11-17 DIAGNOSIS — H6121 Impacted cerumen, right ear: Secondary | ICD-10-CM | POA: Diagnosis not present

## 2021-11-23 ENCOUNTER — Ambulatory Visit (INDEPENDENT_AMBULATORY_CARE_PROVIDER_SITE_OTHER): Payer: Self-pay | Admitting: Plastic Surgery

## 2021-11-23 ENCOUNTER — Other Ambulatory Visit: Payer: Self-pay

## 2021-11-23 VITALS — BP 151/85 | HR 71 | Ht 64.0 in | Wt 225.6 lb

## 2021-11-23 DIAGNOSIS — Z411 Encounter for cosmetic surgery: Secondary | ICD-10-CM

## 2021-11-23 DIAGNOSIS — D696 Thrombocytopenia, unspecified: Secondary | ICD-10-CM

## 2021-11-23 DIAGNOSIS — F19982 Other psychoactive substance use, unspecified with psychoactive substance-induced sleep disorder: Secondary | ICD-10-CM

## 2021-11-25 DIAGNOSIS — D696 Thrombocytopenia, unspecified: Secondary | ICD-10-CM | POA: Insufficient documentation

## 2021-11-25 NOTE — Progress Notes (Signed)
Referring Provider [name], MD [address] [place],  [place] [address]   CC:  Concern over tightness of her lower eyelids.    Gabrielle [name] is an 70 y.o. female.  HPI: 70 year old who has bilateral lower blepharoplasty on June 27, 2018 by Dr. Towanda Malkin.  She has never been completely happy with the result.  She notes a pulling sensation of both lower eyelids.  She also notes a cystic lesion on the right lower eyelid which has been noted since her blepharoplasty.  She notes some ectropion more on the right than the left.  She complains of some visual changes during this time.  She attributes this to her previous surgery but she also clearly has some retina problems based on reviewing her history.  No severe dry eye symptoms.  Allergies  Allergen Reactions   Iodinated Diagnostic Agents Hives   Other Hives, Other (See Comments) and Rash   Shellfish Allergy Hives, Other (See Comments) and Rash   Gadolinium Derivatives Hives   Hydrocodone Other (See Comments)    insomnia   Metronidazole Nausea And Vomiting and Other (See Comments)   Acetazolamide Anxiety    Makes head feel strange   Gabapentin Anxiety    unknown   Mirtazapine Other (See Comments)    Dry eyes    Outpatient Encounter Medications as of 11/23/2021  Medication Sig Note   ALPRAZolam (XANAX) 1 MG tablet TAKE 1 TABLET BY MOUTH 3 TIMES DAILY. (Patient taking differently: Take 1 mg by mouth 3 (three) times daily as needed for anxiety.)    carboxymethylcellulose (REFRESH PLUS) 0.5 % SOLN Place 1 drop into both eyes 3 (three) times daily as needed (dry eyes).    dexlansoprazole (DEXILANT) 60 MG capsule Take 1 capsule (60 mg total) by mouth daily.    Ferrous Sulfate 140 (45 Fe) MG TBCR Take 140 mg by mouth daily. 09/04/2021: Hasn't received from the [place].    Multiple Vitamin (MULTIVITAMIN WITH MINERALS) TABS tablet Take 1 tablet by mouth daily.    ondansetron (ZOFRAN ODT) 4 MG disintegrating tablet 4mg  ODT q4  hours prn nausea/vomit    VITAMIN D PO Take 1 tablet by mouth daily.    zolpidem (AMBIEN) 10 MG tablet Take 1 tablet (10 mg total) by mouth at bedtime as needed for sleep.    No facility-administered encounter medications on file as of 11/23/2021.     Past Medical History:  Diagnosis Date   Anemia    as a child   Anxiety    takes Xanax daily   Arthritis    Cataracts, bilateral    immature   Chronic insomnia 60/45/4098   Complication of anesthesia    Constipation    will occasionally take Milk of Mag   Diverticulosis    Dizziness    rarely   GERD (gastroesophageal reflux disease)    takes Omeprazole every other day   Glaucoma    borderline and no drops required   Hearing loss    but doesn't have hearing aids   History of bronchitis    many many yrs ago   History of colitis    History of colon polyps    Hypertension    hasn't been on meds for the past 46yrs    Insomnia    takes Trazodone nightly as needed and Ambien nightly    Insomnia due to substance 10/11/2014   Joint pain    Joint swelling    PONV (postoperative nausea  and vomiting)    Stroke (HCC)    Tinnitus    takes HCTZ daily to decrease pressure in ears    Past Surgical History:  Procedure Laterality Date   ABDOMINAL HYSTERECTOMY     APPENDECTOMY     COLONOSCOPY     ESOPHAGOGASTRODUODENOSCOPY (EGD) WITH PROPOFOL N/A 07/29/2021   Procedure: ESOPHAGOGASTRODUODENOSCOPY (EGD) WITH PROPOFOL;  Surgeon: [name], MD;  [facility]: [facility];  Service: Endoscopy;  Laterality: N/A;   HEMOSTASIS CLIP PLACEMENT  07/29/2021   Procedure: HEMOSTASIS CLIP PLACEMENT;  Surgeon: [name], MD;  [facility]: [facility];  Service: Endoscopy;;   IR RADIOLOGIST EVAL & MGMT  06/08/2017   POLYPECTOMY  07/29/2021   Procedure: POLYPECTOMY;  Surgeon: [name], MD;  [facility]: [facility];  Service: Endoscopy;;   tinnitus Right    TONSILLECTOMY     TOTAL KNEE ARTHROPLASTY Right 03/13/2014   DR MURPHY   TOTAL KNEE  ARTHROPLASTY Right 03/13/2014   Procedure: TOTAL KNEE ARTHROPLASTY;  Surgeon: [name], MD;  Location: [place];  Service: Orthopedics;  Laterality: Right;   TUBAL LIGATION      Family History  Problem Relation Age of Onset   Depression Mother    Cancer - Other Father    Kidney disease Father    Cancer - Other Sister        breast ca   Cancer - Other Brother        lymphoma in remission   Lymphoma Brother    Cancer - Other Sister        in remission breast ca    Social History   Social History Narrative   Right handed, Caffeine none, Divorced, 2 kids,  PT - 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 Go to Review Flowsheets activity to see all data.    General:  No acute distress,  Alert and oriented, Non-Toxic, Normal speech and affect Heent:  cystic lesion right lower eyelid near incision laterally, 1 mm of scleral show bilaterally, lower lid scars visible but relatively well healed   Assessment/Plan Patient wants surgery to relieve the pulling sensation and sensation of scar of her lower eyelids.  I do not know what surgery I would offer to improve this sensation.  Removal of the cystic lesion would be feasible.  She states suture removal was difficult for her so this would likely need to be done under sedation.  Right now she does not want to proceed with this but will consider this in the future.  [name] 11/25/2021, 12:02 PM

## 2021-11-26 DIAGNOSIS — Z961 Presence of intraocular lens: Secondary | ICD-10-CM | POA: Diagnosis not present

## 2021-11-26 DIAGNOSIS — H0288A Meibomian gland dysfunction right eye, upper and lower eyelids: Secondary | ICD-10-CM | POA: Diagnosis not present

## 2021-11-26 DIAGNOSIS — Z9889 Other specified postprocedural states: Secondary | ICD-10-CM | POA: Diagnosis not present

## 2021-11-26 DIAGNOSIS — H16223 Keratoconjunctivitis sicca, not specified as Sjogren's, bilateral: Secondary | ICD-10-CM | POA: Diagnosis not present

## 2021-11-26 DIAGNOSIS — H43813 Vitreous degeneration, bilateral: Secondary | ICD-10-CM | POA: Diagnosis not present

## 2021-11-26 DIAGNOSIS — M316 Other giant cell arteritis: Secondary | ICD-10-CM | POA: Diagnosis not present

## 2021-11-26 DIAGNOSIS — H40053 Ocular hypertension, bilateral: Secondary | ICD-10-CM | POA: Diagnosis not present

## 2021-11-26 DIAGNOSIS — H02822 Cysts of right lower eyelid: Secondary | ICD-10-CM | POA: Diagnosis not present

## 2021-11-26 DIAGNOSIS — D3132 Benign neoplasm of left choroid: Secondary | ICD-10-CM | POA: Diagnosis not present

## 2021-11-26 DIAGNOSIS — H0288B Meibomian gland dysfunction left eye, upper and lower eyelids: Secondary | ICD-10-CM | POA: Diagnosis not present

## 2021-11-26 DIAGNOSIS — H11823 Conjunctivochalasis, bilateral: Secondary | ICD-10-CM | POA: Diagnosis not present

## 2021-11-26 DIAGNOSIS — H47012 Ischemic optic neuropathy, left eye: Secondary | ICD-10-CM | POA: Diagnosis not present

## 2021-11-27 ENCOUNTER — Telehealth: Payer: Self-pay | Admitting: Internal Medicine

## 2021-11-27 NOTE — Telephone Encounter (Signed)
Patient calling in  Patient says she was prescribed about 2 weeks ago.. since taking medication patient says she has been experiencing some upper arm pain muscle pain & wants to know if this is a side effect  Also wants to know if she can stop taking the medication & take something else  Please call patient [phone]

## 2021-11-27 NOTE — Chronic Care Management (AMB) (Signed)
Spoke with patient,   Reports that she recently started dexilant - started about 2 weeks ago - reports that over the past 2 days that she has started to have muscle aches in her arms / shoulders - wants to know if she can stop taking new medication   Advised for patient she can stop taking the dexilant, but recommended she restart esomeprazole 40mg  daily as she was previously taking - denies any issues with AE when taking  - Advised to reach out should muscle pains persist after stopping   asked to discuss GI concerns / possible 2nd opinion from [facility] or procedure with PCP - has been scheduled for televideo visit   Gabrielle [name], PharmD Clinical Pharmacist, [facility]

## 2021-11-27 NOTE — Telephone Encounter (Signed)
-----   Message from [place], [facility] sent at 11/27/2021  9:05 AM EST ----- Patient also left me a message regarding the same, with request to speak with PharmD. Forwarding to on-site pharmacist.

## 2021-12-14 ENCOUNTER — Encounter: Payer: Self-pay | Admitting: Internal Medicine

## 2021-12-14 ENCOUNTER — Ambulatory Visit (INDEPENDENT_AMBULATORY_CARE_PROVIDER_SITE_OTHER): Payer: PPO | Admitting: Internal Medicine

## 2021-12-14 ENCOUNTER — Other Ambulatory Visit: Payer: Self-pay

## 2021-12-14 ENCOUNTER — Telehealth: Payer: Self-pay | Admitting: Neurology

## 2021-12-14 VITALS — BP 128/90 | HR 69 | Resp 18 | Ht 64.0 in | Wt 224.8 lb

## 2021-12-14 DIAGNOSIS — H9313 Tinnitus, bilateral: Secondary | ICD-10-CM | POA: Diagnosis not present

## 2021-12-14 DIAGNOSIS — K2100 Gastro-esophageal reflux disease with esophagitis, without bleeding: Secondary | ICD-10-CM

## 2021-12-14 NOTE — Telephone Encounter (Signed)
Patient got a referral back to our office to see Dr Tomi Likens she states that he recommended a spinal tap to see what is causing what is going with her. She does not want to do a spinal tap and would like to know if there is something else that he can offer before having to may try a different neurologist    Please call  patient so that she can explain what is going with her

## 2021-12-14 NOTE — Progress Notes (Signed)
° °  Subjective:   Patient ID: Gabrielle [name], female    DOB: 08-06-1951, 70 y.o.   MRN: [id number]  HPI The patient is a 70 YO female coming in for follow up medical conditions.   Review of Systems  Constitutional:  Positive for appetite change.  HENT:  Positive for tinnitus.   Eyes: Negative.   Respiratory:  Negative for cough, chest tightness and shortness of breath.   Cardiovascular:  Negative for chest pain, palpitations and leg swelling.  Gastrointestinal:  Positive for abdominal pain. Negative for abdominal distention, constipation, diarrhea, nausea and vomiting.  Musculoskeletal: Negative.   Skin: Negative.   Neurological: Negative.   Psychiatric/Behavioral: Negative.     Objective:  Physical Exam Constitutional:      Appearance: She is well-developed.  HENT:     Head: Normocephalic and atraumatic.  Cardiovascular:     Rate and Rhythm: Normal rate and regular rhythm.  Pulmonary:     Effort: Pulmonary effort is normal. No respiratory distress.     Breath sounds: Normal breath sounds. No wheezing or rales.  Abdominal:     General: Bowel sounds are normal. There is no distension.     Palpations: Abdomen is soft.     Tenderness: There is no abdominal tenderness. There is no rebound.  Musculoskeletal:     Cervical back: Normal range of motion.  Skin:    General: Skin is warm and dry.  Neurological:     Mental Status: She is alert and oriented to person, place, and time.     Coordination: Coordination normal.    Vitals:   12/14/21 0954  BP: 128/90  Pulse: 69  Resp: 18  SpO2: 99%  Weight: 224 lb 12.8 oz (102 kg)  Height: 5\' 4"  (1.626 m)    This visit occurred during the SARS-CoV-2 public health emergency.  Safety protocols were in place, including screening questions prior to the visit, additional usage of staff PPE, and extensive cleaning of exam room while observing appropriate contact time as indicated for disinfecting solutions.   Assessment & Plan:

## 2021-12-14 NOTE — Telephone Encounter (Signed)
Please see Mychart message 10/4-5/22.  Pt advised of this message. Per Pt she do not remember this message.  Advised pt she did response to the message.  Per Note , Dr.Jaffe did not recommend Spinal tap, He has no further recommendation or explanation at this time.

## 2021-12-14 NOTE — Patient Instructions (Signed)
We will get you in with GI here to consider the stomach wrap for the acid problems.  We will get you in with the neurologist though [facility]

## 2021-12-15 ENCOUNTER — Encounter: Payer: Self-pay | Admitting: Internal Medicine

## 2021-12-15 ENCOUNTER — Other Ambulatory Visit: Payer: Self-pay | Admitting: Gastroenterology

## 2021-12-15 DIAGNOSIS — Z800 Family history of malignant neoplasm of digestive organs: Secondary | ICD-10-CM | POA: Diagnosis not present

## 2021-12-15 DIAGNOSIS — K219 Gastro-esophageal reflux disease without esophagitis: Secondary | ICD-10-CM | POA: Diagnosis not present

## 2021-12-15 DIAGNOSIS — K5904 Chronic idiopathic constipation: Secondary | ICD-10-CM | POA: Diagnosis not present

## 2021-12-15 DIAGNOSIS — K5730 Diverticulosis of large intestine without perforation or abscess without bleeding: Secondary | ICD-10-CM | POA: Diagnosis not present

## 2021-12-15 NOTE — Assessment & Plan Note (Signed)
Would like to see neurology additional opinion and recently referred to local neurology group and this referral was declined. Referral done and will send to [facility] she wishes to see someone skilled in tinnitus.

## 2021-12-15 NOTE — Assessment & Plan Note (Signed)
Intractable and has tried many PPI agents and takes carafate chronically without improvement. She wishes to have second opinion GI visit so referral placed. She wishes to consider endoscopic procedure to wrap stomach to eliminate GERD symptoms. She is currentlty taking nexium 40 mg daily and will continue in the meantime. Recently re-tried dexilant without relief and has tried and failed protonix, nexium, omeprazole, dexilant, prevacid.

## 2021-12-23 ENCOUNTER — Emergency Department (HOSPITAL_COMMUNITY)
Admission: EM | Admit: 2021-12-23 | Discharge: 2021-12-23 | Disposition: A | Discharge status: Home / Self Care | Payer: No Typology Code available for payment source | Attending: Emergency Medicine | Admitting: Emergency Medicine

## 2021-12-23 ENCOUNTER — Emergency Department (HOSPITAL_COMMUNITY): Payer: No Typology Code available for payment source

## 2021-12-23 ENCOUNTER — Encounter (HOSPITAL_COMMUNITY): Payer: Self-pay

## 2021-12-23 DIAGNOSIS — Z96651 Presence of right artificial knee joint: Secondary | ICD-10-CM | POA: Diagnosis not present

## 2021-12-23 DIAGNOSIS — I10 Essential (primary) hypertension: Secondary | ICD-10-CM | POA: Diagnosis not present

## 2021-12-23 DIAGNOSIS — R519 Headache, unspecified: Secondary | ICD-10-CM

## 2021-12-23 DIAGNOSIS — Z87891 Personal history of nicotine dependence: Secondary | ICD-10-CM | POA: Diagnosis not present

## 2021-12-23 DIAGNOSIS — R509 Fever, unspecified: Secondary | ICD-10-CM | POA: Diagnosis not present

## 2021-12-23 DIAGNOSIS — U071 COVID-19: Secondary | ICD-10-CM | POA: Diagnosis not present

## 2021-12-23 DIAGNOSIS — R059 Cough, unspecified: Secondary | ICD-10-CM | POA: Diagnosis not present

## 2021-12-23 LAB — CBC WITH DIFFERENTIAL/PLATELET
Abs Immature Granulocytes: 0.02 10*3/uL (ref 0.00–0.07)
Basophils Absolute: 0 10*3/uL (ref 0.0–0.1)
Basophils Relative: 0 %
Eosinophils Absolute: 0 10*3/uL (ref 0.0–0.5)
Eosinophils Relative: 0 %
HCT: 36.7 % (ref 36.0–46.0)
Hemoglobin: 11.5 g/dL — ABNORMAL LOW (ref 12.0–15.0)
Immature Granulocytes: 1 %
Lymphocytes Relative: 18 %
Lymphs Abs: 0.7 10*3/uL (ref 0.7–4.0)
MCH: 24.4 pg — ABNORMAL LOW (ref 26.0–34.0)
MCHC: 31.3 g/dL (ref 30.0–36.0)
MCV: 77.8 fL — ABNORMAL LOW (ref 80.0–100.0)
Monocytes Absolute: 0.5 10*3/uL (ref 0.1–1.0)
Monocytes Relative: 11 %
Neutro Abs: 2.9 10*3/uL (ref 1.7–7.7)
Neutrophils Relative %: 70 %
Platelets: 120 10*3/uL — ABNORMAL LOW (ref 150–400)
RBC: 4.72 MIL/uL (ref 3.87–5.11)
RDW: 20.7 % — ABNORMAL HIGH (ref 11.5–15.5)
WBC: 4.1 10*3/uL (ref 4.0–10.5)
nRBC: 0 % (ref 0.0–0.2)

## 2021-12-23 LAB — BASIC METABOLIC PANEL
Anion gap: 6 (ref 5–15)
BUN: 11 mg/dL (ref 8–23)
CO2: 26 mmol/L (ref 22–32)
Calcium: 8.7 mg/dL — ABNORMAL LOW (ref 8.9–10.3)
Chloride: 107 mmol/L (ref 98–111)
Creatinine, Ser: 0.93 mg/dL (ref 0.44–1.00)
GFR, Estimated: >60 mL/min (ref >60)
Glucose, Bld: 95 mg/dL (ref 70–99)
Potassium: 3.5 mmol/L (ref 3.5–5.1)
Sodium: 139 mmol/L (ref 135–145)

## 2021-12-23 LAB — RESP PANEL BY RT-PCR (FLU A&B, COVID) ARPGX2
Influenza A by PCR: NEGATIVE
Influenza B by PCR: NEGATIVE
SARS Coronavirus 2 by RT PCR: POSITIVE — AB

## 2021-12-23 MED ORDER — LACTATED RINGERS IV BOLUS
1000.0000 mL | Freq: Once | INTRAVENOUS | Status: AC
  Administered 2021-12-23: 09:00:00 1000 mL via INTRAVENOUS

## 2021-12-23 MED ORDER — FENTANYL CITRATE PF 50 MCG/ML IJ SOSY
25.0000 ug | PREFILLED_SYRINGE | Freq: Once | INTRAMUSCULAR | Status: AC
  Administered 2021-12-23: 10:00:00 25 ug via INTRAVENOUS
  Filled 2021-12-23: qty 1

## 2021-12-23 MED ORDER — METOCLOPRAMIDE HCL 5 MG/ML IJ SOLN
10.0000 mg | Freq: Once | INTRAMUSCULAR | Status: AC
  Administered 2021-12-23: 09:00:00 10 mg via INTRAVENOUS
  Filled 2021-12-23: qty 2

## 2021-12-23 MED ORDER — NIRMATRELVIR/RITONAVIR (PAXLOVID)TABLET: 3.0000 | ORAL_TABLET | Freq: Two times a day (BID) | ORAL | 0 refills | Status: AC

## 2021-12-23 NOTE — Discharge Instructions (Signed)
You tested positive for COVID today which is causing your headache.  You were given the anti-COVID medication paxlovid which should help your symptoms get better quicker however if you do not seem to tolerate the medication it is okay for you to discontinue it as you will get better without it.  You need to take Tylenol every 4 hours for the headache.  Make sure you are getting lots of fluids and rest.

## 2021-12-23 NOTE — ED Triage Notes (Signed)
Pt presents to the ED from home for persistent headache x2 days. Per EMS, pt c/o nasal congestion and has been taking Tylenol for headache without relief. Pt its legally blind in her left eye, otherwise no vision changes or "stroke-like sx" per EMS. Pt has a known hx of HTN however she has not taken any BP medications in 7 years, per EMS. Pt denies any CP, SOB, syncope, numbness/weakness/ or tingling.

## 2021-12-23 NOTE — ED Provider Notes (Signed)
[facility] DEPT Provider Note   CSN: [id number] Arrival date & time: 12/23/21  0800     History Chief Complaint  Patient presents with   Headache    Gabrielle [name] is a 70 y.o. female.  Patient is a 70 year old female with a history of stroke, chronic tinnitus, GERD, hypertension, prior GI bleeding who is presenting today with complaint of headache and URI symptoms.  Patient reports that symptoms started yesterday.  She had hot and cold chills but her thermometer always read 97.8.  She started having cough and congestion but reports the worst thing is the headache.  It has had a headache that is pulsating and throbbing in the front of her head behind her eyes.  She took Tylenol yesterday with slight relief but today when she woke up the headache had become severe again and Tylenol did not help.  She denies any nausea or vomiting.  She has not had any diarrhea.  She has had cough and runny nose but denies any shortness of breath.  No productive cough.  She has not taken a COVID test at home.  She denies any changes in vision, unilateral weakness or numbness.  She has taken her medications this morning.  She does report just feeling globally weak.  The history is provided by the patient.  Headache     Past Medical History:  Diagnosis Date   Anemia    as a child   Anxiety    takes Xanax daily   Arthritis    Cataracts, bilateral    immature   Chronic insomnia 34/19/3790   Complication of anesthesia    Constipation    will occasionally take Milk of Mag   Diverticulosis    Dizziness    rarely   GERD (gastroesophageal reflux disease)    takes Omeprazole every other day   Glaucoma    borderline and no drops required   Hearing loss    but doesn't have hearing aids   History of bronchitis    many many yrs ago   History of colitis    History of colon polyps    Hypertension    hasn't been on meds for the past 37yrs    Insomnia    takes Trazodone  nightly as needed and Ambien nightly    Insomnia due to substance 10/11/2014   Joint pain    Joint swelling    PONV (postoperative nausea and vomiting)    Stroke (HCC)    Tinnitus    takes HCTZ daily to decrease pressure in ears    Patient Active Problem List   Diagnosis Date Noted   Thrombocytopenia ([place]) 11/25/2021   Retinal hemorrhage, right eye 10/12/2021   Snores 10/12/2021   Dysuria 09/04/2021   Iron deficiency 09/04/2021   Lower GI bleed 07/28/2021   Acute blood loss anemia    Gastrointestinal hemorrhage with melena    Leg swelling 07/09/2021   Right hip pain 05/13/2021   Acute bilateral low back pain with right-sided sciatica 05/13/2021   Hives 04/09/2021   Posterior vitreous detachment of both eyes 02/25/2021   Optic nerve atrophy 02/25/2021   Pseudophakia 02/25/2021   Vitreous membranes and strands, right eye 02/25/2021   Right thigh pain 01/22/2021   Hearing loss 08/14/2020   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   Essential (primary) hypertension 08/14/2020   Anxiety and depression 08/14/2020   GAD (generalized anxiety disorder) 08/14/2020   Morbid obesity ([place]) 05/01/2020  Family history of colon cancer 05/01/2020   Diverticular disease of colon 05/01/2020   Chronic idiopathic constipation 05/01/2020   Decreased vision of left eye 02/28/2020   Elevated blood pressure reading in office without diagnosis of hypertension 12/15/2018   Blindness 09/14/2018   GERD (gastroesophageal reflux disease) 04/21/2018   TMJ pain dysfunction syndrome 03/23/2018   Pulsatile tinnitus of both ears 03/23/2018   Chronic post-traumatic stress disorder (PTSD) 03/14/2018   Tinnitus 03/14/2018   Routine general medical examination at a health care facility 03/14/2018   Left ear pain 05/13/2017   Presbycusis of both ears 03/30/2017   Abnormal auditory perception of both ears 06/03/2016   Neck pain 03/12/2016   Abnormality of gait 06/03/2015   S/P TKR (total knee  replacement) 06/03/2015   Chronic left SI joint pain 06/03/2015   Insomnia 10/11/2014   Drug-induced sleep disorder ([place]) 10/11/2014   DJD (degenerative joint disease) of knee 03/13/2014   Sensorineural hearing loss, bilateral 11/27/2013    Past Surgical History:  Procedure Laterality Date   ABDOMINAL HYSTERECTOMY     APPENDECTOMY     COLONOSCOPY     ESOPHAGOGASTRODUODENOSCOPY (EGD) WITH PROPOFOL N/A 07/29/2021   Procedure: ESOPHAGOGASTRODUODENOSCOPY (EGD) WITH PROPOFOL;  Surgeon: [name], MD;  Location: [facility];  Service: Endoscopy;  Laterality: N/A;   HEMOSTASIS CLIP PLACEMENT  07/29/2021   Procedure: HEMOSTASIS CLIP PLACEMENT;  Surgeon: [name], MD;  Location: [facility];  Service: Endoscopy;;   IR RADIOLOGIST EVAL & MGMT  06/08/2017   POLYPECTOMY  07/29/2021   Procedure: POLYPECTOMY;  Surgeon: [name], MD;  Location: [facility];  Service: Endoscopy;;   tinnitus Right    TONSILLECTOMY     TOTAL KNEE ARTHROPLASTY Right 03/13/2014   DR MURPHY   TOTAL KNEE ARTHROPLASTY Right 03/13/2014   Procedure: TOTAL KNEE ARTHROPLASTY;  Surgeon: [name], MD;  Location: [facility];  Service: Orthopedics;  Laterality: Right;   TUBAL LIGATION       OB History   No obstetric history on file.     Family History  Problem Relation Age of Onset   Depression Mother    Cancer - Other Father    Kidney disease Father    Cancer - Other Sister        breast ca   Cancer - Other Brother        lymphoma in remission   Lymphoma Brother    Cancer - Other Sister        in remission breast ca    Social History   Tobacco Use   Smoking status: Former    Packs/day: 1.00    Years: 22.00    Pack years: 22.00    Types: Cigarettes    Start date: 03/08/1972    Quit date: 01/08/1994    Years since quitting: 27.9   Smokeless tobacco: Never   Tobacco comments:    quit smoking 75yrs ago  Vaping Use   Vaping Use: Never used  Substance Use Topics   Alcohol use: No    Alcohol/week:  0.0 standard drinks   Drug use: No    Home Medications Prior to Admission medications   Medication Sig Start Date End Date Taking? Authorizing Provider  ALPRAZolam (XANAX) 1 MG tablet TAKE 1 TABLET BY MOUTH 3 TIMES DAILY. Patient taking differently: Take 1 mg by mouth 3 (three) times daily as needed for anxiety. 05/21/21   [name], MD  baclofen (LIORESAL) 10 MG tablet Take 1 tablet by mouth as  needed. 11/06/21   [provider]  carboxymethylcellulose (REFRESH PLUS) 0.5 % SOLN Place 1 drop into both eyes 3 (three) times daily as needed (dry eyes).    [provider]  esomeprazole (NEXIUM) 40 MG capsule Take 40 mg by mouth daily at 12 noon.    [provider]  Ferrous Sulfate 140 (45 Fe) MG TBCR Take 140 mg by mouth daily. 08/27/21   [provider]  Multiple Vitamin (MULTIVITAMIN WITH MINERALS) TABS tablet Take 1 tablet by mouth daily.    [provider]  ondansetron (ZOFRAN ODT) 4 MG disintegrating tablet 4mg  ODT q4 hours prn nausea/vomit 08/13/21   Milton Ferguson, MD  VITAMIN D PO Take 1 tablet by mouth daily.    [provider]  zolpidem (AMBIEN) 10 MG tablet Take 1 tablet (10 mg total) by mouth at bedtime as needed for sleep. 10/05/21   [name], MD    Allergies    Iodinated contrast media, Other, Shellfish allergy, Gadolinium derivatives, Hydrocodone, Metronidazole, Acetazolamide, Gabapentin, and Mirtazapine  Review of Systems   Review of Systems  Neurological:  Positive for headaches.  All other systems reviewed and are negative.  Physical Exam Updated Vital Signs BP (!) 171/76 (BP Location: Right Arm)    Pulse 76    Temp 99.2 F (37.3 C) (Oral)    Resp (!) 21    Ht 5\' 4"  (1.626 m)    Wt 99.8 kg    SpO2 99%    BMI 37.76 kg/m   Physical Exam Vitals and nursing note reviewed.  Constitutional:      General: She is not in acute distress.    Appearance: She is well-developed.  HENT:     Head:  Normocephalic and atraumatic.     Right Ear: Tympanic membrane normal.     Left Ear: Tympanic membrane normal.     Nose: Congestion present.     Mouth/Throat:     Mouth: Mucous membranes are moist.  Eyes:     Extraocular Movements: Extraocular movements intact.     Pupils: Pupils are equal, round, and reactive to light.     Comments: PERRL on the right but sluggish on the left  Cardiovascular:     Rate and Rhythm: Normal rate and regular rhythm.     Pulses: Normal pulses.     Heart sounds: Normal heart sounds. No murmur heard.   No friction rub.  Pulmonary:     Effort: Pulmonary effort is normal.     Breath sounds: Normal breath sounds. No wheezing or rales.  Abdominal:     General: Bowel sounds are normal. There is no distension.     Palpations: Abdomen is soft.     Tenderness: There is no abdominal tenderness. There is no guarding or rebound.  Musculoskeletal:        General: No tenderness. Normal range of motion.     Right lower leg: No edema.     Left lower leg: No edema.     Comments: No edema  Skin:    General: Skin is warm and dry.     Findings: No rash.  Neurological:     Mental Status: She is alert and oriented to person, place, and time. Mental status is at baseline.     Cranial Nerves: No cranial nerve deficit.     Sensory: No sensory deficit.     Motor: No weakness.  Psychiatric:        Mood and Affect: Mood normal.  Behavior: Behavior normal.    ED Results / Procedures / Treatments   Labs (all labs ordered are listed, but only abnormal results are displayed) Labs Reviewed  RESP PANEL BY RT-PCR (FLU A&B, COVID) ARPGX2 - Abnormal; Notable for the following components:      Result Value   SARS Coronavirus 2 by RT PCR POSITIVE (*)    All other components within normal limits  CBC WITH DIFFERENTIAL/PLATELET - Abnormal; Notable for the following components:   Hemoglobin 11.5 (*)    MCV 77.8 (*)    MCH 24.4 (*)    RDW 20.7 (*)    Platelets 120 (*)     All other components within normal limits  BASIC METABOLIC PANEL - Abnormal; Notable for the following components:   Calcium 8.7 (*)    All other components within normal limits    EKG EKG Interpretation  Date/Time:  Wednesday December 23 2021 08:24:34 EST Ventricular Rate:  73 PR Interval:  160 QRS Duration: 87 QT Interval:  409 QTC Calculation: 451 R Axis:   5 Text Interpretation: Sinus rhythm Abnormal R-wave progression, early transition No significant change since last tracing Confirmed by Blanchie Dessert [phone]) on 12/23/2021 9:01:51 AM  Radiology DG Chest Port 1 View  Result Date: 12/23/2021 CLINICAL DATA:  Cough, fever EXAM: PORTABLE CHEST 1 VIEW COMPARISON:  09/01/2021 FINDINGS: The heart size and mediastinal contours are within normal limits. Both lungs are clear. No pleural effusion. The visualized skeletal structures are unremarkable. IMPRESSION: No acute process in the chest. Electronically Signed   By: Macy Mis M.D.   On: 12/23/2021 09:33    Procedures Procedures   Medications Ordered in ED Medications  metoCLOPramide (REGLAN) injection 10 mg (10 mg Intravenous Given 12/23/21 0921)  lactated ringers bolus 1,000 mL (1,000 mLs Intravenous New Bag/Given 12/23/21 0922)  fentaNYL (SUBLIMAZE) injection 25 mcg (25 mcg Intravenous Given 12/23/21 1020)    ED Course  I have reviewed the triage vital signs and the nursing notes.  Pertinent labs & imaging results that were available during my care of the patient were reviewed by me and considered in my medical decision making (see chart for details).    MDM Rules/Calculators/A&P                         Patient is an elderly female with multiple medical problems presenting today with URI symptoms and severe headache.  Suspect viral etiology COVID versus other viral pathology.  Patient has no strokelike qualities to her symptoms today and low suspicion for subarachnoid hemorrhage.  She has additional cough, chills and  nasal congestion.  Satting 96% on room air with clear breath sounds.  We will give supportive care for the headache.  COVID testing, labs and imaging pending.  11:34 AM I independently evaluated the patient's chest x-ray and interpreted as normal.  Labs are reassuring with stable hemoglobin normal renal function but she did test positive for COVID.  She is a candidate for paxlovid.  Does not appear to have any medication interactions.  EKG without acute findings today.  MDM   Amount and/or Complexity of Data Reviewed Clinical lab tests: ordered and reviewed Tests in the radiology section of CPT: ordered and reviewed Independent visualization of images, tracings, or specimens: yes        Final Clinical Impression(s) / ED Diagnoses Final diagnoses:  Acute intractable headache, unspecified headache type  COVID    Rx / DC Orders ED Discharge Orders  Ordered    nirmatrelvir/ritonavir EUA (PAXLOVID) 20 x 150 MG & 10 x 100MG  TABS  2 times daily        12/23/21 1137             Blanchie Dessert, MD 12/23/21 1137

## 2021-12-25 ENCOUNTER — Telehealth: Payer: Self-pay | Admitting: Internal Medicine

## 2021-12-25 MED ORDER — LIDOCAINE VISCOUS HCL 2 % MT SOLN: 15.0000 mL | OROMUCOSAL | 0 refills | Status: DC | PRN

## 2021-12-25 NOTE — Telephone Encounter (Signed)
Patient covid+ 12.27.22 at [facility] ED  Patient says she was given antiviral but she is not tolerating it well  Says her throat is severely raw & sore  & hurts when she swallows... wants to know what provider can recommend or suggest for this  Please call [phone]

## 2021-12-25 NOTE — Telephone Encounter (Signed)
This is duplicative, can we combine these two telephone encounters?

## 2021-12-25 NOTE — Telephone Encounter (Signed)
Sent in lidocaine liquid she can swish and swallow to see if this helps with her throat. Okay to stop the antiviral if this is making her feel sick.

## 2021-12-25 NOTE — Telephone Encounter (Signed)
Called pt. LDVM at [phone] with recommendations from Dr. Sharlet Salina. Office number was provided in case she has additional questions or concerns.

## 2021-12-25 NOTE — Telephone Encounter (Signed)
Does she need a virtual since she is already taking the antiviral?

## 2021-12-25 NOTE — Telephone Encounter (Signed)
See below

## 2021-12-25 NOTE — Telephone Encounter (Signed)
Connected to [facility] 12.29.2022.   Caller states has COVID tested positive yesterday with congestion, headache and sore throat. States was prescribed Paxlovid started this morning but cant tolerate medicine. Symptoms started about Tuesday.Denies fever.   Advised to call PCP within 24 hours.

## 2021-12-25 NOTE — Telephone Encounter (Signed)
Patient returning nurse's call  Advised patient of provider's recommendations  Patient had questions and concerned about stopping antivirals  Nurse could not take patient's call but was able to provide patient with answers via chat, that I relayed to patient  Patient states she still has questions and  concerns and prefers to speak w/ nurse  Please call patient

## 2021-12-29 NOTE — Telephone Encounter (Signed)
See below

## 2021-12-29 NOTE — Telephone Encounter (Signed)
Patient states she is having nasal drainage   Patient states she was prescribed hydrocodone cough meds  Patient inquiring if medication will help w/ drainage  Patient requesting a call back

## 2021-12-30 ENCOUNTER — Other Ambulatory Visit: Payer: Self-pay | Admitting: Internal Medicine

## 2021-12-30 DIAGNOSIS — F4312 Post-traumatic stress disorder, chronic: Secondary | ICD-10-CM

## 2021-12-30 NOTE — Telephone Encounter (Signed)
Patient wants to know if provider can send something to the pharmacy for the drainage she is having  Currently no OV or VV available  Please call [phone]  [id number] - [place], [place] [address]  Phone:  [phone] Fax:  [phone]

## 2021-12-30 NOTE — Telephone Encounter (Signed)
No, a hydrocodone cough medication will not help with drainage. This is meant to suppress coughing.

## 2021-12-30 NOTE — Telephone Encounter (Signed)
See below

## 2021-12-31 NOTE — Telephone Encounter (Signed)
Typically allergy medication over the counter (zyrtec, claritin, allegra) is best for drainage. Sinus rinses can also help. Flonase is also available otc and can help with drainage.

## 2022-01-01 NOTE — Telephone Encounter (Signed)
Attempted to call pt at phone number [phone]. Phone disconnected after second.   If pt calls back please give Crawford's recommendation as stated  Typically allergy medication over the counter (zyrtec, claritin, allegra) is best for drainage. Sinus rinses can also help. Flonase is also available otc and can help with drainage.   I will send a mychart message as well.

## 2022-01-01 NOTE — Telephone Encounter (Signed)
Patient calling in to check status of refill request  Patient also wants to know what happened w/ her referral for the Sleep Study? Patient says she was told she would receive a call for scheduling but never did  Please call [phone]

## 2022-01-04 DIAGNOSIS — H9313 Tinnitus, bilateral: Secondary | ICD-10-CM | POA: Diagnosis not present

## 2022-01-04 DIAGNOSIS — H6501 Acute serous otitis media, right ear: Secondary | ICD-10-CM | POA: Diagnosis not present

## 2022-01-04 DIAGNOSIS — H6981 Other specified disorders of Eustachian tube, right ear: Secondary | ICD-10-CM | POA: Diagnosis not present

## 2022-01-21 ENCOUNTER — Other Ambulatory Visit: Payer: Self-pay

## 2022-01-21 ENCOUNTER — Ambulatory Visit (INDEPENDENT_AMBULATORY_CARE_PROVIDER_SITE_OTHER): Payer: PPO

## 2022-01-21 DIAGNOSIS — H3560 Retinal hemorrhage, unspecified eye: Secondary | ICD-10-CM

## 2022-01-21 DIAGNOSIS — R0683 Snoring: Secondary | ICD-10-CM | POA: Diagnosis not present

## 2022-01-22 ENCOUNTER — Telehealth: Payer: Self-pay | Admitting: Pulmonary Disease

## 2022-01-22 NOTE — Telephone Encounter (Signed)
I spoke to pt this morning - she also called me.  She did study and was concerned about how it went.  I told her if there wasn't any info on the machine I would let her know and she could repeat the study.  She brought in the machine and I was able to download info and gave to RA to read.  Nothing further needed at this time.

## 2022-01-22 NOTE — Telephone Encounter (Signed)
Gabrielle [name].  I think this is yours.Marland KitchenMarland KitchenMarland Kitchen

## 2022-01-26 DIAGNOSIS — G4733 Obstructive sleep apnea (adult) (pediatric): Secondary | ICD-10-CM | POA: Diagnosis not present

## 2022-02-08 ENCOUNTER — Telehealth: Payer: Self-pay | Admitting: Pulmonary Disease

## 2022-02-08 NOTE — Telephone Encounter (Signed)
Spoke to patient, who is requesting sleep study results. I have recommended that she contact PCP, as Dr. Sharlet Salina ordered sleep study.  She voiced her understanding and had no further questions. Nothing further needed.

## 2022-02-09 ENCOUNTER — Telehealth: Payer: Self-pay

## 2022-02-09 ENCOUNTER — Telehealth: Payer: Self-pay | Admitting: Internal Medicine

## 2022-02-09 NOTE — Telephone Encounter (Signed)
Pt requesting a call back to discuss sleep study and lab results

## 2022-02-09 NOTE — Telephone Encounter (Signed)
I would be happy to review results and give opinion if we have those labs. That is not adequate information to make an interpretation. The sleep study was negative for signs of sleep apnea in a significant way. If patient desires copy can be printed from media tab and mailed to her.

## 2022-02-09 NOTE — Telephone Encounter (Signed)
Pt is calling in wanting her results from sleep study and the most recent labs ordered by Dr. Sharlet Salina. Sleep study results are not in the system.  Pt had labs done 3 days ago Iron was 14. Something and [facility] provider wants pt to have 2 iron infusion done tomorrow due to the labs being abnormal.   Pt is wanting to know Dr. Sharlet Salina recommendation on the infusions.  Pt CB [phone]

## 2022-02-09 NOTE — Telephone Encounter (Signed)
See other note

## 2022-02-09 NOTE — Telephone Encounter (Signed)
See below

## 2022-02-11 NOTE — Telephone Encounter (Signed)
Pt checking status of return call regarding results  Informed pt of sleep study results noted by provider below, pt expressed understanding   Pt requesting a c/b to discuss iron infusion

## 2022-02-15 DIAGNOSIS — D3132 Benign neoplasm of left choroid: Secondary | ICD-10-CM | POA: Diagnosis not present

## 2022-02-15 DIAGNOSIS — H47012 Ischemic optic neuropathy, left eye: Secondary | ICD-10-CM | POA: Diagnosis not present

## 2022-02-15 DIAGNOSIS — H43813 Vitreous degeneration, bilateral: Secondary | ICD-10-CM | POA: Diagnosis not present

## 2022-02-15 DIAGNOSIS — H02822 Cysts of right lower eyelid: Secondary | ICD-10-CM | POA: Diagnosis not present

## 2022-02-15 DIAGNOSIS — Z961 Presence of intraocular lens: Secondary | ICD-10-CM | POA: Diagnosis not present

## 2022-02-15 DIAGNOSIS — H0288B Meibomian gland dysfunction left eye, upper and lower eyelids: Secondary | ICD-10-CM | POA: Diagnosis not present

## 2022-02-15 DIAGNOSIS — H40053 Ocular hypertension, bilateral: Secondary | ICD-10-CM | POA: Diagnosis not present

## 2022-02-15 DIAGNOSIS — H16223 Keratoconjunctivitis sicca, not specified as Sjogren's, bilateral: Secondary | ICD-10-CM | POA: Diagnosis not present

## 2022-02-15 DIAGNOSIS — H11823 Conjunctivochalasis, bilateral: Secondary | ICD-10-CM | POA: Diagnosis not present

## 2022-02-15 DIAGNOSIS — H0288A Meibomian gland dysfunction right eye, upper and lower eyelids: Secondary | ICD-10-CM | POA: Diagnosis not present

## 2022-02-22 ENCOUNTER — Encounter: Payer: Self-pay | Admitting: Internal Medicine

## 2022-02-22 ENCOUNTER — Other Ambulatory Visit: Payer: Self-pay

## 2022-02-22 ENCOUNTER — Ambulatory Visit (INDEPENDENT_AMBULATORY_CARE_PROVIDER_SITE_OTHER): Payer: PPO | Admitting: Internal Medicine

## 2022-02-22 DIAGNOSIS — I10 Essential (primary) hypertension: Secondary | ICD-10-CM | POA: Diagnosis not present

## 2022-02-22 DIAGNOSIS — E611 Iron deficiency: Secondary | ICD-10-CM

## 2022-02-22 NOTE — Progress Notes (Signed)
° °  Subjective:   Patient ID: Gabrielle [name], female    DOB: 1951/07/09, 71 y.o.   MRN: [id number]  HPI The patient is here for follow up with concerns.  Review of Systems  Constitutional:  Positive for fatigue.  HENT:  Positive for tinnitus.   Eyes: Negative.   Respiratory:  Negative for cough, chest tightness and shortness of breath.   Cardiovascular:  Negative for chest pain, palpitations and leg swelling.  Gastrointestinal:  Negative for abdominal distention, abdominal pain, constipation, diarrhea, nausea and vomiting.  Musculoskeletal: Negative.   Skin: Negative.   Neurological: Negative.   Psychiatric/Behavioral: Negative.     Objective:  Physical Exam Constitutional:      Appearance: She is well-developed. She is obese.  HENT:     Head: Normocephalic and atraumatic.  Cardiovascular:     Rate and Rhythm: Normal rate and regular rhythm.  Pulmonary:     Effort: Pulmonary effort is normal. No respiratory distress.     Breath sounds: Normal breath sounds. No wheezing or rales.  Abdominal:     General: Bowel sounds are normal. There is no distension.     Palpations: Abdomen is soft.     Tenderness: There is no abdominal tenderness. There is no rebound.  Musculoskeletal:     Cervical back: Normal range of motion.  Skin:    General: Skin is warm and dry.  Neurological:     Mental Status: She is alert and oriented to person, place, and time.     Coordination: Coordination normal.    Vitals:   02/22/22 1412 02/22/22 1447  BP: (!) 160/94 (!) 152/92  Pulse: 70   Temp: 98 F (36.7 C)   TempSrc: Oral   SpO2: 99%   Weight: 219 lb (99.3 kg)   Height: 5\' 4"  (1.626 m)     This visit occurred during the SARS-CoV-2 public health emergency.  Safety protocols were in place, including screening questions prior to the visit, additional usage of staff PPE, and extensive cleaning of exam room while observing appropriate contact time as indicated for disinfecting solutions.    Assessment & Plan:  Visit time 25 minutes in face to face communication with patient and coordination of care, additional 10 minutes spent in record review, coordination or care, ordering tests, communicating/referring to other healthcare professionals, documenting in medical records all on the same day of the visit for total time 35 minutes spent on the visit.

## 2022-02-24 DIAGNOSIS — H47292 Other optic atrophy, left eye: Secondary | ICD-10-CM | POA: Diagnosis not present

## 2022-02-24 DIAGNOSIS — H02115 Cicatricial ectropion of left lower eyelid: Secondary | ICD-10-CM | POA: Diagnosis not present

## 2022-02-24 DIAGNOSIS — H02112 Cicatricial ectropion of right lower eyelid: Secondary | ICD-10-CM | POA: Diagnosis not present

## 2022-02-25 DIAGNOSIS — H02115 Cicatricial ectropion of left lower eyelid: Secondary | ICD-10-CM | POA: Diagnosis not present

## 2022-02-25 DIAGNOSIS — H02112 Cicatricial ectropion of right lower eyelid: Secondary | ICD-10-CM | POA: Diagnosis not present

## 2022-02-26 ENCOUNTER — Ambulatory Visit (HOSPITAL_COMMUNITY): Admit: 2022-02-26 | Payer: Non-veteran care | Admitting: Gastroenterology

## 2022-02-26 ENCOUNTER — Ambulatory Visit (INDEPENDENT_AMBULATORY_CARE_PROVIDER_SITE_OTHER): Payer: PPO

## 2022-02-26 ENCOUNTER — Encounter (HOSPITAL_COMMUNITY): Payer: Self-pay

## 2022-02-26 DIAGNOSIS — Z0000 Encounter for general adult medical examination without abnormal findings: Secondary | ICD-10-CM | POA: Diagnosis not present

## 2022-02-26 SURGERY — ESOPHAGOGASTRODUODENOSCOPY (EGD) WITH PROPOFOL
Anesthesia: Monitor Anesthesia Care

## 2022-02-26 NOTE — Progress Notes (Signed)
I connected with Gabrielle [name] today by telephone and verified that I am speaking with the correct person using two identifiers. Location patient: home Location provider: work Persons participating in the virtual visit: patient, provider.   I discussed the limitations, risks, security and privacy concerns of performing an evaluation and management service by telephone and the availability of in person appointments. I also discussed with the patient that there may be a patient responsible charge related to this service. The patient expressed understanding and verbally consented to this telephonic visit.    Interactive audio and video telecommunications were attempted between this provider and patient, however failed, due to patient having technical difficulties OR patient did not have access to video capability.  We continued and completed visit with audio only.  Some vital signs may be absent or patient reported.   Time Spent with patient on telephone encounter: 40 minutes  Subjective:   Gabrielle [name] is a 71 y.o. female who presents for Medicare Annual (Subsequent) preventive examination.  Review of Systems     Cardiac Risk Factors include: advanced age (>73men, >63 women);hypertension;obesity (BMI >30kg/m2)     Objective:    There were no vitals filed for this visit. There is no height or weight on file to calculate BMI.  Advanced Directives 02/26/2022 09/01/2021 08/13/2021 07/28/2021 07/28/2021 02/24/2021 02/16/2021  Does Patient Have a Medical Advance Directive? Yes No No No No Yes No  Type of Advance Directive Living will;Healthcare Power of [place] -  Does patient want to make changes to medical advance directive? No - Patient declined - - - - - -  Copy of [place] in Chart? No - copy requested - - - - No - copy requested -  Would patient like information on creating a medical advance directive? - No - Patient declined No -  Patient declined No - Patient declined - - -  Pre-existing out of facility DNR order (yellow form or pink MOST form) - - - - - - -    Current Medications (verified) Outpatient Encounter Medications as of 02/26/2022  Medication Sig   ALPRAZolam (XANAX) 1 MG tablet TAKE 1 TABLET BY MOUTH THREE TIMES A DAY   baclofen (LIORESAL) 10 MG tablet Take 1 tablet by mouth as needed.   carboxymethylcellulose (REFRESH PLUS) 0.5 % SOLN Place 1 drop into both eyes 3 (three) times daily as needed (dry eyes).   esomeprazole (NEXIUM) 40 MG capsule Take 40 mg by mouth daily at 12 noon.   Ferrous Sulfate 140 (45 Fe) MG TBCR Take 140 mg by mouth daily.   latanoprost (XALATAN) 0.005 % ophthalmic solution 1 drop at bedtime. (Patient not taking: Reported on 02/22/2022)   lidocaine (XYLOCAINE) 2 % solution Use as directed 15 mLs in the mouth or throat every 4 (four) hours as needed for mouth pain.   Multiple Vitamin (MULTIVITAMIN WITH MINERALS) TABS tablet Take 1 tablet by mouth daily.   ondansetron (ZOFRAN ODT) 4 MG disintegrating tablet 4mg  ODT q4 hours prn nausea/vomit   VITAMIN D PO Take 1 tablet by mouth daily.   zolpidem (AMBIEN) 10 MG tablet Take 1 tablet (10 mg total) by mouth at bedtime as needed for sleep.   No facility-administered encounter medications on file as of 02/26/2022.    Allergies (verified) Iodinated contrast media, Other, Shellfish allergy, Gadolinium derivatives, Hydrocodone, Metronidazole, Acetazolamide, Gabapentin, and Mirtazapine   History: Past Medical History:  Diagnosis Date   Anemia  as a child   Anxiety    takes Xanax daily   Arthritis    Cataracts, bilateral    immature   Chronic insomnia 37/09/6268   Complication of anesthesia    Constipation    will occasionally take Milk of Mag   Diverticulosis    Dizziness    rarely   GERD (gastroesophageal reflux disease)    takes Omeprazole every other day   Glaucoma    borderline and no drops required   Hearing loss    but  doesn't have hearing aids   History of bronchitis    many many yrs ago   History of colitis    History of colon polyps    Hypertension    hasn't been on meds for the past 70yrs    Insomnia    takes Trazodone nightly as needed and Ambien nightly    Insomnia due to substance 10/11/2014   Joint pain    Joint swelling    PONV (postoperative nausea and vomiting)    Stroke (HCC)    Tinnitus    takes HCTZ daily to decrease pressure in ears   Past Surgical History:  Procedure Laterality Date   ABDOMINAL HYSTERECTOMY     APPENDECTOMY     COLONOSCOPY     ESOPHAGOGASTRODUODENOSCOPY (EGD) WITH PROPOFOL N/A 07/29/2021   Procedure: ESOPHAGOGASTRODUODENOSCOPY (EGD) WITH PROPOFOL;  Surgeon: [name], MD;  Location: [facility];  Service: Endoscopy;  Laterality: N/A;   HEMOSTASIS CLIP PLACEMENT  07/29/2021   Procedure: HEMOSTASIS CLIP PLACEMENT;  Surgeon: [name], MD;  Location: [facility];  Service: Endoscopy;;   IR RADIOLOGIST EVAL & MGMT  06/08/2017   POLYPECTOMY  07/29/2021   Procedure: POLYPECTOMY;  Surgeon: [name], MD;  Location: [facility];  Service: Endoscopy;;   tinnitus Right    TONSILLECTOMY     TOTAL KNEE ARTHROPLASTY Right 03/13/2014   DR MURPHY   TOTAL KNEE ARTHROPLASTY Right 03/13/2014   Procedure: TOTAL KNEE ARTHROPLASTY;  Surgeon: [name], MD;  Location: [place];  Service: Orthopedics;  Laterality: Right;   TUBAL LIGATION     Family History  Problem Relation Age of Onset   Depression Mother    Cancer - Other Father    Kidney disease Father    Cancer - Other Sister        breast ca   Cancer - Other Brother        lymphoma in remission   Lymphoma Brother    Cancer - Other Sister        in remission breast ca   Social History   Socioeconomic History   Marital status: Divorced    Spouse name: Not on file   Number of children: 2   Years of education: Not on file   Highest education level: Not on file  Occupational History   Occupation: retired   Tobacco Use   Smoking status: Former    Packs/day: 1.00    Years: 22.00    Pack years: 22.00    Types: Cigarettes    Start date: 03/08/1972    Quit date: 01/08/1994    Years since quitting: 28.1   Smokeless tobacco: Never   Tobacco comments:    quit smoking 10yrs ago  Vaping Use   Vaping Use: Never used  Substance and Sexual Activity   Alcohol use: No    Alcohol/week: 0.0 standard drinks   Drug use: No   Sexual activity: Not Currently    Birth control/protection: Surgical  Other Topics Concern   Not on file  Social History Narrative   Right handed, Caffeine none, Divorced, 2 kids,  PT - Housing auth in [place],   13.5 yrs school; one level home   Social Determinants of Health   Financial Resource Strain: Low Risk    Difficulty of Paying Living Expenses: Not hard at all  Food Insecurity: No Food Insecurity   Worried About Charity fundraiser in the Last Year: Never true   Arboriculturist in the Last Year: Never true  Transportation Needs: No Transportation Needs   Lack of Transportation (Medical): No   Lack of Transportation (Non-Medical): No  Physical Activity: Inactive   Days of Exercise per Week: 0 days   Minutes of Exercise per Session: 0 min  Stress: No Stress Concern Present   Feeling of Stress : Not at all  Social Connections: Moderately Integrated   Frequency of Communication with Friends and Family: More than three times a week   Frequency of Social Gatherings with Friends and Family: More than three times a week   Attends Religious Services: 1 to 4 times per year   Active Member of [employer] or Organizations: Yes   Attends Archivist Meetings: 1 to 4 times per year   Marital Status: Divorced    Tobacco Counseling Counseling given: Not Answered Tobacco comments: quit smoking 58yrs ago   Clinical Intake:  Pre-visit preparation completed: Yes  Pain : No/denies pain     Nutritional Risks: None Diabetes: No  How often do you need to have someone  help you when you read instructions, pamphlets, or other written materials from your doctor or pharmacy?: 1 - Never What is the last grade level you completed in school?: HSG; 2 years of college  Diabetic? no  Interpreter Needed?: No  Information entered by :: Lisette Abu, LPN   Activities of Daily Living In your present state of health, do you have any difficulty performing the following activities: 02/26/2022 07/28/2021  Hearing? Y N  Vision? N N  Difficulty concentrating or making decisions? N N  Walking or climbing stairs? Y Y  Comment - secondary to weakness  Dressing or bathing? Y Y  Doing errands, shopping? Y N  Preparing Food and eating ? Y -  Using the Toilet? N -  In the past six months, have you accidently leaked urine? N -  Do you have problems with loss of bowel control? N -  Managing your Medications? N -  Managing your Finances? N -  Housekeeping or managing your Housekeeping? Y -  Some recent data might be hidden    Patient Care Team: [name], MD as PCP - General (Internal Medicine) Pieter Partridge, DO as Consulting Physician (Neurology) Pieter Partridge, DO as Consulting Physician (Neurology) Warden Fillers, MD as Consulting Physician (Ophthalmology) Barry Brunner III, MD as Referring Physician (Ophthalmology) Charlton Haws, [facility] as Pharmacist (Pharmacist) [name] Clent Demark, MD as Consulting Physician (Ophthalmology)  Indicate any recent Medical Services you may have received from other than [facility] providers in the past year (date may be approximate).     Assessment:   This is a routine wellness examination for Gabrielle [name].  Hearing/Vision screen Hearing Screening - Comments:: Patient has issue with tinnitus. Patient wears hearing aids. Vision Screening - Comments:: Patient wears corrective glasses/contacts.   Eye exam done by: Dr. Warden Fillers, Dr. Deloria Lair  Dietary issues and exercise activities discussed: Current Exercise  Habits: The  patient does not participate in regular exercise at present, Exercise limited by: psychological condition(s);orthopedic condition(s)   Goals Addressed   None   Depression Screen PHQ 2/9 Scores 02/26/2022 02/22/2022 02/24/2021 11/10/2020 10/10/2019 11/07/2018 10/06/2018  PHQ - 2 Score 6 6 3 2 3 6 6   PHQ- 9 Score 9 9 - 7 9 14 16   Exception Documentation - - - - - - -    Fall Risk Fall Risk  02/26/2022 02/22/2022 05/12/2021 02/24/2021 02/16/2021  Falls in the past year? 0 0 1 0 0  Number falls in past yr: 0 0 0 0 0  Injury with Fall? 0 0 1 0 0  Comment - - right hip - -  Risk for fall due to : No Fall Risks - - No Fall Risks -  Follow up Falls evaluation completed - Falls evaluation completed - -    FALL RISK PREVENTION PERTAINING TO THE HOME:  Any stairs in or around the home? No  If so, are there any without handrails? No  Home free of loose throw rugs in walkways, pet beds, electrical cords, etc? Yes  Adequate lighting in your home to reduce risk of falls? Yes   ASSISTIVE DEVICES UTILIZED TO PREVENT FALLS:  Life alert? Yes  Use of a cane, walker or w/c? Yes  Grab bars in the bathroom? Yes  Shower chair or bench in shower? Yes  Elevated toilet seat or a handicapped toilet? Yes   TIMED UP AND GO:  Was the test performed? No .  Length of time to ambulate 10 feet: n/a sec.   Gait steady and fast with assistive device  Cognitive Function: Normal cognitive status assessed by direct observation by this Nurse Health Advisor. No abnormalities found.          Immunizations Immunization History  Administered Date(s) Administered   Influenza Split 08/28/2015, 09/26/2016   Influenza, High Dose Seasonal PF 09/22/2017, 11/05/2018, 11/28/2018   Influenza,inj,Quad PF,6+ Mos 09/27/2019, 10/09/2021   Influenza-Unspecified 08/27/2014, 09/26/2016, 09/18/2017, 09/26/2020, 12/27/2020   PFIZER(Purple Top)SARS-COV-2 Vaccination 03/27/2020, 04/22/2020, 10/23/2020   Tdap 12/27/2013    Zoster Recombinat (Shingrix) 08/08/2019    TDAP status: Up to date  Flu Vaccine status: Up to date  Pneumococcal vaccine status: Declined,  Education has been provided regarding the importance of this vaccine but patient still declined. Advised may receive this vaccine at local pharmacy or Health Dept. Aware to provide a copy of the vaccination record if obtained from local pharmacy or Health Dept. Verbalized acceptance and understanding.   Covid-19 vaccine status: Completed vaccines  Qualifies for Shingles Vaccine? Yes   Zostavax completed No   Shingrix Completed?: No.    Education has been provided regarding the importance of this vaccine. Patient has been advised to call insurance company to determine out of pocket expense if they have not yet received this vaccine. Advised may also receive vaccine at local pharmacy or Health Dept. Verbalized acceptance and understanding.  Screening Tests Health Maintenance  Topic Date Due   Hepatitis C Screening  Never done   COVID-19 Vaccine (4 - Booster for Pfizer series) 12/18/2020   Pneumonia Vaccine 4+ Years old (1 - PCV) 02/27/2023 (Originally 07/03/2016)   MAMMOGRAM  05/05/2023   TETANUS/TDAP  12/28/2023   COLONOSCOPY (Pts 45-26yrs Insurance coverage will need to be confirmed)  10/30/2028   INFLUENZA VACCINE  Completed   DEXA SCAN  Completed   HPV VACCINES  Aged Out   Zoster Vaccines- Shingrix  Discontinued    Health  Maintenance  Health Maintenance Due  Topic Date Due   Hepatitis C Screening  Never done   COVID-19 Vaccine (4 - Booster for Pfizer series) 12/18/2020    Colorectal cancer screening: Type of screening: Colonoscopy. Completed 10/30/2018. Repeat every 10 years  Mammogram status: Completed 05/04/2021. Repeat every year  Bone Density status: Completed 08/14/2019. Results reflect: Bone density results: OSTEOPENIA. Repeat every 2-3 years.  Lung Cancer Screening: (Low Dose CT Chest recommended if Age 45-80 years, 30 pack-year  currently smoking OR have quit w/in 15years.) does not qualify.   Lung Cancer Screening Referral: no  Additional Screening:  Hepatitis C Screening: does qualify; Completed no  Vision Screening: Recommended annual ophthalmology exams for early detection of glaucoma and other disorders of the eye. Is the patient up to date with their annual eye exam?  Yes  Who is the provider or what is the name of the office in which the patient attends annual eye exams? Warden Fillers, MD and Deloria Lair, MD. If pt is not established with a provider, would they like to be referred to a provider to establish care? No .   Dental Screening: Recommended annual dental exams for proper oral hygiene  Community Resource Referral / Chronic Care Management: CRR required this visit?  No   CCM required this visit?  No      Plan:     I have personally reviewed and noted the following in the patients chart:   Medical and social history Use of alcohol, tobacco or illicit drugs  Current medications and supplements including opioid prescriptions.  Functional ability and status Nutritional status Physical activity Advanced directives List of other physicians Hospitalizations, surgeries, and ER visits in previous 12 months Vitals Screenings to include cognitive, depression, and falls Referrals and appointments  In addition, I have reviewed and discussed with patient certain preventive protocols, quality metrics, and best practice recommendations. A written personalized care plan for preventive services as well as general preventive health recommendations were provided to patient.     Sheral Flow, LPN   [id number]   Nurse Notes:  Patient is cogitatively intact. There were no vitals filed for this visit. There is no height or weight on file to calculate BMI.

## 2022-02-26 NOTE — Assessment & Plan Note (Signed)
[facility], did not bring copy but remembers iron was still low and Hg around 12. This is improved from prior. We did spend a lot of time discussing iron infusion which was recommended by [facility] and natural course of replacement of iron stores since she is no longer bleeding. 1 episode of bleeding was the source of her blood loss and utilization of iron stores to replace. She is now not anemic on most recent labs from [place]. She is recommended to continue her oral iron liquid daily.  ?

## 2022-02-26 NOTE — Assessment & Plan Note (Signed)
BP has typically been at goal but moderately elevated today. She is not taking BP medication currently. She has capacity to monitor and we will bring her back for nurse visit for BP check in 1-2 weeks and if elevated will resume BP medication.  ?

## 2022-02-26 NOTE — Patient Instructions (Signed)
[name] , Thank you for taking time to come for your Medicare Wellness Visit. I appreciate your ongoing commitment to your health goals. Please review the following plan we discussed and let me know if I can assist you in the future.   Screening recommendations/referrals: Colonoscopy: 10/30/2018; due every 10 years Mammogram: 05/04/2021; due every 1-2 years Bone Density: 08/14/2019; due every 2-3 years Recommended yearly ophthalmology/optometry visit for glaucoma screening and checkup Recommended yearly dental visit for hygiene and checkup  Vaccinations: Influenza vaccine: 10/09/2021 Pneumococcal vaccine: declined Tdap vaccine: 12/27/2013; due every 10 years Shingles vaccine: 08/08/2019 (patient had allergic reaction)   Covid-19: 03/27/2020, 04/22/2020, 10/23/2020  Advanced directives: Please bring a copy of your health care power of attorney and living will to the office at your convenience.  Conditions/risks identified: Yes; Client understands the importance of follow-up appointments with providers by attending scheduled visits and discussed goals to eat healthier, increase physical activity 5 times a week for 30 minutes each, exercise the brain by doing stimulating brain exercises (reading, adult coloring, crafting, listening to music, puzzles, etc.), socialize and enjoy life more, get enough sleep at least 8-9 hours average per night and make time for laughter.  Next appointment: Please schedule your next Medicare Wellness Visit with your Nurse Health Advisor in 1 year by calling [phone].   Preventive Care 71 Years and Older, Female Preventive care refers to lifestyle choices and visits with your health care provider that can promote health and wellness. What does preventive care include? A yearly physical exam. This is also called an annual well check. Dental exams once or twice a year. Routine eye exams. Ask your health care provider how often you should have your eyes  checked. Personal lifestyle choices, including: Daily care of your teeth and gums. Regular physical activity. Eating a healthy diet. Avoiding tobacco and drug use. Limiting alcohol use. Practicing safe sex. Taking low-dose aspirin every day. Taking vitamin and mineral supplements as recommended by your health care provider. What happens during an annual well check? The services and screenings done by your health care provider during your annual well check will depend on your age, overall health, lifestyle risk factors, and family history of disease. Counseling  Your health care provider may ask you questions about your: Alcohol use. Tobacco use. Drug use. Emotional well-being. Home and relationship well-being. Sexual activity. Eating habits. History of falls. Memory and ability to understand (cognition). Work and work Statistician. Reproductive health. Screening  You may have the following tests or measurements: Height, weight, and BMI. Blood pressure. Lipid and cholesterol levels. These may be checked every 5 years, or more frequently if you are over 71 years old. Skin check. Lung cancer screening. You may have this screening every year starting at age 71 if you have a 30-pack-year history of smoking and currently smoke or have quit within the past 15 years. Fecal occult blood test (FOBT) of the stool. You may have this test every year starting at age 71. Flexible sigmoidoscopy or colonoscopy. You may have a sigmoidoscopy every 5 years or a colonoscopy every 10 years starting at age 71. Hepatitis C blood test. Hepatitis B blood test. Sexually transmitted disease (STD) testing. Diabetes screening. This is done by checking your blood sugar (glucose) after you have not eaten for a while (fasting). You may have this done every 1-3 years. Bone density scan. This is done to screen for osteoporosis. You may have this done starting at age 71. Mammogram. This may be done every  1-2  years. Talk to your health care provider about how often you should have regular mammograms. Talk with your health care provider about your test results, treatment options, and if necessary, the need for more tests. Vaccines  Your health care provider may recommend certain vaccines, such as: Influenza vaccine. This is recommended every year. Tetanus, diphtheria, and acellular pertussis (Tdap, Td) vaccine. You may need a Td booster every 10 years. Zoster vaccine. You may need this after age 71. Pneumococcal 13-valent conjugate (PCV13) vaccine. One dose is recommended after age 71. Pneumococcal polysaccharide (PPSV23) vaccine. One dose is recommended after age 71. Talk to your health care provider about which screenings and vaccines you need and how often you need them. This information is not intended to replace advice given to you by your health care provider. Make sure you discuss any questions you have with your health care provider. Document Released: 01/09/2016 Document Revised: 09/01/2016 Document Reviewed: 10/14/2015 Elsevier Interactive Patient Education  2017 [employer] Prevention in the Home Falls can cause injuries. They can happen to people of all ages. There are many things you can do to make your home safe and to help prevent falls. What can I do on the outside of my home? Regularly fix the edges of walkways and driveways and fix any cracks. Remove anything that might make you trip as you walk through a door, such as a raised step or threshold. Trim any bushes or trees on the path to your home. Use bright outdoor lighting. Clear any walking paths of anything that might make someone trip, such as rocks or tools. Regularly check to see if handrails are loose or broken. Make sure that both sides of any steps have handrails. Any raised decks and porches should have guardrails on the edges. Have any leaves, snow, or ice cleared regularly. Use sand or salt on walking paths  during winter. Clean up any spills in your garage right away. This includes oil or grease spills. What can I do in the bathroom? Use night lights. Install grab bars by the toilet and in the tub and shower. Do not use towel bars as grab bars. Use non-skid mats or decals in the tub or shower. If you need to sit down in the shower, use a plastic, non-slip stool. Keep the floor dry. Clean up any water that spills on the floor as soon as it happens. Remove soap buildup in the tub or shower regularly. Attach bath mats securely with double-sided non-slip rug tape. Do not have throw rugs and other things on the floor that can make you trip. What can I do in the bedroom? Use night lights. Make sure that you have a light by your bed that is easy to reach. Do not use any sheets or blankets that are too big for your bed. They should not hang down onto the floor. Have a firm chair that has side arms. You can use this for support while you get dressed. Do not have throw rugs and other things on the floor that can make you trip. What can I do in the kitchen? Clean up any spills right away. Avoid walking on wet floors. Keep items that you use a lot in easy-to-reach places. If you need to reach something above you, use a strong step stool that has a grab bar. Keep electrical cords out of the way. Do not use floor polish or wax that makes floors slippery. If you must use wax, use non-skid  floor wax. Do not have throw rugs and other things on the floor that can make you trip. What can I do with my stairs? Do not leave any items on the stairs. Make sure that there are handrails on both sides of the stairs and use them. Fix handrails that are broken or loose. Make sure that handrails are as long as the stairways. Check any carpeting to make sure that it is firmly attached to the stairs. Fix any carpet that is loose or worn. Avoid having throw rugs at the top or bottom of the stairs. If you do have throw  rugs, attach them to the floor with carpet tape. Make sure that you have a light switch at the top of the stairs and the bottom of the stairs. If you do not have them, ask someone to add them for you. What else can I do to help prevent falls? Wear shoes that: Do not have high heels. Have rubber bottoms. Are comfortable and fit you well. Are closed at the toe. Do not wear sandals. If you use a stepladder: Make sure that it is fully opened. Do not climb a closed stepladder. Make sure that both sides of the stepladder are locked into place. Ask someone to hold it for you, if possible. Clearly mark and make sure that you can see: Any grab bars or handrails. First and last steps. Where the edge of each step is. Use tools that help you move around (mobility aids) if they are needed. These include: Canes. Walkers. Scooters. Crutches. Turn on the lights when you go into a dark area. Replace any light bulbs as soon as they burn out. Set up your furniture so you have a clear path. Avoid moving your furniture around. If any of your floors are uneven, fix them. If there are any pets around you, be aware of where they are. Review your medicines with your doctor. Some medicines can make you feel dizzy. This can increase your chance of falling. Ask your doctor what other things that you can do to help prevent falls. This information is not intended to replace advice given to you by your health care provider. Make sure you discuss any questions you have with your health care provider. Document Released: 10/09/2009 Document Revised: 05/20/2016 Document Reviewed: 01/17/2015 Elsevier Interactive Patient Education  2017 [employer].

## 2022-03-01 ENCOUNTER — Telehealth: Payer: Self-pay

## 2022-03-01 NOTE — Chronic Care Management (AMB) (Signed)
? ? ?  Chronic Care Management ?Pharmacy Assistant  ? ?Name: Gabrielle [name]  MRN: [id number] DOB: [birth year] ? ?Gabrielle [name] is an 71 y.o. year old female who presents for his follow-up CCM visit with the clinical pharmacist. ? ?Reason for Encounter: Disease State-General Call ?  ? ?Recent office visits:  ?02/22/22 [name], MD-PCP (Iron Deficiency) No orders or medication changes ? ?12/14/21 [name], MD-Internal Medicine (Gastroesophageal reflux) Referral to Gastroenterology and Neurology, no medication changes  ? ?10/20/21 [name], MD-Internal Medicine (Retina Hemorrhage) Home sleep test, no med changes ? ?Recent consult visits:  ?11/13/21 [name], MD-Plastic Surgery (Skin problem) No orders or med changes ?11/09/21 Rankin, Clent Demark, MD-Ophthalmology (Retina)  ?  ?Hospital visits:  ?Medication Reconciliation was completed by comparing discharge summary, patient?s EMR and Pharmacy list, and upon discussion with patient. ? ?Admitted to the hospital on 12/23/21 due to Headache. Discharge date was 12/23/21. Discharged from [facility] ED ? ?New?Medications Started at [facility] Discharge:?? ?-started Nirmatrelvir-Ritonavir  ? ? ?Medications that remain the same after Hospital Discharge:??  ?-All other medications will remain the same.   ? ?Medications: ?Outpatient Encounter Medications as of 03/01/2022  ?Medication Sig Note  ? ALPRAZolam (XANAX) 1 MG tablet TAKE 1 TABLET BY MOUTH THREE TIMES A DAY   ? baclofen (LIORESAL) 10 MG tablet Take 1 tablet by mouth as needed.   ? carboxymethylcellulose (REFRESH PLUS) 0.5 % SOLN Place 1 drop into both eyes 3 (three) times daily as needed (dry eyes).   ? esomeprazole (NEXIUM) 40 MG capsule Take 40 mg by mouth daily at 12 noon.   ? Ferrous Sulfate 140 (45 Fe) MG TBCR Take 140 mg by mouth daily. 09/04/2021: Hasn't received from the [place].   ? latanoprost (XALATAN) 0.005 % ophthalmic solution 1 drop at bedtime. (Patient not  taking: Reported on 02/22/2022)   ? lidocaine (XYLOCAINE) 2 % solution Use as directed 15 mLs in the mouth or throat every 4 (four) hours as needed for mouth pain.   ? Multiple Vitamin (MULTIVITAMIN WITH MINERALS) TABS tablet Take 1 tablet by mouth daily.   ? ondansetron (ZOFRAN ODT) 4 MG disintegrating tablet '4mg'$  ODT q4 hours prn nausea/vomit   ? VITAMIN D PO Take 1 tablet by mouth daily.   ? zolpidem (AMBIEN) 10 MG tablet Take 1 tablet (10 mg total) by mouth at bedtime as needed for sleep.   ? ?No facility-administered encounter medications on file as of 03/01/2022.  ? ?Have you had any problems recently with your health?Patient states that she is doing well. She states that Dr. Sharlet Salina is monitoring her blood pressure but has not changed any of her meds yet ? ?Have you had any problems with your pharmacy?Patient states she does not have a problem with getting her medications ? ?What issues or side effects are you having with your medications?Patient states that she does not have any side effects from medications ? ?What would you like me to pass along to [facility] for them to help you with? Patient states that she is doing well ? ?What can we do to take care of you better? Patient states that she does not have anymore refills for Ambien. Told her to check with Dr. Sharlet Salina for refills ? ?Care Gaps: ?Colonoscopy-10/30/18 ?Diabetic Foot Exam-NA ?Mammogram-05/04/21 ?Ophthalmology-NA ?Dexa Scan - NA ?Annual Well Visit -  ?Micro albumin-NA ?Hemoglobin A1c- 07/29/21 ? ?Star Rating Drugs: ?None ID ? ? ?Ethelene Hal ?Clinical Pharmacist Assistant ?[phone]  ?

## 2022-03-02 DIAGNOSIS — H903 Sensorineural hearing loss, bilateral: Secondary | ICD-10-CM | POA: Diagnosis not present

## 2022-03-02 DIAGNOSIS — H9313 Tinnitus, bilateral: Secondary | ICD-10-CM | POA: Diagnosis not present

## 2022-03-03 ENCOUNTER — Other Ambulatory Visit: Payer: Self-pay

## 2022-03-03 ENCOUNTER — Ambulatory Visit (INDEPENDENT_AMBULATORY_CARE_PROVIDER_SITE_OTHER): Payer: PPO

## 2022-03-03 VITALS — BP 140/80

## 2022-03-03 DIAGNOSIS — R030 Elevated blood-pressure reading, without diagnosis of hypertension: Secondary | ICD-10-CM | POA: Diagnosis not present

## 2022-03-03 NOTE — Progress Notes (Signed)
Pt was seen for BO check on nurse sched per PCP Dr. Sharlet Salina. ?

## 2022-03-04 ENCOUNTER — Encounter: Payer: Self-pay | Admitting: Internal Medicine

## 2022-03-04 ENCOUNTER — Telehealth (INDEPENDENT_AMBULATORY_CARE_PROVIDER_SITE_OTHER): Payer: PPO | Admitting: Internal Medicine

## 2022-03-04 DIAGNOSIS — R030 Elevated blood-pressure reading, without diagnosis of hypertension: Secondary | ICD-10-CM

## 2022-03-04 NOTE — Assessment & Plan Note (Signed)
BP appears to be borderline to controlled with appropriate sized cuff. We will continue with close monitoring with weekly BP check at the office next 1-2 weeks. If persistently borderline will plan to start valsartan 40 mg daily.  ?

## 2022-03-04 NOTE — Progress Notes (Signed)
Virtual Visit via Audio Note ? ?I connected with Gabrielle [name] on 03/04/22 at 11:00 AM EST by an audio- only enabled telemedicine application and verified that I am speaking with the correct person using two identifiers. ? ?The patient and the provider were at separate locations throughout the entire encounter. Patient location: home, Provider location: work ?  ?I discussed the limitations of evaluation and management by telemedicine and the availability of in person appointments. The patient expressed understanding and agreed to proceed. The patient and the provider were the only parties present for the visit unless noted in HPI below. ? ?History of Present Illness: The patient is a 71 y.o. female with visit for blood pressure.  ? ?BP 140/80 yesterday in office ? ?Observations/Objective: A and O times 3, no cough or dyspnea ? ?Assessment and Plan: See problem oriented charting ? ?Follow Up Instructions: F/U 1 week for BP check ? ?Visit time 12 minutes in non-face to face communication with patient. ? ?I discussed the assessment and treatment plan with the patient. The patient was provided an opportunity to ask questions and all were answered. The patient agreed with the plan and demonstrated an understanding of the instructions. ?  ?The patient was advised to call back or seek an in-person evaluation if the symptoms worsen or if the condition fails to improve as anticipated. ? ?[name], MD ? ?

## 2022-03-08 ENCOUNTER — Ambulatory Visit: Payer: Non-veteran care

## 2022-03-10 DIAGNOSIS — H401111 Primary open-angle glaucoma, right eye, mild stage: Secondary | ICD-10-CM | POA: Diagnosis not present

## 2022-03-10 DIAGNOSIS — H16223 Keratoconjunctivitis sicca, not specified as Sjogren's, bilateral: Secondary | ICD-10-CM | POA: Diagnosis not present

## 2022-03-10 DIAGNOSIS — D3132 Benign neoplasm of left choroid: Secondary | ICD-10-CM | POA: Diagnosis not present

## 2022-03-10 DIAGNOSIS — H0288B Meibomian gland dysfunction left eye, upper and lower eyelids: Secondary | ICD-10-CM | POA: Diagnosis not present

## 2022-03-10 DIAGNOSIS — Z961 Presence of intraocular lens: Secondary | ICD-10-CM | POA: Diagnosis not present

## 2022-03-10 DIAGNOSIS — H40053 Ocular hypertension, bilateral: Secondary | ICD-10-CM | POA: Diagnosis not present

## 2022-03-10 DIAGNOSIS — H02822 Cysts of right lower eyelid: Secondary | ICD-10-CM | POA: Diagnosis not present

## 2022-03-10 DIAGNOSIS — H0288A Meibomian gland dysfunction right eye, upper and lower eyelids: Secondary | ICD-10-CM | POA: Diagnosis not present

## 2022-03-10 DIAGNOSIS — H47012 Ischemic optic neuropathy, left eye: Secondary | ICD-10-CM | POA: Diagnosis not present

## 2022-03-10 DIAGNOSIS — H11823 Conjunctivochalasis, bilateral: Secondary | ICD-10-CM | POA: Diagnosis not present

## 2022-03-10 DIAGNOSIS — H43813 Vitreous degeneration, bilateral: Secondary | ICD-10-CM | POA: Diagnosis not present

## 2022-03-11 ENCOUNTER — Ambulatory Visit: Payer: Non-veteran care

## 2022-03-11 ENCOUNTER — Encounter: Payer: Self-pay | Admitting: Internal Medicine

## 2022-03-11 ENCOUNTER — Ambulatory Visit (INDEPENDENT_AMBULATORY_CARE_PROVIDER_SITE_OTHER): Payer: PPO | Admitting: Internal Medicine

## 2022-03-11 ENCOUNTER — Other Ambulatory Visit: Payer: Self-pay

## 2022-03-11 VITALS — BP 128/74 | HR 66 | Resp 18 | Ht 64.0 in | Wt 215.0 lb

## 2022-03-11 DIAGNOSIS — R002 Palpitations: Secondary | ICD-10-CM

## 2022-03-11 DIAGNOSIS — F4312 Post-traumatic stress disorder, chronic: Secondary | ICD-10-CM | POA: Diagnosis not present

## 2022-03-11 DIAGNOSIS — F5104 Psychophysiologic insomnia: Secondary | ICD-10-CM | POA: Diagnosis not present

## 2022-03-11 DIAGNOSIS — I10 Essential (primary) hypertension: Secondary | ICD-10-CM

## 2022-03-11 DIAGNOSIS — E611 Iron deficiency: Secondary | ICD-10-CM | POA: Diagnosis not present

## 2022-03-11 MED ORDER — ZOLPIDEM TARTRATE 10 MG PO TABS: 10.0000 mg | ORAL_TABLET | Freq: Every evening | ORAL | 5 refills | Status: DC | PRN

## 2022-03-11 NOTE — Patient Instructions (Signed)
We will have you monitor the palpitations and if still having more let us know. ? ? ?

## 2022-03-11 NOTE — Progress Notes (Signed)
? ?  Subjective:  ? ?Patient ID: Gabrielle [name], female    DOB: 27-Jun-1951, 71 y.o.   MRN: [id number] ? ?HPI ?The patient is a 71 YO female coming in for palpitations. ? ?Review of Systems  ?Constitutional: Negative.   ?HENT: Negative.    ?Eyes: Negative.   ?Respiratory:  Negative for cough, chest tightness and shortness of breath.   ?Cardiovascular:  Negative for chest pain, palpitations and leg swelling.  ?Gastrointestinal:  Negative for abdominal distention, abdominal pain, constipation, diarrhea, nausea and vomiting.  ?Musculoskeletal: Negative.   ?Skin: Negative.   ?Neurological: Negative.   ?Psychiatric/Behavioral: Negative.    ? ?Objective:  ?Physical Exam ?Constitutional:   ?   Appearance: She is well-developed.  ?HENT:  ?   Head: Normocephalic and atraumatic.  ?Cardiovascular:  ?   Rate and Rhythm: Normal rate and regular rhythm.  ?Pulmonary:  ?   Effort: Pulmonary effort is normal. No respiratory distress.  ?   Breath sounds: Normal breath sounds. No wheezing or rales.  ?Abdominal:  ?   General: Bowel sounds are normal. There is no distension.  ?   Palpations: Abdomen is soft.  ?   Tenderness: There is no abdominal tenderness. There is no rebound.  ?Musculoskeletal:  ?   Cervical back: Normal range of motion.  ?Skin: ?   General: Skin is warm and dry.  ?Neurological:  ?   Mental Status: She is alert and oriented to person, place, and time.  ?   Coordination: Coordination normal.  ? ? ?Vitals:  ? 03/11/22 1009  ?BP: 132/88  ?Pulse: 66  ?Resp: 18  ?SpO2: 98%  ?Weight: 215 lb (97.5 kg)  ?Height: '5\' 4"'$  (1.626 m)  ? ?EKG: Rate 59, axis normal, interval normal, sinus, no st or t wave changes, no significant change compared to prior 11/2021 ? ? ?This visit occurred during the SARS-CoV-2 public health emergency.  Safety protocols were in place, including screening questions prior to the visit, additional usage of staff PPE, and extensive cleaning of exam room while observing appropriate contact time as indicated for  disinfecting solutions.  ? ?Assessment & Plan:  ? ?

## 2022-03-12 DIAGNOSIS — R002 Palpitations: Secondary | ICD-10-CM | POA: Insufficient documentation

## 2022-03-12 NOTE — Assessment & Plan Note (Signed)
She is currently maintained off blood pressure medication. Using thigh cuff she has had normal pressure readings several times in a row at our office. I would not recommend blood pressure medication at this time. She is exceptionally sensitive to medications and has had many adverse reactions. She will work on diet and exercise to maintain healthy BP readings.  ?

## 2022-03-12 NOTE — Assessment & Plan Note (Signed)
EKG done today which is unchanged from prior and no PAC/PVC on the EKG. She was not having palpitations during EKG. This is likely medication reaction from 2 new eye medications. She has stopped both due to other side effects related to her vision. We will monitor over the next 1-2 weeks. If no improvement we will order holter monitoring. Reviewed recent labs from [place] with CBC and ferritin and iron sat which will be abstracted.  ?

## 2022-03-12 NOTE — Assessment & Plan Note (Signed)
This is triggered lately with the increase in palpitations as well as changes in her eye medications as her eye surgery/loss of vision is a source of trauma. She is still using alprazolam and is working on reducing dosage. She was able to stop altogether in the past and that is her goal again. ?

## 2022-03-12 NOTE — Assessment & Plan Note (Signed)
Given her recent palpitations she has had increased difficulty sleeping. She is still using ambien 10 mg qhs for sleep. She does need refill. She is aware of the risk of falls given her concurrent xanax and does risk to proceed given quality of life from sleep.  ?

## 2022-03-12 NOTE — Assessment & Plan Note (Signed)
She now has normal blood counts with recent HG 12.1 from [place] but persistent iron deficiency with ferritin around 7. She is taking oral vegan iron which is not causing GI problems. She has no signs of clinical bleeding and we discussed that IV iron treatments can cause side effects which she does not want to pursue and instead we will continue oral iron daily and gradually refill her tank of iron stores.  ?

## 2022-03-15 DIAGNOSIS — H6123 Impacted cerumen, bilateral: Secondary | ICD-10-CM | POA: Diagnosis not present

## 2022-03-15 DIAGNOSIS — Z87891 Personal history of nicotine dependence: Secondary | ICD-10-CM | POA: Diagnosis not present

## 2022-03-15 DIAGNOSIS — H903 Sensorineural hearing loss, bilateral: Secondary | ICD-10-CM | POA: Diagnosis not present

## 2022-03-16 ENCOUNTER — Ambulatory Visit: Payer: Non-veteran care | Admitting: Internal Medicine

## 2022-03-30 DIAGNOSIS — H401111 Primary open-angle glaucoma, right eye, mild stage: Secondary | ICD-10-CM | POA: Diagnosis not present

## 2022-03-31 DIAGNOSIS — R8281 Pyuria: Secondary | ICD-10-CM | POA: Diagnosis not present

## 2022-03-31 DIAGNOSIS — H9319 Tinnitus, unspecified ear: Secondary | ICD-10-CM | POA: Diagnosis not present

## 2022-03-31 DIAGNOSIS — E669 Obesity, unspecified: Secondary | ICD-10-CM | POA: Diagnosis not present

## 2022-03-31 DIAGNOSIS — I10 Essential (primary) hypertension: Secondary | ICD-10-CM | POA: Diagnosis not present

## 2022-04-19 ENCOUNTER — Other Ambulatory Visit: Payer: Self-pay | Admitting: Internal Medicine

## 2022-04-20 ENCOUNTER — Telehealth: Payer: Self-pay

## 2022-04-20 ENCOUNTER — Telehealth: Payer: Self-pay | Admitting: Internal Medicine

## 2022-04-20 ENCOUNTER — Ambulatory Visit: Payer: PPO | Admitting: Internal Medicine

## 2022-04-20 MED ORDER — CETIRIZINE HCL 10 MG PO TABS: 10.0000 mg | ORAL_TABLET | Freq: Every day | ORAL | 0 refills | Status: DC

## 2022-04-20 NOTE — Telephone Encounter (Signed)
Spoke with the pt and she is ok with being prescribed something different for her congestion.  ?

## 2022-04-20 NOTE — Telephone Encounter (Signed)
Refill has been sent to the pt's pharmacy  

## 2022-04-20 NOTE — Telephone Encounter (Signed)
Pt states she has congestion and inquiring if provider can prescribe a medication that was prescribed when pt was in hospital  ? ?Pt states medicine is called "hydromet" ? ?Pt requesting a cb w/ provider's response ? ? ?

## 2022-04-20 NOTE — Telephone Encounter (Signed)
Pt is requesting a refill on: ?esomeprazole (NEXIUM) 40 MG capsule ? ?Pharmacy: ?[id number]- [place], [place] - [address] AT [address]? ?LOV 03/11/22 ?

## 2022-04-20 NOTE — Telephone Encounter (Signed)
Sent in zyrtec to take for congestion.  ?

## 2022-04-21 DIAGNOSIS — H11823 Conjunctivochalasis, bilateral: Secondary | ICD-10-CM | POA: Diagnosis not present

## 2022-04-21 DIAGNOSIS — H0288B Meibomian gland dysfunction left eye, upper and lower eyelids: Secondary | ICD-10-CM | POA: Diagnosis not present

## 2022-04-21 DIAGNOSIS — H02822 Cysts of right lower eyelid: Secondary | ICD-10-CM | POA: Diagnosis not present

## 2022-04-21 DIAGNOSIS — H401111 Primary open-angle glaucoma, right eye, mild stage: Secondary | ICD-10-CM | POA: Diagnosis not present

## 2022-04-21 DIAGNOSIS — H43813 Vitreous degeneration, bilateral: Secondary | ICD-10-CM | POA: Diagnosis not present

## 2022-04-21 DIAGNOSIS — H47012 Ischemic optic neuropathy, left eye: Secondary | ICD-10-CM | POA: Diagnosis not present

## 2022-04-21 DIAGNOSIS — H40053 Ocular hypertension, bilateral: Secondary | ICD-10-CM | POA: Diagnosis not present

## 2022-04-21 DIAGNOSIS — H0288A Meibomian gland dysfunction right eye, upper and lower eyelids: Secondary | ICD-10-CM | POA: Diagnosis not present

## 2022-04-21 DIAGNOSIS — Z961 Presence of intraocular lens: Secondary | ICD-10-CM | POA: Diagnosis not present

## 2022-04-21 DIAGNOSIS — D3132 Benign neoplasm of left choroid: Secondary | ICD-10-CM | POA: Diagnosis not present

## 2022-05-10 ENCOUNTER — Encounter (INDEPENDENT_AMBULATORY_CARE_PROVIDER_SITE_OTHER): Payer: PPO | Admitting: Ophthalmology

## 2022-05-10 DIAGNOSIS — Z1231 Encounter for screening mammogram for malignant neoplasm of breast: Secondary | ICD-10-CM | POA: Diagnosis not present

## 2022-05-12 ENCOUNTER — Telehealth: Payer: Non-veteran care
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
## 2022-05-19 DIAGNOSIS — H47292 Other optic atrophy, left eye: Secondary | ICD-10-CM | POA: Diagnosis not present

## 2022-05-19 DIAGNOSIS — H04123 Dry eye syndrome of bilateral lacrimal glands: Secondary | ICD-10-CM | POA: Diagnosis not present

## 2022-05-19 DIAGNOSIS — Z961 Presence of intraocular lens: Secondary | ICD-10-CM | POA: Diagnosis not present

## 2022-05-19 DIAGNOSIS — H02112 Cicatricial ectropion of right lower eyelid: Secondary | ICD-10-CM | POA: Diagnosis not present

## 2022-05-19 DIAGNOSIS — H40021 Open angle with borderline findings, high risk, right eye: Secondary | ICD-10-CM | POA: Diagnosis not present

## 2022-05-19 DIAGNOSIS — H02115 Cicatricial ectropion of left lower eyelid: Secondary | ICD-10-CM | POA: Diagnosis not present

## 2022-05-26 ENCOUNTER — Telehealth: Payer: Self-pay

## 2022-05-26 NOTE — Telephone Encounter (Signed)
Pt is calling wanting to reschedule her pharmacy that was missed.  Please advise

## 2022-05-26 NOTE — Progress Notes (Signed)
Spoke with patient and rescheduled appt for 05/27/22 at 1pm with clinical pharmacist  [place] Pharmacist Assistant [phone]

## 2022-05-27 ENCOUNTER — Telehealth: Payer: Self-pay | Admitting: Internal Medicine

## 2022-05-27 ENCOUNTER — Ambulatory Visit: Payer: PPO

## 2022-05-27 DIAGNOSIS — E611 Iron deficiency: Secondary | ICD-10-CM

## 2022-05-27 DIAGNOSIS — G4700 Insomnia, unspecified: Secondary | ICD-10-CM

## 2022-05-27 DIAGNOSIS — F411 Generalized anxiety disorder: Secondary | ICD-10-CM

## 2022-05-27 NOTE — Patient Instructions (Signed)
Visit Information  Following are the goals we discussed today:   Manage My Medicine   Timeframe:  Long-Range Goal Priority:  Medium Start Date:      03/23/21                       Expected End Date:     05/2023                Follow Up Date 11/2022   - call for medicine refill 2 or 3 days before it runs out - call if I am sick and can't take my medicine - keep a list of all the medicines I take; vitamins and herbals too  -Discuss medication alternatives with psychiatrist - if desired    Why is this important?   These steps will help you keep on track with your medicines.  Plan: Telephone follow up appointment with care management team member scheduled for:  6 months The patient has been provided with contact information for the care management team and has been advised to call with any health related questions or concerns.   Tomasa Blase, PharmD Clinical Pharmacist, [facility]   Please call the care guide team at [phone] if you need to cancel or reschedule your appointment.   Patient verbalizes understanding of instructions and care plan provided today and agrees to view in [place]. Active MyChart status and patient understanding of how to access instructions and care plan via MyChart confirmed with patient.
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
## 2022-06-04 DIAGNOSIS — H903 Sensorineural hearing loss, bilateral: Secondary | ICD-10-CM | POA: Diagnosis not present

## 2022-06-04 DIAGNOSIS — H9313 Tinnitus, bilateral: Secondary | ICD-10-CM | POA: Diagnosis not present

## 2022-06-04 DIAGNOSIS — H6123 Impacted cerumen, bilateral: Secondary | ICD-10-CM | POA: Diagnosis not present

## 2022-06-28 ENCOUNTER — Ambulatory Visit: Payer: PPO | Admitting: Internal Medicine

## 2022-07-05 ENCOUNTER — Ambulatory Visit (INDEPENDENT_AMBULATORY_CARE_PROVIDER_SITE_OTHER): Payer: PPO | Admitting: Internal Medicine

## 2022-07-05 ENCOUNTER — Encounter: Payer: Self-pay | Admitting: Internal Medicine

## 2022-07-05 DIAGNOSIS — F4312 Post-traumatic stress disorder, chronic: Secondary | ICD-10-CM

## 2022-07-05 MED ORDER — ALPRAZOLAM 1 MG PO TABS: 1.0000 mg | ORAL_TABLET | Freq: Three times a day (TID) | ORAL | 5 refills | Status: DC

## 2022-07-05 NOTE — Assessment & Plan Note (Signed)
Referral to healthy weight and wellness clinic. She has done nutritionist through [place] and this has not helped her at all. She is motivated for change.

## 2022-07-05 NOTE — Progress Notes (Signed)
   Subjective:   Patient ID: Gabrielle [name], female    DOB: Apr 05, 1951, 71 y.o.   MRN: [id number]  HPI The patient is a 71 YO female coming in for concerns about weight.   Review of Systems  Constitutional: Negative.   HENT: Negative.    Eyes: Negative.   Respiratory:  Negative for cough, chest tightness and shortness of breath.   Cardiovascular:  Negative for chest pain, palpitations and leg swelling.  Gastrointestinal:  Negative for abdominal distention, abdominal pain, constipation, diarrhea, nausea and vomiting.  Musculoskeletal: Negative.   Skin: Negative.   Neurological: Negative.   Psychiatric/Behavioral: Negative.      Objective:  Physical Exam Constitutional:      Appearance: She is well-developed. She is obese.  HENT:     Head: Normocephalic and atraumatic.  Cardiovascular:     Rate and Rhythm: Normal rate and regular rhythm.  Pulmonary:     Effort: Pulmonary effort is normal. No respiratory distress.     Breath sounds: Normal breath sounds. No wheezing or rales.  Abdominal:     General: Bowel sounds are normal. There is no distension.     Palpations: Abdomen is soft.     Tenderness: There is no abdominal tenderness. There is no rebound.  Musculoskeletal:     Cervical back: Normal range of motion.  Skin:    General: Skin is warm and dry.  Neurological:     Mental Status: She is alert and oriented to person, place, and time.     Coordination: Coordination normal.     Vitals:   07/05/22 1403  BP: 130/74  Pulse: 68  Resp: 18  SpO2: 96%  Weight: 223 lb 3.2 oz (101.2 kg)  Height: '5\' 4"'$  (1.626 m)    Assessment & Plan:  Visit time 15 minutes in face to face communication with patient and coordination of care, additional 5 minutes spent in record review, coordination or care, ordering tests, communicating/referring to other healthcare professionals, documenting in medical records all on the same day of the visit for total time 20 minutes spent on the visit.

## 2022-07-05 NOTE — Assessment & Plan Note (Signed)
Needs refill today on xanax 1 mg TID which is done today.

## 2022-07-05 NOTE — Addendum Note (Signed)
Addended by: Pricilla Holm A on: 07/05/2022 03:13 PM   Modules accepted: Orders

## 2022-07-05 NOTE — Patient Instructions (Signed)
We will get you in with the weight center to help you figure out the diet.

## 2022-07-06 ENCOUNTER — Other Ambulatory Visit: Payer: Self-pay

## 2022-07-06 DIAGNOSIS — F4312 Post-traumatic stress disorder, chronic: Secondary | ICD-10-CM

## 2022-07-06 MED ORDER — ALPRAZOLAM 1 MG PO TABS: 1.0000 mg | ORAL_TABLET | Freq: Three times a day (TID) | ORAL | 5 refills | Status: DC

## 2022-07-20 ENCOUNTER — Telehealth: Payer: Self-pay | Admitting: Internal Medicine

## 2022-07-20 NOTE — Telephone Encounter (Signed)
Patient was told at [facility] that she has fibromyalgia  - she would like to know if Dr. Sharlet Salina was aware of this because she has never been told that she had fibromyalgia before.

## 2022-07-20 NOTE — Telephone Encounter (Signed)
Fibromyalgia is a clinical diagnosis so we can talk about if this is accurate next visit.

## 2022-07-26 DIAGNOSIS — H02822 Cysts of right lower eyelid: Secondary | ICD-10-CM | POA: Diagnosis not present

## 2022-07-26 DIAGNOSIS — H0288B Meibomian gland dysfunction left eye, upper and lower eyelids: Secondary | ICD-10-CM | POA: Diagnosis not present

## 2022-07-26 DIAGNOSIS — H43813 Vitreous degeneration, bilateral: Secondary | ICD-10-CM | POA: Diagnosis not present

## 2022-07-26 DIAGNOSIS — H40052 Ocular hypertension, left eye: Secondary | ICD-10-CM | POA: Diagnosis not present

## 2022-07-26 DIAGNOSIS — D3132 Benign neoplasm of left choroid: Secondary | ICD-10-CM | POA: Diagnosis not present

## 2022-07-26 DIAGNOSIS — Z961 Presence of intraocular lens: Secondary | ICD-10-CM | POA: Diagnosis not present

## 2022-07-26 DIAGNOSIS — H11823 Conjunctivochalasis, bilateral: Secondary | ICD-10-CM | POA: Diagnosis not present

## 2022-07-26 DIAGNOSIS — H0288A Meibomian gland dysfunction right eye, upper and lower eyelids: Secondary | ICD-10-CM | POA: Diagnosis not present

## 2022-07-26 DIAGNOSIS — H16223 Keratoconjunctivitis sicca, not specified as Sjogren's, bilateral: Secondary | ICD-10-CM | POA: Diagnosis not present

## 2022-07-26 DIAGNOSIS — H47012 Ischemic optic neuropathy, left eye: Secondary | ICD-10-CM | POA: Diagnosis not present

## 2022-07-26 DIAGNOSIS — H401111 Primary open-angle glaucoma, right eye, mild stage: Secondary | ICD-10-CM | POA: Diagnosis not present

## 2022-07-30 ENCOUNTER — Telehealth: Payer: PPO

## 2022-08-18 DIAGNOSIS — M85851 Other specified disorders of bone density and structure, right thigh: Secondary | ICD-10-CM | POA: Diagnosis not present

## 2022-08-18 LAB — HM DEXA SCAN

## 2022-08-31 ENCOUNTER — Ambulatory Visit (INDEPENDENT_AMBULATORY_CARE_PROVIDER_SITE_OTHER): Payer: PPO | Admitting: Internal Medicine

## 2022-08-31 ENCOUNTER — Encounter: Payer: Self-pay | Admitting: Internal Medicine

## 2022-08-31 VITALS — BP 122/84 | HR 74 | Temp 98.2°F | Ht 64.0 in | Wt 223.0 lb

## 2022-08-31 DIAGNOSIS — R8271 Bacteriuria: Secondary | ICD-10-CM | POA: Insufficient documentation

## 2022-08-31 DIAGNOSIS — F419 Anxiety disorder, unspecified: Secondary | ICD-10-CM

## 2022-08-31 DIAGNOSIS — K5904 Chronic idiopathic constipation: Secondary | ICD-10-CM

## 2022-08-31 DIAGNOSIS — Z741 Need for assistance with personal care: Secondary | ICD-10-CM | POA: Insufficient documentation

## 2022-08-31 DIAGNOSIS — F4321 Adjustment disorder with depressed mood: Secondary | ICD-10-CM | POA: Insufficient documentation

## 2022-08-31 DIAGNOSIS — Z0000 Encounter for general adult medical examination without abnormal findings: Secondary | ICD-10-CM | POA: Diagnosis not present

## 2022-08-31 DIAGNOSIS — R1310 Dysphagia, unspecified: Secondary | ICD-10-CM | POA: Insufficient documentation

## 2022-08-31 DIAGNOSIS — K2100 Gastro-esophageal reflux disease with esophagitis, without bleeding: Secondary | ICD-10-CM | POA: Diagnosis not present

## 2022-08-31 DIAGNOSIS — D508 Other iron deficiency anemias: Secondary | ICD-10-CM | POA: Insufficient documentation

## 2022-08-31 DIAGNOSIS — R630 Anorexia: Secondary | ICD-10-CM | POA: Insufficient documentation

## 2022-08-31 DIAGNOSIS — K922 Gastrointestinal hemorrhage, unspecified: Secondary | ICD-10-CM | POA: Insufficient documentation

## 2022-08-31 DIAGNOSIS — R030 Elevated blood-pressure reading, without diagnosis of hypertension: Secondary | ICD-10-CM | POA: Diagnosis not present

## 2022-08-31 DIAGNOSIS — N760 Acute vaginitis: Secondary | ICD-10-CM | POA: Insufficient documentation

## 2022-08-31 DIAGNOSIS — Z634 Disappearance and death of family member: Secondary | ICD-10-CM | POA: Insufficient documentation

## 2022-08-31 DIAGNOSIS — I340 Nonrheumatic mitral (valve) insufficiency: Secondary | ICD-10-CM | POA: Insufficient documentation

## 2022-08-31 DIAGNOSIS — M21619 Bunion of unspecified foot: Secondary | ICD-10-CM | POA: Insufficient documentation

## 2022-08-31 DIAGNOSIS — Z461 Encounter for fitting and adjustment of hearing aid: Secondary | ICD-10-CM | POA: Insufficient documentation

## 2022-08-31 DIAGNOSIS — F5104 Psychophysiologic insomnia: Secondary | ICD-10-CM | POA: Diagnosis not present

## 2022-08-31 DIAGNOSIS — F32A Depression, unspecified: Secondary | ICD-10-CM

## 2022-08-31 DIAGNOSIS — F603 Borderline personality disorder: Secondary | ICD-10-CM | POA: Insufficient documentation

## 2022-08-31 DIAGNOSIS — R634 Abnormal weight loss: Secondary | ICD-10-CM | POA: Insufficient documentation

## 2022-08-31 DIAGNOSIS — R9431 Abnormal electrocardiogram [ECG] [EKG]: Secondary | ICD-10-CM | POA: Insufficient documentation

## 2022-08-31 DIAGNOSIS — K5900 Constipation, unspecified: Secondary | ICD-10-CM | POA: Insufficient documentation

## 2022-08-31 DIAGNOSIS — H409 Unspecified glaucoma: Secondary | ICD-10-CM | POA: Insufficient documentation

## 2022-08-31 DIAGNOSIS — R5383 Other fatigue: Secondary | ICD-10-CM | POA: Insufficient documentation

## 2022-08-31 DIAGNOSIS — R531 Weakness: Secondary | ICD-10-CM | POA: Insufficient documentation

## 2022-08-31 DIAGNOSIS — M25562 Pain in left knee: Secondary | ICD-10-CM | POA: Insufficient documentation

## 2022-08-31 MED ORDER — ZOLPIDEM TARTRATE 10 MG PO TABS: 10.0000 mg | ORAL_TABLET | Freq: Every evening | ORAL | 5 refills | Status: DC | PRN

## 2022-08-31 NOTE — Patient Instructions (Signed)
We have refilled the ambien. We will check on the bone density.

## 2022-08-31 NOTE — Progress Notes (Signed)
   Subjective:   Patient ID: Gabrielle [name], female    DOB: 1951/11/30, 71 y.o.   MRN: [id number]  HPI The patient is a 71 YO female coming in for physical.  PMH, [place], social history reviewed and updated  Review of Systems  Constitutional: Negative.   HENT:  Positive for tinnitus.   Eyes:  Positive for visual disturbance.  Respiratory:  Negative for cough, chest tightness and shortness of breath.   Cardiovascular:  Negative for chest pain, palpitations and leg swelling.  Gastrointestinal:  Negative for abdominal distention, abdominal pain, constipation, diarrhea, nausea and vomiting.       GERD  Musculoskeletal: Negative.   Skin: Negative.   Neurological: Negative.   Psychiatric/Behavioral: Negative.      Objective:  Physical Exam Constitutional:      Appearance: She is well-developed. She is obese.  HENT:     Head: Normocephalic and atraumatic.  Cardiovascular:     Rate and Rhythm: Normal rate and regular rhythm.  Pulmonary:     Effort: Pulmonary effort is normal. No respiratory distress.     Breath sounds: Normal breath sounds. No wheezing or rales.  Abdominal:     General: Bowel sounds are normal. There is no distension.     Palpations: Abdomen is soft.     Tenderness: There is no abdominal tenderness. There is no rebound.  Musculoskeletal:     Cervical back: Normal range of motion.  Skin:    General: Skin is warm and dry.  Neurological:     Mental Status: She is alert and oriented to person, place, and time.     Coordination: Coordination normal.     Vitals:   08/31/22 1401  BP: 122/84  Pulse: 74  Temp: 98.2 F (36.8 C)  TempSrc: Oral  SpO2: 96%  Weight: 223 lb (101.2 kg)  Height: '5\' 4"'$  (1.626 m)    Assessment & Plan:

## 2022-09-01 ENCOUNTER — Encounter: Payer: Self-pay | Admitting: Internal Medicine

## 2022-09-01 NOTE — Assessment & Plan Note (Signed)
Takes ambien 10 mg qhs and refilled today as due.

## 2022-09-01 NOTE — Assessment & Plan Note (Signed)
Flu shot yearly. Covid-19 counseled. Pneumonia declines. Shingrix declines. Tetanus up to date. Colonoscopy up to date. Mammogram up to date, pap smear aged out and dexa complete. Counseled about sun safety and mole surveillance. Counseled about the dangers of distracted driving. Given 10 year screening recommendations.

## 2022-09-01 NOTE — Assessment & Plan Note (Signed)
Still struggles with this. Uses prune juice but not regularly. Asked her to do this daily and see if this helps. She is drinking plenty of water. Asked to increase exercise as well.

## 2022-09-01 NOTE — Assessment & Plan Note (Signed)
BP normal today and continue monitoring at every visit.

## 2022-09-01 NOTE — Assessment & Plan Note (Signed)
Still having breakthrough symptoms. We discussed she can use nexium 40 mg BID for flares for 1-2 weeks then return to nexium 40 mg daily long term dosing.

## 2022-09-01 NOTE — Assessment & Plan Note (Signed)
Still doing counseling and working on coping skills. Uses xanax 1 mg TID. Has had significant side effects of SSRI/SNRI treatment and does not want to try again at this time.

## 2022-09-07 ENCOUNTER — Telehealth: Payer: Self-pay | Admitting: Internal Medicine

## 2022-09-07 DIAGNOSIS — H9313 Tinnitus, bilateral: Secondary | ICD-10-CM | POA: Diagnosis not present

## 2022-09-07 DIAGNOSIS — L03011 Cellulitis of right finger: Secondary | ICD-10-CM | POA: Diagnosis not present

## 2022-09-07 DIAGNOSIS — B351 Tinea unguium: Secondary | ICD-10-CM | POA: Diagnosis not present

## 2022-09-07 DIAGNOSIS — L603 Nail dystrophy: Secondary | ICD-10-CM | POA: Diagnosis not present

## 2022-09-07 DIAGNOSIS — H903 Sensorineural hearing loss, bilateral: Secondary | ICD-10-CM | POA: Diagnosis not present

## 2022-09-07 NOTE — Telephone Encounter (Signed)
I see this is abstracted into her chart but the record is not available. I don't see it in media either. If we have a copy I can interpret results.

## 2022-09-07 NOTE — Telephone Encounter (Signed)
Patient would the results of her bone density test.  Please give her a call

## 2022-09-07 NOTE — Telephone Encounter (Signed)
Please advise 

## 2022-09-08 DIAGNOSIS — H40052 Ocular hypertension, left eye: Secondary | ICD-10-CM | POA: Diagnosis not present

## 2022-09-13 DIAGNOSIS — H11823 Conjunctivochalasis, bilateral: Secondary | ICD-10-CM | POA: Diagnosis not present

## 2022-09-13 DIAGNOSIS — H02822 Cysts of right lower eyelid: Secondary | ICD-10-CM | POA: Diagnosis not present

## 2022-09-13 DIAGNOSIS — H0288B Meibomian gland dysfunction left eye, upper and lower eyelids: Secondary | ICD-10-CM | POA: Diagnosis not present

## 2022-09-13 DIAGNOSIS — H0288A Meibomian gland dysfunction right eye, upper and lower eyelids: Secondary | ICD-10-CM | POA: Diagnosis not present

## 2022-09-13 DIAGNOSIS — H16223 Keratoconjunctivitis sicca, not specified as Sjogren's, bilateral: Secondary | ICD-10-CM | POA: Diagnosis not present

## 2022-09-15 DIAGNOSIS — Z961 Presence of intraocular lens: Secondary | ICD-10-CM | POA: Diagnosis not present

## 2022-09-15 DIAGNOSIS — H40021 Open angle with borderline findings, high risk, right eye: Secondary | ICD-10-CM | POA: Diagnosis not present

## 2022-09-15 DIAGNOSIS — H04123 Dry eye syndrome of bilateral lacrimal glands: Secondary | ICD-10-CM | POA: Diagnosis not present

## 2022-09-15 DIAGNOSIS — H47292 Other optic atrophy, left eye: Secondary | ICD-10-CM | POA: Diagnosis not present

## 2022-09-22 DIAGNOSIS — H0288A Meibomian gland dysfunction right eye, upper and lower eyelids: Secondary | ICD-10-CM | POA: Diagnosis not present

## 2022-09-22 DIAGNOSIS — H401111 Primary open-angle glaucoma, right eye, mild stage: Secondary | ICD-10-CM | POA: Diagnosis not present

## 2022-09-22 DIAGNOSIS — D3132 Benign neoplasm of left choroid: Secondary | ICD-10-CM | POA: Diagnosis not present

## 2022-09-22 DIAGNOSIS — H11823 Conjunctivochalasis, bilateral: Secondary | ICD-10-CM | POA: Diagnosis not present

## 2022-09-22 DIAGNOSIS — H40052 Ocular hypertension, left eye: Secondary | ICD-10-CM | POA: Diagnosis not present

## 2022-09-22 DIAGNOSIS — H02822 Cysts of right lower eyelid: Secondary | ICD-10-CM | POA: Diagnosis not present

## 2022-09-22 DIAGNOSIS — H43813 Vitreous degeneration, bilateral: Secondary | ICD-10-CM | POA: Diagnosis not present

## 2022-09-22 DIAGNOSIS — Z961 Presence of intraocular lens: Secondary | ICD-10-CM | POA: Diagnosis not present

## 2022-09-22 DIAGNOSIS — H0288B Meibomian gland dysfunction left eye, upper and lower eyelids: Secondary | ICD-10-CM | POA: Diagnosis not present

## 2022-09-22 DIAGNOSIS — H47012 Ischemic optic neuropathy, left eye: Secondary | ICD-10-CM | POA: Diagnosis not present

## 2022-09-30 ENCOUNTER — Telehealth: Payer: Self-pay | Admitting: Internal Medicine

## 2022-09-30 NOTE — Telephone Encounter (Signed)
Patient is requesting a call back about her bone density exam, she said she had done with [facility]. Her call back is [phone]

## 2022-09-30 NOTE — Telephone Encounter (Signed)
Patient still does not have the results of her bone density - can we please check into this.

## 2022-09-30 NOTE — Telephone Encounter (Signed)
Bone density results placed Dr. Sharlet Salina box.

## 2022-10-04 ENCOUNTER — Ambulatory Visit (INDEPENDENT_AMBULATORY_CARE_PROVIDER_SITE_OTHER): Payer: PPO | Admitting: *Deleted

## 2022-10-04 VITALS — BP 132/76

## 2022-10-04 DIAGNOSIS — Z23 Encounter for immunization: Secondary | ICD-10-CM

## 2022-10-04 NOTE — Progress Notes (Signed)
Patient here for her flu shot as well as her BP check  Flu shot given in left deltoid. Patient tolerated well  Patient BP during nurse visit is 132/76.

## 2022-10-07 DIAGNOSIS — T162XXA Foreign body in left ear, initial encounter: Secondary | ICD-10-CM | POA: Diagnosis not present

## 2022-10-08 ENCOUNTER — Ambulatory Visit: Payer: PPO

## 2022-10-12 ENCOUNTER — Telehealth: Payer: Self-pay | Admitting: Internal Medicine

## 2022-10-13 ENCOUNTER — Ambulatory Visit
Admission: RE | Admit: 2022-10-13 | Discharge: 2022-10-13 | Disposition: A | Discharge status: Home / Self Care | Payer: PPO | Source: Ambulatory Visit | Attending: Internal Medicine | Admitting: Internal Medicine

## 2022-10-13 ENCOUNTER — Encounter: Payer: Self-pay | Admitting: Internal Medicine

## 2022-10-13 ENCOUNTER — Other Ambulatory Visit: Payer: PPO

## 2022-10-13 ENCOUNTER — Ambulatory Visit (INDEPENDENT_AMBULATORY_CARE_PROVIDER_SITE_OTHER): Payer: PPO | Admitting: Internal Medicine

## 2022-10-13 VITALS — BP 150/80 | HR 56 | Temp 98.4°F | Ht 64.0 in | Wt 223.0 lb

## 2022-10-13 DIAGNOSIS — H93293 Other abnormal auditory perceptions, bilateral: Secondary | ICD-10-CM | POA: Diagnosis not present

## 2022-10-13 DIAGNOSIS — R430 Anosmia: Secondary | ICD-10-CM

## 2022-10-13 DIAGNOSIS — H5462 Unqualified visual loss, left eye, normal vision right eye: Secondary | ICD-10-CM | POA: Diagnosis not present

## 2022-10-13 DIAGNOSIS — H906 Mixed conductive and sensorineural hearing loss, bilateral: Secondary | ICD-10-CM

## 2022-10-13 DIAGNOSIS — H9319 Tinnitus, unspecified ear: Secondary | ICD-10-CM | POA: Diagnosis not present

## 2022-10-13 DIAGNOSIS — R439 Unspecified disturbances of smell and taste: Secondary | ICD-10-CM | POA: Diagnosis not present

## 2022-10-13 NOTE — Progress Notes (Signed)
   Subjective:   Patient ID: Gabrielle [name], female    DOB: Sep 27, 1951, 71 y.o.   MRN: [id number]  HPI The patient is a 71 YO female coming in for tinnitus worse over the last week since dental procedure.  Review of Systems  Constitutional: Negative.   HENT:  Positive for tinnitus.   Eyes: Negative.   Respiratory:  Negative for cough, chest tightness and shortness of breath.   Cardiovascular:  Negative for chest pain, palpitations and leg swelling.  Gastrointestinal:  Negative for abdominal distention, abdominal pain, constipation, diarrhea, nausea and vomiting.  Musculoskeletal: Negative.   Skin: Negative.   Neurological: Negative.   Psychiatric/Behavioral: Negative.      Objective:  Physical Exam Constitutional:      Appearance: She is well-developed.  HENT:     Head: Normocephalic and atraumatic.  Cardiovascular:     Rate and Rhythm: Normal rate and regular rhythm.  Pulmonary:     Effort: Pulmonary effort is normal. No respiratory distress.     Breath sounds: Normal breath sounds. No wheezing or rales.  Abdominal:     General: Bowel sounds are normal. There is no distension.     Palpations: Abdomen is soft.     Tenderness: There is no abdominal tenderness. There is no rebound.  Musculoskeletal:     Cervical back: Normal range of motion.  Skin:    General: Skin is warm and dry.  Neurological:     Mental Status: She is alert and oriented to person, place, and time.     Coordination: Coordination normal.     Vitals:   10/13/22 1020 10/13/22 1027  BP: (!) 160/80 (!) 150/80  Pulse: (!) 56   Temp: 98.4 F (36.9 C)   TempSrc: Oral   SpO2: 98%   Weight: 223 lb (101.2 kg)   Height: '5\' 4"'$  (1.626 m)     Assessment & Plan:

## 2022-10-13 NOTE — Patient Instructions (Signed)
We will get the scan of the head/brain.

## 2022-10-13 NOTE — Assessment & Plan Note (Signed)
Has worsening tinnitus after dental procedure and concern for damage/inflammation. Ordered CT head/sinuses to assess. She is seeing ENT later this week.

## 2022-10-15 ENCOUNTER — Other Ambulatory Visit: Payer: Self-pay | Admitting: Internal Medicine

## 2022-10-15 ENCOUNTER — Telehealth: Payer: Self-pay | Admitting: Internal Medicine

## 2022-10-15 DIAGNOSIS — H93A3 Pulsatile tinnitus, bilateral: Secondary | ICD-10-CM | POA: Diagnosis not present

## 2022-10-15 NOTE — Telephone Encounter (Signed)
Pt stated--have the CD to bring to Dr. Constance Holster for the imaging.

## 2022-10-15 NOTE — Telephone Encounter (Signed)
PT calls today in regards to recent CT imaging done. PT is requesting the imagine results for the CT of the head 10/18 and CT maxillofacial be sent to the ENT specialist Avel Peace MD at the [facility].  CB if needed: [phone]

## 2022-10-20 DIAGNOSIS — Z961 Presence of intraocular lens: Secondary | ICD-10-CM | POA: Diagnosis not present

## 2022-10-20 DIAGNOSIS — H16223 Keratoconjunctivitis sicca, not specified as Sjogren's, bilateral: Secondary | ICD-10-CM | POA: Diagnosis not present

## 2022-10-20 DIAGNOSIS — H0288B Meibomian gland dysfunction left eye, upper and lower eyelids: Secondary | ICD-10-CM | POA: Diagnosis not present

## 2022-10-20 DIAGNOSIS — H401111 Primary open-angle glaucoma, right eye, mild stage: Secondary | ICD-10-CM | POA: Diagnosis not present

## 2022-10-20 DIAGNOSIS — H47012 Ischemic optic neuropathy, left eye: Secondary | ICD-10-CM | POA: Diagnosis not present

## 2022-10-20 DIAGNOSIS — H0288A Meibomian gland dysfunction right eye, upper and lower eyelids: Secondary | ICD-10-CM | POA: Diagnosis not present

## 2022-10-20 DIAGNOSIS — H11823 Conjunctivochalasis, bilateral: Secondary | ICD-10-CM | POA: Diagnosis not present

## 2022-10-20 DIAGNOSIS — D3132 Benign neoplasm of left choroid: Secondary | ICD-10-CM | POA: Diagnosis not present

## 2022-10-20 DIAGNOSIS — H43813 Vitreous degeneration, bilateral: Secondary | ICD-10-CM | POA: Diagnosis not present

## 2022-10-20 DIAGNOSIS — H40052 Ocular hypertension, left eye: Secondary | ICD-10-CM | POA: Diagnosis not present

## 2022-10-20 DIAGNOSIS — H02822 Cysts of right lower eyelid: Secondary | ICD-10-CM | POA: Diagnosis not present

## 2022-11-02 ENCOUNTER — Telehealth: Payer: Self-pay | Admitting: Internal Medicine

## 2022-11-02 DIAGNOSIS — H5789 Other specified disorders of eye and adnexa: Secondary | ICD-10-CM

## 2022-11-02 DIAGNOSIS — H02103 Unspecified ectropion of right eye, unspecified eyelid: Secondary | ICD-10-CM

## 2022-11-02 NOTE — Telephone Encounter (Signed)
Patient would like a referral to  Dr. Gevena Cotton at [address].  Fax referral to:  [phone]

## 2022-11-03 NOTE — Telephone Encounter (Signed)
We would need to know the reason for the referral

## 2022-11-04 NOTE — Telephone Encounter (Signed)
Spoke with patient and informed her about her referral let her know that will contact her.

## 2022-11-04 NOTE — Telephone Encounter (Signed)
Referral placed.

## 2022-11-10 ENCOUNTER — Telehealth: Payer: Self-pay | Admitting: Internal Medicine

## 2022-11-10 DIAGNOSIS — H9313 Tinnitus, bilateral: Secondary | ICD-10-CM

## 2022-11-10 NOTE — Telephone Encounter (Signed)
Referral placed.

## 2022-11-10 NOTE — Telephone Encounter (Signed)
Spoke with the pt and was able to inform her of Dr. Nathanial Millman advice. Pt states she would a referral to a neurologist as she does not see the one she was going to previously.

## 2022-11-10 NOTE — Telephone Encounter (Signed)
Patient called and requesting a call back about her cat scan, she said she was concerned about one of the findings. Call back is [phone]

## 2022-11-10 NOTE — Telephone Encounter (Signed)
It does not seem likely since this the tinnitus has been going on for a long time and this is not a new finding per the radiologist. She could ask her ENT or neurologist to see if they think it is related.

## 2022-11-10 NOTE — Telephone Encounter (Signed)
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 Pt states she suffers from tinnitus and is wanting to know if this is the cause and can there be something done to help or or does she need to go to be seen by a specialist as she did some research and it stated this can be related to her having tinnitus.

## 2022-11-11 ENCOUNTER — Telehealth: Payer: Self-pay | Admitting: Internal Medicine

## 2022-11-11 NOTE — Telephone Encounter (Signed)
Pt called to request  NEW NEUROLOGY REFERRAL  Pt does not want to go to [facility] Pt does not want to go to [place].  Please refer to a different neurology group  Pt ph [phone]

## 2022-11-11 NOTE — Telephone Encounter (Signed)
Notified pt..Per chart ref. Was faxed on 11/04/22 to Dr. Frederico Hamman office. Printed referral refaxed to [phone].Marland KitchenJohny Chess

## 2022-11-11 NOTE — Telephone Encounter (Signed)
The referral to the ophthalmologist - Gevena Cotton - was not received by that office - can this referral please be sent again - Patient says this is an urgent referral.

## 2022-11-11 NOTE — Telephone Encounter (Signed)
She will need to tell us where she wants to go then. All other groups would be with another health system so she can let us know which is in her network.

## 2022-11-12 NOTE — Telephone Encounter (Signed)
Patient called back and asked about Dr Estanislado Pandy, she saud he's a neuro radiology doctor. Call back for patient is [phone]

## 2022-11-12 NOTE — Telephone Encounter (Signed)
Which one? There are two. One is a doctor that treats aneurysms and 1 is a rheumatologist so I am not sure either would be able to help her. She would need a general neurologist likely for what she is wanting.

## 2022-11-15 DIAGNOSIS — H02136 Senile ectropion of left eye, unspecified eyelid: Secondary | ICD-10-CM | POA: Diagnosis not present

## 2022-11-15 DIAGNOSIS — H40003 Preglaucoma, unspecified, bilateral: Secondary | ICD-10-CM | POA: Diagnosis not present

## 2022-11-15 DIAGNOSIS — H468 Other optic neuritis: Secondary | ICD-10-CM | POA: Diagnosis not present

## 2022-11-15 DIAGNOSIS — H02823 Cysts of right eye, unspecified eyelid: Secondary | ICD-10-CM | POA: Diagnosis not present

## 2022-11-17 NOTE — Telephone Encounter (Signed)
Patient will speak with her insurance company to help find a neuro per her prefence

## 2022-11-17 NOTE — Progress Notes (Signed)
Patient  has an upcoming eye appointment 12/22/2022 with [facility]

## 2022-12-01 ENCOUNTER — Ambulatory Visit: Payer: PPO | Admitting: Neurology

## 2022-12-02 ENCOUNTER — Telehealth: Payer: Self-pay | Admitting: Internal Medicine

## 2022-12-02 DIAGNOSIS — H93A3 Pulsatile tinnitus, bilateral: Secondary | ICD-10-CM

## 2022-12-02 NOTE — Telephone Encounter (Signed)
Informed patient about her referral and mentioned that she can give them a call prior to them reaching out to her.

## 2022-12-02 NOTE — Telephone Encounter (Signed)
Patient called and said that she has found a neurologist that she wants to go to.  Dr. Lavell Anchors.  She is at [facility] in [place]  Fax:  [phone]  Please let patient know when referral is made:  [phone]

## 2022-12-02 NOTE — Telephone Encounter (Signed)
Referral placed.

## 2022-12-06 DIAGNOSIS — H47012 Ischemic optic neuropathy, left eye: Secondary | ICD-10-CM | POA: Diagnosis not present

## 2022-12-06 DIAGNOSIS — H40052 Ocular hypertension, left eye: Secondary | ICD-10-CM | POA: Diagnosis not present

## 2022-12-06 DIAGNOSIS — H43813 Vitreous degeneration, bilateral: Secondary | ICD-10-CM | POA: Diagnosis not present

## 2022-12-06 DIAGNOSIS — H02822 Cysts of right lower eyelid: Secondary | ICD-10-CM | POA: Diagnosis not present

## 2022-12-06 DIAGNOSIS — H0288B Meibomian gland dysfunction left eye, upper and lower eyelids: Secondary | ICD-10-CM | POA: Diagnosis not present

## 2022-12-06 DIAGNOSIS — H11823 Conjunctivochalasis, bilateral: Secondary | ICD-10-CM | POA: Diagnosis not present

## 2022-12-06 DIAGNOSIS — D3132 Benign neoplasm of left choroid: Secondary | ICD-10-CM | POA: Diagnosis not present

## 2022-12-06 DIAGNOSIS — Z961 Presence of intraocular lens: Secondary | ICD-10-CM | POA: Diagnosis not present

## 2022-12-06 DIAGNOSIS — H401111 Primary open-angle glaucoma, right eye, mild stage: Secondary | ICD-10-CM | POA: Diagnosis not present

## 2022-12-06 DIAGNOSIS — H0288A Meibomian gland dysfunction right eye, upper and lower eyelids: Secondary | ICD-10-CM | POA: Diagnosis not present

## 2022-12-07 DIAGNOSIS — H93A3 Pulsatile tinnitus, bilateral: Secondary | ICD-10-CM | POA: Diagnosis not present

## 2022-12-08 ENCOUNTER — Telehealth: Payer: Self-pay

## 2022-12-08 NOTE — Telephone Encounter (Signed)
Patient has concerns about her blood pressure but will be coming in next week to talk with Dr Sharlet Salina about it

## 2022-12-15 ENCOUNTER — Encounter: Payer: Self-pay | Admitting: Internal Medicine

## 2022-12-15 ENCOUNTER — Ambulatory Visit (INDEPENDENT_AMBULATORY_CARE_PROVIDER_SITE_OTHER): Payer: PPO | Admitting: Internal Medicine

## 2022-12-15 VITALS — BP 145/78 | HR 60 | Temp 98.0°F | Ht 64.0 in | Wt 224.0 lb

## 2022-12-15 DIAGNOSIS — H93A3 Pulsatile tinnitus, bilateral: Secondary | ICD-10-CM

## 2022-12-15 NOTE — Patient Instructions (Addendum)
When the neurologist calls if they are unable to see you that is okay.

## 2022-12-15 NOTE — Progress Notes (Unsigned)
   Subjective:   Patient ID: Gabrielle [name], female    DOB: 03/11/1951, 71 y.o.   MRN: [id number]  HPI The patient is a 71 YO female coming in for discussion about recent CT results.   Review of Systems  Constitutional: Negative.   HENT:  Positive for tinnitus.   Eyes:  Positive for visual disturbance.  Respiratory:  Negative for cough, chest tightness and shortness of breath.   Cardiovascular:  Negative for chest pain, palpitations and leg swelling.  Gastrointestinal:  Negative for abdominal distention, abdominal pain, constipation, diarrhea, nausea and vomiting.  Musculoskeletal: Negative.   Skin: Negative.   Neurological: Negative.   Psychiatric/Behavioral:  Positive for dysphoric mood and sleep disturbance.     Objective:  Physical Exam Constitutional:      Appearance: She is well-developed.  HENT:     Head: Normocephalic and atraumatic.  Cardiovascular:     Rate and Rhythm: Normal rate and regular rhythm.  Pulmonary:     Effort: Pulmonary effort is normal. No respiratory distress.     Breath sounds: Normal breath sounds. No wheezing or rales.  Abdominal:     General: Bowel sounds are normal. There is no distension.     Palpations: Abdomen is soft.     Tenderness: There is no abdominal tenderness. There is no rebound.  Musculoskeletal:     Cervical back: Normal range of motion.  Skin:    General: Skin is warm and dry.  Neurological:     Mental Status: She is alert and oriented to person, place, and time.     Coordination: Coordination normal.     Vitals:   12/15/22 1031 12/15/22 1049 12/15/22 1104  BP: (!) 180/100 (!) 180/100 (!) 145/78  Pulse: 60    Temp: 98 F (36.7 C)    TempSrc: Oral    SpO2: 98%    Weight: 224 lb (101.6 kg)    Height: '5\' 4"'$  (1.626 m)      Assessment & Plan:  Visit time 20 minutes in face to face communication with patient and coordination of care, additional 5 minutes spent in record review, coordination or care, ordering tests,  communicating/referring to other healthcare professionals, documenting in medical records all on the same day of the visit for total time 25 minutes spent on the visit.

## 2022-12-16 NOTE — Assessment & Plan Note (Signed)
Discussed results of CT head and sinuses and how this may or may not impact the tinnitus.

## 2023-01-03 DIAGNOSIS — H0288B Meibomian gland dysfunction left eye, upper and lower eyelids: Secondary | ICD-10-CM | POA: Diagnosis not present

## 2023-01-03 DIAGNOSIS — D3132 Benign neoplasm of left choroid: Secondary | ICD-10-CM | POA: Diagnosis not present

## 2023-01-03 DIAGNOSIS — H43813 Vitreous degeneration, bilateral: Secondary | ICD-10-CM | POA: Diagnosis not present

## 2023-01-03 DIAGNOSIS — H02822 Cysts of right lower eyelid: Secondary | ICD-10-CM | POA: Diagnosis not present

## 2023-01-03 DIAGNOSIS — H40052 Ocular hypertension, left eye: Secondary | ICD-10-CM | POA: Diagnosis not present

## 2023-01-03 DIAGNOSIS — Z961 Presence of intraocular lens: Secondary | ICD-10-CM | POA: Diagnosis not present

## 2023-01-03 DIAGNOSIS — H47012 Ischemic optic neuropathy, left eye: Secondary | ICD-10-CM | POA: Diagnosis not present

## 2023-01-03 DIAGNOSIS — H401111 Primary open-angle glaucoma, right eye, mild stage: Secondary | ICD-10-CM | POA: Diagnosis not present

## 2023-01-03 DIAGNOSIS — H0288A Meibomian gland dysfunction right eye, upper and lower eyelids: Secondary | ICD-10-CM | POA: Diagnosis not present

## 2023-01-03 DIAGNOSIS — H11823 Conjunctivochalasis, bilateral: Secondary | ICD-10-CM | POA: Diagnosis not present

## 2023-01-14 DIAGNOSIS — H4720 Unspecified optic atrophy: Secondary | ICD-10-CM | POA: Diagnosis not present

## 2023-01-14 DIAGNOSIS — H40023 Open angle with borderline findings, high risk, bilateral: Secondary | ICD-10-CM | POA: Diagnosis not present

## 2023-01-18 ENCOUNTER — Telehealth: Payer: Self-pay | Admitting: Internal Medicine

## 2023-01-18 NOTE — Telephone Encounter (Signed)
Patient called wanting to know how she should proceed with scheduling her follow up. She wants to know if it should be a physical or if she should schedule to do a check on her blood pressure. She would like a callback at [phone].

## 2023-01-18 NOTE — Telephone Encounter (Signed)
If not due for physical this would just be an office visit.

## 2023-01-18 NOTE — Telephone Encounter (Signed)
Patient has concerns about her blood pressure and has been scheduled for a visit.

## 2023-01-23 ENCOUNTER — Other Ambulatory Visit: Payer: Self-pay | Admitting: Internal Medicine

## 2023-01-23 DIAGNOSIS — F4312 Post-traumatic stress disorder, chronic: Secondary | ICD-10-CM

## 2023-01-24 ENCOUNTER — Telehealth: Payer: Self-pay | Admitting: Internal Medicine

## 2023-01-24 ENCOUNTER — Other Ambulatory Visit: Payer: Self-pay

## 2023-01-24 DIAGNOSIS — F4312 Post-traumatic stress disorder, chronic: Secondary | ICD-10-CM

## 2023-01-24 MED ORDER — ALPRAZOLAM 1 MG PO TABS: 1.0000 mg | ORAL_TABLET | Freq: Three times a day (TID) | ORAL | 5 refills | Status: DC

## 2023-01-24 NOTE — Telephone Encounter (Signed)
Patient called states she needs a refill of her alprazolam '1mg'$ , as she only has 2 left.

## 2023-01-24 NOTE — Telephone Encounter (Signed)
I have routed this to Dr Sharlet Salina

## 2023-01-28 ENCOUNTER — Encounter: Payer: Self-pay | Admitting: Internal Medicine

## 2023-01-28 ENCOUNTER — Ambulatory Visit (INDEPENDENT_AMBULATORY_CARE_PROVIDER_SITE_OTHER): Payer: PPO | Admitting: Internal Medicine

## 2023-01-28 VITALS — BP 138/84 | HR 65 | Temp 97.5°F | Ht 64.0 in | Wt 225.0 lb

## 2023-01-28 DIAGNOSIS — K2100 Gastro-esophageal reflux disease with esophagitis, without bleeding: Secondary | ICD-10-CM | POA: Diagnosis not present

## 2023-01-28 DIAGNOSIS — R030 Elevated blood-pressure reading, without diagnosis of hypertension: Secondary | ICD-10-CM | POA: Diagnosis not present

## 2023-01-28 NOTE — Assessment & Plan Note (Signed)
Using nexium 40 mg BID and sucralfate oral as needed which does help. Asking about dicyclomine and worries about side effect with glaucoma. I do not think this would help the burning in her esophagus so we decided through shared decision making not to prescribe.

## 2023-01-28 NOTE — Progress Notes (Signed)
   Subjective:   Patient ID: Gabrielle [name], female    DOB: 28-Dec-1950, 72 y.o.   MRN: [id number]  HPI The patient is a 72 YO female coming in for follow up.  Review of Systems  Constitutional: Negative.   HENT:  Positive for tinnitus.   Eyes:  Positive for visual disturbance.  Respiratory:  Negative for cough, chest tightness and shortness of breath.   Cardiovascular:  Negative for chest pain, palpitations and leg swelling.  Gastrointestinal:  Negative for abdominal distention, abdominal pain, constipation, diarrhea, nausea and vomiting.  Musculoskeletal: Negative.   Skin: Negative.   Neurological: Negative.   Psychiatric/Behavioral: Negative.      Objective:  Physical Exam Constitutional:      Appearance: She is well-developed.  HENT:     Head: Normocephalic and atraumatic.  Cardiovascular:     Rate and Rhythm: Normal rate and regular rhythm.  Pulmonary:     Effort: Pulmonary effort is normal. No respiratory distress.     Breath sounds: Normal breath sounds. No wheezing or rales.  Abdominal:     General: Bowel sounds are normal. There is no distension.     Palpations: Abdomen is soft.     Tenderness: There is no abdominal tenderness. There is no rebound.  Musculoskeletal:     Cervical back: Normal range of motion.  Skin:    General: Skin is warm and dry.  Neurological:     Mental Status: She is alert and oriented to person, place, and time.     Coordination: Coordination normal.     Vitals:   01/28/23 1354 01/28/23 1418  BP: (!) 160/100 138/84  Pulse: 65   Temp: (!) 97.5 F (36.4 C)   TempSrc: Oral   SpO2: 98%   Weight: 225 lb (102.1 kg)   Height: '5\' 4"'$  (1.626 m)     Assessment & Plan:

## 2023-01-28 NOTE — Assessment & Plan Note (Signed)
BP is normal after 10-15 minutes rest. We discussed average BP reading at goal of <150/90 at our office. She does not check at home and should have follow up BP in 2 weeks at the office.

## 2023-02-16 ENCOUNTER — Ambulatory Visit: Payer: PPO

## 2023-02-16 VITALS — BP 158/80

## 2023-02-16 DIAGNOSIS — R030 Elevated blood-pressure reading, without diagnosis of hypertension: Secondary | ICD-10-CM

## 2023-02-23 DIAGNOSIS — H40052 Ocular hypertension, left eye: Secondary | ICD-10-CM | POA: Diagnosis not present

## 2023-02-23 DIAGNOSIS — H47012 Ischemic optic neuropathy, left eye: Secondary | ICD-10-CM | POA: Diagnosis not present

## 2023-02-23 DIAGNOSIS — H0288A Meibomian gland dysfunction right eye, upper and lower eyelids: Secondary | ICD-10-CM | POA: Diagnosis not present

## 2023-02-23 DIAGNOSIS — H11823 Conjunctivochalasis, bilateral: Secondary | ICD-10-CM | POA: Diagnosis not present

## 2023-02-23 DIAGNOSIS — H43813 Vitreous degeneration, bilateral: Secondary | ICD-10-CM | POA: Diagnosis not present

## 2023-02-23 DIAGNOSIS — Z961 Presence of intraocular lens: Secondary | ICD-10-CM | POA: Diagnosis not present

## 2023-02-23 DIAGNOSIS — H401111 Primary open-angle glaucoma, right eye, mild stage: Secondary | ICD-10-CM | POA: Diagnosis not present

## 2023-02-23 DIAGNOSIS — D3132 Benign neoplasm of left choroid: Secondary | ICD-10-CM | POA: Diagnosis not present

## 2023-02-23 DIAGNOSIS — H02822 Cysts of right lower eyelid: Secondary | ICD-10-CM | POA: Diagnosis not present

## 2023-02-23 DIAGNOSIS — H0288B Meibomian gland dysfunction left eye, upper and lower eyelids: Secondary | ICD-10-CM | POA: Diagnosis not present

## 2023-02-26 DIAGNOSIS — J040 Acute laryngitis: Secondary | ICD-10-CM | POA: Diagnosis not present

## 2023-02-26 DIAGNOSIS — J069 Acute upper respiratory infection, unspecified: Secondary | ICD-10-CM | POA: Diagnosis not present

## 2023-02-28 ENCOUNTER — Ambulatory Visit: Payer: PPO | Admitting: Neurology

## 2023-02-28 DIAGNOSIS — H938X3 Other specified disorders of ear, bilateral: Secondary | ICD-10-CM | POA: Diagnosis not present

## 2023-03-01 ENCOUNTER — Telehealth: Payer: Self-pay | Admitting: Internal Medicine

## 2023-03-01 NOTE — Telephone Encounter (Signed)
Patient called requesting a callback from you to discuss what her next steps would be since her blood pressure was high. She wants to know does she need to come back in.  Best callback number is [phone].

## 2023-03-03 NOTE — Telephone Encounter (Signed)
I am confused about this note. I do not see a BP here listed?

## 2023-03-03 NOTE — Telephone Encounter (Signed)
Continue to monitor at home intermittently 3-4 times per month

## 2023-03-03 NOTE — Telephone Encounter (Signed)
Notified pt w/MD response. Pt states she had BP check Tues still high 175/84 after 30 mins recheck it was 170/82. Made appt to f/u on BP.Marland KitchenJohny [name]

## 2023-03-08 DIAGNOSIS — H903 Sensorineural hearing loss, bilateral: Secondary | ICD-10-CM | POA: Diagnosis not present

## 2023-03-08 DIAGNOSIS — H93293 Other abnormal auditory perceptions, bilateral: Secondary | ICD-10-CM | POA: Diagnosis not present

## 2023-03-10 ENCOUNTER — Ambulatory Visit: Payer: PPO | Admitting: Internal Medicine

## 2023-03-15 ENCOUNTER — Ambulatory Visit (INDEPENDENT_AMBULATORY_CARE_PROVIDER_SITE_OTHER): Payer: PPO | Admitting: Internal Medicine

## 2023-03-15 ENCOUNTER — Encounter: Payer: Self-pay | Admitting: Internal Medicine

## 2023-03-15 VITALS — BP 138/84 | HR 62 | Temp 97.7°F | Ht 64.0 in | Wt 225.0 lb

## 2023-03-15 DIAGNOSIS — R030 Elevated blood-pressure reading, without diagnosis of hypertension: Secondary | ICD-10-CM | POA: Diagnosis not present

## 2023-03-15 DIAGNOSIS — R5383 Other fatigue: Secondary | ICD-10-CM | POA: Diagnosis not present

## 2023-03-15 LAB — POCT RESPIRATORY SYNCYTIAL VIRUS: RSV Rapid Ag: NEGATIVE

## 2023-03-15 MED ORDER — AMLODIPINE BESYLATE 5 MG PO TABS: 5.0000 mg | ORAL_TABLET | Freq: Every day | ORAL | 3 refills | Status: DC

## 2023-03-15 NOTE — Assessment & Plan Note (Signed)
POC RSV done as she is unsure if she was exposed and new fatigue. This is negative reassurance given.

## 2023-03-15 NOTE — Patient Instructions (Addendum)
We will recheck the BP in 3-4 weeks and if needed we would try amlodipine.

## 2023-03-15 NOTE — Progress Notes (Unsigned)
   Subjective:   Patient ID: Gabrielle [name], female    DOB: 09-17-1951, 71 y.o.   MRN: [id number]  HPI The patient is a 72 YO female coming in for follow up and fatigue. Recent labs with CBC, iron panels, CMP, magnesium at [facility] normal.   Review of Systems  Constitutional:  Positive for fatigue.  HENT: Negative.    Eyes: Negative.   Respiratory:  Negative for cough, chest tightness and shortness of breath.   Cardiovascular:  Negative for chest pain, palpitations and leg swelling.  Gastrointestinal:  Negative for abdominal distention, abdominal pain, constipation, diarrhea, nausea and vomiting.  Musculoskeletal: Negative.   Skin: Negative.   Neurological: Negative.   Psychiatric/Behavioral: Negative.      Objective:  Physical Exam Constitutional:      Appearance: She is well-developed. She is obese.  HENT:     Head: Normocephalic and atraumatic.  Cardiovascular:     Rate and Rhythm: Normal rate and regular rhythm.  Pulmonary:     Effort: Pulmonary effort is normal. No respiratory distress.     Breath sounds: Normal breath sounds. No wheezing or rales.  Abdominal:     General: Bowel sounds are normal. There is no distension.     Palpations: Abdomen is soft.     Tenderness: There is no abdominal tenderness. There is no rebound.  Musculoskeletal:     Cervical back: Normal range of motion.  Skin:    General: Skin is warm and dry.  Neurological:     Mental Status: She is alert and oriented to person, place, and time.     Coordination: Coordination normal.     Vitals:   03/15/23 1413 03/15/23 1425 03/15/23 1442  BP: (!) 150/92 (!) 140/98 138/84  Pulse: 62    Temp: 97.7 F (36.5 C)    TempSrc: Oral    SpO2: 99%    Weight: 225 lb (102.1 kg)    Height: 5\' 4"  (1.626 m)      Assessment & Plan:  POC RSV negative

## 2023-03-15 NOTE — Assessment & Plan Note (Signed)
We discussed amlodipine 5 mg daily if needed. Repeat BP was normal and we prefer to avoid medications unless needed. She will return for follow up 3-4 weeks and MD will check BP again.

## 2023-03-17 DIAGNOSIS — I10 Essential (primary) hypertension: Secondary | ICD-10-CM | POA: Diagnosis not present

## 2023-03-17 DIAGNOSIS — H9319 Tinnitus, unspecified ear: Secondary | ICD-10-CM | POA: Diagnosis not present

## 2023-03-17 DIAGNOSIS — E669 Obesity, unspecified: Secondary | ICD-10-CM | POA: Diagnosis not present

## 2023-03-17 DIAGNOSIS — R8281 Pyuria: Secondary | ICD-10-CM | POA: Diagnosis not present

## 2023-03-17 LAB — BASIC METABOLIC PANEL
BUN: 19 (ref 4–21)
CO2: 25 — AB (ref 13–22)
Chloride: 106 (ref 99–108)
Creatinine: 1.3 — AB (ref 0.5–1.1)
Glucose: 74
Potassium: 4.1 mEq/L (ref 3.5–5.1)
Sodium: 144 (ref 137–147)

## 2023-03-17 LAB — COMPREHENSIVE METABOLIC PANEL
Albumin: 4.3 (ref 3.5–5.0)
Calcium: 9.1 (ref 8.7–10.7)
eGFR: 43

## 2023-03-17 LAB — PROTEIN / CREATININE RATIO, URINE
Albumin, U: 10.3
Creatinine, Urine: 276.2

## 2023-03-17 LAB — MICROALBUMIN / CREATININE URINE RATIO: Microalb Creat Ratio: 4

## 2023-03-21 LAB — LAB REPORT - SCANNED
EGFR: 43
Microalb Creat Ratio: 4

## 2023-03-28 ENCOUNTER — Other Ambulatory Visit: Payer: Self-pay | Admitting: Internal Medicine

## 2023-03-28 DIAGNOSIS — F5104 Psychophysiologic insomnia: Secondary | ICD-10-CM

## 2023-03-29 ENCOUNTER — Ambulatory Visit (INDEPENDENT_AMBULATORY_CARE_PROVIDER_SITE_OTHER): Payer: PPO | Admitting: Internal Medicine

## 2023-03-29 ENCOUNTER — Encounter: Payer: Self-pay | Admitting: Internal Medicine

## 2023-03-29 VITALS — BP 134/86 | HR 77 | Temp 98.3°F | Ht 64.0 in | Wt 222.0 lb

## 2023-03-29 DIAGNOSIS — R030 Elevated blood-pressure reading, without diagnosis of hypertension: Secondary | ICD-10-CM

## 2023-03-29 DIAGNOSIS — F5104 Psychophysiologic insomnia: Secondary | ICD-10-CM | POA: Diagnosis not present

## 2023-03-29 MED ORDER — ZOLPIDEM TARTRATE 10 MG PO TABS: 10.0000 mg | ORAL_TABLET | Freq: Every evening | ORAL | 5 refills | Status: DC | PRN

## 2023-03-29 NOTE — Patient Instructions (Addendum)
We have sent in the refill of the ambien.  I would hold off on starting any blood pressure medicine as the blood pressure was good today.

## 2023-03-29 NOTE — Progress Notes (Signed)
   Subjective:   Patient ID: Gabrielle [name], female    DOB: 08-Jul-1951, 72 y.o.   MRN: [id number]  HPI The patient is a 72 YO female coming in for BP follow up.  Review of Systems  Constitutional: Negative.   HENT:  Positive for tinnitus.   Eyes:  Positive for visual disturbance.  Respiratory:  Negative for cough, chest tightness and shortness of breath.   Cardiovascular:  Negative for chest pain, palpitations and leg swelling.  Gastrointestinal:  Negative for abdominal distention, abdominal pain, constipation, diarrhea, nausea and vomiting.  Musculoskeletal:  Positive for arthralgias.  Skin: Negative.   Neurological: Negative.   Psychiatric/Behavioral: Negative.      Objective:  Physical Exam Constitutional:      Appearance: She is well-developed.  HENT:     Head: Normocephalic and atraumatic.  Cardiovascular:     Rate and Rhythm: Normal rate and regular rhythm.  Pulmonary:     Effort: Pulmonary effort is normal. No respiratory distress.     Breath sounds: Normal breath sounds. No wheezing or rales.  Abdominal:     General: Bowel sounds are normal. There is no distension.     Palpations: Abdomen is soft.     Tenderness: There is no abdominal tenderness. There is no rebound.  Musculoskeletal:     Cervical back: Normal range of motion.  Skin:    General: Skin is warm and dry.  Neurological:     Mental Status: She is alert and oriented to person, place, and time.     Coordination: Coordination normal.     Vitals:   03/29/23 1325  BP: 134/86  Pulse: 77  Temp: 98.3 F (36.8 C)  TempSrc: Oral  SpO2: 96%  Weight: 222 lb (100.7 kg)  Height: 5\' 4"  (1.626 m)    Assessment & Plan:

## 2023-04-01 NOTE — Assessment & Plan Note (Signed)
Database reviewed and refill done for ambien 10 mg qhs. She is still using as tinnitus makes sleep initiation very difficult.

## 2023-04-01 NOTE — Assessment & Plan Note (Signed)
BP normal today and will follow up with reading in the office 1-2 months. It does make her anxious to check often at home and doubt the accuracy of readings. She does want to avoid medicine if possible. We had previously counseled that amlodipine 2.5 mg daily would be best option if needed.

## 2023-05-02 ENCOUNTER — Telehealth: Payer: Self-pay | Admitting: Internal Medicine

## 2023-05-02 NOTE — Telephone Encounter (Signed)
Patient would like a call back to let her know if she can come with her sister at her sister's appointment at 11:00 so she doesn't have to make two trips. She would like a call back at [phone].

## 2023-05-02 NOTE — Telephone Encounter (Deleted)
Previous note didn't send

## 2023-05-02 NOTE — Telephone Encounter (Signed)
Patient has an appointment tomorrow 05/03/23 at 1:00 for a bp check, but her sister drives her. Her sister also has an appointment tomorrow, but at 11:00. The patient would like to know if she can come in with her sister so that she won't have to come to the office twice. Her sister's name is Dwaine Deter. She would like a call back at [phone].

## 2023-05-03 ENCOUNTER — Encounter: Payer: Self-pay | Admitting: Internal Medicine

## 2023-05-03 ENCOUNTER — Ambulatory Visit (INDEPENDENT_AMBULATORY_CARE_PROVIDER_SITE_OTHER): Payer: PPO | Admitting: Internal Medicine

## 2023-05-03 VITALS — BP 122/84 | HR 64 | Temp 98.1°F | Ht 64.0 in | Wt 220.0 lb

## 2023-05-03 DIAGNOSIS — H93A3 Pulsatile tinnitus, bilateral: Secondary | ICD-10-CM | POA: Diagnosis not present

## 2023-05-03 DIAGNOSIS — R030 Elevated blood-pressure reading, without diagnosis of hypertension: Secondary | ICD-10-CM

## 2023-05-03 NOTE — Telephone Encounter (Signed)
This has been resolved

## 2023-05-03 NOTE — Assessment & Plan Note (Signed)
Has started magnesium supplement and is having mild improvement. Okay to continue. May take 1-2 months for full benefit.

## 2023-05-03 NOTE — Assessment & Plan Note (Signed)
BP normal today and checked BP home cuff which was not accurate for her arm size.

## 2023-05-03 NOTE — Progress Notes (Signed)
   Subjective:   Patient ID: Gabrielle [name], female    DOB: 1951-08-05, 72 y.o.   MRN: [id number]  HPI The patient is a 72 YO female coming in for BP follow up.   Review of Systems  Constitutional: Negative.   HENT: Negative.    Eyes: Negative.   Respiratory:  Negative for cough, chest tightness and shortness of breath.   Cardiovascular:  Negative for chest pain, palpitations and leg swelling.  Gastrointestinal:  Negative for abdominal distention, abdominal pain, constipation, diarrhea, nausea and vomiting.  Musculoskeletal: Negative.   Skin: Negative.   Neurological: Negative.   Psychiatric/Behavioral: Negative.      Objective:  Physical Exam Constitutional:      Appearance: She is well-developed.  HENT:     Head: Normocephalic and atraumatic.  Cardiovascular:     Rate and Rhythm: Normal rate and regular rhythm.  Pulmonary:     Effort: Pulmonary effort is normal. No respiratory distress.     Breath sounds: Normal breath sounds. No wheezing or rales.  Abdominal:     General: Bowel sounds are normal. There is no distension.     Palpations: Abdomen is soft.     Tenderness: There is no abdominal tenderness. There is no rebound.  Musculoskeletal:     Cervical back: Normal range of motion.  Skin:    General: Skin is warm and dry.  Neurological:     Mental Status: She is alert and oriented to person, place, and time.     Coordination: Coordination normal.     Vitals:   05/03/23 1057  BP: 122/84  Pulse: 64  Temp: 98.1 F (36.7 C)  TempSrc: Oral  SpO2: 97%  Weight: 220 lb (99.8 kg)  Height: 5\' 4"  (1.626 m)    Assessment & Plan:  Visit time 15 minutes in face to face communication with patient and coordination of care, additional 5 minutes spent in record review, coordination or care, ordering tests, communicating/referring to other healthcare professionals, documenting in medical records all on the same day of the visit for total time 20 minutes spent on the visit.

## 2023-05-03 NOTE — Patient Instructions (Signed)
The blood pressure looks good. When the oral weight loss medicine comes out we can try that.

## 2023-05-16 DIAGNOSIS — Z1231 Encounter for screening mammogram for malignant neoplasm of breast: Secondary | ICD-10-CM | POA: Diagnosis not present

## 2023-05-16 LAB — HM MAMMOGRAPHY

## 2023-05-18 DIAGNOSIS — H40021 Open angle with borderline findings, high risk, right eye: Secondary | ICD-10-CM | POA: Diagnosis not present

## 2023-05-18 DIAGNOSIS — Z961 Presence of intraocular lens: Secondary | ICD-10-CM | POA: Diagnosis not present

## 2023-05-18 DIAGNOSIS — H02112 Cicatricial ectropion of right lower eyelid: Secondary | ICD-10-CM | POA: Diagnosis not present

## 2023-05-18 DIAGNOSIS — H02115 Cicatricial ectropion of left lower eyelid: Secondary | ICD-10-CM | POA: Diagnosis not present

## 2023-05-18 DIAGNOSIS — H04123 Dry eye syndrome of bilateral lacrimal glands: Secondary | ICD-10-CM | POA: Diagnosis not present

## 2023-05-18 DIAGNOSIS — H47292 Other optic atrophy, left eye: Secondary | ICD-10-CM | POA: Diagnosis not present

## 2023-05-19 NOTE — Progress Notes (Signed)
Pt came in for a BP check. Was done not closed.Closing encounter...Raechel Chute

## 2023-06-03 ENCOUNTER — Telehealth: Payer: Self-pay | Admitting: Internal Medicine

## 2023-06-03 NOTE — Telephone Encounter (Signed)
Patient would like someone to call her concerning something that was on one of her AVS - she states that it says something about abnormal electro cardiograms.  Please call - [phone]

## 2023-06-03 NOTE — Telephone Encounter (Signed)
Called patient back and spoke with her already

## 2023-06-06 ENCOUNTER — Other Ambulatory Visit: Payer: Self-pay

## 2023-06-06 ENCOUNTER — Emergency Department (HOSPITAL_COMMUNITY)
Admission: EM | Admit: 2023-06-06 | Discharge: 2023-06-07 | Disposition: A | Discharge status: Home / Self Care | Payer: No Typology Code available for payment source | Attending: Emergency Medicine | Admitting: Emergency Medicine

## 2023-06-06 DIAGNOSIS — R079 Chest pain, unspecified: Secondary | ICD-10-CM | POA: Diagnosis not present

## 2023-06-06 DIAGNOSIS — R110 Nausea: Secondary | ICD-10-CM | POA: Insufficient documentation

## 2023-06-06 DIAGNOSIS — R0789 Other chest pain: Secondary | ICD-10-CM

## 2023-06-06 DIAGNOSIS — R5383 Other fatigue: Secondary | ICD-10-CM | POA: Diagnosis not present

## 2023-06-06 DIAGNOSIS — R072 Precordial pain: Secondary | ICD-10-CM | POA: Diagnosis present

## 2023-06-06 NOTE — ED Triage Notes (Signed)
Patient arrives with her husband. Husband asked for assistance in the parking due the patient feeling like she was having a heart attack. Patient was assisted to triage. Patient states she has been feeling bad all day and has had little appetite. Patient states around 2100 she started having chest pain and tightness that radiated to her neck and the epigastric region. No blood thinner. No LOC. Rates pain 9/10.

## 2023-06-07 ENCOUNTER — Emergency Department (HOSPITAL_COMMUNITY): Payer: No Typology Code available for payment source

## 2023-06-07 ENCOUNTER — Encounter (HOSPITAL_COMMUNITY): Payer: Self-pay

## 2023-06-07 DIAGNOSIS — R079 Chest pain, unspecified: Secondary | ICD-10-CM | POA: Diagnosis not present

## 2023-06-07 LAB — TROPONIN I (HIGH SENSITIVITY)
Troponin I (High Sensitivity): 4 ng/L (ref <18)
Troponin I (High Sensitivity): 4 ng/L (ref <18)

## 2023-06-07 LAB — BASIC METABOLIC PANEL
Anion gap: 8 (ref 5–15)
BUN: 17 mg/dL (ref 8–23)
CO2: 26 mmol/L (ref 22–32)
Calcium: 8.8 mg/dL — ABNORMAL LOW (ref 8.9–10.3)
Chloride: 104 mmol/L (ref 98–111)
Creatinine, Ser: 0.92 mg/dL (ref 0.44–1.00)
GFR, Estimated: >60 mL/min (ref >60)
Glucose, Bld: 102 mg/dL — ABNORMAL HIGH (ref 70–99)
Potassium: 3.9 mmol/L (ref 3.5–5.1)
Sodium: 138 mmol/L (ref 135–145)

## 2023-06-07 LAB — CBC
HCT: 40.4 % (ref 36.0–46.0)
Hemoglobin: 13 g/dL (ref 12.0–15.0)
MCH: 28.8 pg (ref 26.0–34.0)
MCHC: 32.2 g/dL (ref 30.0–36.0)
MCV: 89.6 fL (ref 80.0–100.0)
Platelets: 144 10*3/uL — ABNORMAL LOW (ref 150–400)
RBC: 4.51 MIL/uL (ref 3.87–5.11)
RDW: 14.4 % (ref 11.5–15.5)
WBC: 6.2 10*3/uL (ref 4.0–10.5)
nRBC: 0 % (ref 0.0–0.2)

## 2023-06-07 LAB — HEPATIC FUNCTION PANEL
ALT: 15 U/L (ref 0–44)
AST: 21 U/L (ref 15–41)
Albumin: 3.4 g/dL — ABNORMAL LOW (ref 3.5–5.0)
Alkaline Phosphatase: 68 U/L (ref 38–126)
Bilirubin, Direct: <0.1 mg/dL (ref 0.0–0.2)
Total Bilirubin: 0.3 mg/dL (ref 0.3–1.2)
Total Protein: 7 g/dL (ref 6.5–8.1)

## 2023-06-07 LAB — LIPASE, BLOOD: Lipase: 47 U/L (ref 11–51)

## 2023-06-07 NOTE — ED Notes (Signed)
Pt changed out into gown and placed into full monitor. Pt provided with warm blanket.

## 2023-06-07 NOTE — ED Provider Notes (Signed)
[place] EMERGENCY DEPARTMENT AT [facility] Provider Note  CSN: [id number] Arrival date & time: 06/06/23 2353  Chief Complaint(s) Chest Pain  HPI Gabrielle [name] is a 72 y.o. female    The history is provided by the patient.  Chest Pain Pain location:  Substernal area Pain quality comment:  Cramping Pain radiates to:  Neck and mid back Pain severity:  Moderate Onset quality:  Gradual Duration:  2 hours Timing:  Constant Progression:  Waxing and waning Chronicity:  Recurrent Context: at rest   Relieved by:  Nothing Worsened by:  Certain positions Associated symptoms: fatigue and nausea   Associated symptoms: no fever, no shortness of breath and no vomiting   Risk factors: obesity   Risk factors: no diabetes mellitus, no high cholesterol, no hypertension and no smoking    Felt like GERD, but antiacids did not help after an hour so she was concerned it might be her heart.  Pain no resolved completely since arriving to the ER.  Past Medical History Past Medical History:  Diagnosis Date   Anemia    as a child   Anxiety    takes Xanax daily   Arthritis    Cataracts, bilateral    immature   Chronic insomnia 10/11/2014   Complication of anesthesia    Constipation    will occasionally take Milk of Mag   Diverticulosis    Dizziness    rarely   GERD (gastroesophageal reflux disease)    takes Omeprazole every other day   Glaucoma    borderline and no drops required   Hearing loss    but doesn't have hearing aids   History of bronchitis    many many yrs ago   History of colitis    History of colon polyps    Hypertension    hasn't been on meds for the past 3yrs    Insomnia    takes Trazodone nightly as needed and Ambien nightly    Insomnia due to substance 10/11/2014   Joint pain    Joint swelling    PONV (postoperative nausea and vomiting)    Stroke (HCC)    Tinnitus    takes HCTZ daily to decrease pressure in ears   Patient Active Problem  List   Diagnosis Date Noted   Abnormal electrocardiogram (ECG) (EKG) 08/31/2022   Adjustment disorder with depressed mood 08/31/2022   Bunion of great toe 08/31/2022   Dysphagia 08/31/2022   Glaucoma 08/31/2022   Loss of appetite 08/31/2022   Mitral valve regurgitation 08/31/2022   Fatigue 08/31/2022   Palpitations 03/12/2022   Thrombocytopenia (HCC) 11/25/2021   Retinal hemorrhage, right eye 10/12/2021   Snores 10/12/2021   Dysuria 09/04/2021   Iron deficiency 09/04/2021   Leg swelling 07/09/2021   Right hip pain 05/13/2021   Acute bilateral low back pain with right-sided sciatica 05/13/2021   Posterior vitreous detachment of both eyes 02/25/2021   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   Anxiety and depression 08/14/2020   Morbid obesity (HCC) 05/01/2020   Diverticular disease of colon 05/01/2020   Chronic idiopathic constipation 05/01/2020   Decreased vision of left eye 02/28/2020   Elevated blood pressure reading in office without diagnosis of hypertension 12/15/2018   Blindness 09/14/2018   GERD (gastroesophageal reflux disease) 04/21/2018   TMJ pain dysfunction syndrome 03/23/2018   Pulsatile tinnitus of both ears 03/23/2018   Chronic post-traumatic stress disorder (PTSD) 03/14/2018   Routine general medical examination at a health care  facility 03/14/2018   Abnormal auditory perception of both ears 06/03/2016   Neck pain 03/12/2016   Abnormality of gait 06/03/2015   Chronic left SI joint pain 06/03/2015   Chronic insomnia 10/11/2014   DJD (degenerative joint disease) of knee 03/13/2014   Sensorineural hearing loss, bilateral 11/27/2013   Home Medication(s) Prior to Admission medications   Medication Sig Start Date End Date Taking? Authorizing Provider  ALPRAZolam Prudy Feeler) 1 MG tablet Take 1 tablet (1 mg total) by mouth 3 (three) times daily. Patient taking differently: Take 1 mg by mouth 2 (two) times daily. 01/24/23  Yes Myrlene Broker, MD   carboxymethylcellulose (REFRESH PLUS) 0.5 % SOLN Place 1 drop into both eyes 3 (three) times daily as needed (dry eyes).   Yes [provider]  esomeprazole (NEXIUM) 40 MG capsule TAKE 1 CAPSULE (40 MG TOTAL) BY MOUTH 2 (TWO) TIMES DAILY BEFORE A MEAL. 10/15/22  Yes Myrlene Broker, MD  Magnesium 250 MG TABS Take 1 tablet by mouth daily.   Yes [provider]  sucralfate (CARAFATE) 1 GM/10ML suspension Take 1 g by mouth 3 (three) times daily as needed (acid reflux). Prn use - prescribed by [facility] provider   Yes [provider]  zolpidem (AMBIEN) 10 MG tablet Take 1 tablet (10 mg total) by mouth at bedtime as needed for sleep. 03/29/23  Yes Myrlene Broker, MD  ondansetron (ZOFRAN ODT) 4 MG disintegrating tablet 4mg  ODT q4 hours prn nausea/vomit Patient not taking: Reported on 06/07/2023 08/13/21   Bethann Berkshire, MD                                                                                                                                    Allergies Iodinated contrast media, Other, Shellfish allergy, Gadolinium derivatives, Escitalopram, Hydrocodone, Magnesium hydroxide, Metronidazole, Acetazolamide, Gabapentin, and Mirtazapine  Review of Systems Review of Systems  Constitutional:  Positive for fatigue. Negative for fever.  Respiratory:  Negative for shortness of breath.   Cardiovascular:  Positive for chest pain.  Gastrointestinal:  Positive for nausea. Negative for vomiting.   As noted in HPI  Physical Exam Vital Signs  I have reviewed the triage vital signs BP (!) 158/78 (BP Location: Right Arm)   Pulse 63   Temp 97.6 F (36.4 C) (Oral)   Resp 18   SpO2 99%   Physical Exam Vitals reviewed.  Constitutional:      General: She is not in acute distress.    Appearance: She is well-developed. She is not diaphoretic.  HENT:     Head: Normocephalic and atraumatic.     Nose: Nose normal.  Eyes:     General: No scleral icterus.       Right eye:  No discharge.        Left eye: No discharge.     Conjunctiva/sclera: Conjunctivae normal.     Pupils: Pupils are equal, round, and reactive to  light.  Cardiovascular:     Rate and Rhythm: Normal rate and regular rhythm.     Heart sounds: No murmur heard.    No friction rub. No gallop.  Pulmonary:     Effort: Pulmonary effort is normal. No respiratory distress.     Breath sounds: Normal breath sounds. No stridor. No rales.  Abdominal:     General: There is no distension.     Palpations: Abdomen is soft.     Tenderness: There is no abdominal tenderness.  Musculoskeletal:        General: No tenderness.     Cervical back: Normal range of motion and neck supple.  Skin:    General: Skin is warm and dry.     Findings: No erythema or rash.  Neurological:     Mental Status: She is alert and oriented to person, place, and time.     ED Results and Treatments Labs (all labs ordered are listed, but only abnormal results are displayed) Labs Reviewed  BASIC METABOLIC PANEL - Abnormal; Notable for the following components:      Result Value   Glucose, Bld 102 (*)    Calcium 8.8 (*)    All other components within normal limits  CBC - Abnormal; Notable for the following components:   Platelets 144 (*)    All other components within normal limits  HEPATIC FUNCTION PANEL - Abnormal; Notable for the following components:   Albumin 3.4 (*)    All other components within normal limits  LIPASE, BLOOD  TROPONIN I (HIGH SENSITIVITY)  TROPONIN I (HIGH SENSITIVITY)                                                                                                                         EKG  EKG Interpretation  Date/Time:  Monday June 06 2023 23:55:59 EDT Ventricular Rate:  61 PR Interval:  172 QRS Duration: 88 QT Interval:  433 QTC Calculation: 437 R Axis:   22 Text Interpretation: Sinus rhythm No significant change was found Confirmed by Drema Pry [phone]) on 06/07/2023 12:15:26 AM        Radiology No results found.  Medications Ordered in ED Medications - No data to display Procedures Procedures  (including critical care time) Medical Decision Making / ED Course   Medical Decision Making Amount and/or Complexity of Data Reviewed Labs: ordered. Decision-making details documented in ED Course. Radiology: ordered and independent interpretation performed. Decision-making details documented in ED Course. ECG/medicine tests: ordered and independent interpretation performed. Decision-making details documented in ED Course.  Risk Decision regarding hospitalization.    Patient presents with chest pain  Differential includes GERD, ACS, biliary disease.  Not classic for dissection or esophageal perforation.  Doubt pneumonia or pneumothorax but this will be evaluated with chest x-ray.  I have low suspicion for pulmonary embolism.  Unlikely pancreatitis.  Heart score of 3 EKG without acute ischemic changes, dysrhythmias or blocks. Initial troponin negative Delta troponin negative  CBC without  leukocytosis or anemia BMP without significant electrolyte derangements or renal insufficiency. No evidence of bili obstruction or pancreatitis Chest x-ray without evidence of pneumonia, pneumothorax, pulmonary edema pleural effusion  Patient has remained chest pain-free for the last 3 hours. Given her reassuring workup and low heart score, patient does not require admission for continued workup.    Final Clinical Impression(s) / ED Diagnoses Final diagnoses:  Atypical chest pain   The patient appears reasonably screened and/or stabilized for discharge and I doubt any other medical condition or other [facility] requiring further screening, evaluation, or treatment in the ED at this time. I have discussed the findings, Dx and Tx plan with the patient/family who expressed understanding and agree(s) with the plan. Discharge instructions discussed at length. The patient/family was given  strict return precautions who verbalized understanding of the instructions. No further questions at time of discharge.  Disposition: Discharge  Condition: Good  ED Discharge Orders     None       Follow Up: Myrlene Broker, MD [address] [place] [address] [phone]  Call  to schedule an appointment for close follow up     This chart was dictated using voice recognition software.  Despite best efforts to proofread,  errors can occur which can change the documentation meaning.    Nira Conn, MD 06/07/23 [phone]

## 2023-06-07 NOTE — ED Notes (Signed)
Save dark green and blue tube in main lab 

## 2023-06-13 ENCOUNTER — Telehealth: Payer: Self-pay

## 2023-06-13 ENCOUNTER — Encounter: Payer: Self-pay | Admitting: Internal Medicine

## 2023-06-13 ENCOUNTER — Ambulatory Visit (INDEPENDENT_AMBULATORY_CARE_PROVIDER_SITE_OTHER): Payer: PPO | Admitting: Internal Medicine

## 2023-06-13 VITALS — BP 136/78 | HR 68 | Temp 98.4°F | Ht 64.0 in | Wt 226.0 lb

## 2023-06-13 DIAGNOSIS — D696 Thrombocytopenia, unspecified: Secondary | ICD-10-CM

## 2023-06-13 DIAGNOSIS — R030 Elevated blood-pressure reading, without diagnosis of hypertension: Secondary | ICD-10-CM | POA: Diagnosis not present

## 2023-06-13 DIAGNOSIS — K219 Gastro-esophageal reflux disease without esophagitis: Secondary | ICD-10-CM | POA: Diagnosis not present

## 2023-06-13 NOTE — Progress Notes (Signed)
   Subjective:   Patient ID: Gabrielle [name], female    DOB: [birth year], 72 y.o.   MRN: [id number]  HPI The patient is a 72 YO female coming in for ER follow up (in for atypical chest pain). She is feeling well today and was concerned as BP was mildly high in the ER.   Review of Systems  Constitutional: Negative.   HENT:  Positive for tinnitus.   Eyes: Negative.   Respiratory:  Negative for cough, chest tightness and shortness of breath.   Cardiovascular:  Negative for chest pain, palpitations and leg swelling.  Gastrointestinal:  Negative for abdominal distention, abdominal pain, constipation, diarrhea, nausea and vomiting.       GERD  Musculoskeletal: Negative.   Skin: Negative.   Neurological: Negative.   Psychiatric/Behavioral: Negative.      Objective:  Physical Exam Constitutional:      Appearance: She is well-developed.  HENT:     Head: Normocephalic and atraumatic.  Cardiovascular:     Rate and Rhythm: Normal rate and regular rhythm.  Pulmonary:     Effort: Pulmonary effort is normal. No respiratory distress.     Breath sounds: Normal breath sounds. No wheezing or rales.  Abdominal:     General: Bowel sounds are normal. There is no distension.     Palpations: Abdomen is soft.     Tenderness: There is no abdominal tenderness. There is no rebound.  Musculoskeletal:     Cervical back: Normal range of motion.  Skin:    General: Skin is warm and dry.  Neurological:     Mental Status: She is alert and oriented to person, place, and time.     Coordination: Coordination normal.     Vitals:   06/13/23 1548  BP: 136/78  Pulse: 68  Temp: 98.4 F (36.9 C)  TempSrc: Oral  SpO2: 99%  Weight: 226 lb (102.5 kg)  Height: 5\' 4"  (1.626 m)    Assessment & Plan:  Visit time 20 minutes in face to face communication with patient and coordination of care, additional 10 minutes spent in record review, coordination or care, ordering tests, communicating/referring to other  healthcare professionals, documenting in medical records all on the same day of the visit for total time 30 minutes spent on the visit.

## 2023-06-13 NOTE — Patient Instructions (Signed)
For your blood pressure the goal is <140/90 and yours today was 136/78 which is good.

## 2023-06-13 NOTE — Telephone Encounter (Signed)
Transition Care Management Follow-up Telephone Call Date of discharge and from where: [facility]  How have you been since you were released from the hospital? Doing ok Any questions or concerns? No  Items Reviewed: Did the pt receive and understand the discharge instructions provided? Yes  Medications obtained and verified? Yes  Other? No  Any new allergies since your discharge? No  Dietary orders reviewed? No Do you have support at home? Yes    Follow up appointments reviewed:  PCP Hospital f/u appt confirmed? Yes  Scheduled to see PCP on 6/17 @ 3:40. Specialist Hospital f/u appt confirmed? No  Scheduled to see  on  @ . Are transportation arrangements needed? No  If their condition worsens, is the pt aware to call PCP or go to the Emergency Dept.? Yes Was the patient provided with contact information for the PCP's office or ED? Yes Was to pt encouraged to call back with questions or concerns? Yes

## 2023-06-13 NOTE — Telephone Encounter (Signed)
Transition Care Management Unsuccessful Follow-up Telephone Call  Date of discharge and from where:  [facility] 6/11  Attempts:  1st Attempt  Reason for unsuccessful TCM follow-up call:  Left voice message   Daelan Gatt [facility] Care Guide, [place] [phone] [address], , [place] [address] Phone: [phone] Email: [email or web address]       

## 2023-06-14 ENCOUNTER — Encounter: Payer: Self-pay | Admitting: Internal Medicine

## 2023-06-14 NOTE — Assessment & Plan Note (Signed)
She did have recent atypical chest pain which could have been esophageal spasm. She was treated and has not had recurrence. She will stay on nexium 40 mg BID and sucralfate 1 g TID prn.

## 2023-06-14 NOTE — Assessment & Plan Note (Signed)
Recent CBC stable and not on medication for this currently.

## 2023-06-14 NOTE — Assessment & Plan Note (Signed)
BP normal at visit, mildly elevated while having chest pain in the ER. Continue close monitoring and if needed BP medication amlodipine would be first option.

## 2023-06-14 NOTE — Assessment & Plan Note (Signed)
BMI 38 and complicated by depression and GERD. She is working on diet and exercise is limited.

## 2023-06-22 DIAGNOSIS — H40052 Ocular hypertension, left eye: Secondary | ICD-10-CM | POA: Diagnosis not present

## 2023-06-22 DIAGNOSIS — H0288B Meibomian gland dysfunction left eye, upper and lower eyelids: Secondary | ICD-10-CM | POA: Diagnosis not present

## 2023-06-22 DIAGNOSIS — H0288A Meibomian gland dysfunction right eye, upper and lower eyelids: Secondary | ICD-10-CM | POA: Diagnosis not present

## 2023-06-22 DIAGNOSIS — H11823 Conjunctivochalasis, bilateral: Secondary | ICD-10-CM | POA: Diagnosis not present

## 2023-06-22 DIAGNOSIS — D3132 Benign neoplasm of left choroid: Secondary | ICD-10-CM | POA: Diagnosis not present

## 2023-06-22 DIAGNOSIS — H43813 Vitreous degeneration, bilateral: Secondary | ICD-10-CM | POA: Diagnosis not present

## 2023-06-22 DIAGNOSIS — H401111 Primary open-angle glaucoma, right eye, mild stage: Secondary | ICD-10-CM | POA: Diagnosis not present

## 2023-06-22 DIAGNOSIS — H47012 Ischemic optic neuropathy, left eye: Secondary | ICD-10-CM | POA: Diagnosis not present

## 2023-06-22 DIAGNOSIS — H02822 Cysts of right lower eyelid: Secondary | ICD-10-CM | POA: Diagnosis not present

## 2023-06-22 DIAGNOSIS — Z961 Presence of intraocular lens: Secondary | ICD-10-CM | POA: Diagnosis not present

## 2023-07-01 DIAGNOSIS — H9313 Tinnitus, bilateral: Secondary | ICD-10-CM | POA: Diagnosis not present

## 2023-07-01 DIAGNOSIS — H9113 Presbycusis, bilateral: Secondary | ICD-10-CM | POA: Diagnosis not present

## 2023-07-09 IMAGING — CT CT ABD-PELV W/O CM
2 of 4 series · 17 of 46 positions shown, 19 images · non-contrast
Comparison: CT abdomen pelvis dated July 28, 2021

CLINICAL DATA: Abdominal abscess suspected

EXAM:
CT ABDOMEN AND PELVIS WITHOUT CONTRAST
TECHNIQUE: Multidetector CT imaging of the abdomen and pelvis was performed
following the standard protocol without IV contrast.

[Series 2: axial st · axial · 0.79mm/px · z∈[-484,-98]mm · 14 of 89 slices shown, 16 images]
[im 6/89  soft-tissue]
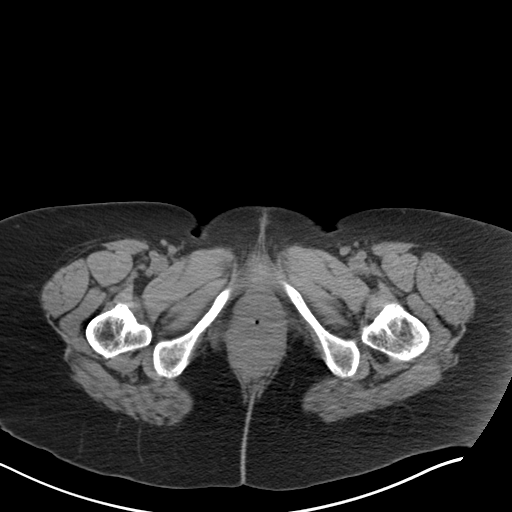
[im 6/89  bone]
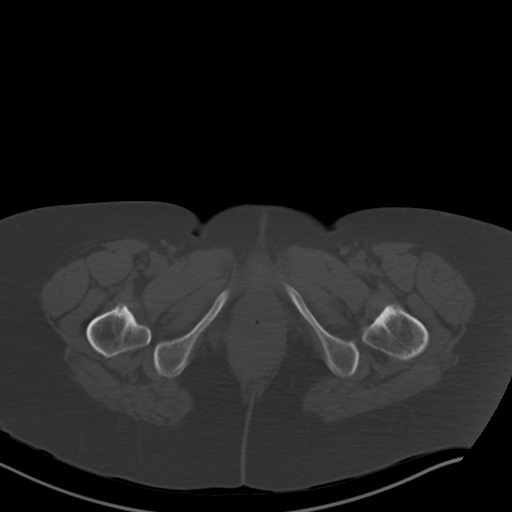
[im 11/89  soft-tissue]
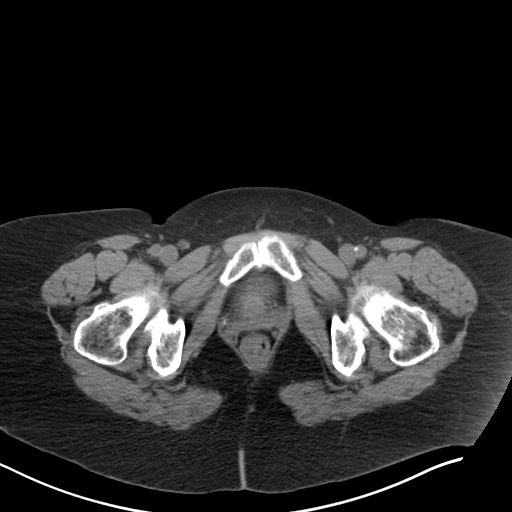
[im 16/89  soft-tissue]
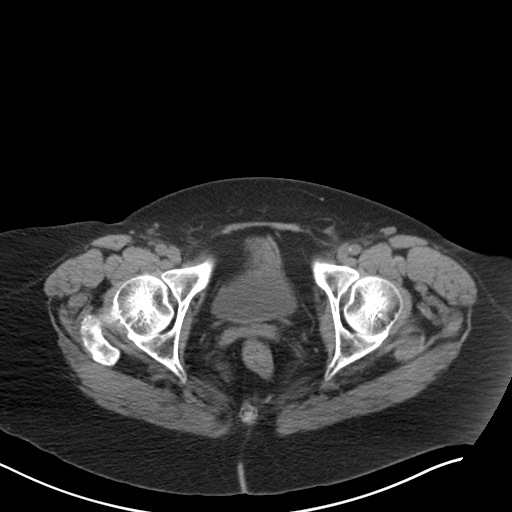
[im 26/89  soft-tissue]
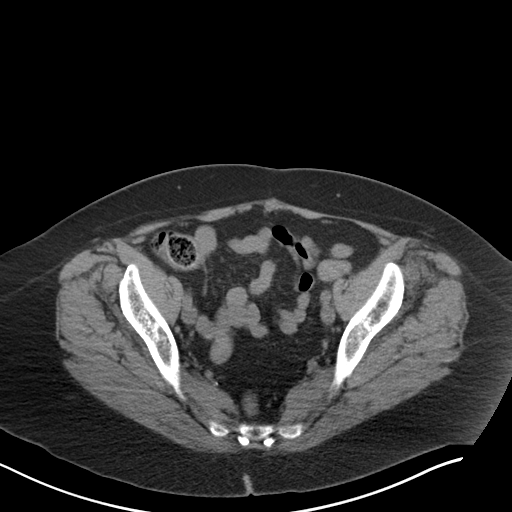
[im 32/89  soft-tissue]
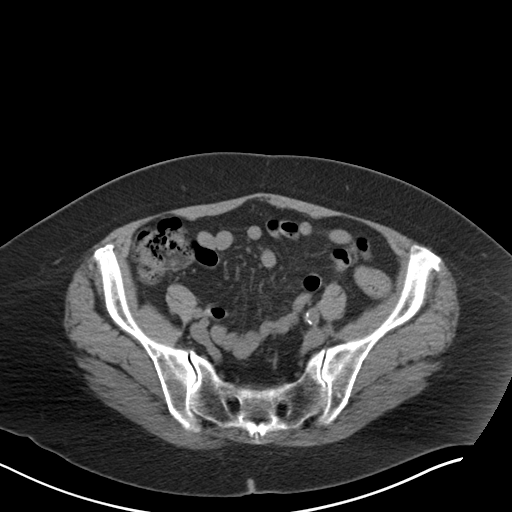
[im 37/89  soft-tissue]
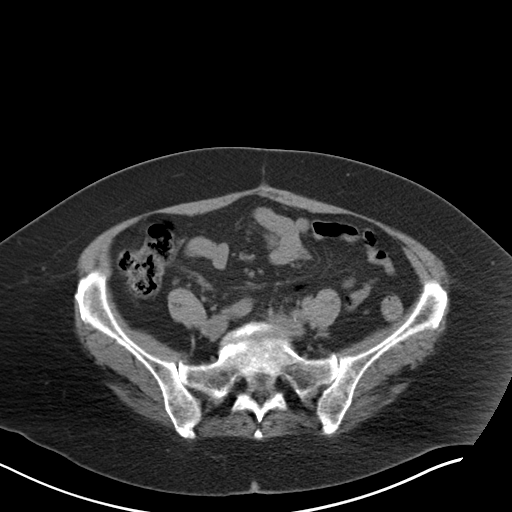
[im 42/89  soft-tissue]
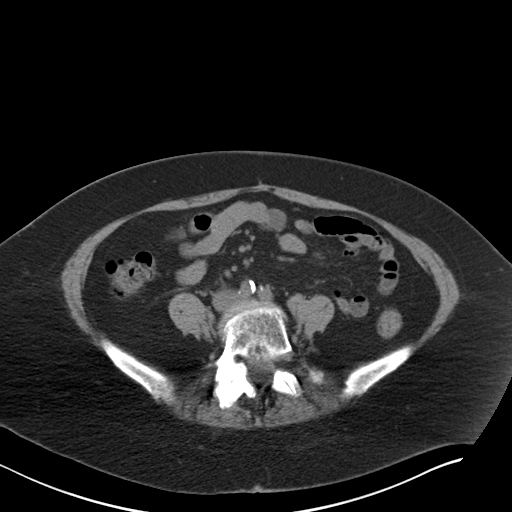
[im 47/89  soft-tissue]
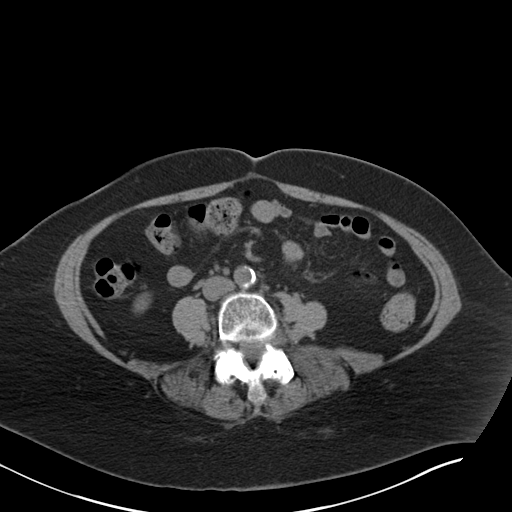
[im 52/89  soft-tissue]
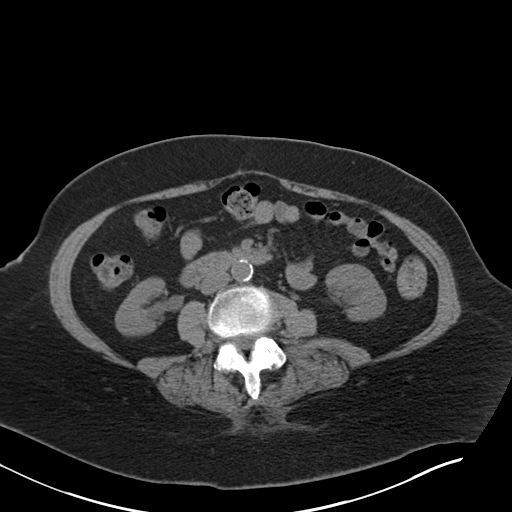
[im 52/89  bone]
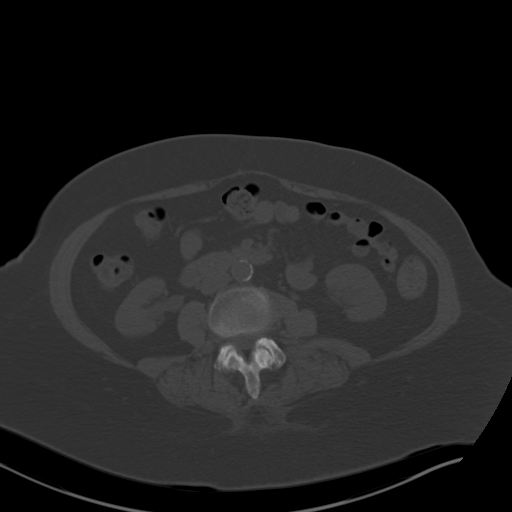
[im 57/89  soft-tissue]
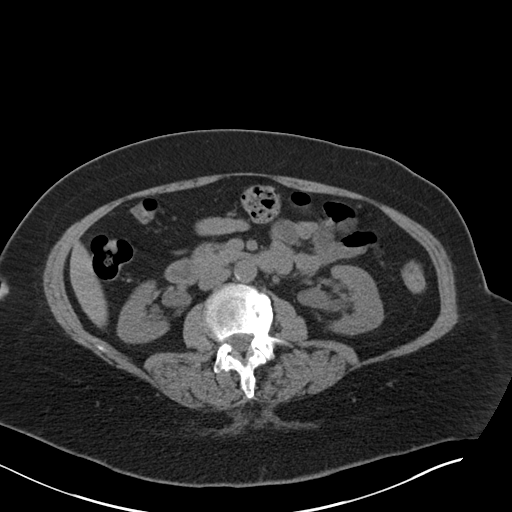
[im 68/89  soft-tissue]
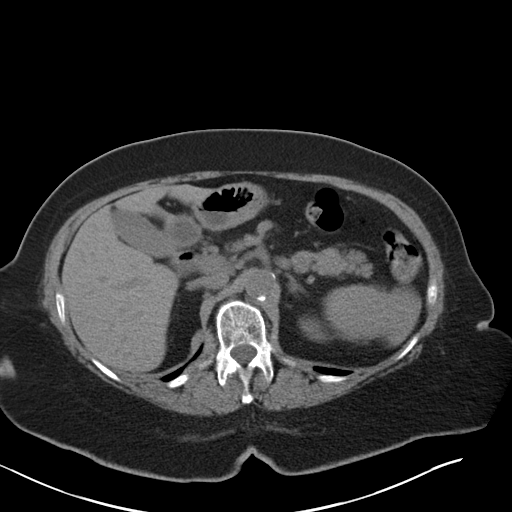
[im 73/89  soft-tissue]
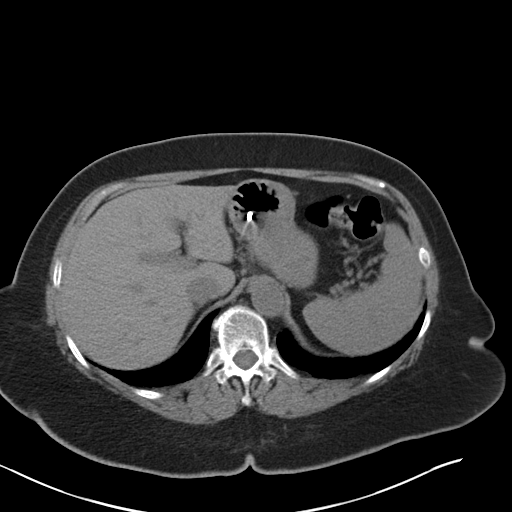
[im 78/89  soft-tissue]
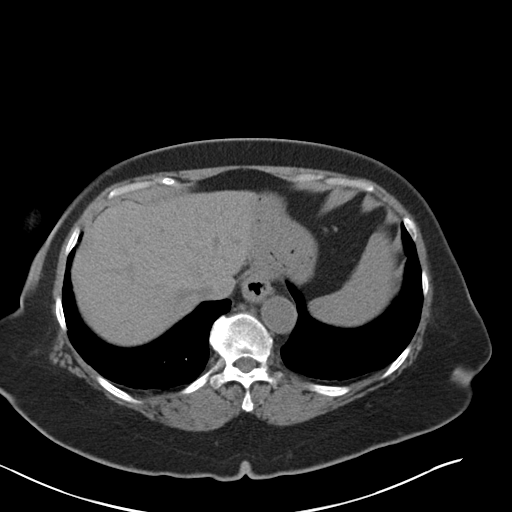
[im 83/89  soft-tissue]
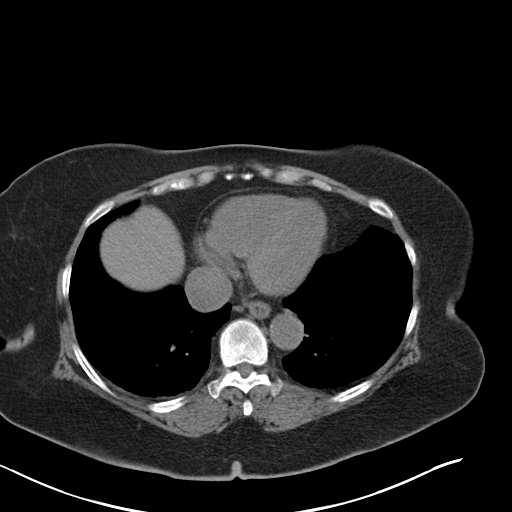

[Series 5: coronal st · coronal · 0.99mm/px · 3 of 120 slices shown]
[im 40/120  soft-tissue]
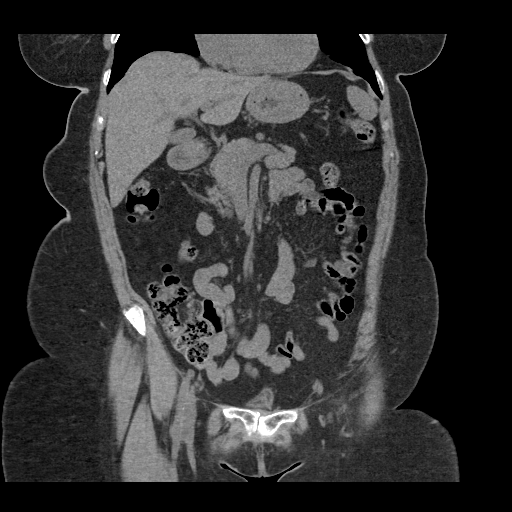
[im 53/120  soft-tissue]
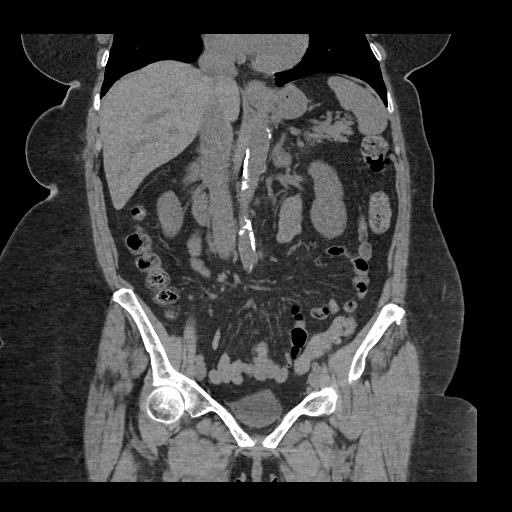
[im 67/120  soft-tissue]
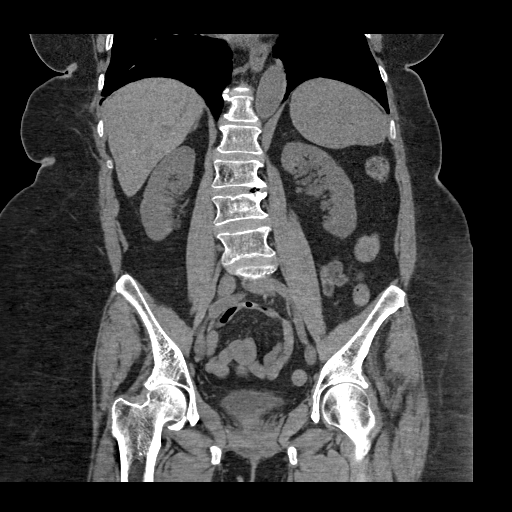

[17 of 46 positions shown; findings below may reference images not displayed]

FINDINGS: Lower chest: No acute abnormality.

Hepatobiliary: No focal liver abnormality is seen. No gallstones,
gallbladder wall thickening, or biliary dilatation.

Pancreas: Unremarkable

Spleen: Unremarkable

Adrenals/Urinary Tract: Unchanged left adrenal gland nodule
measuring 1.4 cm. Bilateral renal sinus cysts. No evidence of
hydronephrosis or nephrolithiasis. Bladder is unremarkable.

Stomach/Bowel: Small hiatal hernia. Surgical clip noted in the
stomach. No evidence of obstruction, wall thickening or inflammatory
change. Scattered diverticula of the sigmoid colon.

Vascular/Lymphatic: Aortic atherosclerosis. No enlarged abdominal or
pelvic lymph nodes.

Reproductive: Status post hysterectomy. No adnexal masses.

Other: Tiny fat containing umbilical hernia no abdominopelvic
ascites.

Musculoskeletal: No acute or significant osseous findings.
IMPRESSION: No acute findings in the abdomen or pelvis.

Aortic Atherosclerosis ([id number]).

## 2023-07-13 DIAGNOSIS — M1712 Unilateral primary osteoarthritis, left knee: Secondary | ICD-10-CM | POA: Diagnosis not present

## 2023-07-14 DIAGNOSIS — H02822 Cysts of right lower eyelid: Secondary | ICD-10-CM | POA: Diagnosis not present

## 2023-07-14 DIAGNOSIS — H04123 Dry eye syndrome of bilateral lacrimal glands: Secondary | ICD-10-CM | POA: Diagnosis not present

## 2023-07-14 DIAGNOSIS — H02109 Unspecified ectropion of unspecified eye, unspecified eyelid: Secondary | ICD-10-CM | POA: Diagnosis not present

## 2023-07-16 ENCOUNTER — Other Ambulatory Visit: Payer: Self-pay | Admitting: Internal Medicine

## 2023-08-03 DIAGNOSIS — M1712 Unilateral primary osteoarthritis, left knee: Secondary | ICD-10-CM | POA: Diagnosis not present

## 2023-08-08 ENCOUNTER — Telehealth: Payer: Self-pay | Admitting: Internal Medicine

## 2023-08-08 NOTE — Telephone Encounter (Signed)
Patient needs a refill on her carafate - she was getting it from the [place] - she needs to talk to the nurse about this.  [phone]

## 2023-08-09 ENCOUNTER — Other Ambulatory Visit: Payer: Self-pay

## 2023-08-09 MED ORDER — SUCRALFATE 1 GM/10ML PO SUSP: 1.0000 g | Freq: Three times a day (TID) | ORAL | 0 refills | Status: DC | PRN

## 2023-08-09 NOTE — Telephone Encounter (Signed)
Ok to refill 

## 2023-08-09 NOTE — Telephone Encounter (Signed)
Is this ok to refill.../lmb 

## 2023-08-09 NOTE — Telephone Encounter (Signed)
Refill done.  

## 2023-08-10 DIAGNOSIS — M1712 Unilateral primary osteoarthritis, left knee: Secondary | ICD-10-CM | POA: Diagnosis not present

## 2023-08-16 DIAGNOSIS — H0288B Meibomian gland dysfunction left eye, upper and lower eyelids: Secondary | ICD-10-CM | POA: Diagnosis not present

## 2023-08-16 DIAGNOSIS — H02822 Cysts of right lower eyelid: Secondary | ICD-10-CM | POA: Diagnosis not present

## 2023-08-16 DIAGNOSIS — H0288A Meibomian gland dysfunction right eye, upper and lower eyelids: Secondary | ICD-10-CM | POA: Diagnosis not present

## 2023-08-16 DIAGNOSIS — H401111 Primary open-angle glaucoma, right eye, mild stage: Secondary | ICD-10-CM | POA: Diagnosis not present

## 2023-08-16 DIAGNOSIS — H47012 Ischemic optic neuropathy, left eye: Secondary | ICD-10-CM | POA: Diagnosis not present

## 2023-08-16 DIAGNOSIS — H11823 Conjunctivochalasis, bilateral: Secondary | ICD-10-CM | POA: Diagnosis not present

## 2023-08-16 DIAGNOSIS — H43813 Vitreous degeneration, bilateral: Secondary | ICD-10-CM | POA: Diagnosis not present

## 2023-08-16 DIAGNOSIS — H40052 Ocular hypertension, left eye: Secondary | ICD-10-CM | POA: Diagnosis not present

## 2023-08-16 DIAGNOSIS — D3132 Benign neoplasm of left choroid: Secondary | ICD-10-CM | POA: Diagnosis not present

## 2023-08-16 DIAGNOSIS — Z961 Presence of intraocular lens: Secondary | ICD-10-CM | POA: Diagnosis not present

## 2023-08-16 DIAGNOSIS — G43B0 Ophthalmoplegic migraine, not intractable: Secondary | ICD-10-CM | POA: Diagnosis not present

## 2023-08-17 DIAGNOSIS — M1712 Unilateral primary osteoarthritis, left knee: Secondary | ICD-10-CM | POA: Diagnosis not present

## 2023-08-31 ENCOUNTER — Other Ambulatory Visit: Payer: Self-pay | Admitting: Internal Medicine

## 2023-08-31 DIAGNOSIS — F5104 Psychophysiologic insomnia: Secondary | ICD-10-CM

## 2023-08-31 DIAGNOSIS — F4312 Post-traumatic stress disorder, chronic: Secondary | ICD-10-CM

## 2023-09-01 NOTE — Telephone Encounter (Signed)
Please call patient when this has been sent - [phone] - patient also wants to know more information about the genetic testing

## 2023-09-02 ENCOUNTER — Telehealth: Payer: Self-pay | Admitting: Internal Medicine

## 2023-09-02 NOTE — Telephone Encounter (Signed)
Patient would like a call back when this is sent to the pharmacy.   Prescription Request  09/02/2023  LOV: 06/13/2023  What is the name of the medication or equipment?  ALPRAZolam Prudy Feeler) 1 MG tablet   Have you contacted your pharmacy to request a refill? Yes   Which pharmacy would you like this sent to?  [id number] - [place], [place] - [address] AT [facility] [address] [place] [address] Phone: [phone] Fax: [phone]    Patient notified that their request is being sent to the clinical staff for review and that they should receive a response within 2 business days.   Please advise at Mobile [phone] (mobile)

## 2023-09-02 NOTE — Telephone Encounter (Signed)
This has already been sent and addressed by the provider

## 2023-09-14 DIAGNOSIS — B351 Tinea unguium: Secondary | ICD-10-CM | POA: Diagnosis not present

## 2023-09-14 DIAGNOSIS — L03011 Cellulitis of right finger: Secondary | ICD-10-CM | POA: Diagnosis not present

## 2023-09-14 DIAGNOSIS — L989 Disorder of the skin and subcutaneous tissue, unspecified: Secondary | ICD-10-CM | POA: Diagnosis not present

## 2023-09-14 DIAGNOSIS — L603 Nail dystrophy: Secondary | ICD-10-CM | POA: Diagnosis not present

## 2023-09-14 DIAGNOSIS — L03012 Cellulitis of left finger: Secondary | ICD-10-CM | POA: Diagnosis not present

## 2023-09-20 DIAGNOSIS — M542 Cervicalgia: Secondary | ICD-10-CM | POA: Diagnosis not present

## 2023-09-20 DIAGNOSIS — H9113 Presbycusis, bilateral: Secondary | ICD-10-CM | POA: Diagnosis not present

## 2023-09-20 DIAGNOSIS — K5900 Constipation, unspecified: Secondary | ICD-10-CM | POA: Diagnosis not present

## 2023-09-20 DIAGNOSIS — H9313 Tinnitus, bilateral: Secondary | ICD-10-CM | POA: Diagnosis not present

## 2023-09-20 DIAGNOSIS — K219 Gastro-esophageal reflux disease without esophagitis: Secondary | ICD-10-CM | POA: Diagnosis not present

## 2023-09-20 DIAGNOSIS — Z86010 Personal history of colonic polyps: Secondary | ICD-10-CM | POA: Diagnosis not present

## 2023-09-20 DIAGNOSIS — Z1211 Encounter for screening for malignant neoplasm of colon: Secondary | ICD-10-CM | POA: Diagnosis not present

## 2023-09-21 ENCOUNTER — Encounter (HOSPITAL_BASED_OUTPATIENT_CLINIC_OR_DEPARTMENT_OTHER): Payer: Self-pay | Admitting: Emergency Medicine

## 2023-09-21 ENCOUNTER — Emergency Department (HOSPITAL_BASED_OUTPATIENT_CLINIC_OR_DEPARTMENT_OTHER): Payer: No Typology Code available for payment source

## 2023-09-21 ENCOUNTER — Emergency Department (HOSPITAL_BASED_OUTPATIENT_CLINIC_OR_DEPARTMENT_OTHER)
Admission: EM | Admit: 2023-09-21 | Discharge: 2023-09-21 | Disposition: A | Discharge status: Home / Self Care | Payer: No Typology Code available for payment source | Attending: Emergency Medicine | Admitting: Emergency Medicine

## 2023-09-21 ENCOUNTER — Other Ambulatory Visit: Payer: Self-pay

## 2023-09-21 DIAGNOSIS — I10 Essential (primary) hypertension: Secondary | ICD-10-CM | POA: Diagnosis not present

## 2023-09-21 DIAGNOSIS — M47812 Spondylosis without myelopathy or radiculopathy, cervical region: Secondary | ICD-10-CM | POA: Diagnosis not present

## 2023-09-21 DIAGNOSIS — M4302 Spondylolysis, cervical region: Secondary | ICD-10-CM | POA: Insufficient documentation

## 2023-09-21 DIAGNOSIS — F17200 Nicotine dependence, unspecified, uncomplicated: Secondary | ICD-10-CM | POA: Diagnosis not present

## 2023-09-21 DIAGNOSIS — Z79899 Other long term (current) drug therapy: Secondary | ICD-10-CM | POA: Diagnosis not present

## 2023-09-21 DIAGNOSIS — M62838 Other muscle spasm: Secondary | ICD-10-CM | POA: Insufficient documentation

## 2023-09-21 DIAGNOSIS — M50223 Other cervical disc displacement at C6-C7 level: Secondary | ICD-10-CM | POA: Diagnosis not present

## 2023-09-21 DIAGNOSIS — R918 Other nonspecific abnormal finding of lung field: Secondary | ICD-10-CM | POA: Insufficient documentation

## 2023-09-21 DIAGNOSIS — M542 Cervicalgia: Secondary | ICD-10-CM | POA: Diagnosis present

## 2023-09-21 DIAGNOSIS — M50222 Other cervical disc displacement at C5-C6 level: Secondary | ICD-10-CM | POA: Diagnosis not present

## 2023-09-21 DIAGNOSIS — M4802 Spinal stenosis, cervical region: Secondary | ICD-10-CM | POA: Diagnosis not present

## 2023-09-21 NOTE — ED Notes (Signed)
Patient transported to CT 

## 2023-09-21 NOTE — ED Triage Notes (Signed)
Pt caox4, ambulatory, NAD c/o L lateral neck pain x2 weeks which worsens on movement and certain position changes. Yesterday saw ENT for ear pain and was told it may be related to the neck pain. Pt denies any recent falls or trauma.

## 2023-09-21 NOTE — ED Notes (Signed)
Pt ambulated to bathroom

## 2023-09-21 NOTE — ED Provider Notes (Signed)
[place] EMERGENCY DEPARTMENT AT [facility] Provider Note   CSN: [id number] Arrival date & time: 09/21/23  4098     History  Chief Complaint  Patient presents with   Torticollis    Gabrielle [name] is a 72 y.o. female.  The history is provided by the patient.  Patient presents for left-sided neck pain she has had for up to 2 weeks.  Denies any falls or trauma.  No obvious injuries.  No arm or leg weakness.  It does not radiate into her head or her arms.  No chest pain or shortness of breath.  No headache is reported.  No previous neck surgery. She was seen yesterday by otolaryngology for ear pain and had some cerumen removed from her left ear.  She was instructed that her ear pain is likely from her neck.     Home Medications Prior to Admission medications   Medication Sig Start Date End Date Taking? Authorizing Provider  amLODipine (NORVASC) 5 MG tablet Take 5 mg by mouth daily. 06/19/23  Yes [provider]  ALPRAZolam (XANAX) 1 MG tablet TAKE 1 TABLET BY MOUTH 3 TIMES DAILY. 09/02/23   Myrlene Broker, MD  carboxymethylcellulose (REFRESH PLUS) 0.5 % SOLN Place 1 drop into both eyes 3 (three) times daily as needed (dry eyes).    [provider]  esomeprazole (NEXIUM) 40 MG capsule TAKE 1 CAPSULE (40 MG TOTAL) BY MOUTH 2 (TWO) TIMES DAILY BEFORE A MEAL. 07/18/23   Myrlene Broker, MD  Magnesium 250 MG TABS Take 1 tablet by mouth daily.    [provider]  ondansetron (ZOFRAN ODT) 4 MG disintegrating tablet 4mg  ODT q4 hours prn nausea/vomit 08/13/21   Bethann Berkshire, MD  sucralfate (CARAFATE) 1 GM/10ML suspension TAKE 3 TIMES DAILY AS NEEDED (ACID REFLUS) 09/02/23   Myrlene Broker, MD  zolpidem (AMBIEN) 10 MG tablet TAKE 1 TABLET BY MOUTH AT BEDTIME AS NEEDED FOR SLEEP. 09/02/23   Myrlene Broker, MD      Allergies    Iodinated contrast media, Other, Shellfish allergy, Gadolinium derivatives, Escitalopram, Hydrocodone,  Magnesium hydroxide, Metronidazole, Acetazolamide, Gabapentin, and Mirtazapine    Review of Systems   Review of Systems  Constitutional:  Negative for fever.  Cardiovascular:  Negative for chest pain.  Musculoskeletal:  Positive for neck pain.  Neurological:  Negative for weakness, numbness and headaches.    Physical Exam Updated Vital Signs BP (!) 174/79 (BP Location: Right Wrist)   Pulse 66   Temp 98 F (36.7 C)   Resp 20   Ht 1.626 m (5\' 4" )   Wt 55.3 kg   SpO2 100%   BMI 20.94 kg/m  Physical Exam CONSTITUTIONAL: Well developed/well nourished HEAD: Normocephalic/atraumatic EYES: EOMI/PERRL ENMT: Mucous membranes moist NECK: supple no meningeal signs, no bruits SPINE/BACK:entire spine nontender Left cervical paraspinal tenderness and trapezius tenderness CV: S1/S2 noted, no murmurs/rubs/gallops noted LUNGS: Lungs are clear to auscultation bilaterally, no apparent distress NEURO: Pt is awake/alert/appropriate, moves all extremitiesx4.  No facial droop.   Equal power (5/5) with hand grip, wrist flex/extension, elbow flex/extension, and equal power with shoulder abduction/adduction.  No focal sensory deficit to light touch is noted in either UE.   Equal (2+) biceps/brachioradialis reflex in bilateral UE EXTREMITIES: pulses normal/equal, full ROM SKIN: warm, color normal PSYCH: no abnormalities of mood noted, alert and oriented to situation  ED Results / Procedures / Treatments   Labs (all labs ordered are listed, but only abnormal results are  displayed) Labs Reviewed - No data to display  EKG None  Radiology CT Cervical Spine Wo Contrast  Result Date: 09/21/2023 CLINICAL DATA:  Neck pain, acute, no red flags. Left lateral neck pain x2 weeks. EXAM: CT CERVICAL SPINE WITHOUT CONTRAST TECHNIQUE: Multidetector CT imaging of the cervical spine was performed without intravenous contrast. Multiplanar CT image reconstructions were also generated. RADIATION DOSE REDUCTION:  This exam was performed according to the departmental dose-optimization program which includes automated exposure control, adjustment of the mA and/or kV according to patient size and/or use of iterative reconstruction technique. COMPARISON:  None Available. FINDINGS: Alignment: Normal. Skull base and vertebrae: No acute fracture. Normal craniocervical junction. No suspicious bone lesions. Degenerative endplate changes at C3-4 and C4-5. Soft tissues and spinal canal: No prevertebral fluid or swelling. No visible canal hematoma. Disc levels: C2-C3:  Normal. C3-C4: Central disc-osteophyte complex results in mild spinal canal stenosis. Facet arthropathy and uncovertebral joint spurring results in severe bilateral neural foraminal narrowing. C4-C5: Central disc-osteophyte complex results in mild spinal canal stenosis. Facet arthropathy and uncovertebral joint spurring results in severe bilateral neural foraminal narrowing. C5-C6: Small disc bulge and mild bilateral facet arthropathy. No spinal canal stenosis or neural foraminal narrowing. C6-C7: Small disc bulge without spinal canal stenosis. Facet arthropathy and uncovertebral joint spurring results in moderate bilateral neural foraminal narrowing. C7-T1:  Normal. Upper chest: Few ground-glass nodular opacities in the left lung apex, measuring up to 5 mm (axial image 86 series 3). Other: Atherosclerotic calcifications of the carotid bulbs. IMPRESSION: 1. No acute fracture or traumatic listhesis of the cervical spine. 2. Multilevel cervical spondylosis, worst at C3-4 and C4-5, where there is mild spinal canal stenosis and severe bilateral neural foraminal narrowing. 3. Few ground-glass nodular opacities in the left lung apex, measuring up to 5 mm. Non-contrast chest CT at 3-6 months is recommended. If nodules persist and are stable at that time, consider additional non-contrast chest CT examinations at 2 and 4 years. This recommendation follows the consensus statement:  Guidelines for Management of Incidental Pulmonary Nodules Detected on CT Images: From the Fleischner Society 2017; Radiology 2017; 284:228-243. Electronically Signed   By: Orvan Falconer M.D.   On: 09/21/2023 11:32    Procedures Procedures    Medications Ordered in ED Medications - No data to display  ED Course/ Medical Decision Making/ A&P Clinical Course as of 09/21/23 1152  Wed Sep 21, 2023  1029 Patient presents for left neck pain for up to 2 weeks, no recent trauma.  She has no focal neurodeficits.  Due to her age and length of time of pain, will obtain screening CT C-spine  Due to extensive drug allergies as well as taking Ambien and Xanax, we are quite limited on pain control.  She will likely need to stick to OTC meds [DW]  1151 CT scan does not reveal any acute findings. She does have spondylosis. No emergent findings that require admission or surgical decompression [DW]  1152 Patient has evidence of lung nodules.  She is a distant smoker. Patient informed that she needs to have a CT chest in next 3 to 6 months.  She was informed to follow-up with her primary doctor for this.  This was placed in her discharge instructions [DW]    Clinical Course User Index [DW] [name], MD  Medical Decision Making Amount and/or Complexity of Data Reviewed Radiology: ordered.   This patient presents to the ED for concern of neck pain, this involves an extensive number of treatment options, and is a complaint that carries with it a high risk of complications and morbidity.  The differential diagnosis includes but is not limited to muscle strain, torticollis, cervical spine fracture, cervical spine tumor, carotid dissection  Comorbidities that complicate the patient evaluation: Patient's presentation is complicated by their history of hypertension  Social Determinants of Health: Patient's  extensive medication allergies   increases the complexity of  managing their presentation  Additional history obtained: Records reviewed Care Everywhere/External Records  Imaging Studies ordered: I ordered imaging studies including CT scan C-spine   I independently visualized and interpreted imaging which showed cervical spondylosis I agree with the radiologist interpretation   Reevaluation: After the interventions noted above, I reevaluated the patient and found that they have :improved  Complexity of problems addressed: Patient's presentation is most consistent with  acute presentation with potential threat to life or bodily function  Disposition: After consideration of the diagnostic results and the patient's response to treatment,  I feel that the patent would benefit from discharge   .           Final Clinical Impression(s) / ED Diagnoses Final diagnoses:  Muscle spasm  Cervical spondylolysis  Lung nodules    Rx / DC Orders ED Discharge Orders     None         [name], MD 09/21/23 1153

## 2023-09-21 NOTE — Discharge Instructions (Signed)
You have neck pain, possibly from a cervical strain and/or pinched nerve.   SEEK IMMEDIATE MEDICAL ATTENTION IF: You develop difficulties swallowing or breathing.  You have new or worse numbness, weakness, tingling, or movement problems in your arms or legs.  You develop increasing pain which is uncontrolled with medications.  You have change in bowel or bladder function, or other concerns.  As we discussed, you will need to have a CT scan of your chest in the next 3 to 6 months for lung nodules.  Please tell your primary doctor about this

## 2023-09-21 NOTE — ED Notes (Signed)
Pt given discharge instructions. Opportunities given for questions. Pt verbalizes understanding. A/ox4, VSS.  Jillyn Hidden, RN

## 2023-10-04 ENCOUNTER — Ambulatory Visit: Payer: PPO | Admitting: Internal Medicine

## 2023-10-06 ENCOUNTER — Ambulatory Visit: Payer: PPO | Admitting: Internal Medicine

## 2023-10-06 ENCOUNTER — Encounter: Payer: Self-pay | Admitting: Internal Medicine

## 2023-10-06 VITALS — BP 124/80 | HR 63 | Temp 98.2°F | Ht 64.0 in | Wt 221.0 lb

## 2023-10-06 DIAGNOSIS — Z23 Encounter for immunization: Secondary | ICD-10-CM

## 2023-10-06 DIAGNOSIS — M542 Cervicalgia: Secondary | ICD-10-CM

## 2023-10-06 DIAGNOSIS — R918 Other nonspecific abnormal finding of lung field: Secondary | ICD-10-CM | POA: Diagnosis not present

## 2023-10-06 NOTE — Patient Instructions (Signed)
We have given you the flu shot and will check labs next time.

## 2023-10-06 NOTE — Progress Notes (Signed)
   Subjective:   Patient ID: Gabrielle [name], female    DOB: 1951/03/06, 72 y.o.   MRN: [id number]  HPI The patient is a 72 YO female coming in for follow up ER visit for neck pain. Overall is improving and using heat and heating pad to help. About 50% improvement. Denies new concerns. Lung nodule detected on CT neck needs follow up.   Review of Systems  Constitutional: Negative.   HENT: Negative.    Eyes: Negative.   Respiratory:  Negative for cough, chest tightness and shortness of breath.   Cardiovascular:  Negative for chest pain, palpitations and leg swelling.  Gastrointestinal:  Negative for abdominal distention, abdominal pain, constipation, diarrhea, nausea and vomiting.  Musculoskeletal:  Positive for neck pain.  Skin: Negative.   Neurological: Negative.   Psychiatric/Behavioral: Negative.      Objective:  Physical Exam Constitutional:      Appearance: She is well-developed.  HENT:     Head: Normocephalic and atraumatic.  Cardiovascular:     Rate and Rhythm: Normal rate and regular rhythm.  Pulmonary:     Effort: Pulmonary effort is normal. No respiratory distress.     Breath sounds: Normal breath sounds. No wheezing or rales.  Abdominal:     General: Bowel sounds are normal. There is no distension.     Palpations: Abdomen is soft.     Tenderness: There is no abdominal tenderness. There is no rebound.  Musculoskeletal:        General: Tenderness present.     Cervical back: Normal range of motion.  Skin:    General: Skin is warm and dry.  Neurological:     Mental Status: She is alert and oriented to person, place, and time.     Coordination: Coordination normal.     Vitals:   10/06/23 0901  BP: 124/80  Pulse: 63  Temp: 98.2 F (36.8 C)  TempSrc: Oral  SpO2: 96%  Weight: 221 lb (100.2 kg)  Height: 5\' 4"  (1.626 m)    Assessment & Plan:  Flu shot given at visit

## 2023-10-06 NOTE — Assessment & Plan Note (Signed)
Ordered CT scan chest for 3-6 months from now for few 5 mm nodules noted on CT neck. She is past smoker around 10 pack year.

## 2023-10-06 NOTE — Assessment & Plan Note (Signed)
Muscular in origin. We discussed CT scan results that she does have severe arthritis in several places but this would typically cause symptoms of pain or numbness down the arms. She does not have these symptoms. Overall improving. No further imaging needed continue supportive care.

## 2023-10-25 ENCOUNTER — Telehealth: Payer: Self-pay

## 2023-10-25 DIAGNOSIS — R8281 Pyuria: Secondary | ICD-10-CM | POA: Diagnosis not present

## 2023-10-25 DIAGNOSIS — I10 Essential (primary) hypertension: Secondary | ICD-10-CM | POA: Diagnosis not present

## 2023-10-25 DIAGNOSIS — R918 Other nonspecific abnormal finding of lung field: Secondary | ICD-10-CM | POA: Diagnosis not present

## 2023-10-25 DIAGNOSIS — H9319 Tinnitus, unspecified ear: Secondary | ICD-10-CM | POA: Diagnosis not present

## 2023-10-25 DIAGNOSIS — E669 Obesity, unspecified: Secondary | ICD-10-CM | POA: Diagnosis not present

## 2023-10-25 NOTE — Telephone Encounter (Signed)
Transition Care Management Unsuccessful Follow-up Telephone Call  Date of discharge and from where:  09/21/2023 [facility].  Attempts:  1st Attempt  Reason for unsuccessful TCM follow-up call:  Left voice message  Kalim Kissel [facility]  [facility] Guide Direct Dial: [phone]  Website: [email or web address]

## 2023-10-26 ENCOUNTER — Telehealth: Payer: Self-pay

## 2023-10-26 LAB — LAB REPORT - SCANNED: EGFR: 61

## 2023-10-26 NOTE — Telephone Encounter (Signed)
Transition Care Management Follow-up Telephone Call Date of discharge and from where: 09/21/2023 [facility] How have you been since you were released from the hospital? Patient stated she is feeling ok. She is anxious about the lung nodules found on her xray.  Any questions or concerns? No  Items Reviewed: Did the pt receive and understand the discharge instructions provided? Yes  Medications obtained and verified?  No medication prescribed. Other? No  Any new allergies since your discharge? No  Dietary orders reviewed? Yes Do you have support at home? Yes   Follow up appointments reviewed:  PCP Hospital f/u appt confirmed? Yes  Scheduled to see Lanora Manis A. Okey Dupre, MD on 10/06/2023 @ [facility] [place]. Specialist Hospital f/u appt confirmed? No  Scheduled to see  on  @ . Are transportation arrangements needed? No  If their condition worsens, is the pt aware to call PCP or go to the Emergency Dept.? Yes Was the patient provided with contact information for the PCP's office or ED? Yes Was to pt encouraged to call back with questions or concerns? Yes   Ivy Puryear [facility]  [facility] Guide Direct Dial: [phone]  Website: [email or web address]

## 2023-11-01 DIAGNOSIS — H40052 Ocular hypertension, left eye: Secondary | ICD-10-CM | POA: Diagnosis not present

## 2023-11-01 DIAGNOSIS — H0288B Meibomian gland dysfunction left eye, upper and lower eyelids: Secondary | ICD-10-CM | POA: Diagnosis not present

## 2023-11-01 DIAGNOSIS — H43813 Vitreous degeneration, bilateral: Secondary | ICD-10-CM | POA: Diagnosis not present

## 2023-11-01 DIAGNOSIS — H02822 Cysts of right lower eyelid: Secondary | ICD-10-CM | POA: Diagnosis not present

## 2023-11-01 DIAGNOSIS — H11823 Conjunctivochalasis, bilateral: Secondary | ICD-10-CM | POA: Diagnosis not present

## 2023-11-01 DIAGNOSIS — H0288A Meibomian gland dysfunction right eye, upper and lower eyelids: Secondary | ICD-10-CM | POA: Diagnosis not present

## 2023-11-01 DIAGNOSIS — G43B0 Ophthalmoplegic migraine, not intractable: Secondary | ICD-10-CM | POA: Diagnosis not present

## 2023-11-01 DIAGNOSIS — D3132 Benign neoplasm of left choroid: Secondary | ICD-10-CM | POA: Diagnosis not present

## 2023-11-01 DIAGNOSIS — H47012 Ischemic optic neuropathy, left eye: Secondary | ICD-10-CM | POA: Diagnosis not present

## 2023-11-01 DIAGNOSIS — Z961 Presence of intraocular lens: Secondary | ICD-10-CM | POA: Diagnosis not present

## 2023-11-01 DIAGNOSIS — H401111 Primary open-angle glaucoma, right eye, mild stage: Secondary | ICD-10-CM | POA: Diagnosis not present

## 2023-11-02 ENCOUNTER — Other Ambulatory Visit: Payer: Self-pay | Admitting: Internal Medicine

## 2023-11-09 DIAGNOSIS — D122 Benign neoplasm of ascending colon: Secondary | ICD-10-CM | POA: Diagnosis not present

## 2023-11-09 DIAGNOSIS — K562 Volvulus: Secondary | ICD-10-CM | POA: Diagnosis not present

## 2023-11-09 DIAGNOSIS — K3189 Other diseases of stomach and duodenum: Secondary | ICD-10-CM | POA: Diagnosis not present

## 2023-11-09 DIAGNOSIS — R12 Heartburn: Secondary | ICD-10-CM | POA: Diagnosis not present

## 2023-11-09 DIAGNOSIS — D123 Benign neoplasm of transverse colon: Secondary | ICD-10-CM | POA: Diagnosis not present

## 2023-11-09 DIAGNOSIS — K5730 Diverticulosis of large intestine without perforation or abscess without bleeding: Secondary | ICD-10-CM | POA: Diagnosis not present

## 2023-11-09 DIAGNOSIS — K2950 Unspecified chronic gastritis without bleeding: Secondary | ICD-10-CM | POA: Diagnosis not present

## 2023-11-09 DIAGNOSIS — Z860101 Personal history of adenomatous and serrated colon polyps: Secondary | ICD-10-CM | POA: Diagnosis not present

## 2023-11-09 DIAGNOSIS — K449 Diaphragmatic hernia without obstruction or gangrene: Secondary | ICD-10-CM | POA: Diagnosis not present

## 2023-11-09 DIAGNOSIS — K635 Polyp of colon: Secondary | ICD-10-CM | POA: Diagnosis not present

## 2023-11-09 DIAGNOSIS — Z860100 Personal history of colon polyps, unspecified: Secondary | ICD-10-CM | POA: Diagnosis not present

## 2023-11-09 DIAGNOSIS — K2100 Gastro-esophageal reflux disease with esophagitis, without bleeding: Secondary | ICD-10-CM | POA: Diagnosis not present

## 2023-11-09 DIAGNOSIS — K319 Disease of stomach and duodenum, unspecified: Secondary | ICD-10-CM | POA: Diagnosis not present

## 2023-11-09 LAB — HM COLONOSCOPY

## 2023-11-14 ENCOUNTER — Telehealth: Payer: Self-pay

## 2023-11-14 NOTE — Telephone Encounter (Signed)
It does look like we may have procedure note in media. I still do not see a question to answer can you clarify?

## 2023-11-14 NOTE — Telephone Encounter (Signed)
I looked through her media and I see progress notes I'm not for sure if I seen results regarding the coloscopy

## 2023-11-14 NOTE — Telephone Encounter (Signed)
Please advise for patient

## 2023-11-14 NOTE — Telephone Encounter (Signed)
I don't see a question. Do we have the colonoscopy and endoscopy results available?

## 2023-11-14 NOTE — Telephone Encounter (Signed)
Called pt to schedule AWV, patient is requesting a call back in regards to Colonoscopy and Endoscopy results. I advised patient I would send a message to Dr. Frutoso Chase clinical staff requesting someone call her. Please call patient.

## 2023-11-15 ENCOUNTER — Ambulatory Visit (INDEPENDENT_AMBULATORY_CARE_PROVIDER_SITE_OTHER): Payer: PPO | Admitting: *Deleted

## 2023-11-15 DIAGNOSIS — Z0000 Encounter for general adult medical examination without abnormal findings: Secondary | ICD-10-CM

## 2023-11-15 NOTE — Patient Instructions (Signed)
Gabrielle [name] , Thank you for taking time to come for your Medicare Wellness Visit. I appreciate your ongoing commitment to your health goals. Please review the following plan we discussed and let me know if I can assist you in the future.   Screening recommendations/referrals: Colonoscopy: up to date Mammogram: up to date Bone Density: up to date Recommended yearly ophthalmology/optometry visit for glaucoma screening and checkup Recommended yearly dental visit for hygiene and checkup  Vaccinations: Influenza vaccine: up to date Pneumococcal vaccine:  Tdap vaccine: up to date Shingles vaccine:       Preventive Care 65 Years and Older, Female Preventive care refers to lifestyle choices and visits with your health care provider that can promote health and wellness. What does preventive care include? A yearly physical exam. This is also called an annual well check. Dental exams once or twice a year. Routine eye exams. Ask your health care provider how often you should have your eyes checked. Personal lifestyle choices, including: Daily care of your teeth and gums. Regular physical activity. Eating a healthy diet. Avoiding tobacco and drug use. Limiting alcohol use. Practicing safe sex. Taking low-dose aspirin every day. Taking vitamin and mineral supplements as recommended by your health care provider. What happens during an annual well check? The services and screenings done by your health care provider during your annual well check will depend on your age, overall health, lifestyle risk factors, and family history of disease. Counseling  Your health care provider may ask you questions about your: Alcohol use. Tobacco use. Drug use. Emotional well-being. Home and relationship well-being. Sexual activity. Eating habits. History of falls. Memory and ability to understand (cognition). Work and work Astronomer. Reproductive health. Screening  You may have the following tests  or measurements: Height, weight, and BMI. Blood pressure. Lipid and cholesterol levels. These may be checked every 5 years, or more frequently if you are over 58 years old. Skin check. Lung cancer screening. You may have this screening every year starting at age 31 if you have a 30-pack-year history of smoking and currently smoke or have quit within the past 15 years. Fecal occult blood test (FOBT) of the stool. You may have this test every year starting at age 98. Flexible sigmoidoscopy or colonoscopy. You may have a sigmoidoscopy every 5 years or a colonoscopy every 10 years starting at age 52. Hepatitis C blood test. Hepatitis B blood test. Sexually transmitted disease (STD) testing. Diabetes screening. This is done by checking your blood sugar (glucose) after you have not eaten for a while (fasting). You may have this done every 1-3 years. Bone density scan. This is done to screen for osteoporosis. You may have this done starting at age 43. Mammogram. This may be done every 1-2 years. Talk to your health care provider about how often you should have regular mammograms. Talk with your health care provider about your test results, treatment options, and if necessary, the need for more tests. Vaccines  Your health care provider may recommend certain vaccines, such as: Influenza vaccine. This is recommended every year. Tetanus, diphtheria, and acellular pertussis (Tdap, Td) vaccine. You may need a Td booster every 10 years. Zoster vaccine. You may need this after age 59. Pneumococcal 13-valent conjugate (PCV13) vaccine. One dose is recommended after age 48. Pneumococcal polysaccharide (PPSV23) vaccine. One dose is recommended after age 40. Talk to your health care provider about which screenings and vaccines you need and how often you need them. This information is not intended  to replace advice given to you by your health care provider. Make sure you discuss any questions you have with your  health care provider. Document Released: 01/09/2016 Document Revised: 09/01/2016 Document Reviewed: 10/14/2015 Elsevier Interactive Patient Education  2017 [employer].  Fall Prevention in the Home Falls can cause injuries. They can happen to people of all ages. There are many things you can do to make your home safe and to help prevent falls. What can I do on the outside of my home? Regularly fix the edges of walkways and driveways and fix any cracks. Remove anything that might make you trip as you walk through a door, such as a raised step or threshold. Trim any bushes or trees on the path to your home. Use bright outdoor lighting. Clear any walking paths of anything that might make someone trip, such as rocks or tools. Regularly check to see if handrails are loose or broken. Make sure that both sides of any steps have handrails. Any raised decks and porches should have guardrails on the edges. Have any leaves, snow, or ice cleared regularly. Use sand or salt on walking paths during winter. Clean up any spills in your garage right away. This includes oil or grease spills. What can I do in the bathroom? Use night lights. Install grab bars by the toilet and in the tub and shower. Do not use towel bars as grab bars. Use non-skid mats or decals in the tub or shower. If you need to sit down in the shower, use a plastic, non-slip stool. Keep the floor dry. Clean up any water that spills on the floor as soon as it happens. Remove soap buildup in the tub or shower regularly. Attach bath mats securely with double-sided non-slip rug tape. Do not have throw rugs and other things on the floor that can make you trip. What can I do in the bedroom? Use night lights. Make sure that you have a light by your bed that is easy to reach. Do not use any sheets or blankets that are too big for your bed. They should not hang down onto the floor. Have a firm chair that has side arms. You can use this for  support while you get dressed. Do not have throw rugs and other things on the floor that can make you trip. What can I do in the kitchen? Clean up any spills right away. Avoid walking on wet floors. Keep items that you use a lot in easy-to-reach places. If you need to reach something above you, use a strong step stool that has a grab bar. Keep electrical cords out of the way. Do not use floor polish or wax that makes floors slippery. If you must use wax, use non-skid floor wax. Do not have throw rugs and other things on the floor that can make you trip. What can I do with my stairs? Do not leave any items on the stairs. Make sure that there are handrails on both sides of the stairs and use them. Fix handrails that are broken or loose. Make sure that handrails are as long as the stairways. Check any carpeting to make sure that it is firmly attached to the stairs. Fix any carpet that is loose or worn. Avoid having throw rugs at the top or bottom of the stairs. If you do have throw rugs, attach them to the floor with carpet tape. Make sure that you have a light switch at the top of the stairs and  the bottom of the stairs. If you do not have them, ask someone to add them for you. What else can I do to help prevent falls? Wear shoes that: Do not have high heels. Have rubber bottoms. Are comfortable and fit you well. Are closed at the toe. Do not wear sandals. If you use a stepladder: Make sure that it is fully opened. Do not climb a closed stepladder. Make sure that both sides of the stepladder are locked into place. Ask someone to hold it for you, if possible. Clearly mark and make sure that you can see: Any grab bars or handrails. First and last steps. Where the edge of each step is. Use tools that help you move around (mobility aids) if they are needed. These include: Canes. Walkers. Scooters. Crutches. Turn on the lights when you go into a dark area. Replace any light bulbs as soon  as they burn out. Set up your furniture so you have a clear path. Avoid moving your furniture around. If any of your floors are uneven, fix them. If there are any pets around you, be aware of where they are. Review your medicines with your doctor. Some medicines can make you feel dizzy. This can increase your chance of falling. Ask your doctor what other things that you can do to help prevent falls. This information is not intended to replace advice given to you by your health care provider. Make sure you discuss any questions you have with your health care provider. Document Released: 10/09/2009 Document Revised: 05/20/2016 Document Reviewed: 01/17/2015 Elsevier Interactive Patient Education  2017 [employer].

## 2023-11-15 NOTE — Progress Notes (Signed)
Subjective:   Gabrielle [name] is a 72 y.o. female who presents for Medicare Annual (Subsequent) preventive examination.  Visit Complete: Virtual I connected with  KARYL PASKINS on 11/15/23 by a audio enabled telemedicine application and verified that I am speaking with the correct person using two identifiers.  Patient Location: Home  Provider Location: Home Office  I discussed the limitations of evaluation and management by telemedicine. The patient expressed understanding and agreed to proceed.  Vital Signs: Because this visit was a virtual/telehealth visit, some criteria may be missing or patient reported. Any vitals not documented were not able to be obtained and vitals that have been documented are patient reported.       Objective:    There were no vitals filed for this visit. There is no height or weight on file to calculate BMI.     11/15/2023    9:48 AM 09/21/2023   10:02 AM 06/07/2023   12:00 AM 02/26/2022   11:51 AM 09/01/2021   10:14 AM 08/13/2021    9:58 AM 07/28/2021    3:40 PM  Advanced Directives  Does Patient Have a Medical Advance Directive? No No No Yes No No No  Type of Advance Directive    Living will;Healthcare Power of Attorney     Does patient want to make changes to medical advance directive?    No - Patient declined     Copy of Healthcare Power of Attorney in Chart?    No - copy requested     Would patient like information on creating a medical advance directive? No - Patient declined No - Patient declined   No - Patient declined No - Patient declined No - Patient declined    Current Medications (verified) Outpatient Encounter Medications as of 11/15/2023  Medication Sig   ALPRAZolam (XANAX) 1 MG tablet TAKE 1 TABLET BY MOUTH 3 TIMES DAILY.   carboxymethylcellulose (REFRESH PLUS) 0.5 % SOLN Place 1 drop into both eyes 3 (three) times daily as needed (dry eyes).   Magnesium 250 MG TABS Take 1 tablet by mouth daily.   ondansetron (ZOFRAN ODT) 4 MG  disintegrating tablet 4mg  ODT q4 hours prn nausea/vomit   sucralfate (CARAFATE) 1 GM/10ML suspension TAKE 3 TIMES DAILY AS NEEDED (ACID REFLUS)   Vonoprazan Fumarate (VOQUEZNA) 20 MG TABS Take by mouth.   amLODipine (NORVASC) 5 MG tablet Take 5 mg by mouth daily. (Patient not taking: Reported on 10/06/2023)   esomeprazole (NEXIUM) 40 MG capsule TAKE 1 CAPSULE (40 MG TOTAL) BY MOUTH 2 (TWO) TIMES DAILY BEFORE A MEAL. (Patient not taking: Reported on 11/15/2023)   zolpidem (AMBIEN) 10 MG tablet TAKE 1 TABLET BY MOUTH AT BEDTIME AS NEEDED FOR SLEEP.   No facility-administered encounter medications on file as of 11/15/2023.    Allergies (verified) Iodinated contrast media, Other, Shellfish allergy, Gadolinium derivatives, Escitalopram, Hydrocodone, Magnesium hydroxide, Metronidazole, Acetazolamide, Gabapentin, and Mirtazapine   History: Past Medical History:  Diagnosis Date   Anemia    as a child   Anxiety    takes Xanax daily   Arthritis    Cataracts, bilateral    immature   Chronic insomnia 10/11/2014   Complication of anesthesia    Constipation    will occasionally take Milk of Mag   Diverticulosis    Dizziness    rarely   GERD (gastroesophageal reflux disease)    takes Omeprazole every other day   Glaucoma    borderline and no drops required  Hearing loss    but doesn't have hearing aids   History of bronchitis    many many yrs ago   History of colitis    History of colon polyps    Hypertension    hasn't been on meds for the past 27yrs    Insomnia    takes Trazodone nightly as needed and Ambien nightly    Insomnia due to substance 10/11/2014   Joint pain    Joint swelling    PONV (postoperative nausea and vomiting)    Stroke (HCC)    Tinnitus    takes HCTZ daily to decrease pressure in ears   Past Surgical History:  Procedure Laterality Date   ABDOMINAL HYSTERECTOMY     APPENDECTOMY     COLONOSCOPY     ESOPHAGOGASTRODUODENOSCOPY (EGD) WITH PROPOFOL N/A  07/29/2021   Procedure: ESOPHAGOGASTRODUODENOSCOPY (EGD) WITH PROPOFOL;  Surgeon: Charna Elizabeth, MD;  Location: [facility];  Service: Endoscopy;  Laterality: N/A;   HEMOSTASIS CLIP PLACEMENT  07/29/2021   Procedure: HEMOSTASIS CLIP PLACEMENT;  Surgeon: Charna Elizabeth, MD;  Location: [facility];  Service: Endoscopy;;   IR RADIOLOGIST EVAL & MGMT  06/08/2017   POLYPECTOMY  07/29/2021   Procedure: POLYPECTOMY;  Surgeon: Charna Elizabeth, MD;  Location: [facility];  Service: Endoscopy;;   tinnitus Right    TONSILLECTOMY     TOTAL KNEE ARTHROPLASTY Right 03/13/2014   DR MURPHY   TOTAL KNEE ARTHROPLASTY Right 03/13/2014   Procedure: TOTAL KNEE ARTHROPLASTY;  Surgeon: Loreta Ave, MD;  Location: [facility] OR;  Service: Orthopedics;  Laterality: Right;   TUBAL LIGATION     Family History  Problem Relation Age of Onset   Depression Mother    Cancer - Other Father    Kidney disease Father    Cancer - Other Sister        breast ca   Cancer - Other Brother        lymphoma in remission   Lymphoma Brother    Cancer - Other Sister        in remission breast ca   Social History   Socioeconomic History   Marital status: Divorced    Spouse name: Not on file   Number of children: 2   Years of education: Not on file   Highest education level: Not on file  Occupational History   Occupation: retired  Tobacco Use   Smoking status: Former    Current packs/day: 0.00    Average packs/day: 1 pack/day for 22.0 years (22.0 ttl pk-yrs)    Types: Cigarettes    Start date: 03/08/1972    Quit date: 01/08/1994    Years since quitting: 29.8   Smokeless tobacco: Never   Tobacco comments:    quit smoking 46yrs ago  Vaping Use   Vaping status: Never Used  Substance and Sexual Activity   Alcohol use: No    Alcohol/week: 0.0 standard drinks of alcohol   Drug use: No   Sexual activity: Not Currently    Birth control/protection: Surgical  Other Topics Concern   Not on file  Social History Narrative   Right  handed, Caffeine none, Divorced, 2 kids,  PT - Housing auth in [place],   13.5 yrs school; one level home   Social Determinants of Health   Financial Resource Strain: Low Risk  (11/15/2023)   Overall Financial Resource Strain (CARDIA)    Difficulty of Paying Living Expenses: Not hard at all  Food Insecurity: No Food Insecurity (11/15/2023)  Hunger Vital Sign    Worried About Running Out of Food in the Last Year: Never true    Ran Out of Food in the Last Year: Never true  Transportation Needs: No Transportation Needs (11/15/2023)   PRAPARE - Administrator, Civil Service (Medical): No    Lack of Transportation (Non-Medical): No  Physical Activity: Inactive (11/15/2023)   Exercise Vital Sign    Days of Exercise per Week: 0 days    Minutes of Exercise per Session: 0 min  Stress: Stress Concern Present (11/15/2023)   [employer] of Occupational Health - Occupational Stress Questionnaire    Feeling of Stress : To some extent  Social Connections: Socially Isolated (11/15/2023)   Social Connection and Isolation Panel [NHANES]    Frequency of Communication with Friends and Family: Once a week    Frequency of Social Gatherings with Friends and Family: Once a week    Attends Religious Services: More than 4 times per year    Active Member of [employer] or Organizations: No    Attends Engineer, structural: Never    Marital Status: Divorced    Tobacco Counseling Counseling given: Not Answered Tobacco comments: quit smoking 48yrs ago   Clinical Intake:  Pre-visit preparation completed: Yes  Pain : No/denies pain     Diabetes: No  How often do you need to have someone help you when you read instructions, pamphlets, or other written materials from your doctor or pharmacy?: 1 - Never  Interpreter Needed?: No  Information entered by :: Remi Haggard LPN   Activities of Daily Living    11/15/2023    9:49 AM  In your present state of health, do you have any  difficulty performing the following activities:  Hearing? 1  Vision? 0  Difficulty concentrating or making decisions? 0  Walking or climbing stairs? 1  Dressing or bathing? 1  Doing errands, shopping? 1  Preparing Food and eating ? Y  Using the Toilet? N  In the past six months, have you accidently leaked urine? N  Do you have problems with loss of bowel control? N  Managing your Medications? N  Managing your Finances? N  Housekeeping or managing your Housekeeping? Y    Patient Care Team: Myrlene Broker, MD as PCP - General (Internal Medicine) Drema Dallas, DO as Consulting Physician (Neurology) Drema Dallas, DO as Consulting Physician (Neurology) Sallye Lat, MD as Consulting Physician (Ophthalmology) Gospe, Su Monks III, MD as Referring Physician (Ophthalmology) Kathyrn Sheriff, [facility] (Inactive) as Pharmacist (Pharmacist) Luciana Axe Alford Highland, MD as Consulting Physician (Ophthalmology)  Indicate any recent Medical Services you may have received from other than [facility] providers in the past year (date may be approximate).     Assessment:   This is a routine wellness examination for Danyel.  Hearing/Vision screen Hearing Screening - Comments:: Tinnitus ears Bilateral Vision Screening - Comments:: [facility]   Goals Addressed             This Visit's Progress    Patient Stated   On track    Maintain current health status.      Patient Stated       Travel more Live more independent life Stop allowing things / people take peace       Depression Screen    11/15/2023   10:45 AM 10/06/2023    9:04 AM 06/13/2023    3:48 PM 05/03/2023   11:06 AM 03/15/2023    2:10  PM 01/28/2023    1:59 PM 12/15/2022   10:39 AM  PHQ 2/9 Scores  PHQ - 2 Score 5 5 0 0 0 0 0  PHQ- 9 Score 13 13 0 0 0 0 0    Fall Risk    11/15/2023    9:47 AM 06/13/2023    3:48 PM 05/03/2023   11:06 AM 03/15/2023    2:10 PM 01/28/2023    1:59 PM  Fall Risk   Falls in the  past year? 0 0 0 0 0  Number falls in past yr: 0 0 0 0 0  Injury with Fall? 0 0 0 0 0  Follow up Falls evaluation completed;Education provided;Falls prevention discussed Falls evaluation completed Falls evaluation completed Falls evaluation completed Falls evaluation completed    MEDICARE RISK AT HOME: Medicare Risk at Home Any stairs in or around the home?: Yes If so, are there any without handrails?: No Home free of loose throw rugs in walkways, pet beds, electrical cords, etc?: Yes Adequate lighting in your home to reduce risk of falls?: Yes Life alert?: Yes Use of a cane, walker or w/c?: Yes Grab bars in the bathroom?: Yes Shower chair or bench in shower?: Yes Elevated toilet seat or a handicapped toilet?: Yes  TIMED UP AND GO:  Was the test performed?  No    Cognitive Function:        11/15/2023    9:45 AM  6CIT Screen  What Year? 0 points  What month? 0 points  What time? 0 points  Count back from 20 0 points  Months in reverse 0 points  Repeat phrase 0 points  Total Score 0 points    Immunizations Immunization History  Administered Date(s) Administered   Fluad Trivalent(High Dose 65+) 10/06/2023   Influenza Split 08/28/2015, 09/26/2016   Influenza, High Dose Seasonal PF 09/22/2017, 11/05/2018, 11/28/2018   Influenza,inj,Quad PF,6+ Mos 09/27/2019, 10/09/2021, 10/04/2022   Influenza-Unspecified 08/27/2014, 09/26/2016, 09/18/2017, 09/26/2020, 12/27/2020, 09/26/2021, 09/26/2022   PFIZER(Purple Top)SARS-COV-2 Vaccination 03/27/2020, 04/22/2020, 10/23/2020   Tdap 12/27/2013   Zoster Recombinant(Shingrix) 08/08/2019    TDAP status: Up to date  Flu Vaccine status: Up to date  Pneumococcal vaccine status: Declined,  Education has been provided regarding the importance of this vaccine but patient still declined. Advised may receive this vaccine at local pharmacy or Health Dept. Aware to provide a copy of the vaccination record if obtained from local pharmacy or  Health Dept. Verbalized acceptance and understanding.   Covid-19 vaccine status: Declined, Education has been provided regarding the importance of this vaccine but patient still declined. Advised may receive this vaccine at local pharmacy or Health Dept.or vaccine clinic. Aware to provide a copy of the vaccination record if obtained from local pharmacy or Health Dept. Verbalized acceptance and understanding.  Qualifies for Shingles Vaccine?  Had a reaction to first shot        Zostavax completed No   Shingrix Completed?: No.    Education has been provided regarding the importance of this vaccine. Patient has been advised to call insurance company to determine out of pocket expense if they have not yet received this vaccine. Advised may also receive vaccine at local pharmacy or Health Dept. Verbalized acceptance and understanding.  Screening Tests Health Maintenance  Topic Date Due   COVID-19 Vaccine (4 - 2023-24 season) 12/01/2023 (Originally 08/28/2023)   Pneumonia Vaccine 82+ Years old (1 of 1 - PCV) 11/14/2024 (Originally 07/03/2016)   Hepatitis C Screening  11/14/2024 (Originally 07/03/1969)  DTaP/Tdap/Td (2 - Td or Tdap) 12/28/2023   Medicare Annual Wellness (AWV)  11/14/2024   MAMMOGRAM  05/15/2025   Colonoscopy  11/08/2033   INFLUENZA VACCINE  Completed   DEXA SCAN  Completed   HPV VACCINES  Aged Out   Zoster Vaccines- Shingrix  Discontinued    Health Maintenance  There are no preventive care reminders to display for this patient.   Colorectal cancer screening: Type of screening: Colonoscopy. Completed 2024. Repeat every 5 years  Mammogram status: Completed  . Repeat every year  Bone Density status: Completed 2023. Results reflect: Bone density results: OSTEOPENIA. Repeat every 2 years.  Lung Cancer Screening: (Low Dose CT Chest recommended if Age 22-80 years, 20 pack-year currently smoking OR have quit w/in 15years.) does not qualify.   Lung Cancer Screening Referral:    Additional Screening:  Hepatitis C Screening  never done  Vision Screening: Recommended annual ophthalmology exams for early detection of glaucoma and other disorders of the eye. Is the patient up to date with their annual eye exam?  Yes  Who is the provider or what is the name of the office in which the patient attends annual eye exams? [facility] If pt is not established with a provider, would they like to be referred to a provider to establish care? No .   Dental Screening: Recommended annual dental exams for proper oral hygiene   Community Resource Referral / Chronic Care Management: CRR required this visit?  No   CCM required this visit?  No     Plan:     I have personally reviewed and noted the following in the patient's chart:   Medical and social history Use of alcohol, tobacco or illicit drugs  Current medications and supplements including opioid prescriptions. Patient is not currently taking opioid prescriptions. Functional ability and status Nutritional status Physical activity Advanced directives List of other physicians Hospitalizations, surgeries, and ER visits in previous 12 months Vitals Screenings to include cognitive, depression, and falls Referrals and appointments  In addition, I have reviewed and discussed with patient certain preventive protocols, quality metrics, and best practice recommendations. A written personalized care plan for preventive services as well as general preventive health recommendations were provided to patient.     Remi Haggard, LPN   [id number]   After Visit Summary: (MyChart) Due to this being a telephonic visit, the after visit summary with patients personalized plan was offered to patient via MyChart   Nurse Notes:

## 2023-11-16 DIAGNOSIS — Z961 Presence of intraocular lens: Secondary | ICD-10-CM | POA: Diagnosis not present

## 2023-11-16 DIAGNOSIS — H02115 Cicatricial ectropion of left lower eyelid: Secondary | ICD-10-CM | POA: Diagnosis not present

## 2023-11-16 DIAGNOSIS — H02112 Cicatricial ectropion of right lower eyelid: Secondary | ICD-10-CM | POA: Diagnosis not present

## 2023-11-16 DIAGNOSIS — H40021 Open angle with borderline findings, high risk, right eye: Secondary | ICD-10-CM | POA: Diagnosis not present

## 2023-11-16 DIAGNOSIS — H47292 Other optic atrophy, left eye: Secondary | ICD-10-CM | POA: Diagnosis not present

## 2023-11-16 DIAGNOSIS — H04123 Dry eye syndrome of bilateral lacrimal glands: Secondary | ICD-10-CM | POA: Diagnosis not present

## 2023-11-17 NOTE — Telephone Encounter (Signed)
Pt has questions about this mediation that they prescribed her.It was for the burning sensation she was having but after completing the medicine she is still having the burning sensation and I explained to her that she may need to give them a call and let them know. The quesiton to you she wanted to know is for the ct scan she asked if she could get in done in mid April?  Vonoprazan Fumarate (VOQUEZNA) 20 MG TABS T

## 2023-11-18 NOTE — Telephone Encounter (Signed)
Explained to patient that she would have to discuss that with GI and she verbalized she understood and for the CT scan I left her voicemail letting her know that she would need to get it done before the end of april

## 2023-11-18 NOTE — Telephone Encounter (Signed)
I don't think I will be able to answer about medication she has. She should ask GI about that since I do not have notes from them to explain what or why they are treating her. For the CT lung timing of scan would be between 12/2023-03/2024.

## 2023-11-21 DIAGNOSIS — H6123 Impacted cerumen, bilateral: Secondary | ICD-10-CM | POA: Diagnosis not present

## 2023-12-07 LAB — LAB REPORT - SCANNED: A1c: 5.3

## 2023-12-26 ENCOUNTER — Encounter: Payer: Self-pay | Admitting: Internal Medicine

## 2023-12-26 ENCOUNTER — Ambulatory Visit (INDEPENDENT_AMBULATORY_CARE_PROVIDER_SITE_OTHER): Payer: PPO | Admitting: Internal Medicine

## 2023-12-26 VITALS — BP 126/74 | HR 78 | Temp 98.0°F | Ht 64.0 in | Wt 221.0 lb

## 2023-12-26 DIAGNOSIS — F419 Anxiety disorder, unspecified: Secondary | ICD-10-CM

## 2023-12-26 DIAGNOSIS — F32A Depression, unspecified: Secondary | ICD-10-CM

## 2023-12-26 DIAGNOSIS — F5104 Psychophysiologic insomnia: Secondary | ICD-10-CM | POA: Diagnosis not present

## 2023-12-26 DIAGNOSIS — K219 Gastro-esophageal reflux disease without esophagitis: Secondary | ICD-10-CM

## 2023-12-26 DIAGNOSIS — R030 Elevated blood-pressure reading, without diagnosis of hypertension: Secondary | ICD-10-CM | POA: Diagnosis not present

## 2023-12-26 MED ORDER — ESOMEPRAZOLE MAGNESIUM 40 MG PO CPDR: 40.0000 mg | DELAYED_RELEASE_CAPSULE | Freq: Two times a day (BID) | ORAL | 3 refills | Status: AC

## 2023-12-27 NOTE — Assessment & Plan Note (Signed)
We continue to closely monitor BP and due to arm she struggles with finding a home cuff to get accurate readings. Hers was off about 10-15 points when compared to manual today in office.

## 2023-12-27 NOTE — Assessment & Plan Note (Signed)
Continues with Remus Loffler and this still helping well.

## 2023-12-27 NOTE — Assessment & Plan Note (Signed)
Uses alprazolam 1 mg TID and this helps due to tinnitus. She is stable and has tried many other treatment options. We will continue.

## 2023-12-27 NOTE — Assessment & Plan Note (Signed)
GI recently tried a new medication for this voquenza and this did not help. She has gone back to nexium 40 mg BID and this was refilled today through [place].

## 2024-01-05 DIAGNOSIS — H9313 Tinnitus, bilateral: Secondary | ICD-10-CM | POA: Diagnosis not present

## 2024-01-13 ENCOUNTER — Ambulatory Visit: Payer: Non-veteran care | Admitting: Internal Medicine

## 2024-01-21 ENCOUNTER — Other Ambulatory Visit: Payer: Self-pay | Admitting: Internal Medicine

## 2024-01-23 ENCOUNTER — Encounter: Payer: Self-pay | Admitting: Internal Medicine

## 2024-01-23 ENCOUNTER — Ambulatory Visit (INDEPENDENT_AMBULATORY_CARE_PROVIDER_SITE_OTHER): Payer: PPO | Admitting: Internal Medicine

## 2024-01-23 ENCOUNTER — Telehealth: Payer: Self-pay

## 2024-01-23 VITALS — BP 126/66 | HR 67 | Temp 98.1°F | Ht 64.0 in | Wt 222.0 lb

## 2024-01-23 DIAGNOSIS — F32A Depression, unspecified: Secondary | ICD-10-CM

## 2024-01-23 DIAGNOSIS — F5104 Psychophysiologic insomnia: Secondary | ICD-10-CM

## 2024-01-23 DIAGNOSIS — H579 Unspecified disorder of eye and adnexa: Secondary | ICD-10-CM | POA: Diagnosis not present

## 2024-01-23 DIAGNOSIS — H02106 Unspecified ectropion of left eye, unspecified eyelid: Secondary | ICD-10-CM | POA: Diagnosis not present

## 2024-01-23 DIAGNOSIS — H5462 Unqualified visual loss, left eye, normal vision right eye: Secondary | ICD-10-CM | POA: Diagnosis not present

## 2024-01-23 DIAGNOSIS — R030 Elevated blood-pressure reading, without diagnosis of hypertension: Secondary | ICD-10-CM

## 2024-01-23 DIAGNOSIS — F4312 Post-traumatic stress disorder, chronic: Secondary | ICD-10-CM | POA: Diagnosis not present

## 2024-01-23 DIAGNOSIS — F419 Anxiety disorder, unspecified: Secondary | ICD-10-CM | POA: Diagnosis not present

## 2024-01-23 MED ORDER — ALPRAZOLAM 1 MG PO TABS: 1.0000 mg | ORAL_TABLET | Freq: Three times a day (TID) | ORAL | 5 refills | Status: DC

## 2024-01-23 MED ORDER — SUCRALFATE 1 GM/10ML PO SUSP: 2.0000 g | Freq: Three times a day (TID) | ORAL | 2 refills | Status: DC

## 2024-01-23 MED ORDER — ZOLPIDEM TARTRATE 10 MG PO TABS: 10.0000 mg | ORAL_TABLET | Freq: Every evening | ORAL | 5 refills | Status: DC | PRN

## 2024-01-23 NOTE — Progress Notes (Unsigned)
   Subjective:   Patient ID: Gabrielle [name], female    DOB: 1951/07/07, 73 y.o.   MRN: [id number]  HPI The patient is a 73 YO female coming in for concerns about recent episode of eye pressure. Went to [place] and they did labs and want her to do a temporal artery biopsy. Her ESR was 10 and hsCRP 4.4. She has not had episode in last 1-2 days. Denies change in vision but has poor vision. Also questions about cholesterol.   Review of Systems  Constitutional: Negative.   HENT: Negative.    Eyes: Negative.   Respiratory:  Negative for cough, chest tightness and shortness of breath.   Cardiovascular:  Negative for chest pain, palpitations and leg swelling.  Gastrointestinal:  Negative for abdominal distention, abdominal pain, constipation, diarrhea, nausea and vomiting.  Musculoskeletal: Negative.   Skin: Negative.   Neurological: Negative.   Psychiatric/Behavioral: Negative.      Objective:  Physical Exam Constitutional:      Appearance: She is well-developed.  HENT:     Head: Normocephalic and atraumatic.  Cardiovascular:     Rate and Rhythm: Normal rate and regular rhythm.  Pulmonary:     Effort: Pulmonary effort is normal. No respiratory distress.     Breath sounds: Normal breath sounds. No wheezing or rales.  Abdominal:     General: Bowel sounds are normal. There is no distension.     Palpations: Abdomen is soft.     Tenderness: There is no abdominal tenderness. There is no rebound.  Musculoskeletal:     Cervical back: Normal range of motion.  Skin:    General: Skin is warm and dry.  Neurological:     Mental Status: She is alert and oriented to person, place, and time.     Coordination: Coordination normal.     Vitals:   01/23/24 1610  BP: 126/66  Pulse: 67  Temp: 98.1 F (36.7 C)  TempSrc: Oral  SpO2: 98%  Weight: 222 lb (100.7 kg)  Height: 5\' 4"  (1.626 m)    Assessment & Plan:  Visit time 20 minutes in face to face communication with patient and coordination of  care, additional 10 minutes spent in record review, coordination or care, ordering tests, communicating/referring to other healthcare professionals, documenting in medical records all on the same day of the visit for total time 30 minutes spent on the visit.

## 2024-01-23 NOTE — Patient Instructions (Signed)
We will get you in with Dr. Threasa Heads.   Keep a close watch on the pressure near the eye and let us know if this is going away or continuing.

## 2024-01-24 NOTE — Telephone Encounter (Signed)
As we discussed during visit I do not think she needs the biopsy. She is to monitor for the pressure in the eye/face region and if present and consistent to contact us. The cholesterol numbers were fine as we discussed I do not think she needs to make changes.

## 2024-01-25 ENCOUNTER — Telehealth: Payer: Self-pay

## 2024-01-25 NOTE — Telephone Encounter (Signed)
Called and lvm for patient. Patient agreed to leaving her a very detailed message

## 2024-01-25 NOTE — Telephone Encounter (Signed)
Copied from CRM [id number]. Topic: Clinical - Medical Advice >> Jan 25, 2024  1:53 PM Corin V wrote: Reason for CRM: Patient returned missed call for [facility]. Please call patient back.

## 2024-01-26 ENCOUNTER — Encounter: Payer: Self-pay | Admitting: Internal Medicine

## 2024-01-26 DIAGNOSIS — H579 Unspecified disorder of eye and adnexa: Secondary | ICD-10-CM | POA: Insufficient documentation

## 2024-01-26 NOTE — Assessment & Plan Note (Signed)
Could be related to her eye conditions. At [facility] labs done including ESR and hsCRP. She had minimally elevated CRP and normal ESR. This does not seem consistent with temporal arteritis. She does not have temporal tenderness. She has not had recurrence. She is asked to monitor closely for recurrence. If persistent and daily we will refer for temporal artery biopsy. She does not want to do this through [place].

## 2024-01-26 NOTE — Assessment & Plan Note (Signed)
Overall stable and driven some by tinnitus. She is using alprazolam 1 mg TID prn and has been unable to tolerate several other medications. Continue and refilled at today's visit.

## 2024-01-26 NOTE — Assessment & Plan Note (Signed)
Using ambien 10 mg at bedtime and is aware of risk/benefit. She wishes to continue her tinnitus does keep her from falling asleep easily at night time and Remus Loffler is able to help. Refilled.

## 2024-01-26 NOTE — Assessment & Plan Note (Addendum)
BP is normal and will continue monitoring at every visit. Cholesterol panel reviewed and acceptable without change.

## 2024-01-26 NOTE — Assessment & Plan Note (Signed)
She is still doing some counseling and using alprazolam 1 mg TID prn. She has tried other agents without success and has been able to taper off alprazolam a few times. She is working on this again dealing with tinnitus which triggers her anxiety often.

## 2024-01-31 NOTE — Telephone Encounter (Signed)
I have left a detailed message as she requested regarding Dr Lawana Chambers advice

## 2024-02-17 DIAGNOSIS — H9113 Presbycusis, bilateral: Secondary | ICD-10-CM | POA: Diagnosis not present

## 2024-02-17 DIAGNOSIS — H9313 Tinnitus, bilateral: Secondary | ICD-10-CM | POA: Diagnosis not present

## 2024-02-21 DIAGNOSIS — K449 Diaphragmatic hernia without obstruction or gangrene: Secondary | ICD-10-CM | POA: Diagnosis not present

## 2024-02-21 DIAGNOSIS — K2100 Gastro-esophageal reflux disease with esophagitis, without bleeding: Secondary | ICD-10-CM | POA: Diagnosis not present

## 2024-02-21 DIAGNOSIS — K5900 Constipation, unspecified: Secondary | ICD-10-CM | POA: Diagnosis not present

## 2024-02-21 DIAGNOSIS — K5730 Diverticulosis of large intestine without perforation or abscess without bleeding: Secondary | ICD-10-CM | POA: Diagnosis not present

## 2024-02-21 DIAGNOSIS — Z860100 Personal history of colon polyps, unspecified: Secondary | ICD-10-CM | POA: Diagnosis not present

## 2024-03-02 DIAGNOSIS — M1712 Unilateral primary osteoarthritis, left knee: Secondary | ICD-10-CM | POA: Diagnosis not present

## 2024-03-07 DIAGNOSIS — H401111 Primary open-angle glaucoma, right eye, mild stage: Secondary | ICD-10-CM | POA: Diagnosis not present

## 2024-03-07 DIAGNOSIS — Z961 Presence of intraocular lens: Secondary | ICD-10-CM | POA: Diagnosis not present

## 2024-03-07 DIAGNOSIS — H47012 Ischemic optic neuropathy, left eye: Secondary | ICD-10-CM | POA: Diagnosis not present

## 2024-03-07 DIAGNOSIS — H40052 Ocular hypertension, left eye: Secondary | ICD-10-CM | POA: Diagnosis not present

## 2024-03-07 DIAGNOSIS — H02822 Cysts of right lower eyelid: Secondary | ICD-10-CM | POA: Diagnosis not present

## 2024-03-09 DIAGNOSIS — M1712 Unilateral primary osteoarthritis, left knee: Secondary | ICD-10-CM | POA: Diagnosis not present

## 2024-03-16 DIAGNOSIS — M1712 Unilateral primary osteoarthritis, left knee: Secondary | ICD-10-CM | POA: Diagnosis not present

## 2024-03-27 ENCOUNTER — Telehealth: Payer: Self-pay | Admitting: Internal Medicine

## 2024-03-27 NOTE — Telephone Encounter (Signed)
 Copied from CRM [id number]. Topic: General - Other >> Mar 27, 2024 10:20 AM Arley Phenix D wrote: Reason for CRM: Claris Gower from [facility] is calling in regards to the patient's appointment for tomorrow. Claris Gower stated that they will have to cancel the appointment because they haven't received prior authorization from the clinic.

## 2024-03-27 NOTE — Telephone Encounter (Signed)
 I have not received anything from them on behalf of the patient

## 2024-03-28 ENCOUNTER — Other Ambulatory Visit: Payer: Non-veteran care

## 2024-03-28 NOTE — Telephone Encounter (Signed)
 Copied from CRM [id number]. Topic: Clinical - Medical Advice >> Mar 28, 2024 12:12 PM Melissa C wrote: Reason for CRM: patient thought she was supposed to be getting an x-ray for her neck today because she stated she remembers talking about the nodules in her neck and doctor wanting to follow up on xray in a couple of months. I let her know that from what I could see (I also called [facility] and they said the same) the doctor's notes stated that patient needs a CT scan. She was not happy about this because she didn't think it was thorough enough. She also didn't understand prior authorization process. I explained that prior authorization for procedures can take a bit of time through insurance etc. The imaging office more than likely would not let you get the procedure without prior authorization or if you get it without prior authorization that is out of your pocket completely. That made her understand a bit but she is still wanting this CT scan right now because she is going on a trip. I let her know I would message her doctor with high priority so they can talk to her about everything and explain everything for her. Please advise with patient. Thank you.

## 2024-03-28 NOTE — Telephone Encounter (Signed)
 Copied from CRM [id number]. Topic: Clinical - Request for Lab/Test Order >> Mar 28, 2024  1:19 PM Marlow Baars wrote: Reason for CRM: The patient called back to make sure this PA is done as soon as possible by this Friday as she has to go out of town. She went to [facility] today thinking she was going to get her scan done and she had the whole day available today so she was upset cause she is already worried about what may be found on the scan. Please assist her as soon as possible as she thought she could just use her Medicare and not even have to worry about her VA but she understand this way there will be no out of pocket expenses. She wants to get it done at [facility] local and not have to go to the [place] to get it done.

## 2024-03-28 NOTE — Telephone Encounter (Signed)
 Pt's order was placed 10.10.24, from [facility] notes 'Lung nodule detected on CT neck needs follow up '.   Please start PA for pt's CT.

## 2024-03-29 DIAGNOSIS — H9313 Tinnitus, bilateral: Secondary | ICD-10-CM | POA: Diagnosis not present

## 2024-03-29 DIAGNOSIS — H9113 Presbycusis, bilateral: Secondary | ICD-10-CM | POA: Diagnosis not present

## 2024-03-29 NOTE — Telephone Encounter (Signed)
 Copied from CRM [id number]. Topic: Referral - Prior Authorization Question >> Mar 29, 2024 10:34 AM Saverio Danker wrote: Reason for CRM: Patient is calling in because of an authorization needed for her cat scan. Patient is a little irate. She would like someone to contact her

## 2024-03-29 NOTE — Telephone Encounter (Signed)
>>   Mar 28, 2024  4:45 PM Gabrielle [name] wrote: Patient is calling to check on Prior Authorization for CT Scan. Advised patient message was sent over to nurse. Please call patient back as soon as possible, she wants to know who are we waiting on a Prior Authorization from. If it is from the [place] that is incorrect, it should go through Medicare. She states that she has not received a callback yet. She is confused.

## 2024-03-29 NOTE — Telephone Encounter (Signed)
 Message sent to referral coordinator to check on status of the prior auth.

## 2024-03-30 ENCOUNTER — Ambulatory Visit
Admission: RE | Admit: 2024-03-30 | Discharge: 2024-03-30 | Disposition: A | Discharge status: Home / Self Care | Source: Ambulatory Visit | Attending: Internal Medicine | Admitting: Internal Medicine

## 2024-03-30 DIAGNOSIS — R918 Other nonspecific abnormal finding of lung field: Secondary | ICD-10-CM

## 2024-03-30 DIAGNOSIS — R911 Solitary pulmonary nodule: Secondary | ICD-10-CM | POA: Diagnosis not present

## 2024-04-02 ENCOUNTER — Encounter: Payer: Self-pay | Admitting: Internal Medicine

## 2024-04-03 ENCOUNTER — Ambulatory Visit (INDEPENDENT_AMBULATORY_CARE_PROVIDER_SITE_OTHER): Admitting: Internal Medicine

## 2024-04-03 ENCOUNTER — Encounter: Payer: Self-pay | Admitting: Internal Medicine

## 2024-04-03 VITALS — BP 136/82 | HR 65 | Temp 98.0°F | Ht 64.0 in | Wt 228.0 lb

## 2024-04-03 DIAGNOSIS — E041 Nontoxic single thyroid nodule: Secondary | ICD-10-CM | POA: Diagnosis not present

## 2024-04-03 DIAGNOSIS — R918 Other nonspecific abnormal finding of lung field: Secondary | ICD-10-CM | POA: Diagnosis not present

## 2024-04-03 DIAGNOSIS — H93A3 Pulsatile tinnitus, bilateral: Secondary | ICD-10-CM

## 2024-04-03 MED ORDER — SUCRALFATE 1 GM/10ML PO SUSP: 2.0000 g | Freq: Three times a day (TID) | ORAL | 2 refills | Status: DC

## 2024-04-03 NOTE — Progress Notes (Unsigned)
   Subjective:   Patient ID: Gabrielle [name], female    DOB: 1951/07/09, 73 y.o.   MRN: [id number]  HPI The patient is a 73 YO female coming in for BP follow up and chest CT results discussion and overall health  Review of Systems  Constitutional: Negative.   HENT:  Positive for tinnitus.   Eyes: Negative.   Respiratory:  Negative for cough, chest tightness and shortness of breath.   Cardiovascular:  Negative for chest pain, palpitations and leg swelling.  Gastrointestinal:  Positive for abdominal pain. Negative for abdominal distention, constipation, diarrhea, nausea and vomiting.  Musculoskeletal:  Positive for arthralgias.  Skin: Negative.   Neurological: Negative.   Psychiatric/Behavioral: Negative.      Objective:  Physical Exam Constitutional:      Appearance: She is well-developed.  HENT:     Head: Normocephalic and atraumatic.  Cardiovascular:     Rate and Rhythm: Normal rate and regular rhythm.  Pulmonary:     Effort: Pulmonary effort is normal. No respiratory distress.     Breath sounds: Normal breath sounds. No wheezing or rales.  Abdominal:     General: Bowel sounds are normal. There is no distension.     Palpations: Abdomen is soft.     Tenderness: There is no abdominal tenderness. There is no rebound.  Musculoskeletal:     Cervical back: Normal range of motion.  Skin:    General: Skin is warm and dry.  Neurological:     Mental Status: She is alert and oriented to person, place, and time.     Coordination: Coordination normal.     Vitals:   04/03/24 1040 04/03/24 1114  BP:  136/82  Pulse: 65   Temp: 98 F (36.7 C)   TempSrc: Oral   SpO2: 97%   Weight: 228 lb (103.4 kg)   Height: 5\' 4"  (1.626 m)     Assessment & Plan:  Visit time 20 minutes in face to face communication with patient and coordination of care, additional 10 minutes spent in record review, coordination or care, ordering tests, communicating/referring to other healthcare professionals,  documenting in medical records all on the same day of the visit for total time 30 minutes spent on the visit.

## 2024-04-03 NOTE — Patient Instructions (Signed)
 We will check the thyroid ultrasound.

## 2024-04-05 DIAGNOSIS — E041 Nontoxic single thyroid nodule: Secondary | ICD-10-CM | POA: Insufficient documentation

## 2024-04-05 NOTE — Telephone Encounter (Signed)
 This was addressed ad already done

## 2024-04-05 NOTE — Assessment & Plan Note (Signed)
 Discussed finding of left sided increase in thyroid size and need for US thyroid to assess. She agrees and this is ordered today.

## 2024-04-05 NOTE — Assessment & Plan Note (Signed)
 Discussion CT results and need for repeat CT scan 02/2026.

## 2024-04-05 NOTE — Assessment & Plan Note (Signed)
 We talked about how this on left sided could be impacted by increased size of thyroid.

## 2024-04-09 ENCOUNTER — Telehealth: Payer: Self-pay | Admitting: Internal Medicine

## 2024-04-09 NOTE — Telephone Encounter (Signed)
 Copied from CRM [id number]. Topic: Appointments - Appointment Scheduling >> Apr 09, 2024 11:23 AM Gabrielle [name] wrote: Patient called to schedule Thyroid US , says she never received a call to schedule. Please call her at [phone]  ---  Is there a specific facility the pt needs to call for her US  appointment?

## 2024-04-09 NOTE — Telephone Encounter (Signed)
 Look as if its going be done at [facility]

## 2024-04-12 NOTE — Telephone Encounter (Signed)
 Spoke with patient, gave her the phone number for [facility] department

## 2024-04-17 ENCOUNTER — Ambulatory Visit (HOSPITAL_COMMUNITY)
Admission: RE | Admit: 2024-04-17 | Discharge: 2024-04-17 | Disposition: A | Discharge status: Home / Self Care | Source: Ambulatory Visit | Attending: Internal Medicine | Admitting: Internal Medicine

## 2024-04-17 DIAGNOSIS — E042 Nontoxic multinodular goiter: Secondary | ICD-10-CM | POA: Diagnosis not present

## 2024-04-17 DIAGNOSIS — E041 Nontoxic single thyroid nodule: Secondary | ICD-10-CM | POA: Diagnosis not present

## 2024-04-18 ENCOUNTER — Telehealth: Payer: Self-pay

## 2024-04-18 NOTE — Telephone Encounter (Signed)
 Copied from CRM [id number]. Topic: Clinical - Lab/Test Results >> Apr 18, 2024  7:42 AM Dimple Francis wrote: Reason for CRM: Patient wanting a call about labs that were taken yesterday and also to go over a previous chest xray

## 2024-04-18 NOTE — Telephone Encounter (Signed)
 Patient had labs done at the [place] we do not have records nor access to this unless the patient bring them to us  to have the provider review and if she is referring to the Ultrasound done for Thyroid  this has not came back yet as it was just done yesterday

## 2024-04-19 NOTE — Telephone Encounter (Signed)
 Copied from CRM [id number]. Topic: Clinical - Lab/Test Results >> Apr 18, 2024  3:38 PM Zipporah Him wrote: Patient calling again about US  results, advised her they are not released and she will get a call when they are available.

## 2024-04-20 ENCOUNTER — Other Ambulatory Visit: Payer: Self-pay

## 2024-04-20 ENCOUNTER — Ambulatory Visit: Payer: Self-pay

## 2024-04-20 ENCOUNTER — Telehealth: Payer: Self-pay

## 2024-04-20 ENCOUNTER — Telehealth: Payer: Self-pay | Admitting: Internal Medicine

## 2024-04-20 ENCOUNTER — Ambulatory Visit (INDEPENDENT_AMBULATORY_CARE_PROVIDER_SITE_OTHER): Admitting: Nurse Practitioner

## 2024-04-20 VITALS — BP 140/80 | HR 65 | Temp 97.7°F | Ht 64.0 in | Wt 223.5 lb

## 2024-04-20 DIAGNOSIS — R059 Cough, unspecified: Secondary | ICD-10-CM | POA: Insufficient documentation

## 2024-04-20 LAB — POCT RESPIRATORY SYNCYTIAL VIRUS: RSV Rapid Ag: NEGATIVE

## 2024-04-20 LAB — POC COVID19 BINAXNOW: SARS Coronavirus 2 Ag: NEGATIVE

## 2024-04-20 LAB — POCT INFLUENZA A/B
Influenza A, POC: NEGATIVE
Influenza B, POC: NEGATIVE

## 2024-04-20 MED ORDER — ALBUTEROL SULFATE HFA 108 (90 BASE) MCG/ACT IN AERS
2.0000 | INHALATION_SPRAY | Freq: Four times a day (QID) | RESPIRATORY_TRACT | 0 refills | Status: AC | PRN
Start: 2024-04-20 — End: ?

## 2024-04-20 MED ORDER — ALBUTEROL SULFATE HFA 108 (90 BASE) MCG/ACT IN AERS
2.0000 | INHALATION_SPRAY | Freq: Four times a day (QID) | RESPIRATORY_TRACT | 0 refills | Status: DC | PRN
Start: 2024-04-20 — End: 2024-04-20

## 2024-04-20 NOTE — Telephone Encounter (Signed)
 Chief Complaint: cough Symptoms: cough w/ chest tightness Frequency: since 4/22 Pertinent Negatives: Patient denies fever, SOB, difficulty breathing, sweating, dizziness Disposition: [] ED /[] Urgent Care (no appt availability in office) / [x] Appointment(In office/virtual)/ []  [facility]/ [] Home Care/ [] Refused Recommended Disposition /[] [facility]/ []  Follow-up with PCP Additional Notes: Pt reports cough and "chest tightness" since 4/22. Pt reports phlegm that "gets hung." Pt states she cannot get the phlegm up. Pt states it feels like she has a lump of coal in her throat and is talking over it. Pt was already scheduled for today at 1000. Pt plans to attend that appt but was hoping she would not need to. RN advised pt it is best she be seen today. Pt agreeable to do that. RN advised pt if she develops CP or chest pressure or SOB to call 911. Pt verbalized understanding.  Pt also very anxious about the results of her US  Thyroid . Results are not ready yet. Pt's cough started after her US  thyroid  on 4/22.     Copied from CRM [id number]. Topic: Clinical - Red Word Triage >> Apr 20, 2024  8:29 AM Zipporah Him wrote: Red Word that prompted transfer to Nurse Triage: Patient is having chest tightness, cough. No fever no shortness of breath. Dry cough mostly. Reason for Disposition  SEVERE coughing spells (e.g., whooping sound after coughing, vomiting after coughing)  Answer Assessment - Initial Assessment Questions 1. LOCATION: "Where does it hurt?"       Cough started 4/22 after US  Thyroid  2. RADIATION: "Does the pain go anywhere else?" (e.g., into neck, jaw, arms, back)     Does not radiate  3. ONSET: "When did the chest pain begin?" (Minutes, hours or days)      Cough started "the night after the ultrasound, I woke up with tightness in my chest" (4/22) 4. PATTERN: "Does the pain come and go, or has it been constant since it started?"  "Does it get worse with exertion?"       Constant and worsening 5. DURATION: "How long does it last" (e.g., seconds, minutes, hours)     Constant - it might loosen up, but is still there and is worsening 6. SEVERITY: "How bad is the pain?"  (e.g., Scale 1-10; mild, moderate, or severe)    - MILD (1-3): doesn't interfere with normal activities     - MODERATE (4-7): interferes with normal activities or awakens from sleep    - SEVERE (8-10): excruciating pain, unable to do any normal activities       7/10 - constant 7. CARDIAC RISK FACTORS: "Do you have any history of heart problems or risk factors for heart disease?" (e.g., angina, prior heart attack; diabetes, high blood pressure, high cholesterol, smoker, or strong family history of heart disease)     Mitral valve regurgitation 8. PULMONARY RISK FACTORS: "Do you have any history of lung disease?"  (e.g., blood clots in lung, asthma, emphysema, birth control pills)     Lung nodule  9. CAUSE: "What do you think is causing the chest pain?"     Not sure  10. OTHER SYMPTOMS: "Do you have any other symptoms?" (e.g., dizziness, nausea, vomiting, sweating, fever, difficulty breathing, cough)       "Chest tightness", "phlegm gets hung", states she is unable to expel the phlegm. Anxiety about unknown test results. Cough is severe. "Talking over a lump of coal in my throat." No SOB. No fever. No N/V, dizziness, sweating. Endorses loss of appetite since  she got sick on 4/22. Had 8 glasses of water yesterday and ate yesterday afternoon. Has not eaten today.  Answer Assessment - Initial Assessment Questions 1. ONSET: "When did the cough begin?"      4/22 2. SEVERITY: "How bad is the cough today?"      Coughing often 3. SPUTUM: "Describe the color of your sputum" (none, dry cough; clear, white, yellow, green)     Phlegm "gets hung" 4. HEMOPTYSIS: "Are you coughing up any blood?" If so ask: "How much?" (flecks, streaks, tablespoons, etc.)     no 5. DIFFICULTY BREATHING: "Are you having difficulty  breathing?" If Yes, ask: "How bad is it?" (e.g., mild, moderate, severe)    - MILD: No SOB at rest, mild SOB with walking, speaks normally in sentences, can lie down, no retractions, pulse < 100.    - MODERATE: SOB at rest, SOB with minimal exertion and prefers to sit, cannot lie down flat, speaks in phrases, mild retractions, audible wheezing, pulse 100-120.    - SEVERE: Very SOB at rest, speaks in single words, struggling to breathe, sitting hunched forward, retractions, pulse > 120      no 6. FEVER: "Do you have a fever?" If Yes, ask: "What is your temperature, how was it measured, and when did it start?"     no 7. CARDIAC HISTORY: "Do you have any history of heart disease?" (e.g., heart attack, congestive heart failure)      Mitral valve regurg 8. LUNG HISTORY: "Do you have any history of lung disease?"  (e.g., pulmonary embolus, asthma, emphysema)     Lung nodule 9. PE RISK FACTORS: "Do you have a history of blood clots?" (or: recent major surgery, recent prolonged travel, bedridden)     No  10. OTHER SYMPTOMS: "Do you have any other symptoms?" (e.g., runny nose, wheezing, chest pain)       Cough, chest tightness, "phlegm gets hung", loss of appetite since 4/22  Protocols used: Chest Pain-A-AH, Cough - Acute Productive-A-AH

## 2024-04-20 NOTE — Telephone Encounter (Signed)
 Done

## 2024-04-20 NOTE — Telephone Encounter (Signed)
 I have made another separate encounter regarding this to Dr Nicolette Barrio

## 2024-04-20 NOTE — Telephone Encounter (Signed)
 Copied from CRM [id number]. Topic: Clinical - Medication Question >> Apr 20, 2024 12:14 PM Aisha D wrote: Reason for CRM: Patient stated that she had an appointment today with Adella Agee, NP, and was prescribed some medication. Patient would like for the medication to be sent to the [id number] - [place], [place] - [address] AT [facility] OF [address] instead.

## 2024-04-20 NOTE — Assessment & Plan Note (Addendum)
 Acute, vital signs stable, oxygen saturation 97% on room air. Point-of-care COVID, RSV, flu: negative Likely seasonal allergies causing symptoms.  Possible viral etiology as well.  For now we will treat with albuterol  inhaler, Mucinex  for expectorant, and hydrate well throughout the day. Call office if symptoms persist or worsen.

## 2024-04-20 NOTE — Progress Notes (Signed)
 Established Patient Office Visit  Subjective   Patient ID: Gabrielle [name], female    DOB: 1951-07-16  Age: 73 y.o. MRN: [id number]  Chief Complaint  Patient presents with   Cough    Patient was today for acute visit for the above.  She is a 73 year old female with past medical history that is significant for anemia, anxiety, diverticulosis, GERD, glaucoma, bronchitis, hypertension, stroke.  Over the last week she has been experiencing cough, congestion, chest tightness.  Has produced some sputum but reports it is hard to expectorate it.  She does not feel exceptionally short of breath or fatigue.  She does report she is prone to seasonal allergies.  She also recently had ultrasound of her thyroid  completed for evaluation of enlarged thyroid  that was incidentally found on a CT scan of her chest.  This CT scan had been ordered to evaluate lung nodules.  Results of thyroid  ultrasound are still not available and this is causing patient's significant anxiety.  She also reports that someone told her her chart has mitral valve regurgitation listed as a problem.  She reports that she was never notified of this, per chart review it appears maybe it was identified by the [place] around September 2022 and only trace regurgitation was identified.  She is very scared about this "leaky valve" and wants to discuss this with her PCP in further detail.  She has no new/worsening exertional fatigue, shortness of breath, leg swelling, orthopnea, paroxysmal nocturnal dyspnea.    Review of Systems  Constitutional:  Positive for malaise/fatigue. Negative for chills and fever.  Respiratory:  Positive for cough, sputum production and wheezing. Negative for shortness of breath.       Objective:     BP (!) 140/80   Pulse 65   Temp 97.7 F (36.5 C) (Temporal)   Ht 5\' 4"  (1.626 m)   Wt 223 lb 8 oz (101.4 kg)   SpO2 97%   BMI 38.36 kg/m  BP Readings from Last 3 Encounters:  04/20/24 (!) 140/80  04/03/24  136/82  01/23/24 126/66   Wt Readings from Last 3 Encounters:  04/20/24 223 lb 8 oz (101.4 kg)  04/03/24 228 lb (103.4 kg)  01/23/24 222 lb (100.7 kg)      Physical Exam Vitals reviewed.  Constitutional:      General: She is not in acute distress.    Appearance: Normal appearance.  HENT:     Head: Normocephalic and atraumatic.  Neck:     Vascular: No carotid bruit.  Cardiovascular:     Rate and Rhythm: Normal rate and regular rhythm.     Pulses: Normal pulses.     Heart sounds: Normal heart sounds.  Pulmonary:     Effort: Pulmonary effort is normal.     Breath sounds: Wheezing present.  Skin:    General: Skin is warm and dry.  Neurological:     General: No focal deficit present.     Mental Status: She is alert and oriented to person, place, and time.  Psychiatric:        Mood and Affect: Mood normal.        Behavior: Behavior normal.        Judgment: Judgment normal.      Results for orders placed or performed in visit on 04/20/24  POC COVID-19 BinaxNow  Result Value Ref Range   SARS Coronavirus 2 Ag Negative Negative  POCT Influenza A/B  Result Value Ref Range   Influenza A, POC  Negative Negative   Influenza B, POC Negative Negative  POCT respiratory syncytial virus  Result Value Ref Range   RSV Rapid Ag negative       The ASCVD Risk score (Arnett DK, et al., 2019) failed to calculate for the following reasons:   Risk score cannot be calculated because patient has a medical history suggesting prior/existing ASCVD    Assessment & Plan:   Problem List Items Addressed This Visit       Other   Cough - Primary   Acute, vital signs stable, oxygen saturation 97% on room air. Point-of-care COVID, RSV, flu: negative Likely seasonal allergies causing symptoms.  Possible viral etiology as well.  For now we will treat with albuterol  inhaler, Mucinex  for expectorant, and hydrate well throughout the day. Call office if symptoms persist or worsen.       Relevant Medications   albuterol  (VENTOLIN  HFA) 108 (90 Base) MCG/ACT inhaler   Other Relevant Orders   POC COVID-19 BinaxNow (Completed)   POCT Influenza A/B (Completed)   POCT respiratory syncytial virus (Completed)   I did reach out to our administrator and asked that she call radiology to see if that ultrasound of the thyroid  can be read.  She reports that she will reach out to them.  Return if symptoms worsen or fail to improve.    Zorita Hiss, NP

## 2024-04-20 NOTE — Telephone Encounter (Signed)
 Copied from CRM [id number]. Topic: Clinical - Lab/Test Results >> Apr 20, 2024  8:35 AM Zipporah Him wrote: Patient states she is having a lot of anxiety due to her scan results not being relayed. She doesn't want to go all weekend without hearing an update from the office, regardless if they are ready or not. She just wants to speak with Dr Nicolette Barrio or Nurse. Requests a call back ASAP.  >> Apr 18, 2024  3:38 PM Zipporah Him wrote: Patient calling again about US  results, advised her they are not released and she will get a call when they are available.

## 2024-04-23 ENCOUNTER — Encounter: Payer: Self-pay | Admitting: Internal Medicine

## 2024-04-24 ENCOUNTER — Telehealth: Payer: Self-pay | Admitting: Internal Medicine

## 2024-04-24 DIAGNOSIS — H9113 Presbycusis, bilateral: Secondary | ICD-10-CM | POA: Diagnosis not present

## 2024-04-24 NOTE — Telephone Encounter (Signed)
 Copied from CRM [id number]. Topic: Clinical - Lab/Test Results >> Apr 24, 2024  2:02 PM Deaijah H wrote: Reason for CRM: Patient would like Dr. Nicolette Barrio nurse to give a call back regarding Lab results and sending something else in for chest/cough. Please call [phone]

## 2024-04-24 NOTE — Telephone Encounter (Signed)
 Please advise the chest /cough

## 2024-04-24 NOTE — Telephone Encounter (Signed)
 I don't know what she is asking about will need more details.

## 2024-04-25 ENCOUNTER — Other Ambulatory Visit: Payer: Self-pay | Admitting: Nurse Practitioner

## 2024-04-25 ENCOUNTER — Ambulatory Visit: Payer: Self-pay

## 2024-04-25 DIAGNOSIS — R059 Cough, unspecified: Secondary | ICD-10-CM

## 2024-04-25 DIAGNOSIS — J40 Bronchitis, not specified as acute or chronic: Secondary | ICD-10-CM

## 2024-04-25 MED ORDER — BENZONATATE 100 MG PO CAPS
100.0000 mg | ORAL_CAPSULE | Freq: Two times a day (BID) | ORAL | 0 refills | Status: DC | PRN
Start: 2024-04-25 — End: 2024-05-02

## 2024-04-25 NOTE — Telephone Encounter (Signed)
 Patient is asking for something else to be sent in please advise

## 2024-04-25 NOTE — Telephone Encounter (Signed)
  Chief Complaint: Cough Symptoms: "tight cough" per patient's description Frequency: going on since 04/20/2024 visit Pertinent Negatives: Patient denies CP, SOB Disposition: [] ED /[] Urgent Care (no appt availability in office) / [] Appointment(In office/virtual)/ []  [facility]/ [] Home Care/ [] Refused Recommended Disposition /[] [facility]/ [x]  Follow-up with PCP Additional Notes: patient was seen in the office on 04/20/2024 about a cough. Patient calling today to report continued cough. Patient endorses "tight cough." Patient is unable to give any more descriptive information other than "tight cough." Patient was given an inhaler on 04/20/2024 and states that the inhaler didn't work and "I'm not an inhaler person." Patient is requesting recommendations from PCP in regards to medications. Patient is asking to not have to come back into the office. Patient reports feeling like she is wheezing at times but denies SOB or CP. Patient is asking to be called with recommendations. [phone]. Patient verbalized understanding and all questions answered.    Copied from CRM [id number]. Topic: Clinical - Red Word Triage >> Apr 25, 2024  8:43 AM Alethia Huxley E wrote: [facility] that prompted transfer to Nurse Triage: Chest tightness. Patient has been experiencing some chest tightness and a cough for the past week. Patient got an inhaler on 4/25 that is not working. Reason for Disposition  [1] Continuous (nonstop) coughing interferes with work or school AND [2] no improvement using cough treatment per Care Advice  Answer Assessment - Initial Assessment Questions 1. ONSET: "When did the cough begin?"      Cough started prior to 04/20/2024 visit 2. SEVERITY: "How bad is the cough today?"      Patient unable to say how bad her cough is 3. SPUTUM: "Describe the color of your sputum" (none, dry cough; clear, white, yellow, green)     clear 4. HEMOPTYSIS: "Are you coughing up any blood?" If so ask:  "How much?" (flecks, streaks, tablespoons, etc.)     no 5. DIFFICULTY BREATHING: "Are you having difficulty breathing?" If Yes, ask: "How bad is it?" (e.g., mild, moderate, severe)    - MILD: No SOB at rest, mild SOB with walking, speaks normally in sentences, can lie down, no retractions, pulse < 100.    - MODERATE: SOB at rest, SOB with minimal exertion and prefers to sit, cannot lie down flat, speaks in phrases, mild retractions, audible wheezing, pulse 100-120.    - SEVERE: Very SOB at rest, speaks in single words, struggling to breathe, sitting hunched forward, retractions, pulse > 120      no 6. FEVER: "Do you have a fever?" If Yes, ask: "What is your temperature, how was it measured, and when did it start?"     no 7. CARDIAC HISTORY: "Do you have any history of heart disease?" (e.g., heart attack, congestive heart failure)      no 8. LUNG HISTORY: "Do you have any history of lung disease?"  (e.g., pulmonary embolus, asthma, emphysema)     no 9. PE RISK FACTORS: "Do you have a history of blood clots?" (or: recent major surgery, recent prolonged travel, bedridden)     no 10. OTHER SYMPTOMS: "Do you have any other symptoms?" (e.g., runny nose, wheezing, chest pain)       wheezing 12. TRAVEL: "Have you traveled out of the country in the last month?" (e.g., travel history, exposures)       no  Protocols used: Cough - Acute Productive-A-AH

## 2024-04-26 ENCOUNTER — Other Ambulatory Visit: Payer: Self-pay | Admitting: Nurse Practitioner

## 2024-04-26 DIAGNOSIS — J40 Bronchitis, not specified as acute or chronic: Secondary | ICD-10-CM

## 2024-04-26 MED ORDER — DOXYCYCLINE HYCLATE 100 MG PO TABS
100.0000 mg | ORAL_TABLET | Freq: Two times a day (BID) | ORAL | 0 refills | Status: DC
Start: 2024-04-26 — End: 2024-05-03

## 2024-04-26 MED ORDER — DOXYCYCLINE HYCLATE 100 MG PO CAPS
100.0000 mg | ORAL_CAPSULE | Freq: Two times a day (BID) | ORAL | 0 refills | Status: DC
Start: 2024-04-26 — End: 2024-04-26

## 2024-04-26 NOTE — Telephone Encounter (Signed)
**Note De-identified  [place] Obfuscation** Please advise 

## 2024-04-26 NOTE — Telephone Encounter (Signed)
 Called patient back and informed her in regards to this

## 2024-04-26 NOTE — Addendum Note (Signed)
 Addended by: Zorita Hiss on: 04/26/2024 03:22 PM   Modules accepted: Orders

## 2024-04-26 NOTE — Telephone Encounter (Signed)
 Please call patient and let her know that I sent in a prescription for an antibiotic to the [facility] that she has on file. I have sent doxycycline  100mg . She should take 1 capsule by mouth in the morning and then capsule by mouth in the evening for 10 days.

## 2024-04-26 NOTE — Telephone Encounter (Signed)
 Patient states that this will not work because it more so of a congestion that she has in her upper chest and she needs something that is going to help her break this down

## 2024-04-27 NOTE — Telephone Encounter (Signed)
 Pt has been advised.

## 2024-04-27 NOTE — Telephone Encounter (Signed)
 I recommend zyrtec  for allergies to help

## 2024-04-27 NOTE — Telephone Encounter (Signed)
 This has been addressed.

## 2024-05-01 ENCOUNTER — Ambulatory Visit: Payer: Self-pay

## 2024-05-01 DIAGNOSIS — J40 Bronchitis, not specified as acute or chronic: Secondary | ICD-10-CM

## 2024-05-01 NOTE — Telephone Encounter (Signed)
 Patient called and stated she never got medication. [facility] utilizes [facility] [address]. Patient also asking if the medication can be switched to Amoxicillin , she feels more comfortable taking this medication. [facility] [address]. She is also requesting benzonatate  to be sent in. Patient is asking for call back if Amoxicillin  is refused.  Copied from CRM [id number]. Topic: Clinical - Medication Question >> May 01, 2024 12:25 PM Marlan Silva wrote: Reason for CRM: Patient is calling in to request amoxicillin  instead of the doxycycline  that she had ordered. Medication can be sent to the pharmacy on file. Reason for Disposition  [1] Follow-up call from patient regarding patient's clinical status AND [2] information NON-URGENT  Answer Assessment - Initial Assessment Questions 1. REASON FOR CALL or QUESTION: "What is your reason for calling today?" or "How can I best help you?" or "What question do you have that I can help answer?"     Please see notes 2. CALLER: Document the source of call. (e.g., laboratory, patient).     Patient  Protocols used: PCP Call - No Triage-A-AH

## 2024-05-02 ENCOUNTER — Other Ambulatory Visit: Payer: Self-pay

## 2024-05-02 DIAGNOSIS — R059 Cough, unspecified: Secondary | ICD-10-CM

## 2024-05-02 MED ORDER — BENZONATATE 100 MG PO CAPS
100.0000 mg | ORAL_CAPSULE | Freq: Two times a day (BID) | ORAL | 0 refills | Status: DC | PRN
Start: 2024-05-02 — End: 2024-08-21

## 2024-05-02 NOTE — Telephone Encounter (Signed)
 Ok to refill tessalon  perles (benzontate) to her pharmacy #[id number] no refills. I would defer the medication choice to Isa Manuel since she saw the patient for this concern and would recommend she follow the plan from then.

## 2024-05-02 NOTE — Telephone Encounter (Signed)
 Route to gray?

## 2024-05-03 ENCOUNTER — Other Ambulatory Visit: Payer: Self-pay | Admitting: Nurse Practitioner

## 2024-05-03 DIAGNOSIS — J40 Bronchitis, not specified as acute or chronic: Secondary | ICD-10-CM

## 2024-05-03 MED ORDER — AMOXICILLIN-POT CLAVULANATE 875-125 MG PO TABS: 1.0000 | ORAL_TABLET | Freq: Two times a day (BID) | ORAL | 0 refills | Status: DC

## 2024-05-09 NOTE — Telephone Encounter (Signed)
 Pt has been advised.

## 2024-05-16 DIAGNOSIS — Z1231 Encounter for screening mammogram for malignant neoplasm of breast: Secondary | ICD-10-CM | POA: Diagnosis not present

## 2024-05-16 LAB — HM MAMMOGRAPHY

## 2024-05-31 DIAGNOSIS — H0288B Meibomian gland dysfunction left eye, upper and lower eyelids: Secondary | ICD-10-CM | POA: Diagnosis not present

## 2024-05-31 DIAGNOSIS — H11823 Conjunctivochalasis, bilateral: Secondary | ICD-10-CM | POA: Diagnosis not present

## 2024-05-31 DIAGNOSIS — H40052 Ocular hypertension, left eye: Secondary | ICD-10-CM | POA: Diagnosis not present

## 2024-05-31 DIAGNOSIS — H401111 Primary open-angle glaucoma, right eye, mild stage: Secondary | ICD-10-CM | POA: Diagnosis not present

## 2024-05-31 DIAGNOSIS — G43B0 Ophthalmoplegic migraine, not intractable: Secondary | ICD-10-CM | POA: Diagnosis not present

## 2024-05-31 DIAGNOSIS — H0288A Meibomian gland dysfunction right eye, upper and lower eyelids: Secondary | ICD-10-CM | POA: Diagnosis not present

## 2024-06-12 LAB — CBC AND DIFFERENTIAL
HCT: 42 (ref 36–46)
Hemoglobin: 14.3 (ref 12.0–16.0)
Platelets: 158 K/uL (ref 150–400)
WBC: 5.4

## 2024-06-12 LAB — CBC: RBC: 4.9 (ref 3.87–5.11)

## 2024-06-13 ENCOUNTER — Telehealth: Payer: Self-pay | Admitting: Internal Medicine

## 2024-06-13 NOTE — Telephone Encounter (Signed)
 Copied from CRM [id number]. Topic: Appointments - Scheduling Inquiry for Clinic >> Jun 13, 2024  3:05 PM Leah C wrote: Reason for CRM: Patient is calling in regards to her nephew and would like to have him set up as a new patient for Dr.Crawford, and would like to know how far she is scheduled out for new patients. She and a few family members within the household are existing patients of Dr.Crawford. Patients contact is [phone].

## 2024-06-14 NOTE — Telephone Encounter (Signed)
 Provider is no longer accepting New patient appointments

## 2024-06-21 ENCOUNTER — Telehealth: Payer: Self-pay | Admitting: Gastroenterology

## 2024-06-21 NOTE — Telephone Encounter (Signed)
 Good Morning Dr Tex   Supervising MD  Patient Is requesting a chart review to obtain a second opinion in regards to her gastro care.   Has previous work up . Records are available for review in epic. Please review and advise.   Thank you

## 2024-06-22 ENCOUNTER — Ambulatory Visit: Payer: Self-pay

## 2024-06-22 ENCOUNTER — Other Ambulatory Visit: Payer: Self-pay | Admitting: Internal Medicine

## 2024-06-22 ENCOUNTER — Telehealth: Payer: Self-pay | Admitting: Internal Medicine

## 2024-06-22 NOTE — Telephone Encounter (Unsigned)
 Copied from CRM [id number]. Topic: Clinical - Medication Refill >> Jun 22, 2024  2:46 PM Viola F wrote: Medication: sucralfate  (CARAFATE ) 1 GM/10ML suspension   Has the patient contacted their pharmacy? Yes (Agent: If no, request that the patient contact the pharmacy for the refill. If patient does not wish to contact the pharmacy document the reason why and proceed with request.) (Agent: If yes, when and what did the pharmacy advise?)  This is the patient's preferred pharmacy:  [id number] [place], [place] - [address] AT [facility] [address] [place] [address] Phone: [phone] Fax: [phone]  Is this the correct pharmacy for this prescription? Yes If no, delete pharmacy and type the correct one.   Has the prescription been filled recently? Yes  Is the patient out of the medication? Yes  Has the patient been seen for an appointment in the last year OR does the patient have an upcoming appointment? Yes  Can we respond through MyChart? Yes  Agent: Please be advised that Rx refills may take up to 3 business days. We ask that you follow-up with your pharmacy.

## 2024-06-22 NOTE — Telephone Encounter (Signed)
 FYI Only or Action Required?: Action required by provider: medication refill request.  Patient was last seen in primary care on 04/20/2024 by Gabrielle Lauraine BRAVO, NP. Called Nurse Triage reporting Dizziness. Symptoms began a week ago. Interventions attempted: Rest, hydration, or home remedies. Symptoms are: unchanged.  Triage Disposition: See Physician Within 24 Hours  Patient/caregiver understands and will follow disposition?: Yes  ** Patient offered an appointment but only wants to see Dr. Rollene; appt for 7/11 scheduled. Also patient states the pharmacy does not have her Carafate  prescription, and she is there now needing it resent.**                       Copied from CRM [id number]. Topic: Clinical - Red Word Triage >> Jun 22, 2024  2:48 PM Viola F wrote: Red Word that prompted transfer to Nurse Triage: Patient has been feeling dizziness/woozy, requested to an appointment Reason for Disposition  [1] MODERATE dizziness (e.g., interferes with normal activities) AND [2] has NOT been evaluated by doctor (or NP/PA) for this  (Exception: Dizziness caused by heat exposure, sudden standing, or poor fluid intake.)  Answer Assessment - Initial Assessment Questions 1. DESCRIPTION: Describe your dizziness.     She states when getting up she feels like laying back down. Occurs with movement   2. LIGHTHEADED: Do you feel lightheaded? (e.g., somewhat faint, woozy, weak upon standing)    Lightheaded   3. VERTIGO: Do you feel like either you or the room is spinning or tilting? (i.e. vertigo)     No  4. SEVERITY: How bad is it?  Do you feel like you are going to faint? Can you stand and walk?   - MILD: Feels slightly dizzy, but walking normally.   - MODERATE: Feels unsteady when walking, but not falling; interferes with normal activities (e.g., school, work).   - SEVERE: Unable to walk without falling, or requires assistance to walk without falling; feels like passing out  now.     Moderate  5. ONSET:  When did the dizziness begin?     X 1 week   6. AGGRAVATING FACTORS: Does anything make it worse? (e.g., standing, change in head position)     Standing   8. CAUSE: What do you think is causing the dizziness?     Unknown   9. RECURRENT SYMPTOM: Have you had dizziness before? If Yes, ask: When was the last time? What happened that time?   She states its been a while   10. OTHER SYMPTOMS: Do you have any other symptoms? (e.g., fever, chest pain, vomiting, diarrhea, bleeding)     No    When laying symptoms lessen. Symptoms are not current.  Protocols used: Dizziness - Lightheadedness-A-AH

## 2024-06-23 ENCOUNTER — Encounter (HOSPITAL_COMMUNITY): Payer: Self-pay | Admitting: Interventional Radiology

## 2024-06-25 NOTE — Telephone Encounter (Signed)
 This medication was sent  in same day

## 2024-06-26 DIAGNOSIS — R8281 Pyuria: Secondary | ICD-10-CM | POA: Diagnosis not present

## 2024-06-26 DIAGNOSIS — E669 Obesity, unspecified: Secondary | ICD-10-CM | POA: Diagnosis not present

## 2024-06-26 DIAGNOSIS — R918 Other nonspecific abnormal finding of lung field: Secondary | ICD-10-CM | POA: Diagnosis not present

## 2024-06-26 DIAGNOSIS — H9113 Presbycusis, bilateral: Secondary | ICD-10-CM | POA: Diagnosis not present

## 2024-06-26 DIAGNOSIS — H9319 Tinnitus, unspecified ear: Secondary | ICD-10-CM | POA: Diagnosis not present

## 2024-06-26 DIAGNOSIS — I10 Essential (primary) hypertension: Secondary | ICD-10-CM | POA: Diagnosis not present

## 2024-07-05 NOTE — Telephone Encounter (Signed)
 Called patient , unable to reach will try efforts at a later time.

## 2024-07-06 ENCOUNTER — Ambulatory Visit (INDEPENDENT_AMBULATORY_CARE_PROVIDER_SITE_OTHER): Admitting: Internal Medicine

## 2024-07-06 ENCOUNTER — Encounter: Payer: Self-pay | Admitting: Internal Medicine

## 2024-07-06 VITALS — BP 136/82 | HR 66 | Temp 98.0°F | Ht 64.0 in | Wt 222.0 lb

## 2024-07-06 DIAGNOSIS — R42 Dizziness and giddiness: Secondary | ICD-10-CM | POA: Insufficient documentation

## 2024-07-06 DIAGNOSIS — Z6838 Body mass index (BMI) 38.0-38.9, adult: Secondary | ICD-10-CM | POA: Diagnosis not present

## 2024-07-06 NOTE — Assessment & Plan Note (Signed)
 She is working on exercise and diet to help. BMI 38 complicated by glaucoma and GERD.

## 2024-07-06 NOTE — Progress Notes (Signed)
   Subjective:   Patient ID: Gabrielle [name], female    DOB: 06-22-1951, 73 y.o.   MRN: [id number]  HPI The patient is a 73 YO female coming in for dizziness which is off and on random a few episodes in the last few months. Not lightheaded but unsteady. Went to ENT within 2 days of an episode and ears looked normal.   Review of Systems  Constitutional: Negative.   HENT: Negative.    Eyes: Negative.   Respiratory:  Negative for cough, chest tightness and shortness of breath.   Cardiovascular:  Negative for chest pain, palpitations and leg swelling.  Gastrointestinal:  Negative for abdominal distention, abdominal pain, constipation, diarrhea, nausea and vomiting.  Musculoskeletal: Negative.   Skin: Negative.   Neurological: Negative.   Psychiatric/Behavioral: Negative.      Objective:  Physical Exam Constitutional:      Appearance: She is well-developed.  HENT:     Head: Normocephalic and atraumatic.  Cardiovascular:     Rate and Rhythm: Normal rate and regular rhythm.  Pulmonary:     Effort: Pulmonary effort is normal. No respiratory distress.     Breath sounds: Normal breath sounds. No wheezing or rales.  Abdominal:     General: Bowel sounds are normal. There is no distension.     Palpations: Abdomen is soft.     Tenderness: There is no abdominal tenderness. There is no rebound.  Musculoskeletal:     Cervical back: Normal range of motion.  Skin:    General: Skin is warm and dry.  Neurological:     Mental Status: She is alert and oriented to person, place, and time.     Coordination: Coordination normal.     Vitals:   07/06/24 1034  BP: 136/82  Pulse: 66  Temp: 98 F (36.7 C)  TempSrc: Oral  SpO2: 98%  Weight: 222 lb (100.7 kg)  Height: 5' 4 (1.626 m)    Assessment & Plan:

## 2024-07-06 NOTE — Patient Instructions (Signed)
We will not change the medicines today.  

## 2024-07-06 NOTE — Assessment & Plan Note (Signed)
 New recently and unclear cause. She did not have fluid in her ear right around the time of episode and they are normal today. BP is normal today.

## 2024-07-13 ENCOUNTER — Encounter: Payer: Self-pay | Admitting: Internal Medicine

## 2024-07-17 DIAGNOSIS — H11823 Conjunctivochalasis, bilateral: Secondary | ICD-10-CM | POA: Diagnosis not present

## 2024-07-17 DIAGNOSIS — Z961 Presence of intraocular lens: Secondary | ICD-10-CM | POA: Diagnosis not present

## 2024-07-17 DIAGNOSIS — H02822 Cysts of right lower eyelid: Secondary | ICD-10-CM | POA: Diagnosis not present

## 2024-07-17 DIAGNOSIS — H47012 Ischemic optic neuropathy, left eye: Secondary | ICD-10-CM | POA: Diagnosis not present

## 2024-07-17 DIAGNOSIS — H401111 Primary open-angle glaucoma, right eye, mild stage: Secondary | ICD-10-CM | POA: Diagnosis not present

## 2024-07-17 DIAGNOSIS — D3132 Benign neoplasm of left choroid: Secondary | ICD-10-CM | POA: Diagnosis not present

## 2024-07-17 DIAGNOSIS — H40052 Ocular hypertension, left eye: Secondary | ICD-10-CM | POA: Diagnosis not present

## 2024-07-24 DIAGNOSIS — H9313 Tinnitus, bilateral: Secondary | ICD-10-CM | POA: Diagnosis not present

## 2024-07-24 DIAGNOSIS — H9113 Presbycusis, bilateral: Secondary | ICD-10-CM | POA: Diagnosis not present

## 2024-07-24 DIAGNOSIS — H903 Sensorineural hearing loss, bilateral: Secondary | ICD-10-CM | POA: Diagnosis not present

## 2024-07-31 ENCOUNTER — Encounter: Payer: Self-pay | Admitting: Gastroenterology

## 2024-07-31 DIAGNOSIS — H02822 Cysts of right lower eyelid: Secondary | ICD-10-CM | POA: Diagnosis not present

## 2024-07-31 DIAGNOSIS — H16212 Exposure keratoconjunctivitis, left eye: Secondary | ICD-10-CM | POA: Diagnosis not present

## 2024-07-31 DIAGNOSIS — H401111 Primary open-angle glaucoma, right eye, mild stage: Secondary | ICD-10-CM | POA: Diagnosis not present

## 2024-07-31 DIAGNOSIS — H47012 Ischemic optic neuropathy, left eye: Secondary | ICD-10-CM | POA: Diagnosis not present

## 2024-07-31 DIAGNOSIS — Z961 Presence of intraocular lens: Secondary | ICD-10-CM | POA: Diagnosis not present

## 2024-08-13 ENCOUNTER — Other Ambulatory Visit: Payer: Self-pay | Admitting: Internal Medicine

## 2024-08-13 DIAGNOSIS — F4312 Post-traumatic stress disorder, chronic: Secondary | ICD-10-CM

## 2024-08-13 NOTE — Telephone Encounter (Unsigned)
 Copied from CRM #[id number]. Topic: Clinical - Medication Refill >> Aug 13, 2024  4:44 PM Roselie C wrote: Medication: ALPRAZolam  (XANAX ) 1 MG tablet,  zolpidem  (AMBIEN ) 10 MG tablet  Pharmacy said these medications have expired and needs new ones when its time to fill, patient asks to give her a call once new scripts are sent in , patient states she is blind and can't get notices on my chart or thru the pharmacy  Has the patient contacted their pharmacy? Yes (Agent: If no, request that the patient contact the pharmacy for the refill. If patient does not wish to contact the pharmacy document the reason why and proceed with request.) (Agent: If yes, when and what did the pharmacy advise?)  This is the patient's preferred pharmacy:  [id number] - [place], [place] - [address] AT [facility] [address] [place] [address] Phone: [phone] Fax: [phone]  Is this the correct pharmacy for this prescription? Yes If no, delete pharmacy and type the correct one.   Has the prescription been filled recently? Yes  Is the patient out of the medication? Yes  Has the patient been seen for an appointment in the last year OR does the patient have an upcoming appointment? Yes  Can we respond through MyChart? No  Agent: Please be advised that Rx refills may take up to 3 business days. We ask that you follow-up with your pharmacy.

## 2024-08-15 MED ORDER — ALPRAZOLAM 1 MG PO TABS: 1.0000 mg | ORAL_TABLET | Freq: Three times a day (TID) | ORAL | 5 refills | Status: AC

## 2024-08-15 NOTE — Telephone Encounter (Signed)
 Copied from CRM #[id number]. Topic: Clinical - Prescription Issue >> Aug 15, 2024  9:52 AM Rea BROCKS wrote: Reason for CRM: Patient is calling back in regards to refill request for ALPRAZolam  (XANAX ) 1 MG tablet,  zolpidem  (AMBIEN ) 10 MG tablet.   [facility]. I see the refill request was routed to Dr. Rollene this morning and I advised patient of that. She stated that she will call back tomorrow morning.

## 2024-08-19 ENCOUNTER — Other Ambulatory Visit: Payer: Self-pay | Admitting: Internal Medicine

## 2024-08-19 DIAGNOSIS — F5104 Psychophysiologic insomnia: Secondary | ICD-10-CM

## 2024-08-20 ENCOUNTER — Ambulatory Visit: Admitting: Internal Medicine

## 2024-08-20 ENCOUNTER — Encounter: Payer: Self-pay | Admitting: Internal Medicine

## 2024-08-20 VITALS — BP 118/82 | HR 94 | Temp 97.9°F | Ht 64.0 in | Wt 220.6 lb

## 2024-08-20 DIAGNOSIS — R918 Other nonspecific abnormal finding of lung field: Secondary | ICD-10-CM | POA: Diagnosis not present

## 2024-08-20 DIAGNOSIS — R6889 Other general symptoms and signs: Secondary | ICD-10-CM

## 2024-08-20 DIAGNOSIS — F5104 Psychophysiologic insomnia: Secondary | ICD-10-CM

## 2024-08-20 DIAGNOSIS — E041 Nontoxic single thyroid nodule: Secondary | ICD-10-CM

## 2024-08-20 DIAGNOSIS — J029 Acute pharyngitis, unspecified: Secondary | ICD-10-CM | POA: Diagnosis not present

## 2024-08-20 DIAGNOSIS — R5383 Other fatigue: Secondary | ICD-10-CM

## 2024-08-20 LAB — POC COVID19 BINAXNOW: SARS Coronavirus 2 Ag: NEGATIVE

## 2024-08-20 LAB — POCT INFLUENZA A/B
Influenza A, POC: NEGATIVE
Influenza B, POC: NEGATIVE

## 2024-08-20 MED ORDER — ZOLPIDEM TARTRATE 10 MG PO TABS: 10.0000 mg | ORAL_TABLET | Freq: Every evening | ORAL | 5 refills | Status: DC | PRN

## 2024-08-20 NOTE — Telephone Encounter (Unsigned)
 Copied from CRM #[id number]. Topic: Clinical - Prescription Issue >> Aug 15, 2024  9:52 AM Rea BROCKS wrote: Reason for CRM: Patient is calling back in regards to refill request for ALPRAZolam  (XANAX ) 1 MG tablet,  zolpidem  (AMBIEN ) 10 MG tablet.   [facility] told her that the script was expired. I see the refill request was routed to Dr. Rollene this morning and I advised patient of that. She stated that she will call back tomorrow morning. >> Aug 20, 2024 10:21 AM Essie A wrote: Patient called again today regarding zolpidem  (AMBIEN ) 10 MG tablet.  She is upset because she has not been able to get this medication.  She has an appt today at 3 pm.  She can discuss this with the doctor.

## 2024-08-20 NOTE — Progress Notes (Unsigned)
 Subjective:   Patient ID: Gabrielle [name], female    DOB: 12-22-1951, 73 y.o.   MRN: [id number]  Discussed the use of AI scribe software for clinical note transcription with the patient, who gave verbal consent to proceed. History of Present Illness Gabrielle [name] is a 73 year old female who presents with fatigue, sore throat, and ear discomfort.  She has been experiencing fatigue and a general feeling of malaise for the past three to four days. This morning, she developed a sore throat with discomfort radiating to her ears. She describes hearing two significant pops in her ears yesterday, which is unusual for her. She has a history of being concerned about ear pressure changes during flights, but she managed well during her recent travel out of the country.  She reports no fever. She has experienced mild headaches, for which she took Tylenol  yesterday and today, primarily for ear pain. No new cough, breathing problems, or significant vision changes, although she notes her vision has not been the same since returning from her trip, possibly due to frequent rides on golf carts with a lot of airflow.  She has not taken any new medications except for Tylenol . She recalls being prescribed Augmentin  in the past for a different issue but never started it as she began feeling better. She still has the medication at home, unopened.  She inquires about follow-up for nodules found in her thyroid  and lung. She recalls being told to follow up in a year for the thyroid  and two years for the lung, with the thyroid  follow-up due next April.  Review of Systems  Constitutional:  Positive for activity change, appetite change and fatigue. Negative for chills, fever and unexpected weight change.  HENT:  Positive for congestion, postnasal drip, rhinorrhea, sinus pressure and sore throat. Negative for ear discharge, ear pain, sinus pain, sneezing, tinnitus, trouble swallowing and voice change.   Eyes: Negative.    Respiratory:  Negative for cough, chest tightness, shortness of breath and wheezing.   Cardiovascular: Negative.   Gastrointestinal: Negative.   Musculoskeletal:  Positive for myalgias.  Neurological: Negative.     Objective:  Physical Exam Constitutional:      Appearance: She is well-developed.  HENT:     Head: Normocephalic and atraumatic.     Comments: Oropharynx with redness and clear drainage, nose with swollen turbinates, TMs normal bilaterally.  Neck:     Thyroid : No thyromegaly.  Cardiovascular:     Rate and Rhythm: Normal rate and regular rhythm.  Pulmonary:     Effort: Pulmonary effort is normal. No respiratory distress.     Breath sounds: Normal breath sounds. No wheezing or rales.  Abdominal:     General: Bowel sounds are normal. There is no distension.     Palpations: Abdomen is soft.     Tenderness: There is no abdominal tenderness. There is no rebound.  Musculoskeletal:        General: Tenderness present.     Cervical back: Normal range of motion.  Lymphadenopathy:     Cervical: No cervical adenopathy.  Skin:    General: Skin is warm and dry.  Neurological:     Mental Status: She is alert and oriented to person, place, and time.     Coordination: Coordination normal.     Vitals:   08/20/24 1532  BP: 118/82  Pulse: 94  Temp: 97.9 F (36.6 C)  TempSrc: Oral  SpO2: 98%  Weight: 220 lb 9.6 oz (100.1 kg)  Height: 5' 4 (1.626 m)    Assessment and Plan Assessment & Plan Upper respiratory symptoms (sore throat, ear pain, fatigue)   She experiences recent sore throat, ear pain, and fatigue without fever or cough. Ears show bulgy fluid and throat has mild redness. Differential diagnosis includes mild COVID-19 or flu. Perform COVID-19 and flu tests.  Thyroid  nodule, under surveillance   The thyroid  nodule remains under surveillance with a follow-up ultrasound scheduled. Schedule the thyroid  ultrasound for April 2026.  Pulmonary nodule, under  surveillance   The pulmonary nodule is under surveillance with a follow-up CT scan scheduled. Schedule the follow-up CT scan two years from the last scan.

## 2024-08-20 NOTE — Telephone Encounter (Signed)
 Copied from CRM [id number]. Topic: Clinical - Prescription Issue >> Aug 20, 2024  8:48 AM Precious C wrote: Reason for CRM: Patient called to check on the status of her zolpidem  (Ambien ) prescription. She stated that she originally requested the medication on August 18. On August 20, she followed up and was told by the pharmacy that the prescription had expired. As of August 24, the pharmacy reportedly sent another request and reached out to CAL. I was advised to send a note to the provider regarding this issue. >> Aug 20, 2024 10:23 AM Armenia J wrote: Patient would like for Dr. Rogena medication assistant to call her back in regards to medication. The patient is not interested in speaking with [facility] since we are not in clinic.   Patient is aware of the same day turn around time.

## 2024-08-21 ENCOUNTER — Ambulatory Visit: Admitting: Internal Medicine

## 2024-08-21 DIAGNOSIS — H9113 Presbycusis, bilateral: Secondary | ICD-10-CM | POA: Diagnosis not present

## 2024-08-21 DIAGNOSIS — J029 Acute pharyngitis, unspecified: Secondary | ICD-10-CM | POA: Insufficient documentation

## 2024-08-21 NOTE — Assessment & Plan Note (Signed)
 Follow up due April 2026 and will order closer to time. Reviewed imaging and follow up interval with her today. With next labs can check thyroid  function. No change in symptoms.

## 2024-08-21 NOTE — Assessment & Plan Note (Signed)
 POC covid-19 and flu done in office and negative. Suspect viral etiology. She still has a course of augmentin  at home she did not take earlier this year. Advised if symptoms not improving or worsening at 1 week to start this. She has had about 3 days of symptoms currently.

## 2024-08-21 NOTE — Assessment & Plan Note (Signed)
 Due for follow up April 2027 which is 2 years from most recent imaging. We discussed these were initially found Sept 2024 and followed up without change April 2025 and then will be due again 2027. Will order closer to time.

## 2024-08-21 NOTE — Assessment & Plan Note (Signed)
 Refilled ambien  10 mg at bedtime which she is using for sleep. Reviewed PDMP and appropriate.

## 2024-08-31 DIAGNOSIS — H9201 Otalgia, right ear: Secondary | ICD-10-CM | POA: Diagnosis not present

## 2024-08-31 DIAGNOSIS — H9313 Tinnitus, bilateral: Secondary | ICD-10-CM | POA: Diagnosis not present

## 2024-09-16 ENCOUNTER — Other Ambulatory Visit: Payer: Self-pay | Admitting: Internal Medicine

## 2024-09-19 DIAGNOSIS — Z0110 Encounter for examination of ears and hearing without abnormal findings: Secondary | ICD-10-CM | POA: Diagnosis not present

## 2024-09-19 DIAGNOSIS — M1712 Unilateral primary osteoarthritis, left knee: Secondary | ICD-10-CM | POA: Diagnosis not present

## 2024-09-21 ENCOUNTER — Other Ambulatory Visit: Payer: Self-pay | Admitting: Internal Medicine

## 2024-09-21 DIAGNOSIS — F5104 Psychophysiologic insomnia: Secondary | ICD-10-CM

## 2024-09-21 NOTE — Telephone Encounter (Signed)
 Copied from CRM [id number]. Topic: Clinical - Prescription Issue >> Sep 21, 2024  4:45 PM Rea ORN wrote: Reason for CRM: Pt called in panic. She stated her bag with Zolpidem  was lost on airplane and she needs PCP to send over enough to [facility] [address], [place], [place] [address]. Pt stated clinic can speak to [facility] at the pharmacy regarding this.   Pt is asking that this medication be sent asap.

## 2024-09-24 MED ORDER — ZOLPIDEM TARTRATE 10 MG PO TABS: 10.0000 mg | ORAL_TABLET | Freq: Every evening | ORAL | 0 refills | Status: AC | PRN

## 2024-09-24 NOTE — Telephone Encounter (Signed)
 Sent in thanks for adding pharmacy

## 2024-09-24 NOTE — Telephone Encounter (Signed)
 Duplicate

## 2024-09-24 NOTE — Addendum Note (Signed)
 Addended by: ROLLENE ALMARIE LABOR on: 09/24/2024 04:35 PM   Modules accepted: Orders

## 2024-09-25 ENCOUNTER — Ambulatory Visit: Payer: Self-pay

## 2024-09-25 NOTE — Telephone Encounter (Signed)
 FYI Only or Action Required?: Action required by provider: clinical question for provider.  Patient was last seen in primary care on 08/20/2024 by Gabrielle Almarie LABOR, Gabrielle [name].  Called Nurse Triage reporting Medication Problem.  Symptoms began today.  Interventions attempted: Nothing.  Symptoms are: stable.  Triage Disposition: Call PCP When Office is Open  Patient/caregiver understands and will follow disposition?: Yes     Copied from CRM #[id number]. Topic: Clinical - Red Word Triage >> Sep 25, 2024  2:23 PM Gabrielle [name] wrote: Red Word that prompted transfer to Nurse Triage: Patient lost her bag over the weekend that had her Ambien  in it, she is worried about withdrawal symptoms and is asking to speak to RN Reason for Disposition  [1] Caller has NON-URGENT medicine question about med that PCP prescribed AND [2] triager unable to answer question  Answer Assessment - Initial Assessment Questions 1. NAME of MEDICINE: What medicine(s) are you calling about?     Zolpidem  2. QUESTION: What is your question? (e.g., double dose of medicine, side effect)     Pt went 3-4 days without the medication, only using the xanx. Wants to know if she can stop the zolpidem , if so how to do so and what the possible side effects might be. She'd like something not addictive if she needs help sleeping but felt like she slept well without it.  3. PRESCRIBER: Who prescribed the medicine? Reason: if prescribed by specialist, call should be referred to that group. Dr. Rollene  Protocols used: Medication Question Call-A-AH

## 2024-09-26 ENCOUNTER — Ambulatory Visit: Admitting: Gastroenterology

## 2024-09-26 DIAGNOSIS — M1712 Unilateral primary osteoarthritis, left knee: Secondary | ICD-10-CM | POA: Diagnosis not present

## 2024-09-26 NOTE — Telephone Encounter (Signed)
 Patient Ambien  was sent in already by the provider on Monday

## 2024-10-01 ENCOUNTER — Ambulatory Visit

## 2024-10-01 DIAGNOSIS — Z23 Encounter for immunization: Secondary | ICD-10-CM | POA: Diagnosis not present

## 2024-10-01 NOTE — Progress Notes (Signed)
Pt received HD flu w/o complications

## 2024-10-03 DIAGNOSIS — M1712 Unilateral primary osteoarthritis, left knee: Secondary | ICD-10-CM | POA: Diagnosis not present

## 2024-10-16 DIAGNOSIS — H9113 Presbycusis, bilateral: Secondary | ICD-10-CM | POA: Diagnosis not present

## 2024-10-26 ENCOUNTER — Ambulatory Visit

## 2024-10-29 ENCOUNTER — Telehealth: Payer: Self-pay | Admitting: Internal Medicine

## 2024-10-29 ENCOUNTER — Telehealth: Payer: Self-pay

## 2024-10-29 NOTE — Telephone Encounter (Signed)
 Copied from CRM [id number]. Topic: Clinical - Prescription Issue >> Oct 29, 2024  2:08 PM Anairis L wrote: Reason for RMF:Ejupzwu has a question regarding  esomeprazole  (NEXIUM ) 40 MG capsule.

## 2024-10-30 ENCOUNTER — Other Ambulatory Visit: Payer: Self-pay | Admitting: Internal Medicine

## 2024-10-30 DIAGNOSIS — F4312 Post-traumatic stress disorder, chronic: Secondary | ICD-10-CM

## 2024-10-30 DIAGNOSIS — F5104 Psychophysiologic insomnia: Secondary | ICD-10-CM

## 2024-10-30 NOTE — Telephone Encounter (Signed)
 Pt called about Esomeprazole  which has been resolved. Pt stated she is feeling off when she gets up in the morning. Recently she has been taking a natural supplement of magnesium  with Ca, D3, and K2. She is taking Esomeprazole  MAG 40mg  BID and wanted to know if it could be due to having too much magnesium  in her blood? Pt wanted to know if she should d/c the supplement since the Esomeprazole  already has magnesium . Please adivse.

## 2024-10-30 NOTE — Telephone Encounter (Signed)
 Copied from CRM #[id number]. Topic: Clinical - Medication Refill >> Oct 30, 2024  8:18 AM Brittany M wrote: Medication: zolpidem  (AMBIEN ) 10 MG tablet ALPRAZolam  (XANAX ) 1 MG tablet  Has the patient contacted their pharmacy? Yes (Agent: If no, request that the patient contact the pharmacy for the refill. If patient does not wish to contact the pharmacy document the reason why and proceed with request.) (Agent: If yes, when and what did the pharmacy advise?)  This is the patient's preferred pharmacy:  [id number] [place], [place] - [address] AT [facility] [address] [place] [address] Phone: [phone] Fax: [phone]   Is this the correct pharmacy for this prescription? Yes If no, delete pharmacy and type the correct one.   Has the prescription been filled recently? Yes  Is the patient out of the medication? Yes  Has the patient been seen for an appointment in the last year OR does the patient have an upcoming appointment? Yes  Can we respond through MyChart? Yes  Agent: Please be advised that Rx refills may take up to 3 business days. We ask that you follow-up with your pharmacy.

## 2024-10-30 NOTE — Telephone Encounter (Signed)
 It is okay to keep taking supplement the kidneys filter out any extra so she should not have to worry about that.

## 2024-10-31 NOTE — Telephone Encounter (Signed)
 Called pt and relayed MD response, pt states she will still check w Dr. Rollene at her appt on Monday.

## 2024-11-03 ENCOUNTER — Emergency Department (HOSPITAL_BASED_OUTPATIENT_CLINIC_OR_DEPARTMENT_OTHER)
Admission: EM | Admit: 2024-11-03 | Discharge: 2024-11-03 | Disposition: A | Discharge status: Home / Self Care | Attending: Emergency Medicine | Admitting: Emergency Medicine

## 2024-11-03 ENCOUNTER — Other Ambulatory Visit: Payer: Self-pay

## 2024-11-03 DIAGNOSIS — H81399 Other peripheral vertigo, unspecified ear: Secondary | ICD-10-CM | POA: Diagnosis not present

## 2024-11-03 DIAGNOSIS — Z87891 Personal history of nicotine dependence: Secondary | ICD-10-CM | POA: Insufficient documentation

## 2024-11-03 DIAGNOSIS — Z8673 Personal history of transient ischemic attack (TIA), and cerebral infarction without residual deficits: Secondary | ICD-10-CM | POA: Diagnosis not present

## 2024-11-03 DIAGNOSIS — I10 Essential (primary) hypertension: Secondary | ICD-10-CM | POA: Diagnosis not present

## 2024-11-03 DIAGNOSIS — Z96651 Presence of right artificial knee joint: Secondary | ICD-10-CM | POA: Diagnosis not present

## 2024-11-03 DIAGNOSIS — R42 Dizziness and giddiness: Secondary | ICD-10-CM | POA: Diagnosis present

## 2024-11-03 LAB — BASIC METABOLIC PANEL WITH GFR
Anion gap: 9 (ref 5–15)
BUN: 16 mg/dL (ref 8–23)
CO2: 26 mmol/L (ref 22–32)
Calcium: 9.1 mg/dL (ref 8.9–10.3)
Chloride: 106 mmol/L (ref 98–111)
Creatinine, Ser: 0.93 mg/dL (ref 0.44–1.00)
GFR, Estimated: >60 mL/min (ref >60)
Glucose, Bld: 94 mg/dL (ref 70–99)
Potassium: 4.1 mmol/L (ref 3.5–5.1)
Sodium: 142 mmol/L (ref 135–145)

## 2024-11-03 LAB — CBC
HCT: 38.9 % (ref 36.0–46.0)
Hemoglobin: 12.7 g/dL (ref 12.0–15.0)
MCH: 28.4 pg (ref 26.0–34.0)
MCHC: 32.6 g/dL (ref 30.0–36.0)
MCV: 87 fL (ref 80.0–100.0)
Platelets: 103 K/uL — ABNORMAL LOW (ref 150–400)
RBC: 4.47 MIL/uL (ref 3.87–5.11)
RDW: 14.2 % (ref 11.5–15.5)
WBC: 4.4 K/uL (ref 4.0–10.5)
nRBC: 0 % (ref 0.0–0.2)

## 2024-11-03 MED ORDER — SODIUM CHLORIDE 0.9 % IV BOLUS
1000.0000 mL | Freq: Once | INTRAVENOUS | Status: AC
  Administered 2024-11-03: 1000 mL via INTRAVENOUS

## 2024-11-03 MED ORDER — MECLIZINE HCL 25 MG PO TABS: 25.0000 mg | ORAL_TABLET | Freq: Two times a day (BID) | ORAL | 0 refills | Status: AC | PRN

## 2024-11-03 MED ORDER — MECLIZINE HCL 25 MG PO TABS
25.0000 mg | ORAL_TABLET | Freq: Once | ORAL | Status: AC
  Administered 2024-11-03: 25 mg via ORAL
  Filled 2024-11-03: qty 1

## 2024-11-03 NOTE — Discharge Instructions (Addendum)
 It was a pleasure caring for you today in the emergency department.  Please follow-up with your ENT specialist regarding dizziness.  Be sure to move carefully, especially if you are feeling dizzy to avoid falling  Please return to the emergency department for any worsening or worrisome symptoms.

## 2024-11-03 NOTE — ED Provider Notes (Addendum)
 [place] EMERGENCY DEPARTMENT AT [facility] Provider Note  CSN: [id number] Arrival date & time: 11/03/24 9351  Chief Complaint(s) Dizziness  HPI Gabrielle [name] is a 73 y.o. female with past medical history as below, significant for anxiety, intermittent dizziness, glaucoma, hypertension, chronic constipation who presents to the ED with complaint of dizziness  Patient with intermittent dizziness over the past few years, she also has chronic tinnitus, follows with ENT.  Patient reports that she began experiencing sudden onset spinning sensation this morning.  Worsened with head turning or attempting to move.  No associated vision changes, she has some nausea no vomiting.  No headache, no weakness or numbness to extremities. Provoked by movement. No recent falls or head injuries.  Past Medical History Past Medical History:  Diagnosis Date   Anemia    as a child   Anxiety    takes Xanax  daily   Arthritis    Cataracts, bilateral    immature   Chronic insomnia 10/11/2014   Complication of anesthesia    Constipation    will occasionally take Milk of Mag   Diverticulosis    Dizziness    rarely   GERD (gastroesophageal reflux disease)    takes Omeprazole  every other day   Glaucoma    borderline and no drops required   Hearing loss    but doesn't have hearing aids   History of bronchitis    many many yrs ago   History of colitis    History of colon polyps    Hypertension    hasn't been on meds for the past 16yrs    Insomnia    takes Trazodone  nightly as needed and Ambien  nightly    Insomnia due to substance 10/11/2014   Joint pain    Joint swelling    PONV (postoperative nausea and vomiting)    Stroke (HCC)    Tinnitus    takes HCTZ daily to decrease pressure in ears   Patient Active Problem List   Diagnosis Date Noted   Sore throat 08/21/2024   Dizziness 07/06/2024   Cough 04/20/2024   Thyroid  nodule 04/05/2024   Eye pressure 01/26/2024   Lung nodule,  multiple 10/06/2023   Abnormal electrocardiogram (ECG) (EKG) 08/31/2022   Adjustment disorder with depressed mood 08/31/2022   Bunion of great toe 08/31/2022   Dysphagia 08/31/2022   Glaucoma 08/31/2022   Loss of appetite 08/31/2022   Mitral valve regurgitation 08/31/2022   Fatigue 08/31/2022   Palpitations 03/12/2022   Thrombocytopenia 11/25/2021   Retinal hemorrhage, right eye 10/12/2021   Snores 10/12/2021   Iron deficiency 09/04/2021   Leg swelling 07/09/2021   Right hip pain 05/13/2021   Acute bilateral low back pain with right-sided sciatica 05/13/2021   Posterior vitreous detachment of both eyes 02/25/2021   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   Anxiety and depression 08/14/2020   Morbid obesity (HCC) 05/01/2020   Diverticular disease of colon 05/01/2020   Chronic idiopathic constipation 05/01/2020   Decreased vision of left eye 02/28/2020   Elevated blood pressure reading in office without diagnosis of hypertension 12/15/2018   Blindness 09/14/2018   GERD (gastroesophageal reflux disease) 04/21/2018   TMJ pain dysfunction syndrome 03/23/2018   Pulsatile tinnitus of both ears 03/23/2018   Chronic post-traumatic stress disorder (PTSD) 03/14/2018   Routine general medical examination at a health care facility 03/14/2018   Abnormal auditory perception of both ears 06/03/2016   Neck pain 03/12/2016   Abnormality of gait 06/03/2015  Chronic left SI joint pain 06/03/2015   Chronic insomnia 10/11/2014   DJD (degenerative joint disease) of knee 03/13/2014   Sensorineural hearing loss, bilateral 11/27/2013   Home Medication(s) Prior to Admission medications   Medication Sig Start Date End Date Taking? Authorizing Provider  albuterol  (VENTOLIN  HFA) 108 (90 Base) MCG/ACT inhaler Inhale 2 puffs into the lungs every 6 (six) hours as needed for wheezing or shortness of breath. Patient not taking: Reported on 08/20/2024 04/20/24   Rollene Almarie LABOR, MD  ALPRAZolam   (XANAX ) 1 MG tablet Take 1 tablet (1 mg total) by mouth 3 (three) times daily. 08/15/24   Rollene Almarie LABOR, MD  carboxymethylcellulose (REFRESH PLUS) 0.5 % SOLN Place 1 drop into both eyes 3 (three) times daily as needed (dry eyes).    [provider]  esomeprazole  (NEXIUM ) 40 MG capsule Take 1 capsule (40 mg total) by mouth 2 (two) times daily before a meal. 12/26/23   Rollene Almarie LABOR, MD  Magnesium  250 MG TABS Take 1 tablet by mouth daily.    [provider]  ondansetron  (ZOFRAN  ODT) 4 MG disintegrating tablet 4mg  ODT q4 hours prn nausea/vomit Patient not taking: Reported on 08/20/2024 08/13/21   Zammit, Joseph, MD  sucralfate  (CARAFATE ) 1 GM/10ML suspension TAKE BY MOUTH 3 TIMES A DAY AS NEEDED FOR ACID REFLUX 09/17/24   Rollene Almarie LABOR, MD  zolpidem  (AMBIEN ) 10 MG tablet Take 1 tablet (10 mg total) by mouth at bedtime as needed. for sleep 09/24/24   Rollene Almarie LABOR, MD                                                                                                                                    Past Surgical History Past Surgical History:  Procedure Laterality Date   ABDOMINAL HYSTERECTOMY     APPENDECTOMY     COLONOSCOPY     ESOPHAGOGASTRODUODENOSCOPY (EGD) WITH PROPOFOL  N/A 07/29/2021   Procedure: ESOPHAGOGASTRODUODENOSCOPY (EGD) WITH PROPOFOL ;  Surgeon: Kristie Lamprey, MD;  Location: [facility];  Service: Endoscopy;  Laterality: N/A;   HEMOSTASIS CLIP PLACEMENT  07/29/2021   Procedure: HEMOSTASIS CLIP PLACEMENT;  Surgeon: Kristie Lamprey, MD;  Location: [facility];  Service: Endoscopy;;   IR RADIOLOGIST EVAL & MGMT  06/08/2017   POLYPECTOMY  07/29/2021   Procedure: POLYPECTOMY;  Surgeon: Kristie Lamprey, MD;  Location: [facility];  Service: Endoscopy;;   tinnitus Right    TONSILLECTOMY     TOTAL KNEE ARTHROPLASTY Right 03/13/2014   DR MURPHY   TOTAL KNEE ARTHROPLASTY Right 03/13/2014   Procedure: TOTAL KNEE ARTHROPLASTY;  Surgeon: Toribio JULIANNA Chancy,  MD;  Location: [facility];  Service: Orthopedics;  Laterality: Right;   TUBAL LIGATION     Family History Family History  Problem Relation Age of Onset   Depression Mother    Cancer - Other Father    Kidney disease Father    Cancer - Other Sister  breast ca   Cancer - Other Brother        lymphoma in remission   Lymphoma Brother    Cancer - Other Sister        in remission breast ca    Social History Social History   Tobacco Use   Smoking status: Former    Current packs/day: 0.00    Average packs/day: 1 pack/day for 22.0 years (22.0 ttl pk-yrs)    Types: Cigarettes    Start date: 03/08/1972    Quit date: 01/08/1994    Years since quitting: 30.8   Smokeless tobacco: Never   Tobacco comments:    quit smoking 57yrs ago  Vaping Use   Vaping status: Never Used  Substance Use Topics   Alcohol use: No    Alcohol/week: 0.0 standard drinks of alcohol   Drug use: No   Allergies Iodinated contrast media, Other, Shellfish allergy, Gadolinium derivatives, Escitalopram , Hydrocodone , Magnesium  hydroxide, Metronidazole , Acetazolamide, Gabapentin, and Mirtazapine   Review of Systems A thorough review of systems was obtained and all systems are negative except as noted in the HPI and PMH.   Physical Exam Vital Signs  I have reviewed the triage vital signs BP (!) 159/78   Pulse 70   Temp 97.7 F (36.5 C) (Oral)   Resp (!) 22   Ht 5' 4 (1.626 m)   Wt 95.3 kg   SpO2 96%   BMI 36.05 kg/m  Physical Exam Vitals and nursing note reviewed.  Constitutional:      General: She is not in acute distress.    Appearance: Normal appearance. She is well-developed. She is not ill-appearing.  HENT:     Head: Normocephalic and atraumatic.     Right Ear: External ear normal.     Left Ear: External ear normal.     Nose: Nose normal.     Mouth/Throat:     Mouth: Mucous membranes are moist.  Eyes:     General: No scleral icterus.       Right eye: No discharge.        Left eye: No  discharge.     Extraocular Movements: Extraocular movements intact.     Pupils: Pupils are equal, round, and reactive to light.     Comments: Hints exam consistent with peripheral vertigo  Cardiovascular:     Rate and Rhythm: Normal rate.  Pulmonary:     Effort: Pulmonary effort is normal. No respiratory distress.     Breath sounds: No stridor.  Abdominal:     General: Abdomen is flat. There is no distension.     Palpations: Abdomen is soft.     Tenderness: There is no guarding.  Musculoskeletal:        General: No deformity.     Cervical back: No rigidity.  Skin:    General: Skin is warm and dry.     Coloration: Skin is not cyanotic, jaundiced or pale.  Neurological:     General: No focal deficit present.     Mental Status: She is alert and oriented to person, place, and time.     GCS: GCS eye subscore is 4. GCS verbal subscore is 5. GCS motor subscore is 6.     Cranial Nerves: Cranial nerves 2-12 are intact.     Sensory: Sensation is intact.     Motor: Motor function is intact.     Coordination: Coordination is intact.     Comments: Strength 5/5 to BLUE/BLLE, equal and symmetric  Psychiatric:        Speech: Speech normal.        Behavior: Behavior normal. Behavior is cooperative.     ED Results and Treatments Labs (all labs ordered are listed, but only abnormal results are displayed) Labs Reviewed  CBC - Abnormal; Notable for the following components:      Result Value   Platelets 103 (*)    All other components within normal limits  BASIC METABOLIC PANEL WITH GFR                                                                                                                          Radiology No results found.  Pertinent labs & imaging results that were available during my care of the patient were reviewed by me and considered in my medical decision making (see MDM for details).  Medications Ordered in ED Medications  meclizine  (ANTIVERT ) tablet 25 mg (25 mg  Oral Given 11/03/24 0803)  sodium chloride  0.9 % bolus 1,000 mL (0 mLs Intravenous Stopped 11/03/24 9061)                                                                                                                                     Procedures Procedures  (including critical care time)  Medical Decision Making / ED Course    Medical Decision Making:    Gabrielle [name] is a 73 y.o. female with past medical history as below, significant for anxiety, intermittent dizziness, glaucoma, hypertension, chronic constipation who presents to the ED with complaint of dizziness. The complaint involves an extensive differential diagnosis and also carries with it a high risk of complications and morbidity.  Serious etiology was considered. Ddx includes but is not limited to: Peripheral vertigo, central vertigo, posterior stroke, sinusitis, dehydration, electrolyte derangement, etc.  Complete initial physical exam performed, notably the patient was in no acute distress.    Reviewed and confirmed nursing documentation for past medical history, family history, social history.  Vital signs reviewed.    Vertigo Dizziness > - Patient with positional, spinning sensation.  Sudden onset this morning.  Has happened in the past.  Denies formal diagnosis of vertigo.  She is neurologically intact.  She has fatigable horizontal nystagmus.  Hints exam is consistent with a peripheral vertigo - Check screening labs > stable - Antivert , fluids >> greatly improved  - Feeling much better, dizziness  has nearly resolved, she is ambulatory that assistance to the restroom, no gait disturbance, no weakness.  Remains neuro intact   Clinical Course as of 11/03/24 1018  Sat Nov 03, 2024  9143 Symptoms improved  [SG]    Clinical Course User Index [SG] Elnor Jayson LABOR, DO    Patient presents with vertigo. On initial evaluation patient appears in no acute distress, afebrile with normal vital signs. Vertigo most suggestive  of peripheral cause. Neuro intact without sign of CNS ischemia or other serious etiology. DC on Meclizine  with close PCP F/U. Warnings discussed.   Patient in no distress and overall condition improved here in the ED. Detailed discussions were had with the patient regarding current findings, and need for close f/u with PCP or on call doctor. The patient has been instructed to return immediately if the symptoms worsen in any way for re-evaluation. Patient verbalized understanding and is in agreement with current care plan. All questions answered prior to discharge.               Additional history obtained: -Additional history obtained from family -External records from outside source obtained and reviewed including: Chart review including previous notes, labs, imaging, consultation notes including  Home meds   Lab Tests: -I ordered, reviewed, and interpreted labs.   The pertinent results include:   Labs Reviewed  CBC - Abnormal; Notable for the following components:      Result Value   Platelets 103 (*)    All other components within normal limits  BASIC METABOLIC PANEL WITH GFR    Notable for thrombocytopenia   EKG   EKG Interpretation Date/Time:  Saturday November 03 2024 07:54:32 EST Ventricular Rate:  60 PR Interval:  164 QRS Duration:  86 QT Interval:  436 QTC Calculation: 436 R Axis:   53  Text Interpretation: Sinus rhythm Confirmed by Elnor Jayson ([id number]) on 11/03/2024 8:07:58 AM         Imaging Studies ordered: na   Medicines ordered and prescription drug management: Meds ordered this encounter  Medications   meclizine  (ANTIVERT ) tablet 25 mg   sodium chloride  0.9 % bolus 1,000 mL    -I have reviewed the patients home medicines and have made adjustments as needed   Consultations Obtained: na   Cardiac Monitoring: Continuous pulse oximetry interpreted by myself, 98% on RA.    Social Determinants of Health:  Diagnosis or treatment  significantly limited by social determinants of health: former smoker and obesity   Reevaluation: After the interventions noted above, I reevaluated the patient and found that they have improved  Co morbidities that complicate the patient evaluation  Past Medical History:  Diagnosis Date   Anemia    as a child   Anxiety    takes Xanax  daily   Arthritis    Cataracts, bilateral    immature   Chronic insomnia 10/11/2014   Complication of anesthesia    Constipation    will occasionally take Milk of Mag   Diverticulosis    Dizziness    rarely   GERD (gastroesophageal reflux disease)    takes Omeprazole  every other day   Glaucoma    borderline and no drops required   Hearing loss    but doesn't have hearing aids   History of bronchitis    many many yrs ago   History of colitis    History of colon polyps    Hypertension    hasn't been on meds for the past 31yrs  Insomnia    takes Trazodone  nightly as needed and Ambien  nightly    Insomnia due to substance 10/11/2014   Joint pain    Joint swelling    PONV (postoperative nausea and vomiting)    Stroke (HCC)    Tinnitus    takes HCTZ daily to decrease pressure in ears      Dispostion: Disposition decision including need for hospitalization was considered, and patient discharged from emergency department.    Final Clinical Impression(s) / ED Diagnoses Final diagnoses:  Peripheral vertigo, unspecified laterality        Elnor Jayson LABOR, DO 11/03/24 1018    Elnor Jayson A, DO 11/03/24 1022

## 2024-11-03 NOTE — ED Triage Notes (Signed)
 POV Pt reports HA and dizziness that started yesterday. I feel like I'm going to pas out. Hx of tinnitus. Sees ENT Q3 months, last seen two weeks ago

## 2024-11-05 ENCOUNTER — Telehealth: Payer: Self-pay

## 2024-11-05 ENCOUNTER — Ambulatory Visit (INDEPENDENT_AMBULATORY_CARE_PROVIDER_SITE_OTHER): Admitting: Internal Medicine

## 2024-11-05 ENCOUNTER — Encounter: Payer: Self-pay | Admitting: Internal Medicine

## 2024-11-05 VITALS — BP 126/70 | HR 57 | Temp 97.4°F | Ht 64.0 in | Wt 227.0 lb

## 2024-11-05 DIAGNOSIS — F419 Anxiety disorder, unspecified: Secondary | ICD-10-CM

## 2024-11-05 DIAGNOSIS — R42 Dizziness and giddiness: Secondary | ICD-10-CM | POA: Diagnosis not present

## 2024-11-05 DIAGNOSIS — F32A Depression, unspecified: Secondary | ICD-10-CM | POA: Diagnosis not present

## 2024-11-05 DIAGNOSIS — F4312 Post-traumatic stress disorder, chronic: Secondary | ICD-10-CM

## 2024-11-05 NOTE — Patient Instructions (Signed)
 We will do the letter and let you know when it is done.
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
## 2024-11-05 NOTE — Telephone Encounter (Signed)
 Copied from CRM [id number]. Topic: General - Other >> Nov 05, 2024  3:13 PM Robinson H wrote: Reason for CRM: Patient wants to speak with provider or assistance regarding [employer] letter for [facility], states she wants to make provider aware of a few thinks she left out before letter goes out.  Inza [phone]

## 2024-11-06 NOTE — Telephone Encounter (Signed)
 Spoke with patient. Wanted to make Dr. Rollene aware that , due to her PTSD, she has no desire to go out of the house. There are several days where she will not go anywhere and if she does is very anxious to return home. Patient has a particular spot on her couch which she does not move from and often time does not recall doing any self care such as showering. Pt would like this information to be included in her [employer] letter for the [place].

## 2024-11-12 ENCOUNTER — Encounter: Payer: Self-pay | Admitting: Internal Medicine

## 2024-11-12 NOTE — Telephone Encounter (Signed)
 This letter is done now and mychart message sent to patient to inform. She will let us  know if she wants a signed copy

## 2024-11-14 DIAGNOSIS — H401111 Primary open-angle glaucoma, right eye, mild stage: Secondary | ICD-10-CM | POA: Diagnosis not present

## 2024-11-14 DIAGNOSIS — H16212 Exposure keratoconjunctivitis, left eye: Secondary | ICD-10-CM | POA: Diagnosis not present

## 2024-11-14 DIAGNOSIS — H47012 Ischemic optic neuropathy, left eye: Secondary | ICD-10-CM | POA: Diagnosis not present

## 2024-11-14 DIAGNOSIS — H40052 Ocular hypertension, left eye: Secondary | ICD-10-CM | POA: Diagnosis not present

## 2024-11-16 ENCOUNTER — Telehealth: Payer: Self-pay

## 2024-11-16 NOTE — Telephone Encounter (Signed)
 Message sent to Dr. Rollene. Awaiting response. Will call pt with updated information once received.

## 2024-11-16 NOTE — Telephone Encounter (Signed)
 Letter to [employer] has been printed and a copy is at the front desk for pick up as well as a copy mailed at pt's request.

## 2024-11-16 NOTE — Telephone Encounter (Signed)
 Pt would like to know about using Buspar  for a wt loss medication. Someone told her about it and wanted to know. Explained this medication is usually used for anxiety but I told her that I would ask. Pt would also like to know if wt loss medication would affect her eye pressure??  Please advise.

## 2024-11-16 NOTE — Telephone Encounter (Signed)
 Copied from CRM #[id number]. Topic: General - Other >> Nov 16, 2024  9:42 AM Drema MATSU wrote: Reason for CRM: Patient wants to know if provider has put her [employer] letter together. She wants if it has been mailed yet or if not can she come and pick it up if its done.

## 2024-11-16 NOTE — Telephone Encounter (Signed)
 Copied from CRM #[id number]. Topic: Clinical - Medication Question >> Nov 16, 2024  9:42 AM Drema MATSU wrote: Reason for CRM: Patient is requesting a callback from nurse regarding weight loss medication. She states that she has questions about it.

## 2024-11-20 ENCOUNTER — Telehealth: Payer: Self-pay

## 2024-11-20 ENCOUNTER — Ambulatory Visit

## 2024-11-20 ENCOUNTER — Encounter: Admitting: Internal Medicine

## 2024-11-20 NOTE — Telephone Encounter (Signed)
 Copied from CRM [id number]. Topic: Clinical - Medication Question >> Nov 20, 2024  9:07 AM Revonda D wrote: Reason for CRM: Pt stated that her insurance is stating that they won't cover the sucralfate  (CARAFATE ) 1 GM/10ML suspension unless her PCP can send an override for non formulary drugs. Pt stated that she needs Dr.Crawford to inform them that the medication is required due to her disease. Pt stated that she also wants to speak with the nurse in regards to the buspirone . Pt would like a callback today if possible.

## 2024-11-20 NOTE — Telephone Encounter (Signed)
 Pt is aware.

## 2024-11-21 NOTE — Telephone Encounter (Signed)
 Pt is aware. Will reach out to insurance company to see if this can be resolved. Advised to give us  a call back if we can be of further assistance.

## 2024-11-21 NOTE — Telephone Encounter (Signed)
 Typically for a formulary exception she would need to initiate this herself with the insurance company

## 2024-11-27 ENCOUNTER — Telehealth: Payer: Self-pay

## 2024-11-27 NOTE — Telephone Encounter (Signed)
 Copied from CRM #[id number]. Topic: Clinical - Medication Question >> Nov 20, 2024 10:06 AM [name] wrote: Reason for CRM: Pt calling back after call dropped to speak with nurse. Per CAL, nurse was on a call. Please return call to patient at [phone].

## 2024-11-29 NOTE — Telephone Encounter (Signed)
Issue has been addressed.

## 2024-11-30 ENCOUNTER — Ambulatory Visit: Admitting: Gastroenterology

## 2025-01-03 ENCOUNTER — Ambulatory Visit (INDEPENDENT_AMBULATORY_CARE_PROVIDER_SITE_OTHER)

## 2025-01-03 VITALS — Ht 64.0 in | Wt 225.0 lb

## 2025-01-03 DIAGNOSIS — Z0000 Encounter for general adult medical examination without abnormal findings: Secondary | ICD-10-CM | POA: Diagnosis not present

## 2025-01-03 NOTE — Patient Instructions (Signed)
 Ms. Fowers,  Thank you for taking the time for your Medicare Wellness Visit. I appreciate your continued commitment to your health goals. Please review the care plan we discussed, and feel free to reach out if I can assist you further.  Please note that Annual Wellness Visits do not include a physical exam. Some assessments may be limited, especially if the visit was conducted virtually. If needed, we may recommend an in-person follow-up with your provider.  Ongoing Care Seeing your primary care provider every 3 to 6 months helps us  monitor your health and provide consistent, personalized care.   Referrals If a referral was made during today's visit and you haven't received any updates within two weeks, please contact the referred provider directly to check on the status.  Recommended Screenings:  Health Maintenance  Topic Date Due   Hepatitis C Screening  Never done   Pneumococcal Vaccine for age over 93 (1 of 2 - PCV) Never done   DTaP/Tdap/Td vaccine (2 - Td or Tdap) 12/28/2023   COVID-19 Vaccine (4 - 2025-26 season) 08/27/2024   Medicare Annual Wellness Visit  11/14/2024   Breast Cancer Screening  05/16/2026   Colon Cancer Screening  11/08/2033   Flu Shot  Completed   Osteoporosis screening with Bone Density Scan  Completed   Meningitis B Vaccine  Aged Out   Zoster (Shingles) Vaccine  Discontinued       01/03/2025   10:19 AM  Advanced Directives  Does Patient Have a Medical Advance Directive? Yes  Type of Advance Directive Healthcare Power of Attorney  Does patient want to make changes to medical advance directive? No - Patient declined  Copy of Healthcare Power of Attorney in Chart? No - copy requested    Vision: Annual vision screenings are recommended for early detection of glaucoma, cataracts, and diabetic retinopathy. These exams can also reveal signs of chronic conditions such as diabetes and high blood pressure.  Dental: Annual dental screenings help detect early  signs of oral cancer, gum disease, and other conditions linked to overall health, including heart disease and diabetes.  Please see the attached documents for additional preventive care recommendations.

## 2025-01-03 NOTE — Progress Notes (Signed)
 "  Chief Complaint  Patient presents with   Medicare Wellness     Subjective:   Gabrielle [name] is a 74 y.o. female who presents for a Medicare Annual Wellness Visit.  Visit info / Clinical Intake: Medicare Wellness Visit Type:: Subsequent Annual Wellness Visit Persons participating in visit and providing information:: patient Medicare Wellness Visit Mode:: Telephone If telephone:: video declined Since this visit was completed virtually, some vitals may be partially provided or unavailable. Missing vitals are due to the limitations of the virtual format.: Documented vitals are patient reported If Telephone or Video please confirm:: I connected with patient using audio/video enable telemedicine. I verified patient identity with two identifiers, discussed telehealth limitations, and patient agreed to proceed. Patient Location:: home Provider Location:: home office Interpreter Needed?: No Pre-visit prep was completed: yes AWV questionnaire completed by patient prior to visit?: no Living arrangements:: (!) lives alone Patient's Overall Health Status Rating: (!) fair Typical amount of pain: some Does pain affect daily life?: (!) yes Are you currently prescribed opioids?: no  Dietary Habits and Nutritional Risks How many meals a day?: (!) 1 Eats fruit and vegetables daily?: yes (has a smoothie in the morning) Most meals are obtained by: having others provide food In the last 2 weeks, have you had any of the following?: (!) nausea, vomiting, diarrhea (nausea sometimes) Diabetic:: no  Functional Status Activities of Daily Living (to include ambulation/medication): (!) Needs Assist Feeding: Independent Dressing/Grooming: Needs assistance (sister helps) Bathing: Needs assistance (sister helps) Toileting: Independent Transfer: Independent Ambulation: Independent with device- listed below Home Assistive Devices/Equipment: Cane Medication Administration: Needs assistance (comment)  (sister helps) Home Management (perform basic housework or laundry): Needs assistance (comment) (sister helps) Manage your own finances?: yes Primary transportation is: family / friends Concerns about vision?: (!) yes (legally blind) Concerns about hearing?: (!) yes (has tinnitus) Uses hearing aids?: (!) yes  Fall Screening Falls in the past year?: 0 Number of falls in past year: 0 Was there an injury with Fall?: 0 Fall Risk Category Calculator: 0 Patient Fall Risk Level: Low Fall Risk  Fall Risk Patient at Risk for Falls Due to: Impaired balance/gait; Impaired mobility; Impaired vision; Medication side effect Fall risk Follow up: Falls evaluation completed; Falls prevention discussed  Home and Transportation Safety: All rugs have non-skid backing?: N/A, no rugs All stairs or steps have railings?: N/A, no stairs Grab bars in the bathtub or shower?: yes Have non-skid surface in bathtub or shower?: yes Good home lighting?: yes Regular seat belt use?: yes Hospital stays in the last year:: no  Cognitive Assessment Difficulty concentrating, remembering, or making decisions? : yes Will 6CIT or Mini Cog be Completed: yes What year is it?: 0 points What month is it?: 0 points Give patient an address phrase to remember (5 components): [address] [place] About what time is it?: 0 points Count backwards from 20 to 1: 0 points Say the months of the year in reverse: 0 points Repeat the address phrase from earlier: 0 points 6 CIT Score: 0 points  Advance Directives (For Healthcare) Does Patient Have a Medical Advance Directive?: Yes Does patient want to make changes to medical advance directive?: No - Patient declined Type of Advance Directive: Healthcare Power of Attorney Copy of Healthcare Power of Attorney in Chart?: No - copy requested  Reviewed/Updated  Reviewed/Updated: Reviewed All (Medical, Surgical, Family, Medications, Allergies, Care Teams, Patient  Goals)    Allergies (verified) Iodinated contrast media, Other, Shellfish allergy, Gadolinium derivatives, Escitalopram ,  Hydrocodone , Magnesium  hydroxide, Metronidazole , Acetazolamide, Gabapentin, and Mirtazapine    Current Medications (verified) Outpatient Encounter Medications as of 01/03/2025  Medication Sig   ALPRAZolam  (XANAX ) 1 MG tablet Take 1 tablet (1 mg total) by mouth 3 (three) times daily.   carboxymethylcellulose (REFRESH PLUS) 0.5 % SOLN Place 1 drop into both eyes 3 (three) times daily as needed (dry eyes).   esomeprazole  (NEXIUM ) 40 MG capsule Take 1 capsule (40 mg total) by mouth 2 (two) times daily before a meal.   Magnesium  250 MG TABS Take 1 tablet by mouth daily.   polyethylene glycol powder (GLYCOLAX/MIRALAX) 17 GM/SCOOP powder Take 17 g by mouth as needed. Dissolve 1 capful (17g) in 4-8 ounces of liquid and take by mouth daily.   sucralfate  (CARAFATE ) 1 GM/10ML suspension TAKE 10ML BY MOUTH 3 TIMES A DAY AS NEEDED FOR ACID REFLUX   zolpidem  (AMBIEN ) 10 MG tablet Take 1 tablet (10 mg total) by mouth at bedtime as needed. for sleep   albuterol  (VENTOLIN  HFA) 108 (90 Base) MCG/ACT inhaler Inhale 2 puffs into the lungs every 6 (six) hours as needed for wheezing or shortness of breath. (Patient not taking: Reported on 01/03/2025)   meclizine  (ANTIVERT ) 25 MG tablet Take 1 tablet (25 mg total) by mouth 2 (two) times daily as needed for dizziness.   ondansetron  (ZOFRAN  ODT) 4 MG disintegrating tablet 4mg  ODT q4 hours prn nausea/vomit (Patient not taking: Reported on 11/05/2024)   No facility-administered encounter medications on file as of 01/03/2025.    History: Past Medical History:  Diagnosis Date   Anemia    as a child   Anxiety    takes Xanax  daily   Arthritis    Cataracts, bilateral    immature   Chronic insomnia 10/11/2014   Complication of anesthesia    Constipation    will occasionally take Milk of Mag   Diverticulosis    Dizziness    rarely   GERD  (gastroesophageal reflux disease)    takes Omeprazole  every other day   Glaucoma    borderline and no drops required   Hearing loss    but doesn't have hearing aids   History of bronchitis    many many yrs ago   History of colitis    History of colon polyps    Hypertension    hasn't been on meds for the past 30yrs    Insomnia    takes Trazodone  nightly as needed and Ambien  nightly    Insomnia due to substance 10/11/2014   Joint pain    Joint swelling    PONV (postoperative nausea and vomiting)    Stroke (HCC)    Tinnitus    takes HCTZ daily to decrease pressure in ears   Past Surgical History:  Procedure Laterality Date   ABDOMINAL HYSTERECTOMY     APPENDECTOMY     COLONOSCOPY     ESOPHAGOGASTRODUODENOSCOPY (EGD) WITH PROPOFOL  N/A 07/29/2021   Procedure: ESOPHAGOGASTRODUODENOSCOPY (EGD) WITH PROPOFOL ;  Surgeon: Kristie Lamprey, MD;  Location: [facility];  Service: Endoscopy;  Laterality: N/A;   HEMOSTASIS CLIP PLACEMENT  07/29/2021   Procedure: HEMOSTASIS CLIP PLACEMENT;  Surgeon: Kristie Lamprey, MD;  Location: [facility];  Service: Endoscopy;;   IR RADIOLOGIST EVAL & MGMT  06/08/2017   POLYPECTOMY  07/29/2021   Procedure: POLYPECTOMY;  Surgeon: Kristie Lamprey, MD;  Location: [facility];  Service: Endoscopy;;   tinnitus Right    TONSILLECTOMY     TOTAL KNEE ARTHROPLASTY Right 03/13/2014   DR BEVERLEY  TOTAL KNEE ARTHROPLASTY Right 03/13/2014   Procedure: TOTAL KNEE ARTHROPLASTY;  Surgeon: Toribio JULIANNA Chancy, MD;  Location: [facility];  Service: Orthopedics;  Laterality: Right;   TUBAL LIGATION     Family History  Problem Relation Age of Onset   Depression Mother    Cancer - Other Father    Kidney disease Father    Cancer - Other Sister        breast ca   Cancer - Other Brother        lymphoma in remission   Lymphoma Brother    Cancer - Other Sister        in remission breast ca   Social History   Occupational History   Occupation: retired  Tobacco Use   Smoking status: Former     Current packs/day: 0.00    Average packs/day: 1 pack/day for 22.0 years (22.0 ttl pk-yrs)    Types: Cigarettes    Start date: 03/08/1972    Quit date: 01/08/1994    Years since quitting: 31.0   Smokeless tobacco: Never   Tobacco comments:    quit smoking 23yrs ago  Vaping Use   Vaping status: Never Used  Substance and Sexual Activity   Alcohol use: No    Alcohol/week: 0.0 standard drinks of alcohol   Drug use: No   Sexual activity: Not Currently    Birth control/protection: Surgical   Tobacco Counseling Counseling given: Not Answered Tobacco comments: quit smoking 9yrs ago  SDOH Screenings   Food Insecurity: No Food Insecurity (01/03/2025)  Housing: Unknown (01/03/2025)  Transportation Needs: No Transportation Needs (01/03/2025)  Utilities: Not At Risk (01/03/2025)  Alcohol Screen: Low Risk (01/03/2025)  Depression (PHQ2-9): Medium Risk (01/03/2025)  Financial Resource Strain: Low Risk (01/03/2025)  Physical Activity: Inactive (01/03/2025)  Social Connections: Moderately Isolated (01/03/2025)  Stress: Stress Concern Present (01/03/2025)  Tobacco Use: Medium Risk (01/03/2025)  Health Literacy: Adequate Health Literacy (01/03/2025)   See flowsheets for full screening details  Depression Screen PHQ 2 & 9 Depression Scale- Over the past 2 weeks, how often have you been bothered by any of the following problems? Little interest or pleasure in doing things: 2 Feeling down, depressed, or hopeless (PHQ Adolescent also includes...irritable): 1 PHQ-2 Total Score: 3 Trouble falling or staying asleep, or sleeping too much: 0 Feeling tired or having little energy: 1 Poor appetite or overeating (PHQ Adolescent also includes...weight loss): 1 Feeling bad about yourself - or that you are a failure or have let yourself or your family down: 3 Trouble concentrating on things, such as reading the newspaper or watching television (PHQ Adolescent also includes...like school work): 0 Moving or speaking so  slowly that other people could have noticed. Or the opposite - being so fidgety or restless that you have been moving around a lot more than usual: 0 Thoughts that you would be better off dead, or of hurting yourself in some way: 0 PHQ-9 Total Score: 8 If you checked off any problems, how difficult have these problems made it for you to do your work, take care of things at home, or get along with other people?: Somewhat difficult  Depression Treatment Depression Interventions/Treatment : Currently on Treatment (has a veterinary surgeon)     Goals Addressed             This Visit's Progress    Patient Stated       01/03/2025, wants to lose weight             Objective:  Today's Vitals   01/03/25 1009  Weight: 225 lb (102.1 kg)  Height: 5' 4 (1.626 m)   Body mass index is 38.62 kg/m.  Hearing/Vision screen Hearing Screening - Comments:: Has hearing aids that are maintained Vision Screening - Comments:: Regular eye exams, Groat Immunizations and Health Maintenance Health Maintenance  Topic Date Due   Hepatitis C Screening  Never done   Pneumococcal Vaccine: 50+ Years (1 of 2 - PCV) Never done   DTaP/Tdap/Td (2 - Td or Tdap) 12/28/2023   COVID-19 Vaccine (4 - 2025-26 season) 08/27/2024   Medicare Annual Wellness (AWV)  01/03/2026   Mammogram  05/16/2026   Colonoscopy  11/08/2033   Influenza Vaccine  Completed   Bone Density Scan  Completed   Meningococcal B Vaccine  Aged Out   Zoster Vaccines- Shingrix  Discontinued        Assessment/Plan:  This is a routine wellness examination for Hadiya.  Patient Care Team: Rollene Almarie LABOR, MD as PCP - General (Internal Medicine) Skeet Juliene SAUNDERS, DO as Consulting Physician (Neurology) Skeet Juliene SAUNDERS, DO as Consulting Physician (Neurology) Octavia Bruckner, MD as Consulting Physician (Ophthalmology) Gospe, Donna Meadows III, MD as Referring Physician (Ophthalmology) Fate Morna SAILOR, [facility] (Inactive) as Pharmacist  (Pharmacist) Elner Arley LABOR, MD as Consulting Physician (Ophthalmology) [facility]  I have personally reviewed and noted the following in the patients chart:   Medical and social history Use of alcohol, tobacco or illicit drugs  Current medications and supplements including opioid prescriptions. Functional ability and status Nutritional status Physical activity Advanced directives List of other physicians Hospitalizations, surgeries, and ER visits in previous 12 months Vitals Screenings to include cognitive, depression, and falls Referrals and appointments  No orders of the defined types were placed in this encounter.  In addition, I have reviewed and discussed with patient certain preventive protocols, quality metrics, and best practice recommendations. A written personalized care plan for preventive services as well as general preventive health recommendations were provided to patient.   Ardella FORBES Dawn, LPN   07/27/7972   Return in 1 year (on 01/03/2026).  After Visit Summary: (Pick Up) Due to this being a telephonic visit, with patients personalized plan was offered to patient and patient has requested to Pick up at office.  Nurse Notes: Patient advised to keep follow-up appointment with PCP (02/26/2025) Vaccines not given: covid declined today HM Addressed: Vaccines Due: TDAP and pneumonia Labs Due Hep C screening  "

## 2025-01-19 ENCOUNTER — Other Ambulatory Visit: Payer: Self-pay | Admitting: Internal Medicine

## 2025-01-21 ENCOUNTER — Other Ambulatory Visit: Payer: Self-pay

## 2025-01-21 ENCOUNTER — Other Ambulatory Visit: Payer: Self-pay | Admitting: Internal Medicine

## 2025-01-21 DIAGNOSIS — F4312 Post-traumatic stress disorder, chronic: Secondary | ICD-10-CM

## 2025-01-21 NOTE — Telephone Encounter (Unsigned)
 Copied from CRM #[id number]. Topic: Clinical - Medication Refill >> Jan 21, 2025  2:25 PM [name] wrote: Medication:  sucralfate  (CARAFATE ) 1 GM/10ML suspension   Has the patient contacted their pharmacy? Yes (Agent: If no, request that the patient contact the pharmacy for the refill. If patient does not wish to contact the pharmacy document the reason why and proceed with request.) (Agent: If yes, when and what did the pharmacy advise?)  This is the patient's preferred pharmacy:  [id number] - [place], [place] - [address] AT [facility] [address]  [place] [address] Phone: [phone] Fax: [phone]    Is this the correct pharmacy for this prescription? Yes If no, delete pharmacy and type the correct one.   Has the prescription been filled recently? Yes  Is the patient out of the medication? Yes  Has the patient been seen for an appointment in the last year OR does the patient have an upcoming appointment? Yes  Can we respond through MyChart? Yes  Agent: Please be advised that Rx refills may take up to 3 business days. We ask that you follow-up with your pharmacy.

## 2025-01-21 NOTE — Telephone Encounter (Signed)
 LOV: 11/05/24 Last fill: 08/15/24, 90 tablet 5 refill

## 2025-01-21 NOTE — Telephone Encounter (Signed)
 Copied from CRM [id number]. Topic: Clinical - Medication Refill >> Jan 21, 2025  9:25 AM Gabrielle [name] wrote: Medication:  ALPRAZolam  (XANAX ) 1 MG tablet    Has the patient contacted their pharmacy? No (Agent: If no, request that the patient contact the pharmacy for the refill. If patient does not wish to contact the pharmacy document the reason why and proceed with request.) (Agent: If yes, when and what did the pharmacy advise?)  This is the patient's preferred pharmacy:  [id number] - [place], [place] - [address] AT [facility] [address] [place] [address] Phone: [phone] Fax: [phone]    Is this the correct pharmacy for this prescription? Yes If no, delete pharmacy and type the correct one.   Has the prescription been filled recently? No  Is the patient out of the medication? No  Has the patient been seen for an appointment in the last year OR does the patient have an upcoming appointment? Yes  Can we respond through MyChart? No  Agent: Please be advised that Rx refills may take up to 3 business days. We ask that you follow-up with your pharmacy.

## 2025-01-22 ENCOUNTER — Telehealth: Payer: Self-pay

## 2025-01-22 NOTE — Telephone Encounter (Signed)
 Copied from CRM #[id number]. Topic: Clinical - Medication Refill >> Jan 21, 2025  2:25 PM [name] wrote: Medication:  sucralfate  (CARAFATE ) 1 GM/10ML suspension   Has the patient contacted their pharmacy? Yes (Agent: If no, request that the patient contact the pharmacy for the refill. If patient does not wish to contact the pharmacy document the reason why and proceed with request.) (Agent: If yes, when and what did the pharmacy advise?)  This is the patient's preferred pharmacy:  [id number] - [place], [place] - [address] AT [facility] [address] [place] [address] Phone: [phone] Fax: [phone]    Is this the correct pharmacy for this prescription? Yes If no, delete pharmacy and type the correct one.   Has the prescription been filled recently? Yes  Is the patient out of the medication? Yes  Has the patient been seen for an appointment in the last year OR does the patient have an upcoming appointment? Yes  Can we respond through MyChart? Yes  Agent: Please be advised that Rx refills may take up to 3 business days. We ask that you follow-up with your pharmacy. >> Jan 22, 2025  1:45 PM Macario HERO wrote: Patient called requesting the status of her prescription and also requesting a call back from clinic once prescription is sent to the pharmacy.

## 2025-01-23 ENCOUNTER — Other Ambulatory Visit: Payer: Self-pay | Admitting: Internal Medicine

## 2025-01-23 ENCOUNTER — Telehealth: Payer: Self-pay

## 2025-01-23 ENCOUNTER — Other Ambulatory Visit: Payer: Self-pay

## 2025-01-23 NOTE — Telephone Encounter (Signed)
 Copied from CRM #[id number]. Topic: Clinical - Prescription Issue >> Jan 23, 2025 10:45 AM Robinson H wrote: Reason for CRM: Patient following up on refill request for her sucralfate  (CARAFATE ) 1 GM/10ML suspension request submitted 1/26 note on prescription comments state Notes to Pharmacy: REFILL PLEASE. Patient states she needs the [employer] [phone]

## 2025-01-23 NOTE — Telephone Encounter (Signed)
 Notified pt rx was sent in. Pt asked if there was an approximate time that meds would be ready as she lives in [place]. Called and spoke with pharmacist and informed me that rx was in process and would be ready around 530pm today. Pt was made aware.

## 2025-01-25 NOTE — Telephone Encounter (Signed)
 Addressed in separate telephone encounter

## 2025-02-26 ENCOUNTER — Encounter: Admitting: Internal Medicine

## 2026-01-10 ENCOUNTER — Ambulatory Visit
